# Patient Record
Sex: Female | Born: 1940 | Race: Black or African American | Hispanic: No | Marital: Single | Smoking: Current every day smoker
Health system: Southern US, Community
[De-identification: ages and names within clinical notes are randomized; demographics above are authoritative.]

## PROBLEM LIST (undated history)

## (undated) DIAGNOSIS — N189 Chronic kidney disease, unspecified: Secondary | ICD-10-CM

## (undated) DIAGNOSIS — I10 Essential (primary) hypertension: Secondary | ICD-10-CM

## (undated) DIAGNOSIS — J449 Chronic obstructive pulmonary disease, unspecified: Secondary | ICD-10-CM

## (undated) DIAGNOSIS — I509 Heart failure, unspecified: Secondary | ICD-10-CM

## (undated) DIAGNOSIS — F0390 Unspecified dementia without behavioral disturbance: Secondary | ICD-10-CM

## (undated) DIAGNOSIS — G4700 Insomnia, unspecified: Secondary | ICD-10-CM

## (undated) SURGERY — OPEN REDUCTION INTERNAL FIXATION HIP
Anesthesia: Choice | Laterality: Left

---

## 2000-01-02 ENCOUNTER — Encounter: Payer: Self-pay | Admitting: Orthopedic Surgery

## 2000-01-02 ENCOUNTER — Ambulatory Visit (HOSPITAL_COMMUNITY): Admission: RE | Admit: 2000-01-02 | Discharge: 2000-01-02 | Payer: Self-pay | Admitting: Orthopedic Surgery

## 2000-01-07 ENCOUNTER — Encounter: Payer: Self-pay | Admitting: Orthopedic Surgery

## 2000-01-07 ENCOUNTER — Ambulatory Visit (HOSPITAL_COMMUNITY): Admission: RE | Admit: 2000-01-07 | Discharge: 2000-01-07 | Payer: Self-pay | Admitting: Orthopedic Surgery

## 2000-02-04 ENCOUNTER — Ambulatory Visit (HOSPITAL_BASED_OUTPATIENT_CLINIC_OR_DEPARTMENT_OTHER): Admission: RE | Admit: 2000-02-04 | Discharge: 2000-02-04 | Payer: Self-pay | Admitting: Orthopedic Surgery

## 2000-02-25 ENCOUNTER — Emergency Department (HOSPITAL_COMMUNITY): Admission: EM | Admit: 2000-02-25 | Discharge: 2000-02-25 | Payer: Self-pay | Admitting: Emergency Medicine

## 2000-04-21 ENCOUNTER — Ambulatory Visit (HOSPITAL_BASED_OUTPATIENT_CLINIC_OR_DEPARTMENT_OTHER): Admission: RE | Admit: 2000-04-21 | Discharge: 2000-04-21 | Payer: Self-pay | Admitting: Orthopedic Surgery

## 2000-09-19 ENCOUNTER — Emergency Department (HOSPITAL_COMMUNITY): Admission: EM | Admit: 2000-09-19 | Discharge: 2000-09-19 | Payer: Self-pay | Admitting: Emergency Medicine

## 2001-06-21 ENCOUNTER — Other Ambulatory Visit: Admission: RE | Admit: 2001-06-21 | Discharge: 2001-06-21 | Payer: Self-pay | Admitting: *Deleted

## 2002-03-04 ENCOUNTER — Ambulatory Visit (HOSPITAL_COMMUNITY): Admission: RE | Admit: 2002-03-04 | Discharge: 2002-03-04 | Payer: Self-pay | Admitting: *Deleted

## 2002-03-04 ENCOUNTER — Encounter: Payer: Self-pay | Admitting: *Deleted

## 2003-10-11 ENCOUNTER — Encounter: Admission: RE | Admit: 2003-10-11 | Discharge: 2003-10-11 | Payer: Self-pay | Admitting: Family Medicine

## 2004-01-15 ENCOUNTER — Encounter: Admission: RE | Admit: 2004-01-15 | Discharge: 2004-01-15 | Payer: Self-pay | Admitting: Gastroenterology

## 2004-01-17 ENCOUNTER — Ambulatory Visit (HOSPITAL_COMMUNITY): Admission: RE | Admit: 2004-01-17 | Discharge: 2004-01-17 | Payer: Self-pay | Admitting: Gastroenterology

## 2004-12-12 ENCOUNTER — Ambulatory Visit (HOSPITAL_COMMUNITY): Admission: RE | Admit: 2004-12-12 | Discharge: 2004-12-12 | Payer: Self-pay | Admitting: Gastroenterology

## 2005-01-13 ENCOUNTER — Ambulatory Visit (HOSPITAL_COMMUNITY): Admission: RE | Admit: 2005-01-13 | Discharge: 2005-01-13 | Payer: Self-pay | Admitting: Gastroenterology

## 2005-01-13 ENCOUNTER — Encounter (INDEPENDENT_AMBULATORY_CARE_PROVIDER_SITE_OTHER): Payer: Self-pay | Admitting: *Deleted

## 2009-04-04 ENCOUNTER — Encounter: Admission: RE | Admit: 2009-04-04 | Discharge: 2009-04-04 | Payer: Self-pay | Admitting: Internal Medicine

## 2009-05-12 ENCOUNTER — Emergency Department (HOSPITAL_COMMUNITY): Admission: EM | Admit: 2009-05-12 | Discharge: 2009-05-12 | Payer: Self-pay | Admitting: Emergency Medicine

## 2009-07-08 ENCOUNTER — Emergency Department (HOSPITAL_COMMUNITY): Admission: EM | Admit: 2009-07-08 | Discharge: 2009-07-08 | Payer: Self-pay | Admitting: Emergency Medicine

## 2011-01-09 LAB — CBC
HCT: 40.1 % (ref 36.0–46.0)
Hemoglobin: 13.5 g/dL (ref 12.0–15.0)
MCHC: 33.7 g/dL (ref 30.0–36.0)
MCV: 98.6 fL (ref 78.0–100.0)
Platelets: 228 10*3/uL (ref 150–400)
RBC: 4.07 MIL/uL (ref 3.87–5.11)
RDW: 12.8 % (ref 11.5–15.5)
WBC: 5.2 10*3/uL (ref 4.0–10.5)

## 2011-01-09 LAB — DIFFERENTIAL
Basophils Absolute: 0.1 10*3/uL (ref 0.0–0.1)
Basophils Relative: 1 % (ref 0–1)
Eosinophils Absolute: 0 10*3/uL (ref 0.0–0.7)
Eosinophils Relative: 1 % (ref 0–5)
Lymphocytes Relative: 24 % (ref 12–46)
Lymphs Abs: 1.3 10*3/uL (ref 0.7–4.0)
Monocytes Absolute: 0.5 10*3/uL (ref 0.1–1.0)
Monocytes Relative: 10 % (ref 3–12)
Neutro Abs: 3.3 10*3/uL (ref 1.7–7.7)
Neutrophils Relative %: 65 % (ref 43–77)

## 2011-01-11 LAB — URINALYSIS, ROUTINE W REFLEX MICROSCOPIC
Bilirubin Urine: NEGATIVE
Glucose, UA: NEGATIVE mg/dL
Hgb urine dipstick: NEGATIVE
Ketones, ur: NEGATIVE mg/dL
Nitrite: NEGATIVE
Protein, ur: NEGATIVE mg/dL
Specific Gravity, Urine: 1.017 (ref 1.005–1.030)
Urobilinogen, UA: 0.2 mg/dL (ref 0.0–1.0)
pH: 6 (ref 5.0–8.0)

## 2011-01-11 LAB — BASIC METABOLIC PANEL
BUN: 14 mg/dL (ref 6–23)
CO2: 25 mEq/L (ref 19–32)
Calcium: 9.4 mg/dL (ref 8.4–10.5)
Chloride: 105 mEq/L (ref 96–112)
Creatinine, Ser: 0.98 mg/dL (ref 0.4–1.2)
GFR calc Af Amer: >60 mL/min (ref >60)
GFR calc non Af Amer: 56 mL/min — ABNORMAL LOW (ref >60)
Glucose, Bld: 91 mg/dL (ref 70–99)
Potassium: 4.2 mEq/L (ref 3.5–5.1)
Sodium: 138 mEq/L (ref 135–145)

## 2011-01-11 LAB — DIFFERENTIAL
Basophils Absolute: 0.1 10*3/uL (ref 0.0–0.1)
Basophils Relative: 1 % (ref 0–1)
Eosinophils Absolute: 0.1 10*3/uL (ref 0.0–0.7)
Eosinophils Relative: 1 % (ref 0–5)
Lymphocytes Relative: 22 % (ref 12–46)
Lymphs Abs: 1.4 10*3/uL (ref 0.7–4.0)
Monocytes Absolute: 0.5 10*3/uL (ref 0.1–1.0)
Monocytes Relative: 8 % (ref 3–12)
Neutro Abs: 4.4 10*3/uL (ref 1.7–7.7)
Neutrophils Relative %: 69 % (ref 43–77)

## 2011-01-11 LAB — CBC
HCT: 35.6 % — ABNORMAL LOW (ref 36.0–46.0)
Hemoglobin: 11.8 g/dL — ABNORMAL LOW (ref 12.0–15.0)
MCHC: 33.2 g/dL (ref 30.0–36.0)
MCV: 97.3 fL (ref 78.0–100.0)
Platelets: 259 10*3/uL (ref 150–400)
RBC: 3.66 MIL/uL — ABNORMAL LOW (ref 3.87–5.11)
RDW: 14.1 % (ref 11.5–15.5)
WBC: 6.4 10*3/uL (ref 4.0–10.5)

## 2011-01-11 LAB — SAMPLE TO BLOOD BANK

## 2011-01-11 LAB — HEMOCCULT GUIAC POC 1CARD (OFFICE): Fecal Occult Bld: POSITIVE

## 2011-02-21 NOTE — Op Note (Signed)
[place]  Patient:    Tammy [name], Tammy [name]                    MRN: [id number] Proc. Date: 02/04/00 Adm. Date:  [id number] Disc. Date: [id number] Attending:  Drema Pry                           Operative Report  PREOPERATIVE DIAGNOSIS:  Loss of syndesmosis reduction with mortis widening, status post tightening of mortis on January 07, 2000.  POSTOPERATIVE DIAGNOSIS: Loss of syndesmosis reduction with mortis widening, status post tightening of mortis on January 07, 2000.  OPERATION:  Replacement of cortical syndesmotic screw with reduction of mortis and placement of 4.5 cannulated cancellus screw through four cortices.  SURGEON:  Jearld Adjutant, M.D.  ASSISTANT:  Currie Paris. Thedore Mins.  ANESTHESIA:   General with LMA  CULTURES:  None.  DRAINS:  None.  ESTIMATED BLOOD LOSS:  Minimal  TOURNIQUET TIME:  35 minutes  PATHOLOGIC FINDINGS AND HISTORY:  [place] a trimalleolar ankle fracture with open reduction and internal fixation of the lateral malleolus with a transient osmotic screw done on December 19, 1999 in [place]. We see came to [place] it was apparent that the mortis was not reduced.  CT scan was obtained showing a bone fragment between the distal fibular fracture fragment and the tibia and some bone fragments between the medial talus and the medial malleolus.  We therefore took her back to the operating room here in [place] where she lives and is an old patient of mine on January 07, 2000 where we explored the distal tibia fibular region removing the bone fragment between the tibia and the fibula and also explored medially where the deltoid ligament fragments were, removed several bony fragments medially and some infolded deltoid ligament and then repaired the deltoid ligament back to the medial malleolus with a suture anchor.  This reconstituted the mortis.  In the interim, despite our strong requirement that the patient be nonweight  bearing with a cam walker, she did walk on it and the resultant problem was that the syndesmosis screw loosened and the mortis widened again. Therefore, we elected to take her back one more time to replace the tricortically cortical screw that we had not changed the last time to a 4.5 cannulated cancellus screw through the medial cortex of the tibia and to reconstitute the mortis.  This was what was done today with a 4.5 cannulated cancellus screw through the medial cortex.  When we maximally dorsiflexed the ankle, we did the reduction under C arm fluoroscopy OEC type and felt that we had closed it down.  On the pure AP view there still appeared to be some fragment medially, not apparent on the mortis view.  We had explored this recently and because of our good position on the mortis view we elected to proceed with this reduction and not to re-explore medially and accept this. We also decided to place her in a short leg fiberglass cast to further give good contact. We further admonished her to be nonweight bearing until this heals adequately.  DESCRIPTION OF PROCEDURE:  With adequate anesthesia obtained, using LMA technique, 1 gram Ancef given IV prophylaxis, the patient was placed in the supine position. The left foot was prepped from the toes to the knee in a standard fashion.  After standard prepping and draping, Esmarch exsanguination was used and the tourniquet was  let up to 350 mmHg.  Then under C arm control we made an incision right over the syndesmosis screw and dissected down to it. That screw was removed and then the guide wire was used to go across four cortices out the medial side as well as a drill. We then placed a 58 mm screw across and under C arm fluoroscopy with maximal dorsiflexion felt that we had reclosed the mortis.  We decided not to over tighten to strip it and it did not protrude medially significantly to cause any internal decubitus.  We checked it on the lateral  view to make sure it was in both bones.  When I was satisfied, this wound was irrigated, closed with 3-0 Vicryl and skin staples. Bulky sterile compressive dressing was applied with a short leg fiberglass cast in neutral position.  The patient then having tolerated the procedure well, was awakened, taken to the recovery room in satisfactory condition to be discharged per outpatient routine. Crutches, nonweight bearing on the right. Told to call the office for appointment for recheck on Monday. DD:  02/04/00 TD:  02/06/00 Job: [id number]

## 2011-02-21 NOTE — Op Note (Signed)
[place]  Patient:    Tammy [name], Tammy [name]                    MRN: [id number] Proc. Date: 01/07/00 Adm. Date:  [id number] Attending:  Drema Pry                           Operative Report  PREOPERATIVE DIAGNOSIS:  Status post open reduction internal fixation of bimalleolar component of trimalleolar fracture, right ankle, in [place] with failure of complete mortise reduction.  POSTOPERATIVE DIAGNOSIS:  Status post open reduction internal fixation of bimalleolar component of trimalleolar fracture, right ankle, in [place] with failure of complete mortise reduction.  PROCEDURE: 1. Explore and replace right lateral ankle syndesmosis screw and removal of    interpose bone fragment between tibia and fibula. 2. Explore medial deltoid ligament and removal of enfold of deltoid    ligament, removal of bone fragment, and repair of deltoid with Mitek    anchor.  SURGEON:  Jearld Adjutant, M.D.  ASSISTANT:  Currie Paris. Thedore Mins.  ANESTHESIA:  General with endotracheal.  CULTURES:  None.  DRAINS:  None.  ESTIMATED BLOOD LOSS:  Minimal.  TOURNIQUET TIME:  One hour, 30 minutes.  PATHOLOGIC FINDINGS AND HISTORY:  [name] is a 70 year old female who sustained a closed trimalleolar Pott-type fracture, right ankle, about two-and-a-half weeks ago.  This was in [place].  She was taken to the operating room where she underwent open reduction internal fixation with a front-to-back interfragmentary screw at the fracture site laterally, a seven-hole contoured semi-tubular plate laterally, and a syndesmosis screw. The medial side had some bony fragments but was not explored.  Ultimately, reduction was obtained and accepted, and it was felt that anatomic reduction had been obtained.  She was sent to me and came back home for followup, her being a long-term patient of mine.  I analyzed the ankle and was not happy with the mortise reduction of the talus  to the medial post to the medial malleolus.  There appeared to be a possible interpose bony fragment medially, and therefore, we got a CT scan.  What it revealed was a bone fragment holding out the lateral malleolus from closedown at the syndesmosis as well as probable interpose medial malleolar deltoid and bone fragment.  Therefore, she was taken back to the operating room after much thought and discussion, and we found the bony fragment between the tibia and fibula, removed that, and then squeezed down further, replacing the syndesmosis screw after removing several screws up and down just to get the plate open enough to remove the bone fragment and then replace the syndesmosis screw that we had taken out, tighten it up, and still couldnt get it over medially.  We then made an incision medially, took out end folded deltoid and the bone fragment, and then were able to squeeze down with the syndesmosis and reduced the mortise, bringing the talus to the medial malleolus with anatomic reduction obtained.  We then sutured the deltoid ligament down which was very mushy and friable with a Mitek anchor.  PROCEDURE:  With adequate anesthesia obtained using endotracheal technique, one gram Ancef was given IV prophylaxis and another one at letdown.  The patient was placed in the supine position.  The right lower extremity was prepped from the toes to the knee in a standard fashion.  After standard prepping and draping, Esmarch exsanguination was used.  The  tourniquet was blown up to 350 mmHg and later 400 mmHg.  The old lateral incision was then opened in its mid portion about 5-6 cm.  The incision was deep and sharply divided.  Hemostasis was obtained using the Bovie electrocoagulator. Dissection was carried down to the plate where the syndesmosis screw was removed, two distal screws, and one more proximal.  We then bent the plate up, removed the offending bony fragment that we found, placed the  screw back, and tightened it down, still not reducing the talus completely to the medial malleolus.  We then made a longitudinal incision medially, dissected down to bone, made a longitudinal incision into the deltoid ligament and fished out the bony fragment and some enfolded deltoid down to the medial talus.  Once we had done this, we then went laterally and tightened down the syndesmosis screw again and were able to get the mortise closed anatomically.  C-arm fluoroscopy confirmed positioning.  We did this in ankle dorsiflexion.  Then to repair the deltoid, I used a Mitek anchor and sutured the ligament down.  The lateral plate screws had been replaced by this time, before the closure of the mortise.  We then closed the wound in layers with 2-0 and 3-0 Vicryl after repairing the deltoid ligament with the Mitek anchor and #2 Ethibond.  This was a SuperAnchor Mitek with four prongs.  We then confirmed our position on C-arm with mortise and lateral x-rays and C-arm films, and then closed the wound layers with 2-0 and 3-0 Vicryl on the subcu and 4-0 nylon.  Bulky sterile compressive dressings were applied with posterior splint and new stirrup splint with a mold of the tibia lateral and the lateral border of the foot medial to further act to compress the area.  The patients ankle neutral was placed.  The patient, having tolerated the procedure well, was awakened and taken to the recovery room in satisfactory condition for routine postoperative care. DD:  01/07/00 TD:  01/07/00 Job: [id number]

## 2011-02-21 NOTE — Op Note (Signed)
NAME:  [name], Tammy T                       ACCOUNT NO.:  [id number]   MEDICAL RECORD NO.:  [id number]                   PATIENT TYPE:  AMB   LOCATION:  ENDO                                 FACILITY:  [facility]   PHYSICIAN:  Anselmo Rod, M.D.               DATE OF BIRTH:  01-14-1941   DATE OF PROCEDURE:  01/18/2004  DATE OF DISCHARGE:                                 OPERATIVE REPORT   PROCEDURE PERFORMED:  Colonoscopy.   ENDOSCOPIST:  Anselmo Rod, M.D.   INSTRUMENT USED:  Olympus video colonoscope.   INDICATION FOR PROCEDURE:  A 70 year old African-American female with a  history of rectal bleeding.  Rule out colonic polyps, masses, etc.   PREPROCEDURE PREPARATION:  Informed consent was procured from the patient.  The patient was fasted for eight hours prior to the procedure and prepped  with a bottle of magnesium citrate and a gallon of GoLYTELY the night prior  to the procedure.   PREPROCEDURE PHYSICAL:  VITAL SIGNS:  The patient had stable vital signs.  NECK:  Supple.  CHEST:  Clear to auscultation.  S1, S2 regular.  ABDOMEN:  Soft with normal bowel sounds.   DESCRIPTION OF PROCEDURE:  The patient was placed in the left lateral  decubitus position and sedated with 75 mg of Demerol and 9 mg of Versed  intravenously in slow incremental doses.  Once the patient was adequately  sedate and maintained on low-flow oxygen and continuous cardiac monitoring,  the Olympus video colonoscope was advanced from the rectum to the cecum.  The appendiceal orifice and the ileocecal valve were clearly visualized and  photographed.  The patient had prominent internal hemorrhoids.  There was a  lipoma seen in the mid-right colon.  The rest of the colonic mucosa appeared  healthy, and so did the terminal ileum.  The patient tolerated the procedure  well without immediate complications.   IMPRESSION:  Normal colonoscopy up to the terminal ileum except for a lipoma  in the mid-right colon  and prominent internal hemorrhoids.   RECOMMENDATIONS:  1. Surgical referral is planned for the patient.  2. Continue a high-fiber diet with liberal fluid intake.  3. Outpatient follow-up in the next few weeks for further recommendations.                                               Anselmo Rod, M.D.    JNM/MEDQ  D:  01/18/2004  T:  01/19/2004  Job:  [id number]   cc:   Merlene Laughter. Renae Gloss, M.D.  [address]  [address]  [place]  [place] [address]  Fax: [phone]

## 2011-02-21 NOTE — Op Note (Signed)
NAME:  Tammy [name], Tammy [name]             ACCOUNT NO.:  [id number]   MEDICAL RECORD NO.:  [id number]          PATIENT TYPE:  AMB   LOCATION:  ENDO                         FACILITY:  [facility]   PHYSICIAN:  Anselmo Rod, M.D.  DATE OF BIRTH:  [birth year]   DATE OF PROCEDURE:  01/13/2005  DATE OF DISCHARGE:                                 OPERATIVE REPORT   PROCEDURE PERFORMED:  Colonoscopy with snare polypectomy x 1 and cold  biopsies x 8.   ENDOSCOPIST:  Anselmo Rod, M.D.   INSTRUMENT USED:  Olympus video colonoscope.   INDICATIONS FOR PROCEDURE:  A 70 year old, African-American female with  history rectal bleeding.  Rule out colonic polyps, masses, etc.  Polyps were  seen on a flexible sigmoidoscopy done in the recent past (December 12, 2004).   PRE-PROCEDURE PREPARATION:  Informed consent was procured from the patient.  The patient was fasted for eight hours prior to the procedure and prepped  with a bottle of magnesium citrate and a gallon of GoLYTELY the night prior  to the procedure.  The risks and benefits of the procedure including a 10%  miss rate of cancer and polyps were discussed with her as well.   PRE-PROCEDURE PHYSICAL:  VITAL SIGNS:  The patient had stable vital signs.  NECK:  Supple.  CHEST:  Clear to auscultation.  CARDIOVASCULAR:  S1, S2 regular.  ABDOMEN:  Soft, with normal bowel sounds.   DESCRIPTION OF THE PROCEDURE:  The patient was placed in the left lateral  decubitus position, sedated with 80 mg of Demerol and 8 mg of Versed in slow  incremental doses.  Once the patient was adequately sedated and maintained  on low-flow oxygen and continuous cardiac monitoring, the Olympus video  colonoscope was advanced from the rectum to the cecum.  There was some  residual stool in the colon.  Multiple washings were done.  Prominent  internal hemorrhoids were seen on retroflexion.  A flat polyp was snared  from the rectosigmoid colon.  Five small sessile polyps were  biopsied from  the rectosigmoid colon (cold biopsies).  A small sessile polyp was biopsied  (cold biopsy) from the proximal right colon.  A submucosal yellowish lesion  consistent with a lipoma was seen in the proximal right colon as well.  This  was not biopsied or removed.  The patient tolerated the procedure well,  without immediate complications.   IMPRESSION:  1.  Prominent internal hemorrhoid.  2.  Multiple colonic polyps (see description above), one removed by snare      polypectomy, the rest were done by cold biopsies.  3.  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   RECOMMENDATIONS:  1.  Await pathology results.  2.  Avoid nonsteroidals including aspirin for the next four weeks.  3.  Outpatient followup in the next two weeks for further recommendations.      JNM/MEDQ  D:  01/13/2005  T:  01/13/2005  Job:  [id number]   cc:   Merlene Laughter. Renae Gloss, M.D.  [address]  [address]  [place]  [place] [address]  Fax: [phone]

## 2011-06-20 ENCOUNTER — Emergency Department (HOSPITAL_COMMUNITY)
Admission: EM | Admit: 2011-06-20 | Discharge: 2011-06-21 | Disposition: A | Discharge status: Home / Self Care | Payer: Medicare Other | Attending: Emergency Medicine | Admitting: Emergency Medicine

## 2011-06-20 ENCOUNTER — Emergency Department (HOSPITAL_COMMUNITY): Payer: Medicare Other

## 2011-06-20 DIAGNOSIS — R42 Dizziness and giddiness: Secondary | ICD-10-CM | POA: Insufficient documentation

## 2011-06-20 DIAGNOSIS — G4700 Insomnia, unspecified: Secondary | ICD-10-CM | POA: Insufficient documentation

## 2011-06-20 DIAGNOSIS — R279 Unspecified lack of coordination: Secondary | ICD-10-CM | POA: Insufficient documentation

## 2011-06-20 DIAGNOSIS — F411 Generalized anxiety disorder: Secondary | ICD-10-CM | POA: Insufficient documentation

## 2011-06-20 LAB — BASIC METABOLIC PANEL
BUN: 5 mg/dL — ABNORMAL LOW (ref 6–23)
CO2: 24 mEq/L (ref 19–32)
Calcium: 9.8 mg/dL (ref 8.4–10.5)
Chloride: 95 mEq/L — ABNORMAL LOW (ref 96–112)
Creatinine, Ser: 0.74 mg/dL (ref 0.50–1.10)
GFR calc Af Amer: >60 mL/min (ref >60)
GFR calc non Af Amer: >60 mL/min (ref >60)
Glucose, Bld: 127 mg/dL — ABNORMAL HIGH (ref 70–99)
Potassium: 3.6 mEq/L (ref 3.5–5.1)
Sodium: 131 mEq/L — ABNORMAL LOW (ref 135–145)

## 2011-06-20 LAB — RAPID URINE DRUG SCREEN, HOSP PERFORMED
Amphetamines: NOT DETECTED
Barbiturates: NOT DETECTED
Benzodiazepines: NOT DETECTED
Cocaine: NOT DETECTED
Opiates: NOT DETECTED
Tetrahydrocannabinol: NOT DETECTED

## 2011-06-20 LAB — CBC
HCT: 39 % (ref 36.0–46.0)
Hemoglobin: 13.8 g/dL (ref 12.0–15.0)
MCH: 30.9 pg (ref 26.0–34.0)
MCHC: 35.4 g/dL (ref 30.0–36.0)
MCV: 87.4 fL (ref 78.0–100.0)
Platelets: 286 10*3/uL (ref 150–400)
RBC: 4.46 MIL/uL (ref 3.87–5.11)
RDW: 12.1 % (ref 11.5–15.5)
WBC: 7.3 10*3/uL (ref 4.0–10.5)

## 2011-06-20 LAB — DIFFERENTIAL
Basophils Absolute: 0 10*3/uL (ref 0.0–0.1)
Basophils Relative: 0 % (ref 0–1)
Eosinophils Absolute: 0.1 10*3/uL (ref 0.0–0.7)
Eosinophils Relative: 1 % (ref 0–5)
Lymphocytes Relative: 33 % (ref 12–46)
Lymphs Abs: 2.4 10*3/uL (ref 0.7–4.0)
Monocytes Absolute: 0.8 10*3/uL (ref 0.1–1.0)
Monocytes Relative: 11 % (ref 3–12)
Neutro Abs: 4 10*3/uL (ref 1.7–7.7)
Neutrophils Relative %: 55 % (ref 43–77)

## 2011-06-20 LAB — URINALYSIS, ROUTINE W REFLEX MICROSCOPIC
Bilirubin Urine: NEGATIVE
Glucose, UA: NEGATIVE mg/dL
Hgb urine dipstick: NEGATIVE
Ketones, ur: NEGATIVE mg/dL
Leukocytes, UA: NEGATIVE
Nitrite: NEGATIVE
Protein, ur: NEGATIVE mg/dL
Specific Gravity, Urine: 1.006 (ref 1.005–1.030)
Urobilinogen, UA: 0.2 mg/dL (ref 0.0–1.0)
pH: 7 (ref 5.0–8.0)

## 2011-06-27 ENCOUNTER — Emergency Department (HOSPITAL_COMMUNITY)
Admission: EM | Admit: 2011-06-27 | Discharge: 2011-06-27 | Disposition: A | Discharge status: Home / Self Care | Payer: Medicare Other | Attending: Emergency Medicine | Admitting: Emergency Medicine

## 2011-06-27 ENCOUNTER — Emergency Department (HOSPITAL_COMMUNITY): Payer: Medicare Other

## 2011-06-27 DIAGNOSIS — G4700 Insomnia, unspecified: Secondary | ICD-10-CM | POA: Insufficient documentation

## 2011-06-27 DIAGNOSIS — F411 Generalized anxiety disorder: Secondary | ICD-10-CM | POA: Insufficient documentation

## 2011-06-27 DIAGNOSIS — R42 Dizziness and giddiness: Secondary | ICD-10-CM | POA: Insufficient documentation

## 2011-06-27 DIAGNOSIS — I10 Essential (primary) hypertension: Secondary | ICD-10-CM | POA: Insufficient documentation

## 2011-06-27 LAB — URINALYSIS, ROUTINE W REFLEX MICROSCOPIC
Bilirubin Urine: NEGATIVE
Glucose, UA: NEGATIVE mg/dL
Hgb urine dipstick: NEGATIVE
Ketones, ur: NEGATIVE mg/dL
Nitrite: NEGATIVE
Protein, ur: NEGATIVE mg/dL
Specific Gravity, Urine: 1.003 — ABNORMAL LOW (ref 1.005–1.030)
Urobilinogen, UA: 0.2 mg/dL (ref 0.0–1.0)
pH: 7 (ref 5.0–8.0)

## 2011-06-27 LAB — POCT I-STAT, CHEM 8
BUN: 9 mg/dL (ref 6–23)
Calcium, Ion: 1.21 mmol/L (ref 1.12–1.32)
Chloride: 102 mEq/L (ref 96–112)
Creatinine, Ser: 0.8 mg/dL (ref 0.50–1.10)
Glucose, Bld: 89 mg/dL (ref 70–99)
HCT: 44 % (ref 36.0–46.0)
Hemoglobin: 15 g/dL (ref 12.0–15.0)
Potassium: 4.7 mEq/L (ref 3.5–5.1)
Sodium: 135 mEq/L (ref 135–145)
TCO2: 24 mmol/L (ref 0–100)

## 2011-06-27 LAB — URINE MICROSCOPIC-ADD ON

## 2011-06-27 LAB — DIFFERENTIAL
Basophils Absolute: 0 10*3/uL (ref 0.0–0.1)
Basophils Relative: 1 % (ref 0–1)
Eosinophils Absolute: 0 10*3/uL (ref 0.0–0.7)
Eosinophils Relative: 1 % (ref 0–5)
Lymphocytes Relative: 32 % (ref 12–46)
Lymphs Abs: 1.9 10*3/uL (ref 0.7–4.0)
Monocytes Absolute: 0.5 10*3/uL (ref 0.1–1.0)
Monocytes Relative: 8 % (ref 3–12)
Neutro Abs: 3.4 10*3/uL (ref 1.7–7.7)
Neutrophils Relative %: 58 % (ref 43–77)

## 2011-06-27 LAB — CBC
HCT: 39.7 % (ref 36.0–46.0)
Hemoglobin: 13.5 g/dL (ref 12.0–15.0)
MCH: 30.3 pg (ref 26.0–34.0)
MCHC: 34 g/dL (ref 30.0–36.0)
MCV: 89 fL (ref 78.0–100.0)
Platelets: 257 10*3/uL (ref 150–400)
RBC: 4.46 MIL/uL (ref 3.87–5.11)
RDW: 12.3 % (ref 11.5–15.5)
WBC: 5.8 10*3/uL (ref 4.0–10.5)

## 2015-01-06 ENCOUNTER — Encounter (HOSPITAL_COMMUNITY): Payer: Self-pay | Admitting: *Deleted

## 2015-01-06 ENCOUNTER — Emergency Department (HOSPITAL_COMMUNITY): Payer: Medicare Other

## 2015-01-06 ENCOUNTER — Emergency Department (HOSPITAL_COMMUNITY)
Admission: EM | Admit: 2015-01-06 | Discharge: 2015-01-06 | Disposition: A | Discharge status: Home / Self Care | Payer: Medicare Other | Attending: Emergency Medicine | Admitting: Emergency Medicine

## 2015-01-06 DIAGNOSIS — R05 Cough: Secondary | ICD-10-CM | POA: Insufficient documentation

## 2015-01-06 DIAGNOSIS — Z720 Tobacco use: Secondary | ICD-10-CM | POA: Diagnosis not present

## 2015-01-06 DIAGNOSIS — Z8669 Personal history of other diseases of the nervous system and sense organs: Secondary | ICD-10-CM | POA: Diagnosis not present

## 2015-01-06 DIAGNOSIS — R410 Disorientation, unspecified: Secondary | ICD-10-CM | POA: Diagnosis not present

## 2015-01-06 DIAGNOSIS — R059 Cough, unspecified: Secondary | ICD-10-CM

## 2015-01-06 HISTORY — DX: Insomnia, unspecified: G47.00

## 2015-01-06 LAB — CBC WITH DIFFERENTIAL/PLATELET
Basophils Absolute: 0 10*3/uL (ref 0.0–0.1)
Basophils Relative: 1 % (ref 0–1)
Eosinophils Absolute: 0.1 10*3/uL (ref 0.0–0.7)
Eosinophils Relative: 2 % (ref 0–5)
HCT: 32.3 % — ABNORMAL LOW (ref 36.0–46.0)
Hemoglobin: 10.8 g/dL — ABNORMAL LOW (ref 12.0–15.0)
Lymphocytes Relative: 27 % (ref 12–46)
Lymphs Abs: 0.9 10*3/uL (ref 0.7–4.0)
MCH: 30.4 pg (ref 26.0–34.0)
MCHC: 33.4 g/dL (ref 30.0–36.0)
MCV: 91 fL (ref 78.0–100.0)
Monocytes Absolute: 0.4 10*3/uL (ref 0.1–1.0)
Monocytes Relative: 13 % — ABNORMAL HIGH (ref 3–12)
Neutro Abs: 1.9 10*3/uL (ref 1.7–7.7)
Neutrophils Relative %: 57 % (ref 43–77)
Platelets: 196 10*3/uL (ref 150–400)
RBC: 3.55 MIL/uL — ABNORMAL LOW (ref 3.87–5.11)
RDW: 12.4 % (ref 11.5–15.5)
WBC: 3.2 10*3/uL — ABNORMAL LOW (ref 4.0–10.5)

## 2015-01-06 LAB — BASIC METABOLIC PANEL
Anion gap: 8 (ref 5–15)
BUN: 11 mg/dL (ref 6–23)
CO2: 24 mmol/L (ref 19–32)
Calcium: 9.3 mg/dL (ref 8.4–10.5)
Chloride: 104 mmol/L (ref 96–112)
Creatinine, Ser: 1.01 mg/dL (ref 0.50–1.10)
GFR calc Af Amer: 62 mL/min — ABNORMAL LOW (ref >90)
GFR calc non Af Amer: 53 mL/min — ABNORMAL LOW (ref >90)
Glucose, Bld: 131 mg/dL — ABNORMAL HIGH (ref 70–99)
Potassium: 3.8 mmol/L (ref 3.5–5.1)
Sodium: 136 mmol/L (ref 135–145)

## 2015-01-06 LAB — I-STAT TROPONIN, ED: Troponin i, poc: 0 ng/mL (ref 0.00–0.08)

## 2015-01-06 NOTE — Discharge Instructions (Signed)
Work up today was normal. Someone will be contacting you with the next 24-48 hours to set up at home evaluation for home health needs. Please follow-up with your primary care physician. Return to the ED for new concerns.

## 2015-01-06 NOTE — ED Notes (Signed)
Pt states "I can't give you" a urine sample.

## 2015-01-06 NOTE — Progress Notes (Addendum)
CSW met with this 74 y/o, widowed, African-American, female that presents in hospital garb with a agitated affect.  Patient states that, "There is nothing wrong with me, I want to go home."  The Attending states that physically she is fine, however the patient's son is concerned about her.  Patient's husband passed approximately 1 year ago and had dementia and the son states she is having early dementia symptoms as well now.  Patient is reported to be missing meals, forgetful, repeating self, reduced activities.   CSW discussed with medical staff and with the son that a home health evaluation could be set up for her and the son asked if there were additional resources to hire a per diem sitter.  Patient's son gave this CSW his contact information and stated he needed to get her home and get back to work.  CSW will give the Nurse CM his contact information in case he has additional needs or concerns.  Patient's son thanked CSW for assistance.  Son: Venora Maples [place] Richardo Priest ED CSW [phone]

## 2015-01-06 NOTE — ED Notes (Signed)
Pt has no complaints. States she is only here bc her son thinks she has alzheimers. Pt reports living at home alone and denies any confusion or complaints. No acute distress noted at triage.

## 2015-01-06 NOTE — ED Provider Notes (Signed)
CSN: [id number]     Arrival date & time 01/06/15  5809 History   First MD Initiated Contact with Patient 01/06/15 0730     Chief Complaint  Patient presents with  . Altered Mental Status     (Consider location/radiation/quality/duration/timing/severity/associated sxs/prior Treatment) Patient is a 74 y.o. female presenting with altered mental status. The history is provided by the patient and medical records.  Altered Mental Status   This is a 74 year old female with past medical history significant for insomnia, presenting to the ED at the request of her son.  Patient denies any specific complaints, states "my son just thinks i'm crazy".  Per patient's son he has noticed a steady decline in her mental status and level of functioning since his father passed away. He states it is gotten worse over the past month. He states she currently lives alone and he is having to go to her home every day to do simple tasks such as turn on the TV and wash dishes. He is not sure if she is just forgetful or if she is confused.  He states she has lost a lot of weight recently and has started smoking more than normal, some days she smokes up to 5 packs a day and has a horrific cough.  Patient states "i don't cook often because it's just me at home so I dont want to make a big meal but i do eat."  No recent illness, fever, or chills.  Son states that he witnessed his father going through dementia prior to his passing and he is concerned his mother is in the early stages of dementia as well.  He states he feels that she needs some help at home during the day as he cannot continue staying out of work.  Past Medical History  Diagnosis Date  . Insomnia    History reviewed. No pertinent past surgical history. History reviewed. No pertinent family history. History  Substance Use Topics  . Smoking status: Current Every Day Smoker    Types: Cigarettes  . Smokeless tobacco: Not on file  . Alcohol Use: No   OB History     No data available     Review of Systems  Neurological:       ?dementia  All other systems reviewed and are negative.     Allergies  Review of patient's allergies indicates no known allergies.  Home Medications   Prior to Admission medications   Not on File   BP 144/69 mmHg  Pulse 95  Temp(Src) 98 F (36.7 C) (Oral)  Resp 18  SpO2 100%   Physical Exam  Constitutional: She is oriented to person, place, and time. She appears well-developed and well-nourished. No distress.  Thin, elderly  HENT:  Head: Normocephalic and atraumatic.  Mouth/Throat: Oropharynx is clear and moist.  Eyes: Conjunctivae and EOM are normal. Pupils are equal, round, and reactive to light.  Neck: Normal range of motion. Neck supple.  Cardiovascular: Normal rate, regular rhythm and normal heart sounds.   Pulmonary/Chest: Effort normal and breath sounds normal. No respiratory distress. She has no wheezes.  Abdominal: Soft. Bowel sounds are normal. There is no tenderness. There is no guarding.  Musculoskeletal: Normal range of motion.  Neurological: She is alert and oriented to person, place, and time.  AAOx3, answering questions appropriately; equal strength UE and LE bilaterally; CN grossly intact; moves all extremities appropriately without ataxia; no focal neuro deficits or facial asymmetry appreciated  Skin: Skin is warm and dry.  She is not diaphoretic.  Psychiatric: She has a normal mood and affect.  Nursing note and vitals reviewed.   ED Course  Procedures (including critical care time) Labs Review Labs Reviewed  CBC WITH DIFFERENTIAL/PLATELET - Abnormal; Notable for the following:    WBC 3.2 (*)    RBC 3.55 (*)    Hemoglobin 10.8 (*)    HCT 32.3 (*)    Monocytes Relative 13 (*)    All other components within normal limits  BASIC METABOLIC PANEL - Abnormal; Notable for the following:    Glucose, Bld 131 (*)    GFR calc non Af Amer 53 (*)    GFR calc Af Amer 62 (*)    All other  components within normal limits  URINALYSIS, ROUTINE W REFLEX MICROSCOPIC  I-STAT TROPOININ, ED    Imaging Review Dg Chest 2 View  01/06/2015   CLINICAL DATA:  Cough.  COPD.  EXAM: CHEST  2 VIEW  COMPARISON:  07/05/2009  FINDINGS: Heart size in is within normal limits. Pulmonary hyperinflation again seen, consistent with COPD. Both lungs are clear. Incidental note is made of nipple shadows overlying both lung bases on the frontal projection. No evidence of pleural effusion. No mass or lymphadenopathy identified.  IMPRESSION: COPD.  No active disease.   Electronically Signed   By: Earle Gell M.D.   On: 01/06/2015 09:13     EKG Interpretation None      MDM   Final diagnoses:  Cough  Intermittent confusion   74 year old female brought in by son with concern for early stages of dementia. Patient has no current complaints, she states "my son thinks I'm crazy". Son notes a steady decline over the past year since the passing of his father. He states he has been having to take off work to attend her during the day and feels that she may need some help.  On exam, patient does not appear altered.  She is awake, alert and completely oriented to her surroundings.  She is following commands and answering questions appropriately.  She maintains a steady gait independently.  I have performed MMSE on patient, she scored 27/30.  Her work-up here is unremarkable for acute pathology.  Social work and case management has met with patient, will scheduled face-to-face eval for home-health needs.  Son is agreeable with this and plan to discharge home for now.  FU with PCP.  Discussed plan with patient, he/she acknowledged understanding and agreed with plan of care.  Return precautions given for new or worsening symptoms.  Larene Pickett, PA-C 01/06/15 [place], MD 01/06/15 1556

## 2015-01-08 NOTE — Care Management (Addendum)
ED case manager consulted by ED CSW, [facility] for confusion and weight loss of more than 10 pounds in one month as per son. Patient lives alone in a single-family house. As per patient's son she is slightly unsteady but refuses to use assistive walking devices. Tammy [name] PCP. Discussed home health services RN and PT patient and family are agreeable. Offered choice patient and family selected [facility]. [facility] Home health services explain patient and family verbalized understanding. Also discussed [facility], thought patient may benefit from services as well [facility] Patient and family agreeable. [facility] -48 hours. Patient agreeable. Discussed with L. Rickey BarbaraSanders PA-C in the ED she is also agreeable with disposition plan. No further ED CM needs identified.

## 2015-01-08 NOTE — Progress Notes (Signed)
[facility]  Patient declined all HH services once home.   Tammy BurgerStephanie M George 01/08/2015, 12:46 PM

## 2015-02-28 ENCOUNTER — Emergency Department (HOSPITAL_COMMUNITY): Payer: Medicare Other

## 2015-02-28 ENCOUNTER — Encounter (HOSPITAL_COMMUNITY): Payer: Self-pay | Admitting: Emergency Medicine

## 2015-02-28 ENCOUNTER — Inpatient Hospital Stay (HOSPITAL_COMMUNITY): Payer: Medicare Other

## 2015-02-28 ENCOUNTER — Inpatient Hospital Stay (HOSPITAL_COMMUNITY)
Admission: EM | Admit: 2015-02-28 | Discharge: 2015-03-04 | DRG: 061 | Disposition: A | Discharge status: Skilled Nursing Facility | Payer: Medicare Other | Attending: Neurology | Admitting: Neurology

## 2015-02-28 DIAGNOSIS — I672 Cerebral atherosclerosis: Secondary | ICD-10-CM | POA: Diagnosis present

## 2015-02-28 DIAGNOSIS — R278 Other lack of coordination: Secondary | ICD-10-CM | POA: Diagnosis present

## 2015-02-28 DIAGNOSIS — R4182 Altered mental status, unspecified: Secondary | ICD-10-CM | POA: Diagnosis present

## 2015-02-28 DIAGNOSIS — F0390 Unspecified dementia without behavioral disturbance: Secondary | ICD-10-CM | POA: Diagnosis present

## 2015-02-28 DIAGNOSIS — I639 Cerebral infarction, unspecified: Secondary | ICD-10-CM

## 2015-02-28 DIAGNOSIS — Z8249 Family history of ischemic heart disease and other diseases of the circulatory system: Secondary | ICD-10-CM | POA: Diagnosis not present

## 2015-02-28 DIAGNOSIS — Z79899 Other long term (current) drug therapy: Secondary | ICD-10-CM | POA: Diagnosis not present

## 2015-02-28 DIAGNOSIS — R000 Tachycardia, unspecified: Secondary | ICD-10-CM | POA: Diagnosis present

## 2015-02-28 DIAGNOSIS — G4700 Insomnia, unspecified: Secondary | ICD-10-CM | POA: Diagnosis present

## 2015-02-28 DIAGNOSIS — E43 Unspecified severe protein-calorie malnutrition: Secondary | ICD-10-CM | POA: Diagnosis not present

## 2015-02-28 DIAGNOSIS — I420 Dilated cardiomyopathy: Secondary | ICD-10-CM | POA: Diagnosis present

## 2015-02-28 DIAGNOSIS — Z681 Body mass index (BMI) 19 or less, adult: Secondary | ICD-10-CM | POA: Diagnosis not present

## 2015-02-28 DIAGNOSIS — R4701 Aphasia: Secondary | ICD-10-CM | POA: Diagnosis present

## 2015-02-28 DIAGNOSIS — I429 Cardiomyopathy, unspecified: Secondary | ICD-10-CM | POA: Insufficient documentation

## 2015-02-28 DIAGNOSIS — I5021 Acute systolic (congestive) heart failure: Secondary | ICD-10-CM | POA: Diagnosis not present

## 2015-02-28 DIAGNOSIS — J441 Chronic obstructive pulmonary disease with (acute) exacerbation: Secondary | ICD-10-CM | POA: Diagnosis not present

## 2015-02-28 DIAGNOSIS — I63422 Cerebral infarction due to embolism of left anterior cerebral artery: Secondary | ICD-10-CM | POA: Diagnosis not present

## 2015-02-28 DIAGNOSIS — Z9119 Patient's noncompliance with other medical treatment and regimen: Secondary | ICD-10-CM | POA: Diagnosis present

## 2015-02-28 DIAGNOSIS — F17210 Nicotine dependence, cigarettes, uncomplicated: Secondary | ICD-10-CM | POA: Diagnosis present

## 2015-02-28 DIAGNOSIS — M4802 Spinal stenosis, cervical region: Secondary | ICD-10-CM | POA: Diagnosis present

## 2015-02-28 DIAGNOSIS — I340 Nonrheumatic mitral (valve) insufficiency: Secondary | ICD-10-CM | POA: Diagnosis present

## 2015-02-28 DIAGNOSIS — I10 Essential (primary) hypertension: Secondary | ICD-10-CM | POA: Diagnosis not present

## 2015-02-28 DIAGNOSIS — E041 Nontoxic single thyroid nodule: Secondary | ICD-10-CM | POA: Diagnosis present

## 2015-02-28 DIAGNOSIS — R062 Wheezing: Secondary | ICD-10-CM

## 2015-02-28 DIAGNOSIS — R0602 Shortness of breath: Secondary | ICD-10-CM

## 2015-02-28 DIAGNOSIS — G459 Transient cerebral ischemic attack, unspecified: Secondary | ICD-10-CM

## 2015-02-28 DIAGNOSIS — Z720 Tobacco use: Secondary | ICD-10-CM | POA: Diagnosis not present

## 2015-02-28 DIAGNOSIS — I5041 Acute combined systolic (congestive) and diastolic (congestive) heart failure: Secondary | ICD-10-CM | POA: Diagnosis not present

## 2015-02-28 HISTORY — DX: Cerebral infarction, unspecified: I63.9

## 2015-02-28 LAB — I-STAT CHEM 8, ED
BUN: 12 mg/dL (ref 6–20)
Calcium, Ion: 1.13 mmol/L (ref 1.13–1.30)
Chloride: 101 mmol/L (ref 101–111)
Creatinine, Ser: 0.9 mg/dL (ref 0.44–1.00)
Glucose, Bld: 134 mg/dL — ABNORMAL HIGH (ref 65–99)
HCT: 38 % (ref 36.0–46.0)
Hemoglobin: 12.9 g/dL (ref 12.0–15.0)
Potassium: 3.8 mmol/L (ref 3.5–5.1)
Sodium: 135 mmol/L (ref 135–145)
TCO2: 18 mmol/L (ref 0–100)

## 2015-02-28 LAB — ETHANOL: Alcohol, Ethyl (B): <5 mg/dL (ref <5)

## 2015-02-28 LAB — COMPREHENSIVE METABOLIC PANEL
ALT: 18 U/L (ref 14–54)
AST: 28 U/L (ref 15–41)
Albumin: 3.2 g/dL — ABNORMAL LOW (ref 3.5–5.0)
Alkaline Phosphatase: 48 U/L (ref 38–126)
Anion gap: 11 (ref 5–15)
BUN: 12 mg/dL (ref 6–20)
CO2: 21 mmol/L — ABNORMAL LOW (ref 22–32)
Calcium: 8.8 mg/dL — ABNORMAL LOW (ref 8.9–10.3)
Chloride: 102 mmol/L (ref 101–111)
Creatinine, Ser: 1.11 mg/dL — ABNORMAL HIGH (ref 0.44–1.00)
GFR calc Af Amer: 55 mL/min — ABNORMAL LOW (ref >60)
GFR calc non Af Amer: 47 mL/min — ABNORMAL LOW (ref >60)
Glucose, Bld: 130 mg/dL — ABNORMAL HIGH (ref 65–99)
Potassium: 3.9 mmol/L (ref 3.5–5.1)
Sodium: 134 mmol/L — ABNORMAL LOW (ref 135–145)
Total Bilirubin: 0.5 mg/dL (ref 0.3–1.2)
Total Protein: 6.4 g/dL — ABNORMAL LOW (ref 6.5–8.1)

## 2015-02-28 LAB — DIFFERENTIAL
Basophils Absolute: 0 10*3/uL (ref 0.0–0.1)
Basophils Relative: 0 % (ref 0–1)
Eosinophils Absolute: 0 10*3/uL (ref 0.0–0.7)
Eosinophils Relative: 0 % (ref 0–5)
Lymphocytes Relative: 18 % (ref 12–46)
Lymphs Abs: 0.9 10*3/uL (ref 0.7–4.0)
Monocytes Absolute: 0.5 10*3/uL (ref 0.1–1.0)
Monocytes Relative: 10 % (ref 3–12)
Neutro Abs: 3.4 10*3/uL (ref 1.7–7.7)
Neutrophils Relative %: 72 % (ref 43–77)

## 2015-02-28 LAB — CBC
HCT: 35.5 % — ABNORMAL LOW (ref 36.0–46.0)
Hemoglobin: 11.7 g/dL — ABNORMAL LOW (ref 12.0–15.0)
MCH: 30.2 pg (ref 26.0–34.0)
MCHC: 33 g/dL (ref 30.0–36.0)
MCV: 91.7 fL (ref 78.0–100.0)
Platelets: 175 10*3/uL (ref 150–400)
RBC: 3.87 MIL/uL (ref 3.87–5.11)
RDW: 12.3 % (ref 11.5–15.5)
WBC: 4.9 10*3/uL (ref 4.0–10.5)

## 2015-02-28 LAB — I-STAT TROPONIN, ED: Troponin i, poc: 0 ng/mL (ref 0.00–0.08)

## 2015-02-28 LAB — MRSA PCR SCREENING: MRSA by PCR: NEGATIVE

## 2015-02-28 LAB — PROTIME-INR
INR: 1.21 (ref 0.00–1.49)
Prothrombin Time: 15.5 seconds — ABNORMAL HIGH (ref 11.6–15.2)

## 2015-02-28 LAB — APTT: aPTT: 31 seconds (ref 24–37)

## 2015-02-28 MED ORDER — ALTEPLASE (STROKE) FULL DOSE INFUSION
41.0000 mg | Freq: Once | INTRAVENOUS | Status: AC
  Administered 2015-02-28: 41 mg via INTRAVENOUS
  Filled 2015-02-28: qty 41

## 2015-02-28 MED ORDER — PANTOPRAZOLE SODIUM 40 MG IV SOLR
40.0000 mg | Freq: Every day | INTRAVENOUS | Status: DC
  Administered 2015-02-28: 40 mg via INTRAVENOUS
  Filled 2015-02-28 (×2): qty 40

## 2015-02-28 MED ORDER — STROKE: EARLY STAGES OF RECOVERY BOOK
Freq: Once | Status: DC
  Filled 2015-02-28: qty 1

## 2015-02-28 MED ORDER — SODIUM CHLORIDE 0.9 % IV SOLN
INTRAVENOUS | Status: DC
  Administered 2015-02-28 (×2): via INTRAVENOUS

## 2015-02-28 MED ORDER — METHYLPREDNISOLONE SODIUM SUCC 125 MG IJ SOLR: 125.0000 mg | Freq: Once | INTRAMUSCULAR | Status: DC

## 2015-02-28 MED ORDER — ALBUTEROL SULFATE (2.5 MG/3ML) 0.083% IN NEBU
2.5000 mg | INHALATION_SOLUTION | Freq: Once | RESPIRATORY_TRACT | Status: AC
  Administered 2015-02-28: 2.5 mg via RESPIRATORY_TRACT
  Filled 2015-02-28: qty 3

## 2015-02-28 MED ORDER — ENSURE ENLIVE PO LIQD: 237.0000 mL | Freq: Two times a day (BID) | ORAL | Status: DC

## 2015-02-28 MED ORDER — SENNOSIDES-DOCUSATE SODIUM 8.6-50 MG PO TABS
1.0000 | ORAL_TABLET | Freq: Every evening | ORAL | Status: DC | PRN
Start: 2015-02-28 — End: 2015-03-04
  Filled 2015-02-28: qty 1

## 2015-02-28 MED ORDER — ACETAMINOPHEN 325 MG PO TABS: 650.0000 mg | ORAL_TABLET | ORAL | Status: DC | PRN

## 2015-02-28 MED ORDER — ACETAMINOPHEN 650 MG RE SUPP: 650.0000 mg | RECTAL | Status: DC | PRN

## 2015-02-28 MED ORDER — IOHEXOL 350 MG/ML SOLN
50.0000 mL | Freq: Once | INTRAVENOUS | Status: AC | PRN
  Administered 2015-02-28: 50 mL via INTRAVENOUS

## 2015-02-28 MED ORDER — LABETALOL HCL 5 MG/ML IV SOLN
10.0000 mg | Freq: Once | INTRAVENOUS | Status: AC
  Administered 2015-02-28: 10 mg via INTRAVENOUS

## 2015-02-28 MED ORDER — LABETALOL HCL 5 MG/ML IV SOLN
INTRAVENOUS | Status: AC
  Filled 2015-02-28: qty 4

## 2015-02-28 NOTE — Consult Note (Signed)
Stroke Consult    Chief Complaint: altered mental status, right sided weakness  HPI: Tammy [name] is an 74 y.o. female history of confusion/cognitive decline presenting with acute onset of altered mental status and right sided weakness (LE>UE). Per EMS patient also initially non-verbal and not following commands. Upon arrival has fluctuating mental status, will have fluent speech followed by periods of no verbal output. Patient had fluctuating levels of weakness on the right side, transiently returning to baseline for a brief period. CT head and CT angio imaging reviewed shows no acute process but 50% stenosis within proximal left ICA. Due to uncertain nature of presentation, the patient was taken for a stat DWI imaging. Suspected area of restricted diffusion was seen in the left frontal parietal region consistent with suspected acute infarct. After review with neuroradiology there was question whether it represented a true acute infarct or chronic ischemia. Case then discussed with stroke neurologist, Dr Roda Shutters, who felt history and MRI presentation consistent with acute infarct. Based on presentation and symptoms decision made to proceed with IV tPA. Benefits and risks discussed with patients son who expressed understanding. No clear contraindications to tPA present.   Date last known well: 02/28/2015 Time last known well: 0930 tPA Given: yes  Past Medical History  Diagnosis Date  . Insomnia     History reviewed. No pertinent past surgical history.  No family history on file. Social History:  reports that she has been smoking Cigarettes.  She does not have any smokeless tobacco history on file. She reports that she does not drink alcohol or use illicit drugs.  Allergies: No Known Allergies   (Not in a hospital admission)  ROS: Out of a complete 14 system review, the patient complains of only the following symptoms, and all other reviewed systems are negative. +weakness  Physical  Examination: Filed Vitals:   02/28/15 1052  BP: 154/85  Pulse: 131  Temp: 98 F (36.7 C)  Resp: 20   Physical Exam  Constitutional: He appears well-developed and well-nourished.  Psych: Affect appropriate to situation Eyes: No scleral injection HENT: No OP obstrucion Head: Normocephalic.  Cardiovascular: Normal rate and regular rhythm.  Respiratory: Effort normal and breath sounds normal.  GI: Soft. Bowel sounds are normal. No distension. There is no tenderness.  Skin: WDI  Neurologic Examination: Mental Status: Alert, mildly confused but oriented to name, 2016 and hospital.  Intermittently fluent speech though question if partly volitional. Intermittently follows commands.  Cranial Nerves: II: optic discs not visualized, visual fields grossly normal, pupils equal, round, reactive to light and accommodation III,IV, VI: ptosis not present, extra-ocular motions intact bilaterally V,VII: smile symmetric, facial light touch sensation normal bilaterally VIII: hearing normal bilaterally IX,X: gag reflex present XI: trapezius strength/neck flexion strength normal bilaterally XII: tongue strength normal  Motor: Unable to formally test due to mental status but appears to move left side UE and LE 5/5 strength Fluctuating weakness but currently flaccid weakness in RUE and RLE (though shortly before was 5/5 in both) Sensory: decreased withdrawal to noxious stimuli on right side Deep Tendon Reflexes: 2+ and symmetric throughout Plantars: Right: downgoing   Left: downgoing Cerebellar: Unable to test due to not following commands Gait: deferred  Laboratory Studies:   Basic Metabolic Panel:  Recent Labs Lab 02/28/15 1024  NA 135  K 3.8  CL 101  GLUCOSE 134*  BUN 12  CREATININE 0.90    Liver Function Tests: No results for input(s): AST, ALT, ALKPHOS, BILITOT, PROT, ALBUMIN in the  last 168 hours. No results for input(s): LIPASE, AMYLASE in the last 168 hours. No results  for input(s): AMMONIA in the last 168 hours.  CBC:  Recent Labs Lab 02/28/15 1017 02/28/15 1024  WBC 4.9  --   NEUTROABS 3.4  --   HGB 11.7* 12.9  HCT 35.5* 38.0  MCV 91.7  --   PLT 175  --     Cardiac Enzymes: No results for input(s): CKTOTAL, CKMB, CKMBINDEX, TROPONINI in the last 168 hours.  BNP: Invalid input(s): POCBNP  CBG: No results for input(s): GLUCAP in the last 168 hours.  Microbiology: No results found for this or any previous visit.  Coagulation Studies:  Recent Labs  02/28/15 1017  LABPROT 15.5*  INR 1.21    Urinalysis: No results for input(s): COLORURINE, LABSPEC, PHURINE, GLUCOSEU, HGBUR, BILIRUBINUR, KETONESUR, PROTEINUR, UROBILINOGEN, NITRITE, LEUKOCYTESUR in the last 168 hours.  Invalid input(s): APPERANCEUR  Lipid Panel:  No results found for: CHOL, TRIG, HDL, CHOLHDL, VLDL, LDLCALC  HgbA1C: No results found for: HGBA1C  Urine Drug Screen:     Component Value Date/Time   LABOPIA NONE DETECTED 06/20/2011 2033   COCAINSCRNUR NONE DETECTED 06/20/2011 2033   LABBENZ NONE DETECTED 06/20/2011 2033   AMPHETMU NONE DETECTED 06/20/2011 2033   THCU NONE DETECTED 06/20/2011 2033   LABBARB NONE DETECTED 06/20/2011 2033    Alcohol Level: No results for input(s): ETH in the last 168 hours.  Other results:  Imaging: Ct Head Wo Contrast  02/28/2015   CLINICAL DATA:  Altered mental status, leaning to the right.  EXAM: CT HEAD WITHOUT CONTRAST  TECHNIQUE: Contiguous axial images were obtained from the base of the skull through the vertex without intravenous contrast.  COMPARISON:  MR brain 06/28/2011 and CT head 06/20/2011.  FINDINGS: No evidence of an acute infarct, acute hemorrhage, mass lesion, mass effect or hydrocephalus. Atrophy. Periventricular low attenuation is mild. Visualized portions of the paranasal sinuses and mastoid air cells are clear.  IMPRESSION: 1. No acute intracranial abnormality. 2. Atrophy and chronic microvascular white matter  ischemic changes.   Electronically Signed   By: Leanna BattlesMelinda  Blietz M.D.   On: 02/28/2015 10:42    Assessment: 74 y.o. female hx of insomnia presenting with acute onset of altered mental status and right sided weakness. Atypical presentation based on fluctuating mental status and weakness. DWI imaging inconclusive but symptoms appear most consistent with acute ischemic infarct. After discussion of benefits/risks with son the decision was made to give IV tPA. Will admit for further stroke workup.    Plan: 1. HgbA1c, fasting lipid panel 2. MRI brain without contrast 3. PT consult, OT consult, Speech consult 4. Echocardiogram 5. Carotid dopplers 6. Prophylactic therapy-hold 24hrs post tPA 7. Risk factor modification 8. Telemetry monitoring 9. Frequent neuro checks 10. NPO until RN stroke swallow screen   Elspeth Choeter Areatha Kalata, DO [facility] [phone]  If 7pm- 7am, please page neurology on call as listed in [facility]ION. 02/28/2015, 10:54 AM

## 2015-02-28 NOTE — ED Notes (Signed)
MD at the bedside. Received patient at the bedside.

## 2015-02-28 NOTE — ED Notes (Signed)
Pt son has taken pt purse and pt car keys at this time.

## 2015-02-28 NOTE — Code Documentation (Signed)
74[facility]  [facility] [place] [facility] per her usual per staff at the bank who know her well.  EMS reports that staff noticed her to become incoherent and leaning to the right.  EMS activated a Code Stroke and reports patient to be nonverbal, not following commands and unable to move the right side.  Stroke team to the bedside.  Labs drawn and patient to CT.  CTA completed per Dr. Hosie PoissonSumner.  NIHSS 6, see documentation for details and code stroke times.  Patient with inconsistent exam with patient following commands and responding appropriately at times.  Patient unable to move right leg.  Patient to STAT MRI.  Patient monitored during MRI.  MRI DWI positive for acute infarct per MD.  Pharmacy notified for tPA and patient transported back to the ED.   Patient's O2 sats dropped once back to the ED.  Oxygen applied via [place]. ED RN reports wheezes on auscultation and EDP ordered breathing treatment and PCXR.  Patient is a heavy smoker per her son.  Patient hypertensive requiring Labetalol IVP per MD. Patient reassessed around 1145 prior to tPA administration and patient now moving right arm and leg without drift and patient able to name objects and respond appropriately.  Dr. Hosie PoissonSumner to the bedside and order to hold off on tPA d/t symptoms improving.  Patient reassessed around 1200 and patient's weakness had returned.  Dr. Hosie PoissonSumner to the bedside with order to give tPA.  4mg  tPA bolus given at 1209 followed by 37 mg/hr over one hour for a total of 41mg  per pharmacy.  Dr. Hosie PoissonSumner discussed plan of care with patient's son at the bedside.  Patient to be frequently monitored per post-tPA protocol.  Bedside handoff with ED RN Dahlia ClientHannah.

## 2015-02-28 NOTE — ED Notes (Signed)
Patient being transported upstairs.

## 2015-02-28 NOTE — ED Provider Notes (Signed)
CSN: [id number]     Arrival date & time 02/28/15  1011 History   First MD Initiated Contact with Patient 02/28/15 1021     Chief Complaint  Patient presents with  . Code Stroke     (Consider location/radiation/quality/duration/timing/severity/associated sxs/prior Treatment) HPI Comments: Patient here after having a sudden onset of right-sided weakness and trouble speaking which began just prior to arrival. No prior history of CVA. Apparently the patient slumped to the right side became aphasic. EMS was called and CBG was over 80. Patient transported here as a code stroke and no further history obtainable due to her current state.  The history is provided by the patient and the EMS personnel. The history is limited by the condition of the patient.    Past Medical History  Diagnosis Date  . Insomnia    History reviewed. No pertinent past surgical history. No family history on file. History  Substance Use Topics  . Smoking status: Current Every Day Smoker    Types: Cigarettes  . Smokeless tobacco: Not on file  . Alcohol Use: No   OB History    No data available     Review of Systems  Unable to perform ROS     Allergies  Review of patient's allergies indicates no known allergies.  Home Medications   Prior to Admission medications   Not on File   There were no vitals taken for this visit. Physical Exam  Constitutional: She is oriented to person, place, and time. She appears well-developed and well-nourished.  Non-toxic appearance. No distress.  HENT:  Head: Normocephalic and atraumatic.  Eyes: Conjunctivae, EOM and lids are normal. Pupils are equal, round, and reactive to light.  Neck: Normal range of motion. Neck supple. No tracheal deviation present. No thyroid mass present.  Cardiovascular: Normal rate, regular rhythm and normal heart sounds.  Exam reveals no gallop.   No murmur heard. Pulmonary/Chest: Effort normal and breath sounds normal. No stridor. No  respiratory distress. She has no decreased breath sounds. She has no wheezes. She has no rhonchi. She has no rales.  Abdominal: Soft. Normal appearance and bowel sounds are normal. She exhibits no distension. There is no tenderness. There is no rebound and no CVA tenderness.  Musculoskeletal: Normal range of motion. She exhibits no edema or tenderness.  Neurological: She is alert and oriented to person, place, and time. She displays no tremor. No cranial nerve deficit. Coordination abnormal. GCS eye subscore is 4. GCS verbal subscore is 5. GCS motor subscore is 6.  Left upper and left lower extremity strength 5/5. Right lower extremity strength 0/5. Upper extremity strength 3/5. Dysmetria noted on the right upper extremity. Speech is slightly slurred.  Skin: Skin is warm and dry. No abrasion and no rash noted.  Psychiatric: Her affect is blunt. Her speech is delayed. She is slowed.  Nursing note and vitals reviewed.   ED Course  Procedures (including critical care time) Labs Review Labs Reviewed  PROTIME-INR - Abnormal; Notable for the following:    Prothrombin Time 15.5 (*)    All other components within normal limits  CBC - Abnormal; Notable for the following:    Hemoglobin 11.7 (*)    HCT 35.5 (*)    All other components within normal limits  I-STAT CHEM 8, ED - Abnormal; Notable for the following:    Glucose, Bld 134 (*)    All other components within normal limits  DIFFERENTIAL  ETHANOL  APTT  COMPREHENSIVE METABOLIC PANEL  URINE  RAPID DRUG SCREEN (HOSP PERFORMED)  URINALYSIS, ROUTINE W REFLEX MICROSCOPIC  I-STAT TROPOININ, ED    Imaging Review Ct Head Wo Contrast  02/28/2015   CLINICAL DATA:  Altered mental status, leaning to the right.  EXAM: CT HEAD WITHOUT CONTRAST  TECHNIQUE: Contiguous axial images were obtained from the base of the skull through the vertex without intravenous contrast.  COMPARISON:  MR brain 06/28/2011 and CT head 06/20/2011.  FINDINGS: No evidence of  an acute infarct, acute hemorrhage, mass lesion, mass effect or hydrocephalus. Atrophy. Periventricular low attenuation is mild. Visualized portions of the paranasal sinuses and mastoid air cells are clear.  IMPRESSION: 1. No acute intracranial abnormality. 2. Atrophy and chronic microvascular white matter ischemic changes.   Electronically Signed   By: Leanna BattlesMelinda  Blietz M.D.   On: 02/28/2015 10:42     EKG Interpretation None      MDM   Final diagnoses:  Stroke    Patient called a code stroke prior to arrival. Neurology at bedside upon patient's arrival. Head CT results noted. Disposition pending per neurology    Lorre NickAnthony Yeilyn Gent, MD 03/03/15 1630

## 2015-02-28 NOTE — Evaluation (Signed)
Clinical/Bedside Swallow Evaluation Patient Details  Name: Tammy [name] Metayer MRN: [id number] Date of Birth: January 04, 1941  Today's Date: 02/28/2015 Time: SLP Start Time (ACUTE ONLY): 1500 SLP Stop Time (ACUTE ONLY): 1551 SLP Time Calculation (min) (ACUTE ONLY): 51 min  Past Medical History:  Past Medical History  Diagnosis Date  . Insomnia    Past Surgical History: History reviewed. No pertinent past surgical history. HPI:  Tammy [name] is an 74 y.o. female history of confusion/cognitive decline presenting with acute onset of altered mental status and right sided weakness (LE>UE). Per EMS patient also initially non-verbal and not following commands. Tammy [name] is an 74 y.o. female history of confusion/cognitive decline presenting with acute onset of altered mental status and right sided weakness (LE>UE). Per EMS patient also initially non-verbal and not following commands. Complete MRI pending; initial MRI with ischemic changes and Neurologist ?'s acute CVA.   Assessment / Plan / Recommendation Clinical Impression  Pt exhibits frequent coughing, with and without po's.  Pt states "I smoke and I cough."  Daughter-in-law at bedside reports pt does cough frequently at baseline, and that she has been coughing even when NPO.  Pt also had burping after sips of water, and appeared to be satiated after only a few bites and sips. Question a chronic esophageal issue that may be reason for her significant weight loss. Recommend Nurtrition consult.    Aspiration Risk  Mild    Diet Recommendation Dysphagia 3 (Mech soft);Thin   Medication Administration: Whole meds with puree Compensations: Slow rate;Small sips/bites    Other  Recommendations Recommended Consults: Consider esophageal assessment Oral Care Recommendations: Oral care BID Other Recommendations: Clarify dietary restrictions   Follow Up Recommendations       Frequency and Duration min 2x/week  2 weeks   Pertinent Vitals/Pain  n/a    SLP Swallow Goals     Swallow Study Prior Functional Status       General Other Pertinent Information: Tammy [name] is an 74 y.o. female history of confusion/cognitive decline presenting with acute onset of altered mental status and right sided weakness (LE>UE). Per EMS patient also initially non-verbal and not following commands. Tammy [name] is an 74 y.o. female history of confusion/cognitive decline presenting with acute onset of altered mental status and right sided weakness (LE>UE). Per EMS patient also initially non-verbal and not following commands. Complete MRI pending; initial MRI with ischemic changes and Neurologist ?'s acute CVA. Type of Study: Bedside swallow evaluation Previous Swallow Assessment: none found Diet Prior to this Study: NPO Temperature Spikes Noted: No Respiratory Status: Supplemental O2 delivered via (comment) (Suboptimal inspiration accounts for bibasilar atelectasis. N) History of Recent Intubation: No Behavior/Cognition: Alert;Impulsive;Distractible;Requires cueing;Fusing/Irritable Oral Cavity - Dentition:  (dentures, upper/lower) Self-Feeding Abilities: Able to feed self Patient Positioning: Upright in bed Baseline Vocal Quality: Normal Volitional Cough: Congested Volitional Swallow: Able to elicit    Oral/Motor/Sensory Function Overall Oral Motor/Sensory Function: Appears within functional limits for tasks assessed   Ice Chips Ice chips: Within functional limits Presentation: Spoon   Thin Liquid Thin Liquid: Impaired Presentation: Cup;Straw Pharyngeal  Phase Impairments: Cough - Delayed    Nectar Thick Nectar Thick Liquid: Not tested (pt refused)   Honey Thick Honey Thick Liquid: Not tested   Puree Puree: Within functional limits Presentation: Spoon   Solid   GO    Solid: Within functional limits Presentation: Self Tammy [name]       Kayti Poss [name] 02/28/2015,3:51 PM

## 2015-02-28 NOTE — ED Notes (Signed)
Neurology and MD at the bedside.

## 2015-02-28 NOTE — Procedures (Signed)
ELECTROENCEPHALOGRAM REPORT   Patient: Tammy [name]       Room #: 3M01 EEG No. ID: [id number] Age: 74 y.o.        Sex: female Referring Physician: Roda ShuttersXu Report Date:  02/28/2015        Interpreting Physician: Thana FarrEYNOLDS, Fatina Sprankle  History: Tammy [name] is an 74 y.o. female presenting with acute mental status change accompanied by right sided weakness and speech deficits.  Medications:  I have reviewed the patient's current medications. Scheduled: .  stroke: mapping our early stages of recovery book   Does not apply Once  . [START ON 03/01/2015] feeding supplement (ENSURE ENLIVE)  237 mL Oral BID BM  . labetalol      . methylPREDNISolone (SOLU-MEDROL) injection  125 mg Intravenous Once  . pantoprazole (PROTONIX) IV  40 mg Intravenous QHS    Conditions of Recording:  This is a 16 channel EEG carried out with the patient in the awake state.  Description:  Artifact is prominent during the recording and at times obscures the background rhythm completely.  When able to be visualized the waking background activity consists of a low voltage, symmetrical, fairly well organized, 10 Hz alpha activity, seen from the parieto-occipital and posterior temporal regions.  Low voltage fast activity, poorly organized, is seen anteriorly and is at times superimposed on more posterior regions.  A mixture of theta and alpha rhythms are seen from the central and temporal regions.  The patient does not drowse or sleep. Hyperventilation and intermittent photic stimulation were not performed.   IMPRESSION: Normal awake electroencephalogram. There are no focal lateralizing or epileptiform features.  Comment:  An EEG with the patient sleep deprived to elicit drowse and light sleep may be desirable to further elicit a possible seizure disorder.     Thana FarrLeslie Hendel Gatliff, MD [facility] [phone] 02/28/2015, 8:28 PM

## 2015-02-28 NOTE — Progress Notes (Signed)
  Echocardiogram 2D Echocardiogram has been performed.  Tammy [name], Tammy [name] 02/28/2015, 3:17 PM

## 2015-02-28 NOTE — ED Notes (Signed)
Pt brought to ED via EMS - [place] this [place], [place] herself there, was talking to staff when suddenly became aphasic and "slumping to the right side"; on arrival pt is moving bilateral extremities, is not speaking, and is not following commands. Airway is intact. Cleared to CT 1.

## 2015-02-28 NOTE — ED Notes (Signed)
While in CT, pt began speaking - alert to self. Speaking clearly. Not following commands however.

## 2015-02-28 NOTE — Progress Notes (Signed)
EEG Completed; Results Pending  

## 2015-03-01 ENCOUNTER — Inpatient Hospital Stay (HOSPITAL_COMMUNITY): Payer: Medicare Other

## 2015-03-01 ENCOUNTER — Encounter (HOSPITAL_COMMUNITY): Payer: Self-pay | Admitting: *Deleted

## 2015-03-01 DIAGNOSIS — I5041 Acute combined systolic (congestive) and diastolic (congestive) heart failure: Secondary | ICD-10-CM

## 2015-03-01 DIAGNOSIS — I63422 Cerebral infarction due to embolism of left anterior cerebral artery: Principal | ICD-10-CM

## 2015-03-01 DIAGNOSIS — E46 Unspecified protein-calorie malnutrition: Secondary | ICD-10-CM

## 2015-03-01 LAB — LIPID PANEL
Cholesterol: 120 mg/dL (ref 0–200)
HDL: 47 mg/dL (ref >40)
LDL Cholesterol: 61 mg/dL (ref 0–99)
Total CHOL/HDL Ratio: 2.6 RATIO
Triglycerides: 60 mg/dL (ref <150)
VLDL: 12 mg/dL (ref 0–40)

## 2015-03-01 LAB — TROPONIN I
Troponin I: 0.06 ng/mL — ABNORMAL HIGH (ref <0.031)
Troponin I: 0.06 ng/mL — ABNORMAL HIGH (ref <0.031)

## 2015-03-01 MED ORDER — BOOST / RESOURCE BREEZE PO LIQD
1.0000 | Freq: Two times a day (BID) | ORAL | Status: DC
  Administered 2015-03-02 (×2): 1 via ORAL

## 2015-03-01 MED ORDER — ALBUTEROL SULFATE (2.5 MG/3ML) 0.083% IN NEBU: 2.5000 mg | INHALATION_SOLUTION | RESPIRATORY_TRACT | Status: DC | PRN

## 2015-03-01 MED ORDER — MELATONIN 3 MG PO TABS
3.0000 mg | ORAL_TABLET | Freq: Every day | ORAL | Status: DC
  Administered 2015-03-02 – 2015-03-03 (×2): 3 mg via ORAL
  Filled 2015-03-01 (×4): qty 1

## 2015-03-01 MED ORDER — FUROSEMIDE 10 MG/ML IJ SOLN
40.0000 mg | Freq: Once | INTRAMUSCULAR | Status: AC
  Administered 2015-03-01: 40 mg via INTRAVENOUS
  Filled 2015-03-01: qty 4

## 2015-03-01 MED ORDER — NON FORMULARY: 3.0000 mg | Freq: Every day | Status: DC

## 2015-03-01 MED ORDER — PANTOPRAZOLE SODIUM 40 MG PO TBEC
40.0000 mg | DELAYED_RELEASE_TABLET | Freq: Every day | ORAL | Status: DC
  Administered 2015-03-02 – 2015-03-04 (×3): 40 mg via ORAL
  Filled 2015-03-01 (×3): qty 1

## 2015-03-01 MED ORDER — ASPIRIN 325 MG PO TABS
325.0000 mg | ORAL_TABLET | Freq: Every day | ORAL | Status: DC
  Administered 2015-03-02 – 2015-03-04 (×3): 325 mg via ORAL
  Filled 2015-03-01 (×3): qty 1

## 2015-03-01 NOTE — Evaluation (Signed)
Speech Language Pathology Evaluation Patient Details Name: Tammy [name] MRN: [id number] DOB: 08/24/1941 Today's Date: [name] Time: [id number] SLP Time Calculation (min) (ACUTE ONLY): 22 min  Problem List:  Patient Active Problem List   Diagnosis Date Noted  . CVA (cerebral infarction) 02/28/2015  . Stroke    Past Medical History:  Past Medical History  Diagnosis Date  . Insomnia    Past Surgical History: History reviewed. No pertinent past surgical history. HPI:  Tammy [name] is an 74 y.o. female history of confusion/cognitive decline presenting with acute onset of altered mental status and right sided weakness (LE>UE). Per EMS patient also initially non-verbal and not following commands. Tammy [name] is an 74 y.o. female history of confusion/cognitive decline presenting with acute onset of altered mental status and right sided weakness (LE>UE). Per EMS patient also initially non-verbal and not following commands. Complete MRI pending; initial MRI with ischemic changes and Neurologist ?'s acute CVA.   Assessment / Plan / Recommendation Clinical Impression  Pt has impaired sustained and selective attention, retrieval of new information, and mildly complex problem solving skills in addition to overall decreased awareness of deficits, making her unsafe to live independently at this time. Speech is very mildly slurred and with intermittent phonemic paraphasias at the conversational level. Recommend HH SLP f/u and 24/7 supervision upon return home. Discussed recommendations with patient.    SLP Assessment  Patient needs continued Speech Lanaguage Pathology Services    Follow Up Recommendations  Home health SLP;24 hour supervision/assistance    Frequency and Duration min 2x/week  1 week   Pertinent Vitals/Pain Pain Assessment: No/denies pain   SLP Goals  Progression toward goals: Progressing toward goals Patient/Family Stated Goal: wants to go home - son reports  concern about pt going home by herself Potential to Achieve Goals (ACUTE ONLY): Fair Potential Considerations (ACUTE ONLY): Cooperation/participation level;Other (comment) (pt wants to leave AMA)  SLP Evaluation Prior Functioning  Cognitive/Linguistic Baseline: Information not available (son reports different from baseline though)  Lives With: Alone Available Help at Discharge: Family   Cognition  Overall Cognitive Status: Impaired/Different from baseline Arousal/Alertness: Awake/alert Orientation Level: Oriented X4 Attention: Sustained;Selective Sustained Attention: Impaired Sustained Attention Impairment: Verbal basic Selective Attention: Impaired Selective Attention Impairment: Functional basic Memory: Impaired Memory Impairment: Retrieval deficit;Decreased recall of new information Awareness: Impaired Awareness Impairment: Intellectual impairment;Emergent impairment;Anticipatory impairment Problem Solving: Impaired Problem Solving Impairment: Functional basic Behaviors: Restless;Impulsive;Verbal agitation Safety/Judgment: Impaired    Comprehension  Auditory Comprehension Overall Auditory Comprehension: Impaired Commands: Impaired One Step Basic Commands: 75-100% accurate Two Step Basic Commands: 75-100% accurate Multistep Basic Commands: 50-74% accurate Conversation: Simple Interfering Components: Attention;Working [employer] fast streamermemory Reading Comprehension Reading Status: Within funtional limits    Expression Expression Primary Mode of Expression: Verbal Verbal Expression Overall Verbal Expression: Impaired Initiation: No impairment Level of Generative/Spontaneous Verbalization: Conversation Repetition: No impairment Naming: Impairment Confrontation: Within functional limits Verbal Errors: Phonemic paraphasias Non-Verbal Means of Communication: Not applicable   Oral / Motor Motor Speech Overall Motor Speech: Impaired Respiration: Within functional limits Phonation: Other  (comment) (mildly rough (heavy smoker per son)) Resonance: Within functional limits Articulation: Impaired Level of Impairment: Conversation Intelligibility: Intelligible Motor Planning: Witnin functional limits Motor Speech Errors: Not applicable    Maxcine HamLaura Paiewonsky, M.A. CCC-SLP [phone]  Maxcine Hamaiewonsky, Tammy [name], 11:36 AM

## 2015-03-01 NOTE — Progress Notes (Signed)
Speech Language Pathology Treatment: Dysphagia  Patient Details Name: Tammy [name] [place] MRN: [id number] DOB: July 28, 1941 Today's Date: 03/01/2015 Time: [id number] SLP Time Calculation (min) (ACUTE ONLY): 10 min  Assessment / Plan / Recommendation Clinical Impression  Pt seen for f/u dysphagia treatment, with skilled observation of thin liquids and pureed solids (pt declined more solid POs, stating that she was still too full from breakfast). No overt signs of aspiration observed today across challenging with liquids. Note that pt is requesting to leave today - will f/u as able to better assess tolerance of solid POs.   HPI Other Pertinent Information: [name] Seybold is an 74 y.o. female history of confusion/cognitive decline presenting with acute onset of altered mental status and right sided weakness (LE>UE). Per EMS patient also initially non-verbal and not following commands. [place] Nadyne Coomb[place] Lingafelter is an 74 y.o. female history of confusion/cognitive decline presenting with acute onset of altered mental status and right sided weakness (LE>UE). Per EMS patient also initially non-verbal and not following commands. Complete MRI pending; initial MRI with ischemic changes and Neurologist ?'s acute CVA.   Pertinent Vitals Pain Assessment: No/denies pain  SLP Plan  Continue with current plan of care    Recommendations Diet recommendations: Dysphagia 3 (mechanical soft);Thin liquid Liquids provided via: Cup;Straw Medication Administration: Whole meds with puree Supervision: Patient able to self feed;Intermittent supervision to cue for compensatory strategies Compensations: Slow rate;Small sips/bites Postural Changes and/or Swallow Maneuvers: Seated upright 90 degrees;Upright 30-60 min after meal       Oral Care Recommendations: Oral care BID Follow up Recommendations: None (for swallowing) Plan: Continue with current plan of care    Maxcine HamLaura Paiewonsky, M.A. CCC-SLP [phone]  Maxcine Hamaiewonsky,  Floy Riegler 03/01/2015, 11:25 AM

## 2015-03-01 NOTE — Progress Notes (Signed)
OT Cancellation Note  Patient Details Name: Tammy [name] MRN: [id number] DOB: [birth year]   Cancelled Treatment:    Reason Eval/Treat Not Completed: Patient not medically ready Pt with bedrest orders. Please update activity orders when appropriate to initiate therapy. Thanks [facility] Facility[facility],HILLARY  Jerry Haugen, OTR/L  [phone] 03/01/2015 03/01/2015, 8:22 AM

## 2015-03-01 NOTE — Care Management Note (Signed)
Case Management Note  Patient Details  Name: Tammy [name] MRN: [id number] Date of Birth: November 13, 1940  Subjective/Objective:     Pt admitted on 02/28/15 with CVA s/p TPA.  PTA, pt independent; has supportive family.               Action/Plan: PT/OT to see when bedrest up.  Will follow for discharge planning as pt progresses.    Expected Discharge Date:                  Expected Discharge Plan:  Home/Self Care  In-House Referral:  Clinical Social Work  Discharge planning Services  CM Consult  Post Acute Care Choice:    Choice offered to:     DME Arranged:    DME Agency:     HH Arranged:    HH Agency:     Status of Service:  In process, will continue to follow  Medicare Important Message Given:    Date Medicare IM Given:    Medicare IM give by:    Date Additional Medicare IM Given:    Additional Medicare Important Message give by:     If discussed at Long Length of Stay Meetings, dates discussed:    Additional Comments:  Quintella BatonJulie W. Rhone Ozaki, RN, BSN  Trauma/Neuro ICU Case Manager [phone]

## 2015-03-01 NOTE — Progress Notes (Signed)
PT Cancellation Note  Patient Details Name: [place] T Sones MRN: [id number] DOB: Apr 04, 1941   Cancelled Treatment:    Reason Eval/Treat Not Completed: Patient not medically ready.  Pt still on strict bedrest.  Will await activity orders. 03/01/2015   BingKen Hudson Majkowski, PT [phone]  (pager)   Johnryan Sao, Eliseo GumKenneth V 03/01/2015, 9:56 AM

## 2015-03-01 NOTE — Progress Notes (Signed)
Initial Nutrition Assessment  DOCUMENTATION CODES:  Severe malnutrition in context of chronic illness  INTERVENTION:  Boost Breeze  NUTRITION DIAGNOSIS:  Malnutrition related to chronic illness as evidenced by severe depletion of body fat, severe depletion of muscle mass.   GOAL:  Patient will meet greater than or equal to 90% of their needs   MONITOR:  PO intake, Supplement acceptance, Labs, Weight trends  REASON FOR ASSESSMENT:  Malnutrition Screening Tool    ASSESSMENT:  Pt with hx of insomnia admitted for acute infarct, s/p IV tPA.  Pt has threatened to leave AMA multiple times. Pt was not happy to answer questions but was able to gather some info from pt. Family in room but did not contribute to interview.  Pt reports losing a lot of weight after losing husband (7 years ago) and pt lost a son last year. Pt lives alone, has oatmeal for breakfast, chicken salad for lunch, dinner is a regular meal (corn, green beans, ham). She will eat the ham for the entire week. Pt only drinks water and peach soda. Pt does not drink supplements at home and does not like them. Recommended peach Breeze, son wants to offer it to his mom. Put in the order and brought them one for them to try, notified RN.  Pt meets criteria for severe chronic malnutrition, pt has been unable to increase her weight due to  Pt only consumed 25% of her meal earlier. No teeth currently present.  Nutrition-Focused physical exam completed. Findings are severe fat depletion, severe muscle depletion, and no edema.     Height:  Ht Readings from Last 1 Encounters:  02/28/15 5\' 7"  (1.702 m)    Weight:  Wt Readings from Last 1 Encounters:  02/28/15 96 lb 9 oz (43.8 kg)    Ideal Body Weight:  61.3 kg  Wt Readings from Last 10 Encounters:  02/28/15 96 lb 9 oz (43.8 kg)  01/06/15 95 lb 9.6 oz (43.364 kg)    BMI:  Body mass index is 15.12 kg/(m^2).  Estimated Nutritional Needs:  Kcal:   1300-1500  Protein:  65-75  Fluid:  > 1.5 L/day  Skin:  Reviewed, no issues  Diet Order:  DIET DYS 3 Room service appropriate?: Yes; Fluid consistency:: Thin  EDUCATION NEEDS:  No education needs identified at this time   Intake/Output Summary (Last 24 hours) at 03/01/15 1611 Last data filed at 03/01/15 1032  Gross per 24 hour  Intake   1565 ml  Output    700 ml  Net    865 ml    Last BM:  5/26  Kendell BaneHeather Anaelle Dunton RD, LDN, CNSC [phone] Pager [phone] After Hours Pager

## 2015-03-01 NOTE — Progress Notes (Addendum)
STROKE TEAM PROGRESS NOTE   SUBJECTIVE (INTERVAL HISTORY) Her son and daughter in law are at the bedside.  Overall she feels her condition is rapidly improving. She has wheezing during expiration likely due to fluid overload. Will do CXR and lasix. She refused MRI.  OBJECTIVE Temp:  [97.2 F (36.2 C)-98.5 F (36.9 C)] 98.4 F (36.9 C) (05/26 1609) Pulse Rate:  [57-117] 117 (05/26 0900) Cardiac Rhythm:  [-] Sinus tachycardia (05/26 0800) Resp:  [15-69] 32 (05/26 1000) BP: (102-187)/(69-99) 150/99 mmHg (05/26 1000) SpO2:  [91 %-100 %] 94 % (05/26 0900)  No results for input(s): GLUCAP in the last 168 hours.  Recent Labs Lab 02/28/15 1017 02/28/15 1024  NA 134* 135  K 3.9 3.8  CL 102 101  CO2 21*  --   GLUCOSE 130* 134*  BUN 12 12  CREATININE 1.11* 0.90  CALCIUM 8.8*  --     Recent Labs Lab 02/28/15 1017  AST 28  ALT 18  ALKPHOS 48  BILITOT 0.5  PROT 6.4*  ALBUMIN 3.2*    Recent Labs Lab 02/28/15 1017 02/28/15 1024  WBC 4.9  --   NEUTROABS 3.4  --   HGB 11.7* 12.9  HCT 35.5* 38.0  MCV 91.7  --   PLT 175  --     Recent Labs Lab 03/01/15 1057  TROPONINI 0.06*    Recent Labs  02/28/15 1017  LABPROT 15.5*  INR 1.21   No results for input(s): COLORURINE, LABSPEC, PHURINE, GLUCOSEU, HGBUR, BILIRUBINUR, KETONESUR, PROTEINUR, UROBILINOGEN, NITRITE, LEUKOCYTESUR in the last 72 hours.  Invalid input(s): APPERANCEUR     Component Value Date/Time   CHOL 120 03/01/2015 0218   TRIG 60 03/01/2015 0218   HDL 47 03/01/2015 0218   CHOLHDL 2.6 03/01/2015 0218   VLDL 12 03/01/2015 0218   LDLCALC 61 03/01/2015 0218   No results found for: HGBA1C    Component Value Date/Time   LABOPIA NONE DETECTED 06/20/2011 2033   COCAINSCRNUR NONE DETECTED 06/20/2011 2033   LABBENZ NONE DETECTED 06/20/2011 2033   AMPHETMU NONE DETECTED 06/20/2011 2033   THCU NONE DETECTED 06/20/2011 2033   LABBARB NONE DETECTED 06/20/2011 2033     Recent Labs Lab 02/28/15 1017   ETH <5    I have personally reviewed the radiological images below and agree with the radiology interpretations.  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 Ulcerated plaque in the left carotid bulb. This could be a source of emboli.  Both vertebral arteries are patent to the basilar without significant stenosis  No significant intracranial stenosis.  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  12 mm nodule left thyroid     Ct Head Wo Contrast  03/01/2015   IMPRESSION: Mild chronic microvascular ischemic changes are stable. No hemorrhage or acute infarct.    02/28/2015   IMPRESSION: 1. No acute intracranial abnormality. 2. Atrophy and chronic microvascular white matter ischemic changes.   Mr Brain Wo Contrast  02/28/2015  IMPRESSION: Mild hyperintense signal in the left convexity white matter, favor chronic ischemia. Complete MRI of the brain is suggested for further evaluation     Dg Chest Port 1 View  03/01/2015   IMPRESSION: Evidence of a degree of congestive heart failure superimposed on emphysematous change. No airspace consolidation.    02/28/2015    IMPRESSION: Suboptimal  inspiration accounts for bibasilar atelectasis. No acute cardiopulmonary disease otherwise.   2D Echocardiogram  - Left ventricle: The cavity size was mildly dilated. Wall thickness was increased in a pattern of moderate LVH. The estimated ejection fraction was 30%. Diffuse hypokinesis. - Mitral valve: There was moderate to severe regurgitation. - Left atrium: The atrium was moderately dilated. - Right ventricle: The cavity size was mildly dilated. Systolic function was moderately reduced. - Impressions: No cardiac source of embolism was identified, but cannot be ruled out on the basis of this  examination. Impressions: - No cardiac source of embolism was identified, but cannot be ruled out on the basis of this examination.  EEG - Normal awake electroencephalogram. There are no focal lateralizing or epileptiform features.  EKG  sinus tachycardia. For complete results please see formal report.  PHYSICAL EXAM  Temp:  [97.2 F (36.2 C)-98.5 F (36.9 C)] 98.4 F (36.9 C) (05/26 1609) Pulse Rate:  [57-117] 117 (05/26 0900) Resp:  [15-69] 32 (05/26 1000) BP: (102-187)/(69-99) 150/99 mmHg (05/26 1000) SpO2:  [91 %-100 %] 94 % (05/26 0900)  General - Well nourished, well developed, in mild respiratory distress.  Ophthalmologic - fundi not visualized due to noncooperation.  Cardiovascular - Regular rhythm, tachycardia.  Neck - supple, no carotid bruits  Mental Status -  Level of arousal and orientation to time, place, and person were intact. Language including expression, naming, repetition, comprehension was assessed and found intact. Fund of Knowledge was assessed and was intact.  Cranial Nerves II - XII - II - Visual field intact OU. III, IV, VI - Extraocular movements intact. V - Facial sensation intact bilaterally. VII - Facial movement intact bilaterally. VIII - Hearing & vestibular intact bilaterally. X - Palate elevates symmetrically. XI - Chin turning & shoulder shrug intact bilaterally. XII - Tongue protrusion intact.  Motor Strength - The patient's strength was normal in all extremities and pronator drift was absent.  Bulk was normal and fasciculations were absent.   Motor Tone - Muscle tone was assessed at the neck and appendages and was normal.  Reflexes - The patient's reflexes were symmetrical in all extremities and she had no pathological reflexes.  Sensory - Light touch, temperature/pinprick were assessed and were symmetrical.    Coordination - The patient had normal movements in the hands and feet with no ataxia or dysmetria.  Tremor was  absent.  Gait and Station - The patient's transfers, posture, gait, station, and turns were observed as normal.   ASSESSMENT/PLAN [name] is a 74 y.o. female with history of current smoker admitted for altered mental status and right leg weakness. Limited MRI suspicious for left ACA stroke. Patient received TPA. Symptoms resolved.    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 large vessel atherosclerosis   MRI  pending complete study. Limited study concerning for left ACA stroke   CTA head and neck showed left ICA 50% stenosis   2D Echo  EF 30%  EEG negative for seizure  LDL 61   HgbA1c pending  SCDs  for VTE prophylaxis  Patient not compliant with medical direction, not a candidate for anticoagulation, no further embolic workup needed  DIET DYS 3 Room service appropriate?: Yes; Fluid consistency:: Thin   no antithrombotic prior to admission, now on aspirin 325 mg orally every day  Patient counseled to be compliant with her antithrombotic medications  Ongoing aggressive stroke risk factor management  Therapy recommendations:  Pending   Disposition:  Pending  Congestive heart failure  Wheezing during round  CXR showed evidence of CHF  Lasix 40 mg  Patient improved after Lasix  Continue telemetry monitoring  Hypertension  Home meds:   None Permissive hypertension (OK if <220/120) for 24-48 hours post stroke and then gradually normalized within 5-7 days. Currently on none  Stable  Patient counseled to be compliant with her blood pressure medications  Tobacco abuse  Current smoker  Smoking cessation counseling provided  Pt is not ready to quit yet  Severe malnutrition  Due to chronic illness  Dietitian on board  Nutrition supplement  Other Stroke Risk Factors  Advanced age  Other Active Problems  Modification  Not compliant with medical direction  Other Pertinent History    Hospital day # 1  This  patient is critically ill due to left ACA stroke status post TPA and congestive heart failure at significant risk of neurological worsening, death form recurrent infarct, hemorrhagic transformation, heart failure. This patient's care requires constant monitoring of vital signs, hemodynamics, respiratory and cardiac monitoring, review of multiple databases, neurological assessment, discussion with family, other specialists and medical decision making of high complexity. I spent 45 minutes of neurocritical care time in the care of this patient.   Marvel PlanJindong Zahrah Sutherlin, MD PhD Stroke Neurology 03/01/2015 4:37 PM    To contact Stroke Continuity provider, please refer to [email or web address]. After hours, contact General Neurology

## 2015-03-02 ENCOUNTER — Inpatient Hospital Stay (HOSPITAL_COMMUNITY): Payer: Medicare Other

## 2015-03-02 ENCOUNTER — Other Ambulatory Visit: Payer: Self-pay | Admitting: Cardiology

## 2015-03-02 ENCOUNTER — Encounter (HOSPITAL_COMMUNITY): Payer: Self-pay | Admitting: Cardiology

## 2015-03-02 DIAGNOSIS — I10 Essential (primary) hypertension: Secondary | ICD-10-CM | POA: Diagnosis present

## 2015-03-02 DIAGNOSIS — J441 Chronic obstructive pulmonary disease with (acute) exacerbation: Secondary | ICD-10-CM | POA: Diagnosis present

## 2015-03-02 DIAGNOSIS — I5021 Acute systolic (congestive) heart failure: Secondary | ICD-10-CM | POA: Diagnosis present

## 2015-03-02 DIAGNOSIS — Z720 Tobacco use: Secondary | ICD-10-CM | POA: Diagnosis present

## 2015-03-02 DIAGNOSIS — I429 Cardiomyopathy, unspecified: Secondary | ICD-10-CM

## 2015-03-02 DIAGNOSIS — I420 Dilated cardiomyopathy: Secondary | ICD-10-CM | POA: Insufficient documentation

## 2015-03-02 DIAGNOSIS — E43 Unspecified severe protein-calorie malnutrition: Secondary | ICD-10-CM | POA: Diagnosis present

## 2015-03-02 LAB — URINALYSIS, ROUTINE W REFLEX MICROSCOPIC
Bilirubin Urine: NEGATIVE
Glucose, UA: NEGATIVE mg/dL
Hgb urine dipstick: NEGATIVE
Ketones, ur: NEGATIVE mg/dL
Nitrite: NEGATIVE
Protein, ur: NEGATIVE mg/dL
Specific Gravity, Urine: 1.007 (ref 1.005–1.030)
Urobilinogen, UA: 1 mg/dL (ref 0.0–1.0)
pH: 6 (ref 5.0–8.0)

## 2015-03-02 LAB — URINE MICROSCOPIC-ADD ON

## 2015-03-02 LAB — RAPID URINE DRUG SCREEN, HOSP PERFORMED
Amphetamines: NOT DETECTED
Barbiturates: NOT DETECTED
Benzodiazepines: NOT DETECTED
Cocaine: NOT DETECTED
Opiates: NOT DETECTED
Tetrahydrocannabinol: NOT DETECTED

## 2015-03-02 LAB — HEMOGLOBIN A1C
Hgb A1c MFr Bld: 5.9 % — ABNORMAL HIGH (ref 4.8–5.6)
Mean Plasma Glucose: 123 mg/dL

## 2015-03-02 MED ORDER — LISINOPRIL 2.5 MG PO TABS
2.5000 mg | ORAL_TABLET | Freq: Every day | ORAL | Status: DC
  Administered 2015-03-02: 2.5 mg via ORAL
  Filled 2015-03-02: qty 1

## 2015-03-02 MED ORDER — IPRATROPIUM-ALBUTEROL 0.5-2.5 (3) MG/3ML IN SOLN
3.0000 mL | RESPIRATORY_TRACT | Status: DC
  Administered 2015-03-02 (×2): 3 mL via RESPIRATORY_TRACT
  Filled 2015-03-02 (×2): qty 3

## 2015-03-02 MED ORDER — CARVEDILOL 3.125 MG PO TABS
3.1250 mg | ORAL_TABLET | Freq: Two times a day (BID) | ORAL | Status: DC
  Administered 2015-03-02 – 2015-03-04 (×5): 3.125 mg via ORAL
  Filled 2015-03-02 (×5): qty 1

## 2015-03-02 NOTE — Progress Notes (Signed)
STROKE TEAM PROGRESS NOTE   SUBJECTIVE (INTERVAL HISTORY) Her son and daughter in law are at the bedside.  Overall she feels her condition is resolved. She continues to refuse MRI.  OBJECTIVE Temp:  [97.7 F (36.5 C)-98.2 F (36.8 C)] 98.2 F (36.8 C) (05/27 2135) Pulse Rate:  [78-100] 78 (05/27 2135) Cardiac Rhythm:  [-] Normal sinus rhythm (05/27 0800) Resp:  [20-22] 20 (05/27 2135) BP: (101-134)/(49-76) 101/49 mmHg (05/27 2135) SpO2:  [99 %-100 %] 100 % (05/27 2135) Weight:  [99 lb 1.6 oz (44.951 kg)] 99 lb 1.6 oz (44.951 kg) (05/27 0859)  No results for input(s): GLUCAP in the last 168 hours.  Recent Labs Lab 02/28/15 1017 02/28/15 1024  NA 134* 135  K 3.9 3.8  CL 102 101  CO2 21*  --   GLUCOSE 130* 134*  BUN 12 12  CREATININE 1.11* 0.90  CALCIUM 8.8*  --     Recent Labs Lab 02/28/15 1017  AST 28  ALT 18  ALKPHOS 48  BILITOT 0.5  PROT 6.4*  ALBUMIN 3.2*    Recent Labs Lab 02/28/15 1017 02/28/15 1024  WBC 4.9  --   NEUTROABS 3.4  --   HGB 11.7* 12.9  HCT 35.5* 38.0  MCV 91.7  --   PLT 175  --     Recent Labs Lab 03/01/15 1057 03/01/15 1559  TROPONINI 0.06* 0.06*    Recent Labs  02/28/15 1017  LABPROT 15.5*  INR 1.21    Recent Labs  03/02/15 1235  COLORURINE YELLOW  LABSPEC 1.007  PHURINE 6.0  GLUCOSEU NEGATIVE  HGBUR NEGATIVE  BILIRUBINUR NEGATIVE  KETONESUR NEGATIVE  PROTEINUR NEGATIVE  UROBILINOGEN 1.0  NITRITE NEGATIVE  LEUKOCYTESUR SMALL*       Component Value Date/Time   CHOL 120 03/01/2015 0218   TRIG 60 03/01/2015 0218   HDL 47 03/01/2015 0218   CHOLHDL 2.6 03/01/2015 0218   VLDL 12 03/01/2015 0218   LDLCALC 61 03/01/2015 0218   Lab Results  Component Value Date   HGBA1C 5.9* 03/01/2015      Component Value Date/Time   LABOPIA NONE DETECTED 03/02/2015 1235   COCAINSCRNUR NONE DETECTED 03/02/2015 1235   LABBENZ NONE DETECTED 03/02/2015 1235   AMPHETMU NONE DETECTED 03/02/2015 1235   THCU NONE DETECTED  03/02/2015 1235   LABBARB NONE DETECTED 03/02/2015 1235     Recent Labs Lab 02/28/15 1017  ETH <5    I have personally reviewed the radiological images below and agree with the radiology interpretations.  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 Ulcerated plaque in the left carotid bulb. This could be a source of emboli.  Both vertebral arteries are patent to the basilar without significant stenosis  No significant intracranial stenosis.  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  12 mm nodule left thyroid     Ct Head Wo Contrast  03/01/2015   IMPRESSION: Mild chronic microvascular ischemic changes are stable. No hemorrhage or acute infarct.    02/28/2015   IMPRESSION: 1. No acute intracranial abnormality. 2. Atrophy and chronic microvascular white matter ischemic changes.   Mr Brain Wo Contrast  02/28/2015  IMPRESSION: Mild hyperintense signal in the left convexity white matter, favor chronic ischemia. Complete MRI of the brain is suggested for further evaluation     Dg Chest [facility]  1 View  03/01/2015   IMPRESSION: Evidence of a degree of congestive heart failure superimposed on emphysematous change. No airspace consolidation.    02/28/2015    IMPRESSION: Suboptimal inspiration accounts for bibasilar atelectasis. No acute cardiopulmonary disease otherwise.   2D Echocardiogram  - Left ventricle: The cavity size was mildly dilated. Wall thickness was increased in a pattern of moderate LVH. The estimated ejection fraction was 30%. Diffuse hypokinesis. - Mitral valve: There was moderate to severe regurgitation. - Left atrium: The atrium was moderately dilated. - Right ventricle: The cavity size was mildly dilated. Systolic function was moderately reduced. - Impressions: No cardiac source of  embolism was identified, but cannot be ruled out on the basis of this examination. Impressions: - No cardiac source of embolism was identified, but cannot be ruled out on the basis of this examination.  EEG - Normal awake electroencephalogram. There are no focal lateralizing or epileptiform features.  EKG  sinus tachycardia. For complete results please see formal report.  PHYSICAL EXAM  Temp:  [97.7 F (36.5 C)-98.2 F (36.8 C)] 98.2 F (36.8 C) (05/27 2135) Pulse Rate:  [78-100] 78 (05/27 2135) Resp:  [20-22] 20 (05/27 2135) BP: (101-134)/(49-76) 101/49 mmHg (05/27 2135) SpO2:  [99 %-100 %] 100 % (05/27 2135) Weight:  [99 lb 1.6 oz (44.951 kg)] 99 lb 1.6 oz (44.951 kg) (05/27 0859)  General - Well nourished, well developed, in mild respiratory distress.  Ophthalmologic - fundi not visualized due to noncooperation.  Cardiovascular - Regular rhythm, tachycardia.  Neck - supple, no carotid bruits  Mental Status -  Level of arousal and orientation to time, place, and person were intact. Language including expression, naming, repetition, comprehension was assessed and found intact. Fund of Knowledge was assessed and was intact.  Cranial Nerves II - XII - II - Visual field intact OU. III, IV, VI - Extraocular movements intact. V - Facial sensation intact bilaterally. VII - Facial movement intact bilaterally. VIII - Hearing & vestibular intact bilaterally. X - Palate elevates symmetrically. XI - Chin turning & shoulder shrug intact bilaterally. XII - Tongue protrusion intact.  Motor Strength - The patient's strength was normal in all extremities and pronator drift was absent.  Bulk was normal and fasciculations were absent.   Motor Tone - Muscle tone was assessed at the neck and appendages and was normal.  Reflexes - The patient's reflexes were symmetrical in all extremities and she had no pathological reflexes.  Sensory - Light touch, temperature/pinprick were  assessed and were symmetrical.    Coordination - The patient had normal movements in the hands and feet with no ataxia or dysmetria.  Tremor was absent.  Gait and Station - The patient's transfers, posture, gait, station, and turns were observed as normal.   ASSESSMENT/PLAN Ms. ADYLENE DLUGOSZ is a 74 y.o. female with history of current smoker admitted for altered mental status and right leg weakness. Limited MRI suspicious for left ACA stroke. Patient received TPA. Symptoms resolved.    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 large vessel atherosclerosis   MRI  pending complete study. Limited study concerning for left ACA stroke   CTA head and neck showed left ICA 50% stenosis   2D Echo  EF 30%  EEG negative for seizure  LDL 61   HgbA1c pending  SCDs  for VTE prophylaxis  Patient not compliant with medical direction, not a candidate for anticoagulation, no further embolic workup needed  DIET DYS 3 Room service  appropriate?: Yes; Fluid consistency:: Thin   no antithrombotic prior to admission, now on aspirin 325 mg orally every day  Patient counseled to be compliant with her antithrombotic medications  Ongoing aggressive stroke risk factor management  Therapy recommendations:  SNF   Disposition:  SNF  Congestive heart failure  Wheezing during round  CXR showed evidence of CHF  Lasix 40 mg once  Patient improved after Lasix  Continue telemetry monitoring  Cardiology consulted and recommend to repeat 2D echo in 4 weeks and if still low EF will consider cardiac cath. However, doubt pt will follow up.   Hypertension  Home meds:   None BP goal normotnesive Currently on coreg and lisinopril  Stable  Patient counseled to be compliant with her blood pressure medications  Tobacco abuse  Current smoker  Smoking cessation counseling provided  Pt is not ready to quit yet  Severe malnutrition  Due to chronic illness  Dietitian on  board  Nutrition supplement  Other Stroke Risk Factors  Advanced age  Other Active Problems  Modification  Not compliant with medical direction  Other Pertinent History    Hospital day # 2  PT/OT recommended SNF, however, pt would like to go home. Ann MakiSone would like pt to go to SNF. However, due to long weekend, pt may need to stay for 3 more days in order to be admitted to SNF. Pt would not stay, but son insistent SNF. After lengthy, contentious discussion, pt agree to stay and will request sitter. Ask for son to stay with pt as long as possible and will try to facilitate transfer during weekend.    Marvel PlanJindong Denisse Whitenack, MD PhD Stroke Neurology 03/02/2015 10:32 PM    To contact Stroke Continuity provider, please refer to [email or web address]. After hours, contact General Neurology

## 2015-03-02 NOTE — Evaluation (Addendum)
Physical Therapy Evaluation Patient Details Name: Tammy [name] MRN: [id number] DOB: Nov 17, 1940 Today's Date: 03/02/2015   History of Present Illness  Patient is an 74 y.o. female history of confusion/cognitive decline presenting with acute onset of altered mental status and right sided weakness (LE>UE). Per EMS patient also initially non-verbal and not following commands. Complete MRI pending; initial MRI with ischemic changes and Neurologist ?'s acute CVA  Clinical Impression  Patient demonstrates deficits in functional mobility as indicated below. Will need continued skilled PT to address deficits and maximize function. Will see as indicated and progress as tolerated.    Follow Up Recommendations SNF    Equipment Recommendations  None recommended by PT    Recommendations for Other Services       Precautions / Restrictions Precautions Precautions: Fall Restrictions Weight Bearing Restrictions: No      Mobility  Bed Mobility               General bed mobility comments: already seated EOB -- not tested  Transfers Overall transfer level: Needs assistance Equipment used: None Transfers: Sit to/from Stand Sit to Stand: Supervision            Ambulation/Gait Ambulation/Gait assistance: Supervision;Min guard Ambulation Distance (Feet): 90 Feet Assistive device: None Gait Pattern/deviations: Step-through pattern;Decreased stride length;Drifts right/left;Trunk flexed;Narrow base of support Gait velocity: decreased Gait velocity interpretation: Below normal speed for age/gender General Gait Details: instability noted, patient able to self correct, no physical assist required. Patient appears to have gait dysfunction at baseline secondary to arthritic changes, and generalized weakness and deconditioning  Stairs Stairs: Yes Stairs assistance: Modified independent (Device/Increase time) Stair Management: Two rails;Step to pattern Number of Stairs: 5 General  stair comments: No physical assist required, increased time to perform.   Wheelchair Mobility    Modified Rankin (Stroke Patients Only) Modified Rankin (Stroke Patients Only) Pre-Morbid Rankin Score: Slight disability Modified Rankin: Moderate disability     Balance Overall balance assessment: History of Falls                                           Pertinent Vitals/Pain      Home Living Family/patient expects to be discharged to:: Private residence Living Arrangements: Alone Available Help at Discharge: Family;Available PRN/intermittently (family works) Type of Home: House Home Access: Level entry     Home Layout: One level Home Equipment: None      Prior Function Level of Independence: Independent         Comments: drives, did all IADLs PTA     Hand Dominance   Dominant Hand: Right    Extremity/Trunk Assessment               Lower Extremity Assessment: Generalized weakness (+ arthritic changes)      Cervical / Trunk Assessment: Normal  Communication   Communication: HOH  Cognition Arousal/Alertness: Awake/alert Behavior During Therapy:  (angry) Overall Cognitive Status: Impaired/Different from baseline Area of Impairment: Memory;Safety/judgement;Awareness;Problem solving     Memory: Decreased short-term memory   Safety/Judgement: Decreased awareness of safety;Decreased awareness of deficits Awareness: Emergent Problem Solving: Requires verbal cues General Comments: Patient with poor carryover for memory skills, discussed plan of events for today to ensure discharge and patient continuously asking and re asking questions regarding what the plan was and if she could go home    General Comments General comments (skin integrity, edema, etc.):  pt with deconditioned apearence    Exercises        Assessment/Plan    PT Assessment Patient needs continued PT services  PT Diagnosis Difficulty walking;Abnormality of  gait;Generalized weakness   PT Problem List Decreased strength;Decreased activity tolerance;Decreased balance;Decreased mobility;Decreased coordination;Decreased cognition;Decreased safety awareness  PT Treatment Interventions DME instruction;Gait training;Functional mobility training;Therapeutic activities;Therapeutic exercise;Balance training;Patient/family education   PT Goals (Current goals can be found in the Care Plan section) Acute Rehab PT Goals Patient Stated Goal: to go home today PT Goal Formulation: With patient/family Time For Goal Achievement: 03/16/15 Potential to Achieve Goals: Good    Frequency Min 3X/week   Barriers to discharge        Co-evaluation               End of Session Equipment Utilized During Treatment: Gait belt Activity Tolerance: No increased pain Patient left:  (with transport for MRI) Nurse Communication: Mobility status         Time: 8119-1478 PT Time Calculation (min) (ACUTE ONLY): 13 min   Charges:    1 PT Visit   1 Evaluation     PT G CodesFabio Asa Mar 17, 2015, 4:30 PM Charlotte Crumb, PT DPT  [phone]   Addendum for SNF - spoke with evaluating therapist via telephone. "Family now unable to provide assist as initially indicated. Patient will need ST-SNF."  Sunday Spillers [place], [place] [phone]

## 2015-03-02 NOTE — Progress Notes (Signed)
Occupational Therapy Evaluation Patient Details Name: Tammy [name] MRN: [id number] DOB: 1940-11-15 Today's Date: 03/02/2015    History of Present Illness Patient is an 74 y.o. female history of confusion/cognitive decline presenting with acute onset of altered mental status and right sided weakness (LE>UE). Per EMS patient also initially non-verbal and not following commands. Complete MRI pending; initial MRI with ischemic changes and Neurologist ?'s acute CVA   Clinical Impression   Patient presents with more cognitive than physical deficits making her unsafe to be alone at home. Recommend SNF but it is unlikely that she will agree as she is demanding to go home throughout my evaluation. She needs 24/7 supervision but reports her family works and cannot provide this. Patient believes she can go home and that she is functioning normally. See below for deficits impacting ADL performance. OT will follow.    Follow Up Recommendations  Supervision/Assistance - 24 hour;SNF    Equipment Recommendations  Tub/shower seat    Recommendations for Other Services PT consult;Speech consult     Precautions / Restrictions Precautions Precautions: Fall Restrictions Weight Bearing Restrictions: No      Mobility Bed Mobility               General bed mobility comments: already seated EOB -- not tested  Transfers                      Balance                                            ADL Overall ADL's : Needs assistance/impaired Eating/Feeding: Independent   Grooming: Supervision/safety;Standing   Upper Body Bathing: Set up;Supervision/ safety;Sitting   Lower Body Bathing: Min guard;Sit to/from stand   Upper Body Dressing : Set up;Supervision/safety;Sitting   Lower Body Dressing: Min guard;Sit to/from stand (can don/doff socks in sitting independently)   Toilet Transfer: Min guard;Ambulation;Comfort height toilet           Functional  mobility during ADLs: Min guard General ADL Comments: Patient angry, demanding to go home. Sitter at bedside. Patient reports, "I feel like a new woman." Patient had difficulty recalling why she was in the hospital and demonstrated decreased safety, decreased awareness, decreased problem solving during evaluation. She was not agreeable to most of the evaluation. She reports she lives alone and has family that can check in intermittently but they all work. My overall impression is that she will not be able to live alone.      Vision     Perception     Praxis      Pertinent Vitals/Pain Pain Assessment: No/denies pain     Hand Dominance Right   Extremity/Trunk Assessment Upper Extremity Assessment Upper Extremity Assessment: Overall WFL for tasks assessed   Lower Extremity Assessment Lower Extremity Assessment: Defer to PT evaluation   Cervical / Trunk Assessment Cervical / Trunk Assessment: Normal   Communication     Cognition Arousal/Alertness: Awake/alert Behavior During Therapy:  (angry) Overall Cognitive Status: Impaired/Different from baseline Area of Impairment: Memory;Safety/judgement;Awareness;Problem solving     Memory: Decreased short-term memory   Safety/Judgement: Decreased awareness of safety;Decreased awareness of deficits Awareness: Emergent Problem Solving: Requires verbal cues General Comments: Patient has a sitter at the bedside   General Comments       Exercises       Shoulder Instructions  Home Living Family/patient expects to be discharged to:: Private residence Living Arrangements: Alone Available Help at Discharge: Family;Available PRN/intermittently (family works) Type of Home: House Home Access: Level entry     Home Layout: One level     Bathroom Shower/Tub: Chief Strategy OfficerTub/shower unit   Bathroom Toilet: Standard     Home Equipment: None      Lives With: Alone    Prior Functioning/Environment Level of Independence: Independent         Comments: drives, did all IADLs PTA    OT Diagnosis: Cognitive deficits;Generalized weakness;Altered mental status   OT Problem List: Impaired balance (sitting and/or standing);Decreased cognition;Decreased safety awareness   OT Treatment/Interventions: Self-care/ADL training;Therapeutic activities;Cognitive remediation/compensation;Patient/family education    OT Goals(Current goals can be found in the care plan section) Acute Rehab OT Goals Patient Stated Goal: to go home today OT Goal Formulation: With patient Time For Goal Achievement: 03/16/15 Potential to Achieve Goals: Fair  OT Frequency: Min 2X/week   Barriers to D/C: Decreased caregiver support  does not have 24/7 assist available       Co-evaluation              End of Session    Activity Tolerance: Patient tolerated treatment well;Treatment limited secondary to agitation Patient left: in bed;with call bell/phone within reach;with nursing/sitter in room   Time: 0906-0920 OT Time Calculation (min): 14 min Charges:  OT General Charges $OT Visit: 1 Procedure OT Evaluation $Initial OT Evaluation Tier I: 1 Procedure G-Codes:    Edenilson Austad A 03/02/2015, 9:38 AM

## 2015-03-02 NOTE — Care Management Note (Signed)
Case Management Note  Patient Details  Name: Tammy [name] MRN: [id number] Date of Birth: Jun 12, 1941  Subjective/Objective:                    Action/Plan: Met with patient's son Venora Maples to discuss discharge needs. Patient is currently declining the recommended SNF placement.  Family is abiding by patient's wishes and requested more information.  Patient will require 24 hr supervision for safety.  CM discussed home health services as well as private duty agency services.  Patient's son has chosen [facility] and was also provided with the private duty agency list.  CM spoke with the Stroke team PA, who is placing orders for [facility] PT/OT/CSW/HH aide.  Miranda with [facility] was notified and has accepted the referral for potential discharge home today.  PLEASE CONTACT PATIENT'S SON ANDRE FOR APPOINTMENTS.  [phone].  Patient's address is correct in the system.   Expected Discharge Date:                  Expected Discharge Plan:  [place]  In-House Referral:  Clinical Social Work  Discharge planning Services  CM Consult  Post Acute Care Choice:    Choice offered to:  Adult Children  DME Arranged:    DME Agency:     HH Arranged:  PT, OT, Nurse's Aide [place] Agency:  [facility]  Status of Service:  Completed, signed off  Medicare Important Message Given:  N/A - LOS <3 / Initial given by admissions Date Medicare IM Given:    Medicare IM give by:    Date Additional Medicare IM Given:    Additional Medicare Important Message give by:     If discussed at [place] of Stay Meetings, dates discussed:    Additional Comments:  Rolm Baptise, RN 03/02/2015, 3:33 PM

## 2015-03-02 NOTE — Consult Note (Addendum)
Patient ID: Tammy [name] MRN: [id number], DOB/AGE: 75-Jul-1942   Admit date: 02/28/2015   Primary Physician: Cala Bradford, MD Primary Cardiologist: New  Pt. Profile:  74 y/o female, with h/o dementia, heavy tobacco use and no prior cardiac history, admitted for acute CVA. Found to have severe LV dysfunction on 2D echo with EF at 30% and moderate right ventricular dysfunction.   Problem List  Past Medical History  Diagnosis Date  . Insomnia     History reviewed. No pertinent past surgical history.   Allergies  No Known Allergies  HPI  The patient is a 74 y/o AAF with a history of dementia and heavy tobacco use. No prior cardiac history. Denies alcohol abuse.  She presented to [facility] on 02/28/15 with AMS and right sided weakness. W/u revealed dominant left ACA infarct s/p TPA, embolic vs. large vessel atherosclerosis on CT (pt refused MRI). Visualization of the carotids on head/neck CT also demonstrated 50% diameter stenosis of the proximal left internal carotid artery with an ulcerated plaque in the left carotid bulb, which could be a source of emboli.  Additional w/u included a 2D echo. No cardiac source of embolism was identified on TTE however she was noted to have severe LV dysfunction with an EF of 30%, with diffuse hypokinesis and moderate to severe mitral regurgitation. The left atrium is moderately dilated. The right ventricle is mildly dilated. Right ventricular systolic function is moderately reduced. Cardiology has been consulted for recommendations regarding her newly diagnosed HF. She is well compensated w/o dyspnea and no peripheral edema. Lungs are CTAB. She denies any exertional dyspnea, no CP, syncope/ near syncope. Denies palpitations. She reports that her right sided weakness has resolved. Per notes, "Pt is noncomplianrt with therapies and medical direction and has significant issues with memory so deemed not a candidate for anticoagulation".   Note: Primary team  was anticipating discharge home today.    Home Medications  Prior to Admission medications   Medication Sig Start Date End Date Taking? Authorizing Provider  Melatonin 1 MG CAPS Take 1 capsule by mouth at bedtime.   Yes Historical Provider, MD    Family History  Family History  Problem Relation Age of Onset  . Congestive Heart Failure Son 70    Social History  History   Social History  . Marital Status: Single    Spouse Name: N/A  . Number of Children: N/A  . Years of Education: N/A   Occupational History  . Not on file.   Social History Main Topics  . Smoking status: Current Every Day Smoker    Types: Cigarettes  . Smokeless tobacco: Not on file  . Alcohol Use: No  . Drug Use: No  . Sexual Activity: Not on file   Other Topics Concern  . Not on file   Social History Narrative     Review of Systems General:  No chills, fever, night sweats or weight changes.  Cardiovascular:  No chest pain, dyspnea on exertion, edema, orthopnea, palpitations, paroxysmal nocturnal dyspnea. Dermatological: No rash, lesions/masses Respiratory: No cough, dyspnea Urologic: No hematuria, dysuria Abdominal:   No nausea, vomiting, diarrhea, bright red blood per rectum, melena, or hematemesis Neurologic:  No visual changes, wkns, changes in mental status. All other systems reviewed and are otherwise negative except as noted above.  Physical Exam  Blood pressure 132/64, pulse 97, temperature 97.7 F (36.5 C), temperature source Oral, resp. rate 22, height  (1.702 m), weight 99 lb 1.6 oz (44.951  kg), SpO2 100 %.  General: Pleasant, NAD Psych: Normal affect. Neuro: Alert and oriented X 3. Moves all extremities spontaneously. HEENT: Normal  Neck: Supple, mildly elevated JVD. Lungs:  Resp regular and unlabored, CTA. Heart: RRR no s3, s4, or murmurs. Abdomen: Soft, non-tender, non-distended, BS + x 4.  Extremities: No clubbing, cyanosis or edema. DP/PT/Radials 2+ and equal  bilaterally.  Labs  Troponin [facility] of Care Test)  Recent Labs  02/28/15 1023  TROPIPOC 0.00    Recent Labs  03/01/15 1057 03/01/15 1559  TROPONINI 0.06* 0.06*   Lab Results  Component Value Date   WBC 4.9 02/28/2015   HGB 12.9 02/28/2015   HCT 38.0 02/28/2015   MCV 91.7 02/28/2015   PLT 175 02/28/2015     Recent Labs Lab 02/28/15 1017 02/28/15 1024  NA 134* 135  K 3.9 3.8  CL 102 101  CO2 21*  --   BUN 12 12  CREATININE 1.11* 0.90  CALCIUM 8.8*  --   PROT 6.4*  --   BILITOT 0.5  --   ALKPHOS 48  --   ALT 18  --   AST 28  --   GLUCOSE 130* 134*   Lab Results  Component Value Date   CHOL 120 03/01/2015   HDL 47 03/01/2015   LDLCALC 61 03/01/2015   TRIG 60 03/01/2015   No results found for: DDIMER   Radiology/Studies  Ct Angio Head W/cm &/or Wo Cm  02/28/2015   CLINICAL DATA:  Stroke  EXAM: CT ANGIOGRAPHY HEAD AND NECK  TECHNIQUE: Multidetector CT imaging of the head and neck was performed using the standard protocol during bolus administration of intravenous contrast. Multiplanar CT image reconstructions and MIPs were obtained to evaluate the vascular anatomy. Carotid stenosis measurements (when applicable) are obtained utilizing NASCET criteria, using the distal internal carotid diameter as the denominator.  CONTRAST:  50mL OMNIPAQUE IOHEXOL 350 MG/ML SOLN  COMPARISON:  MRI 06/28/2011.  CT head from earlier today  FINDINGS: CT HEAD  Brain: Negative for acute infarct. Mild chronic microvascular ischemic change in the white matter. Mild atrophy. Negative for hemorrhage.  Calvarium and skull base: Negative  Paranasal sinuses: Negative  Orbits: Negative  CTA NECK  Aortic arch: Mild atherosclerotic disease in the aortic arch and origin of the innominate and left subclavian arteries. Left carotid origin is off the innominate artery. Subclavian arteries are widely patent.  Right carotid system: Scattered atherosclerotic calcification in the common carotid artery.  Atherosclerotic calcification in the carotid bulb without significant stenosis.  Left carotid system: Atherosclerotic calcification in the left common carotid artery narrowing the lumen by approximately 30% diameter stenosis. Atherosclerotic plaque in the carotid bulb which is noncalcified. This plaque is ulcerated. Focal stenosis at the origin of the left internal carotid narrowing the lumen by 50% diameter stenosis.  Vertebral arteries:Both vertebral arteries are patent to the basilar without significant stenosis or dissection.  Skeleton: Advanced multilevel disc degeneration and spondylosis. 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 Severe foraminal encroachment bilaterally C4-5 and C5-6. Moderate spinal stenosis and foraminal stenosis bilaterally at C6-7.  Other neck: 12 mm low-density lesion left thyroid gland. No adenopathy in the neck  CTA HEAD  Anterior circulation: Atherosclerotic calcification in the cavernous carotid bilaterally without significant stenosis. Anterior and middle cerebral arteries are patent bilaterally without stenosis or aneurysm.  Posterior circulation: Both vertebral arteries are patent to the basilar without stenosis. PICA patent bilaterally. Basilar is widely patent. Left AICA  patent. Bilateral superior cerebellar and posterior cerebral arteries widely patent. Fetal origin of the right posterior cerebral artery. No aneurysm in the posterior circulation.  Venous sinuses: Patent  Anatomic variants: As above  Delayed phase: Not performed  IMPRESSION: 50% diameter stenosis of the proximal left internal carotid artery. Ulcerated plaque in the left carotid bulb. This could be a source of emboli.  Both vertebral arteries are patent to the basilar without significant stenosis  No significant intracranial stenosis.  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  12 mm nodule left thyroid  Critical Value/emergent results were called by telephone at the time of interpretation on 02/28/2015 at 10:35 Am to Dr. Elspeth Cho , who verbally acknowledged these results.   Electronically Signed   By: Marlan Palau M.D.   On: 02/28/2015 11:31   Dg Chest 1 View  03/02/2015   CLINICAL DATA:  Followup chest x-ray for wheezing.  EXAM: CHEST  1 VIEW  COMPARISON:  03/01/2015  FINDINGS: Lungs are adequately inflated without consolidation or effusion. There is evidence of a few small sub cm bilateral nodule opacities some which may represent EKG leads. There is mild stable cardiomegaly. Calcified plaque is present over the aortic arch. There are degenerative changes of the spine.  IMPRESSION: No acute cardiopulmonary disease.  Mild stable cardiomegaly.  Few small sub cm bilateral pulmonary nodules some of which may represent external EKG leads. Recommend followup chest x-ray 4 weeks.   Electronically Signed   By: Elberta Fortis M.D.   On: 03/02/2015 11:12   Ct Head Wo Contrast  03/01/2015   CLINICAL DATA:  Stroke.  24 hour  post tPA administration  EXAM: CT HEAD WITHOUT CONTRAST  TECHNIQUE: Contiguous axial images were obtained from the base of the skull through the vertex without intravenous contrast.  COMPARISON:  Limited MRI head 02/28/2015.  CT head 02/28/2015  FINDINGS: Negative for intracranial hemorrhage.  Patchy hypodensity in the periventricular white matter bilaterally is unchanged and most consistent with mild chronic microvascular ischemia. No definite acute infarct or mass. No shift of the midline structures  Calvarium intact.  IMPRESSION: Mild chronic microvascular ischemic changes are stable. No hemorrhage or acute infarct.   Electronically Signed   By: Marlan Palau M.D.   On: 03/01/2015 11:40   Ct Head Wo Contrast  02/28/2015   CLINICAL DATA:  Altered mental status, leaning to the right.  EXAM: CT HEAD WITHOUT CONTRAST  TECHNIQUE: Contiguous axial images were  obtained from the base of the skull through the vertex without intravenous contrast.  COMPARISON:  MR brain 06/28/2011 and CT head 06/20/2011.  FINDINGS: No evidence of an acute infarct, acute hemorrhage, mass lesion, mass effect or hydrocephalus. Atrophy. Periventricular low attenuation is mild. Visualized portions of the paranasal sinuses and mastoid air cells are clear.  IMPRESSION: 1. No acute intracranial abnormality. 2. Atrophy and chronic microvascular white matter ischemic changes.   Electronically Signed   By: Leanna Battles M.D.   On: 02/28/2015 10:42   Ct Angio Neck W/cm &/or Wo/cm  02/28/2015   CLINICAL DATA:  Stroke  EXAM: CT ANGIOGRAPHY HEAD AND NECK  TECHNIQUE: Multidetector CT imaging of the head and neck was performed using the standard protocol during bolus administration of intravenous contrast. Multiplanar CT image reconstructions and MIPs were obtained to evaluate the vascular anatomy. Carotid stenosis measurements (when applicable) are obtained utilizing NASCET criteria, using the distal internal carotid  diameter as the denominator.  CONTRAST:  50mL OMNIPAQUE IOHEXOL 350 MG/ML SOLN  COMPARISON:  MRI 06/28/2011.  CT head from earlier today  FINDINGS: CT HEAD  Brain: Negative for acute infarct. Mild chronic microvascular ischemic change in the white matter. Mild atrophy. Negative for hemorrhage.  Calvarium and skull base: Negative  Paranasal sinuses: Negative  Orbits: Negative  CTA NECK  Aortic arch: Mild atherosclerotic disease in the aortic arch and origin of the innominate and left subclavian arteries. Left carotid origin is off the innominate artery. Subclavian arteries are widely patent.  Right carotid system: Scattered atherosclerotic calcification in the common carotid artery. Atherosclerotic calcification in the carotid bulb without significant stenosis.  Left carotid system: Atherosclerotic calcification in the left common carotid artery narrowing the lumen by approximately 30%  diameter stenosis. Atherosclerotic plaque in the carotid bulb which is noncalcified. This plaque is ulcerated. Focal stenosis at the origin of the left internal carotid narrowing the lumen by 50% diameter stenosis.  Vertebral arteries:Both vertebral arteries are patent to the basilar without significant stenosis or dissection.  Skeleton: Advanced multilevel disc degeneration and spondylosis. 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 Severe foraminal encroachment bilaterally C4-5 and C5-6. Moderate spinal stenosis and foraminal stenosis bilaterally at C6-7.  Other neck: 12 mm low-density lesion left thyroid gland. No adenopathy in the neck  CTA HEAD  Anterior circulation: Atherosclerotic calcification in the cavernous carotid bilaterally without significant stenosis. Anterior and middle cerebral arteries are patent bilaterally without stenosis or aneurysm.  Posterior circulation: Both vertebral arteries are patent to the basilar without stenosis. PICA patent bilaterally. Basilar is widely patent. Left AICA patent. Bilateral superior cerebellar and posterior cerebral arteries widely patent. Fetal origin of the right posterior cerebral artery. No aneurysm in the posterior circulation.  Venous sinuses: Patent  Anatomic variants: As above  Delayed phase: Not performed  IMPRESSION: 50% diameter stenosis of the proximal left internal carotid artery. Ulcerated plaque in the left carotid bulb. This could be a source of emboli.  Both vertebral arteries are patent to the basilar without significant stenosis  No significant intracranial stenosis.  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  12 mm nodule left thyroid  Critical Value/emergent results were called by telephone at the time of interpretation on 02/28/2015 at 10:35 Am to Dr. Elspeth Cho , who verbally acknowledged these  results.   Electronically Signed   By: Marlan Palau M.D.   On: 02/28/2015 11:31   Mr Brain Wo Contrast  02/28/2015   CLINICAL DATA:  Code stroke.  Altered mental status.  EXAM: MRI HEAD WITHOUT CONTRAST  TECHNIQUE: Multiplanar, multiecho pulse sequences of the brain and surrounding structures were obtained without intravenous contrast.  COMPARISON:  CT head 02/28/2015.  MRI head 06/28/2011  FINDINGS: Axial diffusion imaging only was performed. No other sequences were performed per neurology.  Mild hyperintense signal is present in the left frontal parietal white matter . Cortex does not appear to be involved. It is difficult determine if this is T2 shine through from chronic ischemia or possibly acute infarct. I would favor this is chronic. Complete MRI of the brain would be helpful for further evaluation.  IMPRESSION: Mild hyperintense signal in the left convexity white matter, favor chronic ischemia. Complete MRI of the brain is suggested for further evaluation  I reviewed the  findings with the neurology PA.   Electronically Signed  By: Marlan Palau M.D.   On: 02/28/2015 12:06   Dg Chest Port 1 View  03/01/2015   CLINICAL DATA:  Shortness of Breath  EXAM: PORTABLE CHEST - 1 VIEW  COMPARISON:  Feb 28, 2015  FINDINGS: Suspect a degree of underlying emphysematous change. There is slight edema in the bases. Lungs elsewhere clear. Heart is mildly enlarged with a mild degree of pulmonary venous hypertension. No adenopathy. There is atherosclerotic change in the aorta. There is mid thoracic dextroscoliosis.  IMPRESSION: Evidence of a degree of congestive heart failure superimposed on emphysematous change. No airspace consolidation.   Electronically Signed   By: Bretta Bang III M.D.   On: 03/01/2015 09:04   Dg Chest Portable 1 View  02/28/2015   CLINICAL DATA:  Code stroke.  Patient nonverbal.  EXAM: PORTABLE CHEST - 1 VIEW  COMPARISON:  01/06/2015, 04/04/2009.  FINDINGS: Cardiac silhouette upper  normal in size to slightly enlarged, accentuated by portable technique and the less than optimal inspiration. Suboptimal inspiration also accounts for atelectasis in the lung bases. Lungs otherwise clear. No visible pleural effusions.  IMPRESSION: Suboptimal inspiration accounts for bibasilar atelectasis. No acute cardiopulmonary disease otherwise.   Electronically Signed   By: Hulan Saas M.D.   On: 02/28/2015 12:35    ECG  Sinus tach. Telemetry shows NSR. HR 93 bpm.   Echocardiogram 02/28/15  Study Conclusions  - Left ventricle: The cavity size was mildly dilated. Wall thickness was increased in a pattern of moderate LVH. The estimated ejection fraction was 30%. Diffuse hypokinesis. - Mitral valve: There was moderate to severe regurgitation. - Left atrium: The atrium was moderately dilated. - Right ventricle: The cavity size was mildly dilated. Systolic function was moderately reduced. - Impressions: No cardiac source of embolism was identified, but cannot be ruled out on the basis of this examination.  Impressions:  - No cardiac source of embolism was identified, but cannot be ruled out on the basis of this examination.     ASSESSMENT AND PLAN  Principal Problem:   Stroke Active Problems:   CVA (cerebral infarction)   Acute systolic congestive heart failure, NYHA class 3   COPD exacerbation   Tobacco abuse   Severe protein-calorie malnutrition   HTN (hypertension)   1. Biventricular Cardiomyopathy: new diagnosis. 2D echo 5/25 showed severely reduced LV systolic function with EF of 30%, along with diffuse hypokinesis and moderate to severe mitral regurgitation. The right ventricle is mildly dilated with moderately reduced systolic function. She is well compensated without dyspnea. Euvolemic w/o edema. Looks cachectic. She denies chest pain. Patient was planned to be discharged home today by primary team. Would recommend further w/u as an OP. She will need  repeat 2D echo in 1 month. If no improvement in EF, would pursue a R/LHC. ? even a TEE to further visualize her mitral valve. Agree with continuing Coreg and Lisinopril for LV dysfunction. If discharged home today, she will need repeat BMP in 1 week, after starting lisinopril. Will need education on heart failure (low sodium diet, medication compliance and daily weights). Smoking cessation strongly advised. Add 20 mg PO Lasix,   PRN, for lower extremity edema.   2. Acute CVA: s/p TPA with resolution of symptoms. ? TEE to r/o LA thrombus and consider Loop if negative to asess for atrial fibrillation. Recommend adding a statin given her recent stroke and evidence of carotid artery disease with ulcerated plaque in the left carotid bulb. Continue ASA.    Signed, [name], BRITTAINY,  PA-C 03/02/2015, 3:16 PM   .Patient seen and examined with Tammy LisBrittainy Simmons, PA-C. We discussed all aspects of the encounter. I agree with the assessment and plan as stated above. 74 y/o smoker with mild dementia admitted with acute CVA s/p t-pa treatment. Incidental finding of biventricular cardiomyopathy with EF 30%. Denies CP or SOB. By echo seems like it may be NICM insetting of acute illness but at high risk for underlying CAD. Given lack of symptoms and recent stroke would not pursue invasive w/u at this time. Agree with ASA, b-blocker, lisinopril and statin as you have done. May also need prn lasix. She initially refused to take any medicine but then said she would consider. Would repeat echo in 4 weeks and if EF still down can reconsider cath at that time. Discussed with son and h now wants to consider SNF.   Ideally would require loop monitor for CVA but she is not candidate for anticoagulation so would not pursue.  Maison Agrusa,MD 4:14 PM

## 2015-03-02 NOTE — Consult Note (Signed)
[facility] Consultation Note                                                                                    Tammy [name], is a 74 y.o. female  MRN: [id number]   DOB - September 28, 1941  Admit Date - 02/28/2015  Outpatient Primary MD for the patient is Cala Bradford, MD  Referring MD: Marvel Plan / Neurology  Reason for consultation: Manage  medical care   With History of -  Past Medical History  Diagnosis Date  . Insomnia       History reviewed. No pertinent past surgical history.  in for   Chief Complaint  Patient presents with  . Code Stroke     HPI This is a 74 yo female admitted 5/25 with stroke sx's superimposed on known progressive congnitive decline. WU revealed Dominant left ACA infarct s/p TPA, embolic vs. large vessel atherosclerosis on CT (pt refused MRI). Pt noncomplianrt with therapies and medical direction and has significant issues with memory so deemed not candidate for anticoagulation. ECHO this admit NEW EF 30% with sx's of CHF on 5/26 that responded to 1x dose IV Lasix. We have been asked to assume medical care.  History difficult; pt mildly antagonistic stating she doesn't like "white people" and eventually stopped answering questions when asked.   Review of Systems   In addition to the HPI above,  No Fever-chills, myalgias or other constitutional symptoms No Headache, changes with Vision or hearing, new weakness, tingling, numbness in any extremity, No problems swallowing food or Liquids, indigestion/reflux No Chest pain, palpitations, orthopnea or DOE No Abdominal pain, N/V; no melena or hematochezia, no dark tarry stools, Bowel movements are regular, No dysuria, hematuria or flank pain No new skin rashes, lesions, masses or bruises, No new joints pains-aches No polyuria, polydypsia or polyphagia,  *A full 10 point Review of Systems was done, except as stated above, all other Review of Systems were negative.  Social History History    Substance Use Topics  . Smoking status: Current Every Day Smoker    Types: Cigarettes  . Smokeless tobacco: Not on file  . Alcohol Use: No    Resides at: private resdidence  Lives with: alone  Ambulatory status: unassissted   Family History Pt denies significant family history   Prior to Admission medications   Medication Sig Start Date End Date Taking? Authorizing Provider  amitriptyline (ELAVIL) 10 MG tablet Take 10 mg by mouth at bedtime.    Historical Provider, MD  BELSOMRA 15 MG TABS Take 15 mg by mouth at bedtime as needed. 12/19/14   Historical Provider, MD  ROZEREM 8 MG tablet Take 8 mg by mouth at bedtime as needed. 12/20/14   Historical Provider, MD    No Known Allergies  Physical Exam  Vitals  Blood pressure 141/76, pulse 104, temperature 99.7 F (37.6 C), temperature source Oral, resp. rate 20, height 5\' 7"  (1.702 m), weight 96 lb 9 oz (43.8 kg), SpO2 100 %.   General:  In no acute distress, appears healthy and well nourished  Psych:  Animated but easily agitated especially when asked to interact with staff or when asked  questions, has short term memory deficits and has no insight into her current medical status  Neuro:   No focal neurological deficits, CN II through XII intact, Strength 5/5 all 4 extremities, Sensation intact all 4 extremities.  ENT:  Ears and Eyes appear Normal, Conjunctivae clear, PER. Moist oral mucosa without erythema or exudates. Extremely poor dentition  Neck:  Supple, No lymphadenopathy appreciated  Respiratory:  Symmetrical chest wall movement, Good air movement bilaterally, coarse exp wheezes and some crackles. Room Air  Cardiac:  RRR, No Murmurs, no LE edema noted, no JVD, No carotid bruits, peripheral pulses palpable at 2+  Abdomen:  Positive bowel sounds, Soft, Non tender, Non distended,  No masses appreciated, no obvious hepatosplenomegaly  Skin:  No Cyanosis, Normal Skin Turgor, No Skin Rash or Bruise.  Extremities:  Symmetrical without obvious trauma or injury,  no effusions.  Data Review  CBC  Recent Labs Lab 02/28/15 1017 02/28/15 1024  WBC 4.9  --   HGB 11.7* 12.9  HCT 35.5* 38.0  PLT 175  --   MCV 91.7  --   MCH 30.2  --   MCHC 33.0  --   RDW 12.3  --   LYMPHSABS 0.9  --   MONOABS 0.5  --   EOSABS 0.0  --   BASOSABS 0.0  --     Chemistries   Recent Labs Lab 02/28/15 1017 02/28/15 1024  NA 134* 135  K 3.9 3.8  CL 102 101  CO2 21*  --   GLUCOSE 130* 134*  BUN 12 12  CREATININE 1.11* 0.90  CALCIUM 8.8*  --   AST 28  --   ALT 18  --   ALKPHOS 48  --   BILITOT 0.5  --     estimated creatinine clearance is 37.3 mL/min (by C-G formula based on Cr of 0.9).  No results for input(s): TSH, T4TOTAL, T3FREE, THYROIDAB in the last 72 hours.  Invalid input(s): FREET3  Coagulation profile  Recent Labs Lab 02/28/15 1017  INR 1.21    No results for input(s): DDIMER in the last 72 hours.  Cardiac Enzymes  Recent Labs Lab 03/01/15 1057 03/01/15 1559  TROPONINI 0.06* 0.06*    Invalid input(s): POCBNP  Urinalysis    Component Value Date/Time   COLORURINE YELLOW 06/27/2011 1544   APPEARANCEUR CLEAR 06/27/2011 1544   LABSPEC 1.003* 06/27/2011 1544   PHURINE 7.0 06/27/2011 1544   GLUCOSEU NEGATIVE 06/27/2011 1544   HGBUR NEGATIVE 06/27/2011 1544   BILIRUBINUR NEGATIVE 06/27/2011 1544   KETONESUR NEGATIVE 06/27/2011 1544   PROTEINUR NEGATIVE 06/27/2011 1544   UROBILINOGEN 0.2 06/27/2011 1544   NITRITE NEGATIVE 06/27/2011 1544   LEUKOCYTESUR TRACE* 06/27/2011 1544    Imaging results:   Ct Angio Head W/cm &/or Wo Cm  02/28/2015   CLINICAL DATA:  Stroke  EXAM: CT ANGIOGRAPHY HEAD AND NECK  TECHNIQUE: Multidetector CT imaging of the head and neck was performed using the standard protocol during bolus administration of intravenous contrast. Multiplanar CT image reconstructions and MIPs were obtained to evaluate the vascular anatomy. Carotid stenosis measurements  (when applicable) are obtained utilizing NASCET criteria, using the distal internal carotid diameter as the denominator.  CONTRAST:  50mL OMNIPAQUE IOHEXOL 350 MG/ML SOLN  COMPARISON:  MRI 06/28/2011.  CT head from earlier today  FINDINGS: CT HEAD  Brain: Negative for acute infarct. Mild chronic microvascular ischemic change in the white matter. Mild atrophy. Negative for hemorrhage.  Calvarium and skull base: Negative  Paranasal sinuses:  Negative  Orbits: Negative  CTA NECK  Aortic arch: Mild atherosclerotic disease in the aortic arch and origin of the innominate and left subclavian arteries. Left carotid origin is off the innominate artery. Subclavian arteries are widely patent.  Right carotid system: Scattered atherosclerotic calcification in the common carotid artery. Atherosclerotic calcification in the carotid bulb without significant stenosis.  Left carotid system: Atherosclerotic calcification in the left common carotid artery narrowing the lumen by approximately 30% diameter stenosis. Atherosclerotic plaque in the carotid bulb which is noncalcified. This plaque is ulcerated. Focal stenosis at the origin of the left internal carotid narrowing the lumen by 50% diameter stenosis.  Vertebral arteries:Both vertebral arteries are patent to the basilar without significant stenosis or dissection.  Skeleton: Advanced multilevel disc degeneration and spondylosis. 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 Severe foraminal encroachment bilaterally C4-5 and C5-6. Moderate spinal stenosis and foraminal stenosis bilaterally at C6-7.  Other neck: 12 mm low-density lesion left thyroid gland. No adenopathy in the neck  CTA HEAD  Anterior circulation: Atherosclerotic calcification in the cavernous carotid bilaterally without significant stenosis. Anterior and middle cerebral arteries are patent bilaterally without stenosis or aneurysm.  Posterior circulation: Both vertebral  arteries are patent to the basilar without stenosis. PICA patent bilaterally. Basilar is widely patent. Left AICA patent. Bilateral superior cerebellar and posterior cerebral arteries widely patent. Fetal origin of the right posterior cerebral artery. No aneurysm in the posterior circulation.  Venous sinuses: Patent  Anatomic variants: As above  Delayed phase: Not performed  IMPRESSION: 50% diameter stenosis of the proximal left internal carotid artery. Ulcerated plaque in the left carotid bulb. This could be a source of emboli.  Both vertebral arteries are patent to the basilar without significant stenosis  No significant intracranial stenosis.  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  12 mm nodule left thyroid  Critical Value/emergent results were called by telephone at the time of interpretation on 02/28/2015 at 10:35 Am to Dr. Elspeth ChoPETER SUMNER , who verbally acknowledged these results.   Electronically Signed   By: Marlan Palauharles  Clark M.D.   On: 02/28/2015 11:31   Ct Head Wo Contrast  03/01/2015   CLINICAL DATA:  Stroke.  24 hour  post tPA administration  EXAM: CT HEAD WITHOUT CONTRAST  TECHNIQUE: Contiguous axial images were obtained from the base of the skull through the vertex without intravenous contrast.  COMPARISON:  Limited MRI head 02/28/2015.  CT head 02/28/2015  FINDINGS: Negative for intracranial hemorrhage.  Patchy hypodensity in the periventricular white matter bilaterally is unchanged and most consistent with mild chronic microvascular ischemia. No definite acute infarct or mass. No shift of the midline structures  Calvarium intact.  IMPRESSION: Mild chronic microvascular ischemic changes are stable. No hemorrhage or acute infarct.   Electronically Signed   By: Marlan Palauharles  Clark M.D.   On: 03/01/2015 11:40   Ct Head Wo Contrast  02/28/2015   CLINICAL DATA:  Altered mental status, leaning to the right.   EXAM: CT HEAD WITHOUT CONTRAST  TECHNIQUE: Contiguous axial images were obtained from the base of the skull through the vertex without intravenous contrast.  COMPARISON:  MR brain 06/28/2011 and CT head 06/20/2011.  FINDINGS: No evidence of an acute infarct, acute hemorrhage, mass lesion, mass effect or hydrocephalus. Atrophy. Periventricular low attenuation is mild. Visualized portions of the paranasal sinuses and mastoid air cells are clear.  IMPRESSION: 1.  No acute intracranial abnormality. 2. Atrophy and chronic microvascular white matter ischemic changes.   Electronically Signed   By: Leanna Battles M.D.   On: 02/28/2015 10:42   Ct Angio Neck W/cm &/or Wo/cm  02/28/2015   CLINICAL DATA:  Stroke  EXAM: CT ANGIOGRAPHY HEAD AND NECK  TECHNIQUE: Multidetector CT imaging of the head and neck was performed using the standard protocol during bolus administration of intravenous contrast. Multiplanar CT image reconstructions and MIPs were obtained to evaluate the vascular anatomy. Carotid stenosis measurements (when applicable) are obtained utilizing NASCET criteria, using the distal internal carotid diameter as the denominator.  CONTRAST:  50mL OMNIPAQUE IOHEXOL 350 MG/ML SOLN  COMPARISON:  MRI 06/28/2011.  CT head from earlier today  FINDINGS: CT HEAD  Brain: Negative for acute infarct. Mild chronic microvascular ischemic change in the white matter. Mild atrophy. Negative for hemorrhage.  Calvarium and skull base: Negative  Paranasal sinuses: Negative  Orbits: Negative  CTA NECK  Aortic arch: Mild atherosclerotic disease in the aortic arch and origin of the innominate and left subclavian arteries. Left carotid origin is off the innominate artery. Subclavian arteries are widely patent.  Right carotid system: Scattered atherosclerotic calcification in the common carotid artery. Atherosclerotic calcification in the carotid bulb without significant stenosis.  Left carotid system: Atherosclerotic calcification in the  left common carotid artery narrowing the lumen by approximately 30% diameter stenosis. Atherosclerotic plaque in the carotid bulb which is noncalcified. This plaque is ulcerated. Focal stenosis at the origin of the left internal carotid narrowing the lumen by 50% diameter stenosis.  Vertebral arteries:Both vertebral arteries are patent to the basilar without significant stenosis or dissection.  Skeleton: Advanced multilevel disc degeneration and spondylosis. 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 Severe foraminal encroachment bilaterally C4-5 and C5-6. Moderate spinal stenosis and foraminal stenosis bilaterally at C6-7.  Other neck: 12 mm low-density lesion left thyroid gland. No adenopathy in the neck  CTA HEAD  Anterior circulation: Atherosclerotic calcification in the cavernous carotid bilaterally without significant stenosis. Anterior and middle cerebral arteries are patent bilaterally without stenosis or aneurysm.  Posterior circulation: Both vertebral arteries are patent to the basilar without stenosis. PICA patent bilaterally. Basilar is widely patent. Left AICA patent. Bilateral superior cerebellar and posterior cerebral arteries widely patent. Fetal origin of the right posterior cerebral artery. No aneurysm in the posterior circulation.  Venous sinuses: Patent  Anatomic variants: As above  Delayed phase: Not performed  IMPRESSION: 50% diameter stenosis of the proximal left internal carotid artery. Ulcerated plaque in the left carotid bulb. This could be a source of emboli.  Both vertebral arteries are patent to the basilar without significant stenosis  No significant intracranial stenosis.  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  12 mm nodule left thyroid  Critical Value/emergent results were called by telephone at the time of interpretation on 02/28/2015 at  10:35 Am to Dr. Elspeth Cho , who verbally acknowledged these results.   Electronically Signed   By: Marlan Palau M.D.   On: 02/28/2015 11:31   Mr Brain Wo Contrast  02/28/2015   CLINICAL DATA:  Code stroke.  Altered mental status.  EXAM: MRI HEAD WITHOUT CONTRAST  TECHNIQUE: Multiplanar, multiecho pulse sequences of the brain and surrounding structures were obtained without intravenous contrast.  COMPARISON:  CT head 02/28/2015.  MRI head 06/28/2011  FINDINGS: Axial diffusion imaging only was performed.  No other sequences were performed per neurology.  Mild hyperintense signal is present in the left frontal parietal white matter . Cortex does not appear to be involved. It is difficult determine if this is T2 shine through from chronic ischemia or possibly acute infarct. I would favor this is chronic. Complete MRI of the brain would be helpful for further evaluation.  IMPRESSION: Mild hyperintense signal in the left convexity white matter, favor chronic ischemia. Complete MRI of the brain is suggested for further evaluation  I reviewed the  findings with the neurology PA.   Electronically Signed   By: Marlan Palau M.D.   On: 02/28/2015 12:06   Dg Chest Port 1 View  03/01/2015   CLINICAL DATA:  Shortness of Breath  EXAM: PORTABLE CHEST - 1 VIEW  COMPARISON:  Feb 28, 2015  FINDINGS: Suspect a degree of underlying emphysematous change. There is slight edema in the bases. Lungs elsewhere clear. Heart is mildly enlarged with a mild degree of pulmonary venous hypertension. No adenopathy. There is atherosclerotic change in the aorta. There is mid thoracic dextroscoliosis.  IMPRESSION: Evidence of a degree of congestive heart failure superimposed on emphysematous change. No airspace consolidation.   Electronically Signed   By: Bretta Bang III M.D.   On: 03/01/2015 09:04   Dg Chest Portable 1 View  02/28/2015   CLINICAL DATA:  Code stroke.  Patient nonverbal.  EXAM: PORTABLE CHEST - 1 VIEW  COMPARISON:   01/06/2015, 04/04/2009.  FINDINGS: Cardiac silhouette upper normal in size to slightly enlarged, accentuated by portable technique and the less than optimal inspiration. Suboptimal inspiration also accounts for atelectasis in the lung bases. Lungs otherwise clear. No visible pleural effusions.  IMPRESSION: Suboptimal inspiration accounts for bibasilar atelectasis. No acute cardiopulmonary disease otherwise.   Electronically Signed   By: Hulan Saas M.D.   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 -per neurology team -cont PT/OT  -NEW altered mentation today associated with hypoxia; may need to repat CT head to exclude re-bleed -suspect underlying dementia PTA  Active Problems:   Acute systolic congestive heart failure, NYHA class 3 -new problem; ef 30% on ECHO this admit -not hypoxic -start coreg-consider add ACE I -consult Cards    COPD exacerbation -add Duoneb -no indication for steroids -has wet sounding AM cough     HTN  -adding Coreg -consider add ACE I    Tobacco abuse -counseled on cessation-pt told me she was "grown and could do what she wanted"    Severe protein-calorie malnutrition -weight 96 lbs BMI 15 -pt reports non-volitional loss -Nutrition following; cont protein supplements   Chronic insomnia -cont Melatonin    DVT Prophylaxis: SCDs  Family Communication:   No family at bedside  Code Status:  FULL CODE  Condition: STABLE   Discharge disposition: PER PRIMARY TEAM  Time spent in minutes : 60      Lindley Stachnik L. ANP on 03/02/2015 at 8:35 AM  Between 7am to 7pm - Pager - [phone]  After 7pm go to [email or web address] - password [email or web address]  And look for the night coverage person covering me after hours  [facility]

## 2015-03-02 NOTE — Progress Notes (Signed)
Discussed with RNCM.  Family/pt interested in SNF at d/c.  CSW will begin bed search.  Full assessment to follow.

## 2015-03-03 DIAGNOSIS — E43 Unspecified severe protein-calorie malnutrition: Secondary | ICD-10-CM

## 2015-03-03 DIAGNOSIS — Z720 Tobacco use: Secondary | ICD-10-CM

## 2015-03-03 DIAGNOSIS — I429 Cardiomyopathy, unspecified: Secondary | ICD-10-CM

## 2015-03-03 DIAGNOSIS — I420 Dilated cardiomyopathy: Secondary | ICD-10-CM

## 2015-03-03 DIAGNOSIS — J441 Chronic obstructive pulmonary disease with (acute) exacerbation: Secondary | ICD-10-CM

## 2015-03-03 NOTE — Clinical Social Work Note (Signed)
Clinical Social Work Assessment  Patient Details  Name: Tammy [name] MRN: [id number] Date of Birth: 03-Dec-1940  Date of referral:  03/03/15               Reason for consult:  Facility Placement                Permission sought to share information with:    Permission granted to share information::     Name::        Agency::     Relationship::     Contact Information:     Housing/Transportation Living arrangements for the past 2 months:  Single Family Home Source of Information:  Patient Patient Interpreter Needed:  None Criminal Activity/Legal Involvement Pertinent to Current Situation/Hospitalization:  No - Comment as needed Significant Relationships:  Adult Children Lives with:    Do you feel safe going back to the place where you live?  No Need for family participation in patient care:  No (Coment)  Care giving concerns: Pt needs ST SNF prior to d/c home.  Social Worker assessment / plan: CSW met briefly with pt to discuss d/c planning.  Pt was d/c'ed on 5/27, but it was decided at the last minute that she needed SNF.  Pt is agreeable to new d/c plan and wants to go to [facility].  CSW will continue to follow and facilitate SNF tx as bed becomes available.  Employment status:  Retired Nurse, adult PT Recommendations:  [place] / Referral to community resources:     Patient/Family's Response to care: Agreeeable to ST SNF placement at d/c. Patient/Family's Understanding of and Emotional Response to Diagnosis, Current Treatment, and Prognosis: Pt unhappy about going to SNF and wishes to return home.  Emotional support offered.  Emotional Assessment Appearance:  Appears stated age Attitude/Demeanor/Rapport:  Guarded Affect (typically observed):  Defensive Orientation:  Oriented to Self, Oriented to Place, Oriented to  Time, Oriented to Situation Alcohol / Substance use:  Not Applicable Psych involvement (Current and /or  in the community):  No (Comment)  Discharge Needs  Concerns to be addressed:    Readmission within the last 30 days:  No Current discharge risk:  None Barriers to Discharge:  No Barriers Identified   Nour Scalise, Miachel Roux, LCSW 03/03/2015, 4:19 PM

## 2015-03-03 NOTE — Clinical Social Work Placement (Signed)
   CLINICAL SOCIAL WORK PLACEMENT  NOTE  Date:  03/03/2015  Patient Details  Name: Tammy [name] MRN: [id number] Date of Birth: 1941/09/22  Clinical Social Work is seeking post-discharge placement for this patient at the Skilled  Nursing Facility level of care (*CSW will initial, date and re-position this form in  chart as items are completed):  Yes   Patient/family provided with [facility] Clinical Social Work Department's list of facilities offering this level of care within the geographic area requested by the patient (or if unable, by the patient's family).  Yes   Patient/family informed of their freedom to choose among providers that offer the needed level of care, that participate in Medicare, Medicaid or managed care program needed by the patient, have an available bed and are willing to accept the patient.  Yes   Patient/family informed of [facility] interest [facility] and [facility], as well as of the fact that they are under no obligation to receive care at these facilities.  PASRR submitted to EDS on 03/03/15     PASRR number received on 03/03/15     Existing PASRR number confirmed on       FL2 transmitted to all facilities in geographic area requested by pt/family on 03/03/15     FL2 transmitted to all facilities within larger geographic area on       Patient informed that his/her managed care company has contracts with or will negotiate with certain facilities, including the following:            Patient/family informed of bed offers received.  Patient chooses bed at       Physician recommends and patient chooses bed at      Patient to be transferred to   on  .  Patient to be transferred to facility by       Patient family notified on   of transfer.  Name of family member notified:        PHYSICIAN       Additional Comment:    _______________________________________________ Linna CapriceRAKE, Louay Myrie M, LCSW 03/03/2015, 4:19 PM

## 2015-03-03 NOTE — Progress Notes (Signed)
    Consulting cardiologist: Dr. Arvilla Meresaniel Bensimhon  Seen for followup: Cardiomyopathy  Subjective:    Resting in bed, awake and answers questions. No chest pain or dyspnea at rest.  Objective:   Temp:  [97.3 F (36.3 C)-98.6 F (37 C)] 98.3 F (36.8 C) (05/28 1016) Pulse Rate:  [78-97] 82 (05/28 1016) Resp:  [18-22] 18 (05/28 1016) BP: (101-132)/(42-76) 126/76 mmHg (05/28 1016) SpO2:  [96 %-100 %] 98 % (05/28 1016) Last BM Date: 03/02/15  Filed Weights   02/28/15 1300 03/02/15 0859  Weight: 96 lb 9 oz (43.8 kg) 99 lb 1.6 oz (44.951 kg)   No intake or output data in the 24 hours ending 03/03/15 1035  Telemetry: Sinus rhythm.  Exam:  General: Thin elderly woman in no distress.  Lungs: Clear, nonlabored.  Cardiac: Elevated JVP, RRR without gallop.  Extremities: No pitting edema.  Lab Results:  Basic Metabolic Panel:  Recent Labs Lab 02/28/15 1017 02/28/15 1024  NA 134* 135  K 3.9 3.8  CL 102 101  CO2 21*  --   GLUCOSE 130* 134*  BUN 12 12  CREATININE 1.11* 0.90  CALCIUM 8.8*  --     CBC:  Recent Labs Lab 02/28/15 1017 02/28/15 1024  WBC 4.9  --   HGB 11.7* 12.9  HCT 35.5* 38.0  MCV 91.7  --   PLT 175  --     Cardiac Enzymes:  Recent Labs Lab 03/01/15 1057 03/01/15 1559  TROPONINI 0.06* 0.06*    Coagulation:  Recent Labs Lab 02/28/15 1017  INR 1.21    Echocardiogram 02/28/2015: Study Conclusions  - Left ventricle: The cavity size was mildly dilated. Wall thickness was increased in a pattern of moderate LVH. The estimated ejection fraction was 30%. Diffuse hypokinesis. - Mitral valve: There was moderate to severe regurgitation. - Left atrium: The atrium was moderately dilated. - Right ventricle: The cavity size was mildly dilated. Systolic function was moderately reduced. - Impressions: No cardiac source of embolism was identified, but cannot be ruled out on the basis of this examination.  Impressions:  - No  cardiac source of embolism was identified, but cannot be ruled out on the basis of this examination.   Medications:   Scheduled Medications: . aspirin  325 mg Oral Daily  . carvedilol  3.125 mg Oral BID WC  . feeding supplement (RESOURCE BREEZE)  1 Container Oral BID BM  . lisinopril  2.5 mg Oral Daily  . Melatonin  3 mg Oral QHS  . pantoprazole  40 mg Oral Daily      PRN Medications:  acetaminophen **OR** [DISCONTINUED] acetaminophen, albuterol, senna-docusate   Assessment:   1. 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 Diffuse hypokinesis raises possibility of nonischemic etiology, but certainly does not exclude associated CAD. Plan is medical therapy for now.  2. Moderate to severe mitral regurgitation.  3. Left ACA infarct s/p TPA, embolic vs. large vessel atherosclerosis on CT.  4. Mild dementia.  5. COPD with tobacco abuse.  6. Hypertension, blood pressure controlled.  7. Noncompliance.   Plan/Discussion:    Discussed with patient - plan to continue medical therapy at this point with eventual reassessment of LVEF by echocardiogram in one month. Not pursuing TEE or loop recorder since has been deemed poor candidate for anticoagulation. Continue ASA, Coreg, and Lisinopril. Suggest add statin.   Jonelle SidleSamuel G. McDowell, M.D., F.A.C.C.

## 2015-03-03 NOTE — Consult Note (Signed)
[facility] PROGRESS NOTE  [name] ZOX:[id number] DOB: 01-01-1941 DOA: 02/28/2015 PCP: Cala Bradford, MD  Assessment/Plan: 74 y/o female admitted 5/25 with stroke sx's superimposed on known progressive congnitive decline. Patient is found to have Dominant left ACA infarct s/p TPA, embolic vs. large vessel atherosclerosis on CT (pt refused MRI).  -Pt noncomplianrt with therapies and medical direction and has significant issues with memory so deemed not candidate for anticoagulation. ECHO this admit NEW EF 30% with sx's of CHF on 5/26 that responded to 1x dose IV Lasix. TRH have been asked to assume medical care.  1. Acute CVA. Dominant left ACA infarct s/p TPA, embolic vs. large vessel atherosclerosis. Patient refused MRI -cont management per neurology team. EEG: neg.  Echo: LVEF 30%. LDL-61. ha1c-5.9. Patient not compliant with medical direction, not a candidate for anticoagulation, no further embolic workup needed per neurology  -symptoms ->resolved. PT recommended SNF-pend  2. Acute systolic congestive heart failure, NYHA class 3. LVEF 30% on ECHO this admit -clinically euvolemic. 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 Recommended to cont coreg, ACE, ASA, statin. And repeat echo in 4 weeks 3. COPD. Chronic tobacco use. No wheezing on exam. add Duoneb. no indication for steroids. counseled dto stop smoking  4. HTN . adding Coreg, ACE 5. Tobacco abuse -counseled on cessation-pt told me she was "grown and could do what she wanted" 6. Severe protein-calorie malnutrition. weight 96 lbs BMI 15. Nutrition following; cont protein supplements   Code Status: full Family Communication: d/w patient  (indicate person spoken with, relationship, and if by phone, the number) Disposition Plan: SNF-pend    Consultants:  Neurology   Procedures:  echoStudy Conclusions  - Left ventricle: The cavity size was mildly dilated. Wall thickness was increased in a pattern of moderate LVH.  The estimated ejection fraction was 30%. Diffuse hypokinesis. - Mitral valve: There was moderate to severe regurgitation. - Left atrium: The atrium was moderately dilated. - Right ventricle: The cavity size was mildly dilated. Systolic function was moderately reduced. - Impressions: No cardiac source of embolism was identified, but cannot be ruled out on the basis of this examination.  Impressions:  - No cardiac source of embolism was identified, but cannot be ruled out on the basis of this examination.   Antibiotics:  none (indicate start date, and stop date if known)  HPI/Subjective: alert  Objective: Filed Vitals:   03/03/15 0515  BP: 112/42  Pulse: 78  Temp: 97.3 F (36.3 C)  Resp: 18   No intake or output data in the 24 hours ending 03/03/15 0912 Filed Weights   02/28/15 1300 03/02/15 0859  Weight: 43.8 kg (96 lb 9 oz) 44.951 kg (99 lb 1.6 oz)    Exam:   General:  Alert, oriented   Cardiovascular: s1,s2 rrr  Respiratory: CTA BL   Abdomen: soft, nt,nd   Musculoskeletal: no leg edema   Data Reviewed: Basic Metabolic Panel:  Recent Labs Lab 02/28/15 1017 02/28/15 1024  NA 134* 135  K 3.9 3.8  CL 102 101  CO2 21*  --   GLUCOSE 130* 134*  BUN 12 12  CREATININE 1.11* 0.90  CALCIUM 8.8*  --    Liver Function Tests:  Recent Labs Lab 02/28/15 1017  AST 28  ALT 18  ALKPHOS 48  BILITOT 0.5  PROT 6.4*  ALBUMIN 3.2*   No results for input(s): LIPASE, AMYLASE in the last 168 hours. No results for input(s): AMMONIA in the last 168 hours. CBC:  Recent Labs Lab 02/28/15  1017 02/28/15 1024  WBC 4.9  --   NEUTROABS 3.4  --   HGB 11.7* 12.9  HCT 35.5* 38.0  MCV 91.7  --   PLT 175  --    Cardiac Enzymes:  Recent Labs Lab 03/01/15 1057 03/01/15 1559  TROPONINI 0.06* 0.06*   BNP (last 3 results) No results for input(s): BNP in the last 8760 hours.  ProBNP (last 3 results) No results for input(s): PROBNP in the last 8760  hours.  CBG: No results for input(s): GLUCAP in the last 168 hours.  Recent Results (from the past 240 hour(s))  MRSA PCR Screening     Status: None   Collection Time: 02/28/15  6:49 PM  Result Value Ref Range Status   MRSA by PCR NEGATIVE NEGATIVE Final    Comment:        The GeneXpert MRSA Assay (FDA approved for NASAL specimens only), is one component of a comprehensive MRSA colonization surveillance program. It is not intended to diagnose MRSA infection nor to guide or monitor treatment for MRSA infections.      Studies: Dg Chest 1 View  03/02/2015   CLINICAL DATA:  Followup chest x-ray for wheezing.  EXAM: CHEST  1 VIEW  COMPARISON:  03/01/2015  FINDINGS: Lungs are adequately inflated without consolidation or effusion. There is evidence of a few small sub cm bilateral nodule opacities some which may represent EKG leads. There is mild stable cardiomegaly. Calcified plaque is present over the aortic arch. There are degenerative changes of the spine.  IMPRESSION: No acute cardiopulmonary disease.  Mild stable cardiomegaly.  Few small sub cm bilateral pulmonary nodules some of which may represent external EKG leads. Recommend followup chest x-ray 4 weeks.   Electronically Signed   By: Elberta Fortisaniel  Boyle M.D.   On: 03/02/2015 11:12   Ct Head Wo Contrast  03/01/2015   CLINICAL DATA:  Stroke.  24 hour  post tPA administration  EXAM: CT HEAD WITHOUT CONTRAST  TECHNIQUE: Contiguous axial images were obtained from the base of the skull through the vertex without intravenous contrast.  COMPARISON:  Limited MRI head 02/28/2015.  CT head 02/28/2015  FINDINGS: Negative for intracranial hemorrhage.  Patchy hypodensity in the periventricular white matter bilaterally is unchanged and most consistent with mild chronic microvascular ischemia. No definite acute infarct or mass. No shift of the midline structures  Calvarium intact.  IMPRESSION: Mild chronic microvascular ischemic changes are stable. No  hemorrhage or acute infarct.   Electronically Signed   By: Marlan Palauharles  Clark M.D.   On: 03/01/2015 11:40    Scheduled Meds: . aspirin  325 mg Oral Daily  . carvedilol  3.125 mg Oral BID WC  . feeding supplement (RESOURCE BREEZE)  1 Container Oral BID BM  . lisinopril  2.5 mg Oral Daily  . Melatonin  3 mg Oral QHS  . pantoprazole  40 mg Oral Daily   Continuous Infusions:   Principal Problem:   Stroke Active Problems:   CVA (cerebral infarction)   Acute systolic congestive heart failure, NYHA class 3   COPD exacerbation   Tobacco abuse   Severe protein-calorie malnutrition   HTN (hypertension)   Congestive dilated cardiomyopathy    Time spent: >35 minutes     Esperanza SheetsBURIEV, Nthony Lefferts N  [facility] Pager [phone]. If 7PM-7AM, please contact night-coverage at [email or web address], password [facility] 03/03/2015, 9:12 AM  LOS: 3 days

## 2015-03-03 NOTE — Progress Notes (Signed)
STROKE TEAM PROGRESS NOTE   SUBJECTIVE (INTERVAL HISTORY) Her son and daughter in law are at the bedside.  Overall she feels her condition is resolved. She continues to refuse MRI.  Pt able to ambulate without assistance.    OBJECTIVE Temp:  [97.3 F (36.3 C)-98.6 F (37 C)] 98.3 F (36.8 C) (05/28 1016) Pulse Rate:  [78-97] 82 (05/28 1016) Cardiac Rhythm:  [-] Normal sinus rhythm (05/27 2000) Resp:  [18-22] 18 (05/28 1016) BP: (101-132)/(42-76) 126/76 mmHg (05/28 1016) SpO2:  [96 %-100 %] 98 % (05/28 1016)  No results for input(s): GLUCAP in the last 168 hours.  Recent Labs Lab 02/28/15 1017 02/28/15 1024  NA 134* 135  K 3.9 3.8  CL 102 101  CO2 21*  --   GLUCOSE 130* 134*  BUN 12 12  CREATININE 1.11* 0.90  CALCIUM 8.8*  --     Recent Labs Lab 02/28/15 1017  AST 28  ALT 18  ALKPHOS 48  BILITOT 0.5  PROT 6.4*  ALBUMIN 3.2*    Recent Labs Lab 02/28/15 1017 02/28/15 1024  WBC 4.9  --   NEUTROABS 3.4  --   HGB 11.7* 12.9  HCT 35.5* 38.0  MCV 91.7  --   PLT 175  --     Recent Labs Lab 03/01/15 1057 03/01/15 1559  TROPONINI 0.06* 0.06*   No results for input(s): LABPROT, INR in the last 72 hours.  Recent Labs  03/02/15 1235  COLORURINE YELLOW  LABSPEC 1.007  PHURINE 6.0  GLUCOSEU NEGATIVE  HGBUR NEGATIVE  BILIRUBINUR NEGATIVE  KETONESUR NEGATIVE  PROTEINUR NEGATIVE  UROBILINOGEN 1.0  NITRITE NEGATIVE  LEUKOCYTESUR SMALL*       Component Value Date/Time   CHOL 120 03/01/2015 0218   TRIG 60 03/01/2015 0218   HDL 47 03/01/2015 0218   CHOLHDL 2.6 03/01/2015 0218   VLDL 12 03/01/2015 0218   LDLCALC 61 03/01/2015 0218   Lab Results  Component Value Date   HGBA1C 5.9* 03/01/2015      Component Value Date/Time   LABOPIA NONE DETECTED 03/02/2015 1235   COCAINSCRNUR NONE DETECTED 03/02/2015 1235   LABBENZ NONE DETECTED 03/02/2015 1235   AMPHETMU NONE DETECTED 03/02/2015 1235   THCU NONE DETECTED 03/02/2015 1235   LABBARB NONE  DETECTED 03/02/2015 1235     Recent Labs Lab 02/28/15 1017  ETH <5    I have personally reviewed the radiological images below and agree with the radiology interpretations.  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 Ulcerated plaque in the left carotid bulb. This could be a source of emboli.  Both vertebral arteries are patent to the basilar without significant stenosis  No significant intracranial stenosis.  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  12 mm nodule left thyroid     Ct Head Wo Contrast  03/01/2015   IMPRESSION: Mild chronic microvascular ischemic changes are stable. No hemorrhage or acute infarct.    02/28/2015   IMPRESSION: 1. No acute intracranial abnormality. 2. Atrophy and chronic microvascular white matter ischemic changes.   Mr Brain Wo Contrast  02/28/2015  IMPRESSION: Mild hyperintense signal in the left convexity white matter, favor chronic ischemia. Complete MRI of the brain is suggested for further evaluation     [facility]  03/01/2015   IMPRESSION: Evidence of a  degree of congestive heart failure superimposed on emphysematous change. No airspace consolidation.    02/28/2015    IMPRESSION: Suboptimal inspiration accounts for bibasilar atelectasis. No acute cardiopulmonary disease otherwise.   2D Echocardiogram  - Left ventricle: The cavity size was mildly dilated. Wall thickness was increased in a pattern of moderate LVH. The estimated ejection fraction was 30%. Diffuse hypokinesis. - Mitral valve: There was moderate to severe regurgitation. - Left atrium: The atrium was moderately dilated. - Right ventricle: The cavity size was mildly dilated. Systolic function was moderately reduced. - Impressions: No cardiac source of embolism was identified,  but cannot be ruled out on the basis of this examination. Impressions: - No cardiac source of embolism was identified, but cannot be ruled out on the basis of this examination.  EEG - Normal awake electroencephalogram. There are no focal lateralizing or epileptiform features.  EKG  sinus tachycardia. For complete results please see formal report.  PHYSICAL EXAM  Temp:  [97.3 F (36.3 C)-98.6 F (37 C)] 98.3 F (36.8 C) (05/28 1016) Pulse Rate:  [78-97] 82 (05/28 1016) Resp:  [18-22] 18 (05/28 1016) BP: (101-132)/(42-76) 126/76 mmHg (05/28 1016) SpO2:  [96 %-100 %] 98 % (05/28 1016)  General - Well nourished, well developed, in mild respiratory distress.  Ophthalmologic - fundi not visualized due to noncooperation.  Cardiovascular - Regular rhythm, tachycardia.  Neck - supple, no carotid bruits  Mental Status -  Level of arousal and orientation to time, place, and person were intact. Language including expression, naming, repetition, comprehension was assessed and found intact. Fund of Knowledge was assessed and was intact.  Cranial Nerves II - XII - II - Visual field intact OU. III, IV, VI - Extraocular movements intact. V - Facial sensation intact bilaterally. VII - Facial movement intact bilaterally. VIII - Hearing & vestibular intact bilaterally. X - Palate elevates symmetrically. XI - Chin turning & shoulder shrug intact bilaterally. XII - Tongue protrusion intact.  Motor Strength - The patient's strength was normal in all extremities and pronator drift was absent.  Bulk was normal and fasciculations were absent.   Motor Tone - Muscle tone was assessed at the neck and appendages and was normal.  Reflexes - The patient's reflexes were symmetrical in all extremities and she had no pathological reflexes.  Sensory - Light touch, temperature/pinprick were assessed and were symmetrical.    Coordination - The patient had normal movements in the hands and feet with  no ataxia or dysmetria.  Tremor was absent.  Gait and Station - The patient's transfers, posture, gait, station, and turns were observed as normal.   ASSESSMENT/PLAN [name] is a 74 y.o. female with history of current smoker admitted for altered mental status and right leg weakness. Limited MRI suspicious for left ACA stroke. Patient received TPA. Symptoms resolved.    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 large vessel atherosclerosis   MRI  pending complete study. Limited study concerning for left ACA stroke   CTA head and neck showed left ICA 50% stenosis   2D Echo  EF 30%  EEG negative for seizure  LDL 61   HgbA1c pending  SCDs  for VTE prophylaxis  Patient not compliant with medical direction, not a candidate for anticoagulation, no further embolic workup needed  Pt is eating diet that is brought in by her son.    no antithrombotic prior to admission, now on aspirin 325 mg orally every day  Patient counseled to  be compliant with her antithrombotic medications  Ongoing aggressive stroke risk factor management  Therapy recommendations:  SNF   Disposition:  SNF  Congestive heart failure  Wheezing during round  CXR showed evidence of CHF  Lasix 40 mg once  Patient improved after Lasix  Continue telemetry monitoring  Cardiology consulted and recommend to repeat 2D echo in 4 weeks and if still low EF will consider cardiac cath. However, doubt pt will follow up.   Hypertension  Home meds:   None BP goal normotnesive Currently on coreg and lisinopril  Stable  Patient counseled to be compliant with her blood pressure medications  Tobacco abuse  Current smoker  Smoking cessation counseling provided  Pt is not ready to quit yet  Severe malnutrition  Due to chronic illness  Dietitian on board  Nutrition supplement  Other Stroke Risk Factors  Advanced age  Other Active Problems  Modification  Not compliant  with medical direction  Other Pertinent History    Hospital day # 3  S/p discussion with son today. He would want the pt to be transferred to [facility].  Social work is seeing the pt.  Pt does appear to be impulsive and agitated.      Pauletta Browns  Stroke Neurology 03/03/2015 12:02 PM    To contact Stroke Continuity provider, please refer to [email or web address]. After hours, contact General Neurology

## 2015-03-03 NOTE — Progress Notes (Signed)
Pt refusing assessments and bed alarm. Pt refuses to call staff when ambulating and remains uncooperative with staff. Staff advised pt of fall risk. Will continue to monitor. Gara KronerHayles, Jonavon Trieu M, RN

## 2015-03-03 NOTE — Clinical Social Work Placement (Signed)
   CLINICAL SOCIAL WORK PLACEMENT  NOTE  Date:  03/03/2015  Patient Details  Name: Tammy [name] MRN: [id number] Date of Birth: Jul 23, 1941  Clinical Social Work is seeking post-discharge placement for this patient at the Skilled  Nursing Facility level of care (*CSW will initial, date and re-position this form in  chart as items are completed):  Yes   Patient/family provided with [place] Clinical Social Work Department's list of facilities offering this level of care within the geographic area requested by the patient (or if unable, by the patient's family).  Yes   Patient/family informed of their freedom to choose among providers that offer the needed level of care, that participate in Medicare, Medicaid or managed care program needed by the patient, have an available bed and are willing to accept the patient.  Yes   Patient/family informed of '[facility] interest [facility] and [facility], as well as of the fact that they are under no obligation to receive care at these facilities.  PASRR submitted to EDS on 03/03/15     PASRR number received on 03/03/15     Existing PASRR number confirmed on       FL2 transmitted to all facilities in geographic area requested by pt/family on 03/03/15     FL2 transmitted to all facilities within larger geographic area on       Patient informed that his/her managed care company has contracts with or will negotiate with certain facilities, including the following:            Patient/family informed of bed offers received.  Patient chooses bed at       Physician recommends and patient chooses bed at      Patient to be transferred to   on  .  Patient to be transferred to facility by       Patient family notified on   of transfer.  Name of family member notified:        PHYSICIAN       Additional Comment:    _______________________________________________ Linna CapriceRAKE, Courteney Alderete M, LCSW 03/03/2015, 4:15 PM

## 2015-03-04 DIAGNOSIS — I429 Cardiomyopathy, unspecified: Secondary | ICD-10-CM | POA: Insufficient documentation

## 2015-03-04 DIAGNOSIS — I639 Cerebral infarction, unspecified: Secondary | ICD-10-CM

## 2015-03-04 DIAGNOSIS — I10 Essential (primary) hypertension: Secondary | ICD-10-CM

## 2015-03-04 MED ORDER — PANTOPRAZOLE SODIUM 40 MG PO TBEC: 40.0000 mg | DELAYED_RELEASE_TABLET | Freq: Every day | ORAL | Status: DC

## 2015-03-04 MED ORDER — CARVEDILOL 3.125 MG PO TABS: 3.1250 mg | ORAL_TABLET | Freq: Two times a day (BID) | ORAL | Status: AC

## 2015-03-04 MED ORDER — SENNOSIDES-DOCUSATE SODIUM 8.6-50 MG PO TABS: 1.0000 | ORAL_TABLET | Freq: Every evening | ORAL | Status: DC | PRN

## 2015-03-04 MED ORDER — BOOST / RESOURCE BREEZE PO LIQD: 1.0000 | Freq: Two times a day (BID) | ORAL | Status: DC

## 2015-03-04 MED ORDER — ALBUTEROL SULFATE (2.5 MG/3ML) 0.083% IN NEBU: 2.5000 mg | INHALATION_SOLUTION | RESPIRATORY_TRACT | Status: DC | PRN

## 2015-03-04 MED ORDER — LISINOPRIL 2.5 MG PO TABS: 2.5000 mg | ORAL_TABLET | Freq: Every day | ORAL | Status: DC

## 2015-03-04 MED ORDER — ASPIRIN 325 MG PO TABS
325.0000 mg | ORAL_TABLET | Freq: Every day | ORAL | Status: DC
Start: 2015-03-04 — End: 2015-08-19

## 2015-03-04 MED ORDER — ACETAMINOPHEN 325 MG PO TABS: 650.0000 mg | ORAL_TABLET | Freq: Four times a day (QID) | ORAL | Status: DC | PRN

## 2015-03-04 NOTE — Progress Notes (Signed)
    Consulting cardiologist: Dr. Arvilla Meresaniel Bensimhon  Seen for followup: Cardiomyopathy  Subjective:    No chest pain or dyspnea at rest.  Objective:   Temp:  [98.2 F (36.8 C)-99.1 F (37.3 C)] 98.2 F (36.8 C) (05/28 2248) Pulse Rate:  [77-82] 78 (05/29 1023) Resp:  [18-20] 18 (05/29 1023) BP: (101-126)/(41-68) 126/59 mmHg (05/29 1023) SpO2:  [99 %-100 %] 99 % (05/29 1023) Last BM Date: 03/03/15  Filed Weights   02/28/15 1300 03/02/15 0859  Weight: 96 lb 9 oz (43.8 kg) 99 lb 1.6 oz (44.951 kg)   No intake or output data in the 24 hours ending 03/04/15 1058  Telemetry: Sinus rhythm.  Exam:  General: Thin elderly woman in no distress.  Lungs: Clear, nonlabored.  Cardiac: Elevated JVP, RRR without gallop.  Extremities: No pitting edema.  Lab Results:  Basic Metabolic Panel:  Recent Labs Lab 02/28/15 1017 02/28/15 1024  NA 134* 135  K 3.9 3.8  CL 102 101  CO2 21*  --   GLUCOSE 130* 134*  BUN 12 12  CREATININE 1.11* 0.90  CALCIUM 8.8*  --     CBC:  Recent Labs Lab 02/28/15 1017 02/28/15 1024  WBC 4.9  --   HGB 11.7* 12.9  HCT 35.5* 38.0  MCV 91.7  --   PLT 175  --     Cardiac Enzymes:  Recent Labs Lab 03/01/15 1057 03/01/15 1559  TROPONINI 0.06* 0.06*    Coagulation:  Recent Labs Lab 02/28/15 1017  INR 1.21    Echocardiogram 02/28/2015: Study Conclusions  - Left ventricle: The cavity size was mildly dilated. Wall thickness was increased in a pattern of moderate LVH. The estimated ejection fraction was 30%. Diffuse hypokinesis. - Mitral valve: There was moderate to severe regurgitation. - Left atrium: The atrium was moderately dilated. - Right ventricle: The cavity size was mildly dilated. Systolic function was moderately reduced. - Impressions: No cardiac source of embolism was identified, but cannot be ruled out on the basis of this examination.  Impressions:  - No cardiac source of embolism was identified, but  cannot be ruled out on the basis of this examination.   Medications:   Scheduled Medications: . aspirin  325 mg Oral Daily  . carvedilol  3.125 mg Oral BID WC  . feeding supplement (RESOURCE BREEZE)  1 Container Oral BID BM  . lisinopril  2.5 mg Oral Daily  . Melatonin  3 mg Oral QHS  . pantoprazole  40 mg Oral Daily     PRN Medications: acetaminophen **OR** [DISCONTINUED] acetaminophen, albuterol, senna-docusate   Assessment:   1. 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 Diffuse hypokinesis raises possibility of nonischemic etiology, but certainly does not exclude associated CAD. Plan is medical therapy for now.  2. Moderate to severe mitral regurgitation.  3. Left ACA infarct s/p TPA, embolic vs. large vessel atherosclerosis on CT.  4. Mild dementia.  5. COPD with tobacco abuse.  6. Hypertension, blood pressure controlled.  7. Noncompliance.   Plan/Discussion:    Continue medical therapy at this point with eventual reassessment of LVEF by echocardiogram in one month. Not pursuing TEE or loop recorder since has been deemed poor candidate for anticoagulation. Continue ASA, Coreg, and Lisinopril. Suggest add statin as well. Pending discharge to SNF. She will need to follow-up with the heart failure clinic for echocardiogram in approximately one month.   Jonelle SidleSamuel G. Elaisha Zahniser, M.D., F.A.C.C.

## 2015-03-04 NOTE — Consult Note (Signed)
[facility] PROGRESS NOTE  [name] ZOX:[id number] DOB: 05-12-1941 DOA: 02/28/2015 PCP: Cala Bradford, MD  Assessment/Plan: 74 y/o female admitted 5/25 with stroke sx's superimposed on known progressive congnitive decline. Patient is found to have Dominant left ACA infarct s/p TPA, embolic vs. large vessel atherosclerosis on CT (pt refused MRI).  -Pt noncomplianrt with therapies and medical direction and has significant issues with memory so deemed not candidate for anticoagulation. ECHO this admit NEW EF 30% with sx's of CHF on 5/26 that responded to 1x dose IV Lasix. TRH have been asked to assume medical care.  1. Acute CVA. Dominant left ACA infarct s/p TPA, embolic vs. large vessel atherosclerosis. Patient refused MRI -EEG: neg.  Echo: LVEF 30%. LDL-61. ha1c-5.9. Patient not compliant with medical direction, not a candidate for anticoagulation, no further embolic workup needed per neurology  -symptoms ->resolved. PT recommended SNF-pend  2. Acute systolic congestive heart failure, NYHA class 3. LVEF 30% on ECHO this admit -clinically euvolemic. 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 Recommended to cont coreg, ACE, ASA, statin. And repeat echo in 4 weeks 3. COPD. Chronic tobacco use. No wheezing on exam. add Duoneb. no indication for steroids. counseled dto stop smoking  4. HTN . adding Coreg, ACE 5. Tobacco abuse -counseled on cessation-pt told me she was "grown and could do what she wanted" 6. Severe protein-calorie malnutrition. weight 96 lbs BMI 15. Nutrition following; cont protein supplements  D/w patient, her son at the bedside. All questions answered.  -d/c plans: awaiting SNF   Code Status: full Family Communication: d/w patient  (indicate person spoken with, relationship, and if by phone, the number) Disposition Plan: SNF-pend    Consultants:  Neurology   Procedures:  echoStudy Conclusions  - Left ventricle: The cavity size was mildly dilated.  Wall thickness was increased in a pattern of moderate LVH. The estimated ejection fraction was 30%. Diffuse hypokinesis. - Mitral valve: There was moderate to severe regurgitation. - Left atrium: The atrium was moderately dilated. - Right ventricle: The cavity size was mildly dilated. Systolic function was moderately reduced. - Impressions: No cardiac source of embolism was identified, but cannot be ruled out on the basis of this examination.  Impressions:  - No cardiac source of embolism was identified, but cannot be ruled out on the basis of this examination.   Antibiotics:  none (indicate start date, and stop date if known)  HPI/Subjective: alert  Objective: Filed Vitals:   03/03/15 2248  BP: 107/41  Pulse: 77  Temp: 98.2 F (36.8 C)  Resp: 18   No intake or output data in the 24 hours ending 03/04/15 0811 Filed Weights   02/28/15 1300 03/02/15 0859  Weight: 43.8 kg (96 lb 9 oz) 44.951 kg (99 lb 1.6 oz)    Exam:   General:  Alert, oriented   Cardiovascular: s1,s2 rrr  Respiratory: CTA BL   Abdomen: soft, nt,nd   Musculoskeletal: no leg edema   Data Reviewed: Basic Metabolic Panel:  Recent Labs Lab 02/28/15 1017 02/28/15 1024  NA 134* 135  K 3.9 3.8  CL 102 101  CO2 21*  --   GLUCOSE 130* 134*  BUN 12 12  CREATININE 1.11* 0.90  CALCIUM 8.8*  --    Liver Function Tests:  Recent Labs Lab 02/28/15 1017  AST 28  ALT 18  ALKPHOS 48  BILITOT 0.5  PROT 6.4*  ALBUMIN 3.2*   No results for input(s): LIPASE, AMYLASE in the last 168 hours. No results for input(s): AMMONIA  in the last 168 hours. CBC:  Recent Labs Lab 02/28/15 1017 02/28/15 1024  WBC 4.9  --   NEUTROABS 3.4  --   HGB 11.7* 12.9  HCT 35.5* 38.0  MCV 91.7  --   PLT 175  --    Cardiac Enzymes:  Recent Labs Lab 03/01/15 1057 03/01/15 1559  TROPONINI 0.06* 0.06*   BNP (last 3 results) No results for input(s): BNP in the last 8760 hours.  ProBNP (last 3  results) No results for input(s): PROBNP in the last 8760 hours.  CBG: No results for input(s): GLUCAP in the last 168 hours.  Recent Results (from the past 240 hour(s))  MRSA PCR Screening     Status: None   Collection Time: 02/28/15  6:49 PM  Result Value Ref Range Status   MRSA by PCR NEGATIVE NEGATIVE Final    Comment:        The GeneXpert MRSA Assay (FDA approved for NASAL specimens only), is one component of a comprehensive MRSA colonization surveillance program. It is not intended to diagnose MRSA infection nor to guide or monitor treatment for MRSA infections.      Studies: Dg Chest 1 View  03/02/2015   CLINICAL DATA:  Followup chest x-ray for wheezing.  EXAM: CHEST  1 VIEW  COMPARISON:  03/01/2015  FINDINGS: Lungs are adequately inflated without consolidation or effusion. There is evidence of a few small sub cm bilateral nodule opacities some which may represent EKG leads. There is mild stable cardiomegaly. Calcified plaque is present over the aortic arch. There are degenerative changes of the spine.  IMPRESSION: No acute cardiopulmonary disease.  Mild stable cardiomegaly.  Few small sub cm bilateral pulmonary nodules some of which may represent external EKG leads. Recommend followup chest x-ray 4 weeks.   Electronically Signed   By: Elberta Fortisaniel  Boyle M.D.   On: 03/02/2015 11:12    Scheduled Meds: . aspirin  325 mg Oral Daily  . carvedilol  3.125 mg Oral BID WC  . feeding supplement (RESOURCE BREEZE)  1 Container Oral BID BM  . lisinopril  2.5 mg Oral Daily  . Melatonin  3 mg Oral QHS  . pantoprazole  40 mg Oral Daily   Continuous Infusions:   Principal Problem:   Stroke Active Problems:   CVA (cerebral infarction)   Acute systolic congestive heart failure, NYHA class 3   COPD exacerbation   Tobacco abuse   Severe protein-calorie malnutrition   HTN (hypertension)   Congestive dilated cardiomyopathy    Time spent: >35 minutes     Esperanza SheetsBURIEV, Kanani Mowbray N  [facility] Pager [phone]. If 7PM-7AM, please contact night-coverage at [email or web address], password [email or web address] 03/04/2015, 8:11 AM  LOS: 4 days

## 2015-03-04 NOTE — Progress Notes (Signed)
[facility]  Report called and given to RN.  DC'd instructions  and prescriptions given to patient.  Vital signs and assessments were stable.

## 2015-03-04 NOTE — Discharge Instructions (Signed)
1) Follow-up with Dr. Roda ShuttersXu at the stroke clinic in 2 months. 2) Follow-up with cardiology for a repeat echocardiogram in 4 weeks. 3) Follow-up in the congestive heart failure clinic with cardiology in 4 weeks 4) Follow-up with primary care physician Dr. Laurann Montanaynthia White in 2 weeks.  5) Dr. Cliffton AstersWhite to arrange for follow-up of thyroid nodule. 6) Repeat chest x-ray in 4 weeks to follow-up pulmonary nodules. Dr Cliffton AstersWhite can arrange. 7) Quit smoking. 8) Low salt diet recommended.

## 2015-03-04 NOTE — Discharge Summary (Signed)
Stroke Discharge Summary  Patient ID: Tammy [name]   MRN: [id number]      DOB: [birth year]  Date of Admission: 02/28/2015 Date of Discharge: 03/04/2015  Attending Physician:  Marvel Plan, MD, Stroke MD  Consulting Physician(s):     cardiology, neurology and Internal medicine Dr. Gala Romney, Dr Hosie Poisson, and Dr Norris Cross Patient's PCP:  Cala Bradford, MD  DISCHARGE DIAGNOSIS: Left ACA infarct treated with TPA Principal Problem:   Stroke Active Problems:   CVA (cerebral infarction)   Acute systolic congestive heart failure, NYHA class 3   COPD exacerbation   Tobacco abuse   Severe protein-calorie malnutrition   HTN (hypertension)   Congestive dilated cardiomyopathy   Secondary cardiomyopathy   Thyroid nodule noted on CTA of head and neck   Severe spinal stenosis noted on CTA of head and neck   Pulmonary nodules noted on chest x-ray with follow-up chest x-ray recommended in 4 weeks for further evaluation.  BMI: Body mass index is 15.52 kg/(m^2).    Past Medical History  Diagnosis Date  . Insomnia    History reviewed. No pertinent past surgical history.    Medication List    TAKE these medications        acetaminophen 325 MG tablet  Commonly known as:  TYLENOL  Take 2 tablets (650 mg total) by mouth every 6 (six) hours as needed for mild pain (temp >/= 99.5).     carvedilol 3.125 MG tablet  Commonly known as:  COREG  Take 1 tablet (3.125 mg total) by mouth 2 (two) times daily with a meal.     feeding supplement (RESOURCE BREEZE) Liqd  Take 1 Container by mouth 2 (two) times daily between meals.     lisinopril 2.5 MG tablet  Commonly known as:  PRINIVIL,ZESTRIL  Take 1 tablet (2.5 mg total) by mouth daily.     Melatonin 1 MG Caps  Take 1 capsule by mouth at bedtime.     pantoprazole 40 MG tablet  Commonly known as:  PROTONIX  Take 1 tablet (40 mg total) by mouth daily.     senna-docusate 8.6-50 MG per tablet  Commonly known as:  Senokot-S  Take 1 tablet by  mouth at bedtime as needed for mild constipation.        LABORATORY STUDIES CBC    Component Value Date/Time   WBC 4.9 02/28/2015 1017   RBC 3.87 02/28/2015 1017   HGB 12.9 02/28/2015 1024   HCT 38.0 02/28/2015 1024   PLT 175 02/28/2015 1017   MCV 91.7 02/28/2015 1017   MCH 30.2 02/28/2015 1017   MCHC 33.0 02/28/2015 1017   RDW 12.3 02/28/2015 1017   LYMPHSABS 0.9 02/28/2015 1017   MONOABS 0.5 02/28/2015 1017   EOSABS 0.0 02/28/2015 1017   BASOSABS 0.0 02/28/2015 1017   CMP    Component Value Date/Time   NA 135 02/28/2015 1024   K 3.8 02/28/2015 1024   CL 101 02/28/2015 1024   CO2 21* 02/28/2015 1017   GLUCOSE 134* 02/28/2015 1024   BUN 12 02/28/2015 1024   CREATININE 0.90 02/28/2015 1024   CALCIUM 8.8* 02/28/2015 1017   PROT 6.4* 02/28/2015 1017   ALBUMIN 3.2* 02/28/2015 1017   AST 28 02/28/2015 1017   ALT 18 02/28/2015 1017   ALKPHOS 48 02/28/2015 1017   BILITOT 0.5 02/28/2015 1017   GFRNONAA 47* 02/28/2015 1017   GFRAA 55* 02/28/2015 1017   COAGS Lab Results  Component Value Date   INR  1.21 02/28/2015   Lipid Panel    Component Value Date/Time   CHOL 120 03/01/2015 0218   TRIG 60 03/01/2015 0218   HDL 47 03/01/2015 0218   CHOLHDL 2.6 03/01/2015 0218   VLDL 12 03/01/2015 0218   LDLCALC 61 03/01/2015 0218   HgbA1C  Lab Results  Component Value Date   HGBA1C 5.9* 03/01/2015   Cardiac Panel (last 3 results)  Recent Labs  03/01/15 1559  TROPONINI 0.06*   Urinalysis    Component Value Date/Time   COLORURINE YELLOW 03/02/2015 1235   APPEARANCEUR HAZY* 03/02/2015 1235   LABSPEC 1.007 03/02/2015 1235   PHURINE 6.0 03/02/2015 1235   GLUCOSEU NEGATIVE 03/02/2015 1235   HGBUR NEGATIVE 03/02/2015 1235   BILIRUBINUR NEGATIVE 03/02/2015 1235   KETONESUR NEGATIVE 03/02/2015 1235   PROTEINUR NEGATIVE 03/02/2015 1235   UROBILINOGEN 1.0 03/02/2015 1235   NITRITE NEGATIVE 03/02/2015 1235   LEUKOCYTESUR SMALL* 03/02/2015 1235   Urine Drug Screen     Component Value Date/Time   LABOPIA NONE DETECTED 03/02/2015 1235   COCAINSCRNUR NONE DETECTED 03/02/2015 1235   LABBENZ NONE DETECTED 03/02/2015 1235   AMPHETMU NONE DETECTED 03/02/2015 1235   THCU NONE DETECTED 03/02/2015 1235   LABBARB NONE DETECTED 03/02/2015 1235    Alcohol Level    Component Value Date/Time   ETH <5 02/28/2015 1017     SIGNIFICANT DIAGNOSTIC STUDIES   Ct Angio Head and neck W/cm &/or Wo Cm 02/28/2015  IMPRESSION: 50% diameter stenosis of the proximal left internal carotid artery. Ulcerated plaque in the left carotid bulb. This could be a source of emboli. Both vertebral arteries are patent to the basilar without significant stenosis No significant intracranial stenosis. 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 12 mm nodule left thyroid   Ct Head Wo Contrast  03/01/2015 IMPRESSION: Mild chronic microvascular ischemic changes are stable. No hemorrhage or acute infarct.   02/28/2015 IMPRESSION: 1. No acute intracranial abnormality. 2. Atrophy and chronic microvascular white matter ischemic changes.   Mr Brain Wo Contrast  02/28/2015 IMPRESSION: Mild hyperintense signal in the left convexity white matter, favor chronic ischemia. Complete MRI of the brain is suggested for further evaluation. The patient refused to have further MRI imaging.  Dg Chest Port 1 View 03/01/2015 IMPRESSION: Evidence of a degree of congestive heart failure superimposed on emphysematous change. No airspace consolidation.    Dg Chest Port 1 View 02/28/2015 IMPRESSION: Suboptimal inspiration accounts for bibasilar atelectasis. No acute cardiopulmonary disease otherwise.    Dg Chest Port 1 View 03/02/2015  IMPRESSION: No acute cardiopulmonary disease.    Mild stable cardiomegaly. Few small sub cm bilateral pulmonary nodules some of which may represent external EKG leads.  Recommend followup chest x-ray 4 weeks.   2D Echocardiogram - Left ventricle: The cavity size was mildly dilated. Wall thickness was increased in a pattern of moderate LVH. The estimated ejection fraction was 30%. Diffuse hypokinesis. - Mitral valve: There was moderate to severe regurgitation. - Left atrium: The atrium was moderately dilated. - Right ventricle: The cavity size was mildly dilated. Systolic function was moderately reduced. - Impressions: No cardiac source of embolism was identified, but cannot be ruled out on the basis of this examination. Impressions: - No cardiac source of embolism was identified, but cannot be ruled out on the basis of this examination.  EEG - Normal awake electroencephalogram. There are no focal lateralizing or epileptiform features.  EKG  sinus tachycardia. For complete results please see formal report.      HISTORY OF PRESENT ILLNESS  Tammy [name] is a 74 y.o. female history of confusion/cognitive decline presenting with acute onset of altered mental status and right sided weakness (LE>UE). Per EMS patient also initially non-verbal and not following commands. Upon arrival has fluctuating mental status, will have fluent speech followed by periods of no verbal output. Patient had fluctuating levels of weakness on the right side, transiently returning to baseline for a brief period. CT head and CT angio imaging reviewed shows no acute process but 50% stenosis within proximal left ICA. Due to uncertain nature of presentation, the patient was taken for a stat DWI imaging. Suspected area of restricted diffusion was seen in the left frontal parietal region consistent with suspected acute infarct. After review with neuroradiology there was question whether it represented a true acute infarct or chronic ischemia. Case then discussed with stroke neurologist, Dr Roda ShuttersXu, who felt history and MRI presentation consistent with acute infarct. Based on  presentation and symptoms decision made to proceed with IV tPA. Benefits and risks discussed with patients son who expressed understanding. No clear contraindications to tPA present.   Date last known well: 02/28/2015 Time last known well: 0930 tPA Given: yes  HOSPITAL COURSE Tammy [name] is a 74 y.o. female with history of current smoker admitted for altered mental status and right leg weakness. Limited MRI suspicious for left ACA stroke. Patient received TPA. Symptoms resolved.   Stroke: Dominant left ACA infarct s/p TPA, embolic vs. large vessel atherosclerosis   MRI limited study as noted above. Full study recommended however the patient refused.  CTA head and neck showed left ICA 50% stenosis  2D Echo EF 30%  EEG negative for seizure  LDL 61   HgbA1c 5.9  SCDs for VTE prophylaxis  Patient not compliant with medical direction, not a candidate for anticoagulation, no further embolic workup needed  DIET DYS 3 Room service appropriate?: Yes; Fluid consistency:: Thin   no antithrombotic prior to admission, now on aspirin 325 mg orally every day  Patient counseled to be compliant with her antithrombotic medications  Ongoing aggressive stroke risk factor management  Therapy recommendations: SNF   Disposition: SNF  Congestive heart failure  Wheezing during round  CXR showed evidence of CHF  Lasix 40 mg once  Patient improved after Lasix  Continue telemetry monitoring  Cardiology consulted and recommend to repeat 2D echo in 4 weeks and if still low EF will consider cardiac cath. However, doubt pt will follow up. Further follow-up in the cardiology congestive heart failure clinic was also recommended in 4 weeks.  Hypertension  Home meds: None  BP goal normotnesive  Currently on coreg and lisinopril  Stable  Patient counseled to be compliant with her blood pressure medications  Tobacco abuse  Current smoker  Smoking cessation  counseling provided  Pt is not ready to quit yet  Severe malnutrition  Due to chronic illness  Dietitian on board  Nutrition supplement  Other Stroke Risk Factors  Advanced age  Other Active Problems  Modification  Not compliant with medical direction  Severe spinal stenosis  Thyroid nodule noted on CT angiogram - further follow-up recommended  Pulmonary nodules noted on chest x-ray. Follow-up chest x-ray recommended in 4 weeks.  DISCHARGE EXAM Blood pressure 126/59, pulse 78, temperature 98.2 F (36.8 C), temperature source Oral, resp. rate 18, height 5\' 7"  (1.702 m), weight 44.951 kg (99 lb 1.6 oz),  SpO2 99 %.  General - Under nourished, 74 year old female, in mild respiratory distress.  Ophthalmologic - fundi not visualized due to noncooperation.  Cardiovascular - Regular rhythm, tachycardia.  Neck - supple, no carotid bruits  Mental Status -  Level of arousal and orientation to time, place, and person were intact. Language including expression, naming, repetition, comprehension was assessed and found intact. Fund of Knowledge was assessed and was intact.  Cranial Nerves II - XII - II - Visual field intact OU. III, IV, VI - Extraocular movements intact. V - Facial sensation intact bilaterally. VII - Facial movement intact bilaterally. VIII - Hearing & vestibular intact bilaterally. X - Palate elevates symmetrically. XI - Chin turning & shoulder shrug intact bilaterally. XII - Tongue protrusion intact.  Motor Strength - The patient's strength was normal in all extremities and pronator drift was absent. Bulk was normal and fasciculations were absent.  Motor Tone - Muscle tone was assessed at the neck and appendages and was normal.  Reflexes - The patient's reflexes were symmetrical in all extremities and she had no pathological reflexes.  Sensory - Light touch, temperature/pinprick were assessed and were symmetrical.   Coordination - The patient had  normal movements in the hands and feet with no ataxia or dysmetria. Tremor was absent.  Gait and Station - The patient's transfers, posture, gait, station, and turns were observed as normal.   Discharge Diet - Dysphagia 3 low sodium cardiac Fluid consistency: Thin liquids    DISCHARGE PLAN  Disposition:  Discharged to a skilled nursing facility  aspirin 325 mg orally every day for secondary stroke prevention.  Follow-up WHITE,CYNTHIA S, MD in 2 weeks.  Follow-up with Dr. Marvel Plan [facility] in 2 months.  Follow-up with cardiology for congestive heart failure clinic and repeat 2-D echocardiogram in 4 weeks.  Follow-up chest x-ray in 4 weeks for pulmonary nodules.  Follow-up thyroid nodule with Dr. Cliffton Asters.  30 minutes were spent preparing discharge.  Delton See PA-C [facility] Pager [phone] 03/04/2015, 2:52 PM I have personally examined this patient, reviewed notes, independently viewed imaging studies, participated in medical decision making and plan of care. I have made any additions or clarifications directly to the above note. Agree with note above.    Delia Heady, MD Medical Director [facility] Pager: [phone] 03/04/2015 5:51 PM

## 2015-03-04 NOTE — Progress Notes (Signed)
STROKE TEAM PROGRESS NOTE   SUBJECTIVE (INTERVAL HISTORY) Her family is not at the bedside.  Overall she feels her condition is resolved. She is agreeable to go to SNF today if insurance approves and is medically stable for this. .     OBJECTIVE Temp:  [98.2 F (36.8 C)-99.1 F (37.3 C)] 98.2 F (36.8 C) (05/28 2248) Pulse Rate:  [77-80] 78 (05/29 1023) Cardiac Rhythm:  [-] Normal sinus rhythm (05/28 2300) Resp:  [18-20] 18 (05/29 1023) BP: (107-126)/(41-68) 126/59 mmHg (05/29 1023) SpO2:  [99 %-100 %] 99 % (05/29 1023)  No results for input(s): GLUCAP in the last 168 hours.  Recent Labs Lab 02/28/15 1017 02/28/15 1024  NA 134* 135  K 3.9 3.8  CL 102 101  CO2 21*  --   GLUCOSE 130* 134*  BUN 12 12  CREATININE 1.11* 0.90  CALCIUM 8.8*  --     Recent Labs Lab 02/28/15 1017  AST 28  ALT 18  ALKPHOS 48  BILITOT 0.5  PROT 6.4*  ALBUMIN 3.2*    Recent Labs Lab 02/28/15 1017 02/28/15 1024  WBC 4.9  --   NEUTROABS 3.4  --   HGB 11.7* 12.9  HCT 35.5* 38.0  MCV 91.7  --   PLT 175  --     Recent Labs Lab 03/01/15 1057 03/01/15 1559  TROPONINI 0.06* 0.06*   No results for input(s): LABPROT, INR in the last 72 hours.  Recent Labs  03/02/15 1235  COLORURINE YELLOW  LABSPEC 1.007  PHURINE 6.0  GLUCOSEU NEGATIVE  HGBUR NEGATIVE  BILIRUBINUR NEGATIVE  KETONESUR NEGATIVE  PROTEINUR NEGATIVE  UROBILINOGEN 1.0  NITRITE NEGATIVE  LEUKOCYTESUR SMALL*       Component Value Date/Time   CHOL 120 03/01/2015 0218   TRIG 60 03/01/2015 0218   HDL 47 03/01/2015 0218   CHOLHDL 2.6 03/01/2015 0218   VLDL 12 03/01/2015 0218   LDLCALC 61 03/01/2015 0218   Lab Results  Component Value Date   HGBA1C 5.9* 03/01/2015      Component Value Date/Time   LABOPIA NONE DETECTED 03/02/2015 1235   COCAINSCRNUR NONE DETECTED 03/02/2015 1235   LABBENZ NONE DETECTED 03/02/2015 1235   AMPHETMU NONE DETECTED 03/02/2015 1235   THCU NONE DETECTED 03/02/2015 1235    LABBARB NONE DETECTED 03/02/2015 1235     Recent Labs Lab 02/28/15 1017  ETH <5    I have personally reviewed the radiological images below and agree with the radiology interpretations.  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 Ulcerated plaque in the left carotid bulb. This could be a source of emboli.  Both vertebral arteries are patent to the basilar without significant stenosis  No significant intracranial stenosis.  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  12 mm nodule left thyroid     Ct Head Wo Contrast  03/01/2015   IMPRESSION: Mild chronic microvascular ischemic changes are stable. No hemorrhage or acute infarct.    02/28/2015   IMPRESSION: 1. No acute intracranial abnormality. 2. Atrophy and chronic microvascular white matter ischemic changes.   Mr Brain Wo Contrast  02/28/2015  IMPRESSION: Mild hyperintense signal in the left convexity white matter, favor chronic ischemia. Complete MRI of the brain is suggested for further evaluation     Dg Chest [facility]  03/01/2015  IMPRESSION: Evidence of a degree of congestive heart failure superimposed on emphysematous change. No airspace consolidation.    02/28/2015    IMPRESSION: Suboptimal inspiration accounts for bibasilar atelectasis. No acute cardiopulmonary disease otherwise.   2D Echocardiogram  - Left ventricle: The cavity size was mildly dilated. Wall thickness was increased in a pattern of moderate LVH. The estimated ejection fraction was 30%. Diffuse hypokinesis. - Mitral valve: There was moderate to severe regurgitation. - Left atrium: The atrium was moderately dilated. - Right ventricle: The cavity size was mildly dilated. Systolic function was moderately reduced. - Impressions: No cardiac source of embolism was  identified, but cannot be ruled out on the basis of this examination. Impressions: - No cardiac source of embolism was identified, but cannot be ruled out on the basis of this examination.  EEG - Normal awake electroencephalogram. There are no focal lateralizing or epileptiform features.  EKG  sinus tachycardia. For complete results please see formal report.  PHYSICAL EXAM  Temp:  [98.2 F (36.8 C)-99.1 F (37.3 C)] 98.2 F (36.8 C) (05/28 2248) Pulse Rate:  [77-80] 78 (05/29 1023) Resp:  [18-20] 18 (05/29 1023) BP: (107-126)/(41-68) 126/59 mmHg (05/29 1023) SpO2:  [99 %-100 %] 99 % (05/29 1023)  General - frail elderly african american lady not in distress., in mild respiratory distress.  Ophthalmologic - fundi not visualized due to noncooperation.  Cardiovascular - Regular rhythm, tachycardia.  Neck - supple, no carotid bruits  Mental Status -  Level of arousal and orientation to time, place, and person were intact. Language including expression, naming, repetition, comprehension was assessed and found intact. Fund of Knowledge was assessed and was intact.  Cranial Nerves II - XII - II - Visual field intact OU. III, IV, VI - Extraocular movements intact. V - Facial sensation intact bilaterally. VII - Facial movement intact bilaterally. VIII - Hearing & vestibular intact bilaterally. X - Palate elevates symmetrically. XI - Chin turning & shoulder shrug intact bilaterally. XII - Tongue protrusion intact.  Motor Strength - The patient's strength was normal in all extremities and pronator drift was absent.  Bulk was normal and fasciculations were absent.   Motor Tone - Muscle tone was assessed at the neck and appendages and was normal.  Reflexes - The patient's reflexes were symmetrical in all extremities and she had no pathological reflexes.  Sensory - Light touch, temperature/pinprick were assessed and were symmetrical.    Coordination - The patient had normal  movements in the hands and feet with no ataxia or dysmetria.  Tremor was absent.  Gait and Station - The patient's transfers, posture, gait, station, and turns were observed as normal.   ASSESSMENT/PLAN [name] is a 74 y.o. female with history of current smoker admitted for altered mental status and right leg weakness. Limited MRI suspicious for left ACA stroke. Patient received TPA. Symptoms resolved.    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    no antithrombotic prior to admission, now on aspirin 325 mg  orally every day  Patient counseled to be compliant with her antithrombotic medications  Ongoing aggressive stroke risk factor management  Therapy recommendations:  SNF   Disposition:  SNF  Congestive heart failure  Wheezing during round  CXR showed evidence of CHF  Lasix 40 mg once  Patient improved after Lasix  Continue telemetry monitoring  Cardiology consulted and recommend to repeat 2D echo in 4 weeks and if still low EF will consider cardiac cath. However, doubt pt will follow up.   Hypertension  Home meds:   None BP goal normotnesive Currently on coreg and lisinopril  Stable  Patient counseled to be compliant with her blood pressure medications  Tobacco abuse  Current smoker  Smoking cessation counseling provided  Pt is not ready to quit yet  Severe malnutrition  Due to chronic illness  Dietitian on board  Nutrition supplement  Other Stroke Risk Factors  Advanced age  Other Active  Problems  Modification  Not compliant with medical direction  Other Pertinent History    Hospital day # 4  S/p discussion with patient today. She is agreeable to be transferred to  SNF.  Social work is seeing the pt.  okay for transfer to SNF when bed available and after insurance approval   Mihaela Fajardo  Stroke Neurology 03/04/2015 2:23 PM    To contact Stroke Continuity provider, please refer to [email or web address]. After hours, contact General Neurology

## 2015-03-04 NOTE — Progress Notes (Addendum)
Addendum: Patient is set to DC to SNF today, if medically cleared by MD. Patient can transfer to [facility] and RN is calling MD to verify plan. [facility]. LCSW will contact family and complete DC packet.  Unclear of transport but will discuss with family. Patient to be discharged today! Son will be providing transport for patient. LCSW cleared it with MD and DC summary completed. Packet sent with patient. DC to [facility].     RN called to notify LCSW that patient is ready for DC. Called [facility] to confirm patient being accepted and can take today. [facility] and will follow up.  If insurance is agreeable patient can transport to SNF today.  RN made aware.  Awaiting call back with insurance authorization. Will update family once more is known.  Tammy EmoryHannah Callahan Wild LCSW, MSW Clinical Social Work: Emergency Room [phone]

## 2015-03-30 ENCOUNTER — Other Ambulatory Visit (HOSPITAL_COMMUNITY): Payer: Medicare Other

## 2015-04-02 ENCOUNTER — Ambulatory Visit (HOSPITAL_COMMUNITY): Payer: Medicare Other | Attending: Cardiology

## 2015-04-02 ENCOUNTER — Other Ambulatory Visit: Payer: Self-pay | Admitting: Family Medicine

## 2015-04-02 ENCOUNTER — Other Ambulatory Visit: Payer: Self-pay

## 2015-04-02 DIAGNOSIS — I429 Cardiomyopathy, unspecified: Secondary | ICD-10-CM | POA: Diagnosis present

## 2015-04-02 DIAGNOSIS — E041 Nontoxic single thyroid nodule: Secondary | ICD-10-CM

## 2015-04-02 DIAGNOSIS — I340 Nonrheumatic mitral (valve) insufficiency: Secondary | ICD-10-CM | POA: Insufficient documentation

## 2015-04-02 DIAGNOSIS — I517 Cardiomegaly: Secondary | ICD-10-CM | POA: Diagnosis not present

## 2015-04-02 DIAGNOSIS — I10 Essential (primary) hypertension: Secondary | ICD-10-CM

## 2015-04-02 NOTE — Progress Notes (Unsigned)
Patient very uncooperative during test.  Complaining and pushing sonographer's hands away.  Test was performed to best of my ability.

## 2015-04-04 ENCOUNTER — Other Ambulatory Visit: Payer: Medicare Other

## 2015-04-05 ENCOUNTER — Ambulatory Visit: Payer: Medicare Other | Admitting: Neurology

## 2015-04-13 ENCOUNTER — Ambulatory Visit
Admission: RE | Admit: 2015-04-13 | Discharge: 2015-04-13 | Disposition: A | Discharge status: Home / Self Care | Payer: Medicare Other | Source: Ambulatory Visit | Attending: Family Medicine | Admitting: Family Medicine

## 2015-04-13 DIAGNOSIS — E041 Nontoxic single thyroid nodule: Secondary | ICD-10-CM

## 2015-04-25 ENCOUNTER — Other Ambulatory Visit: Payer: Self-pay | Admitting: Family Medicine

## 2015-04-25 DIAGNOSIS — E041 Nontoxic single thyroid nodule: Secondary | ICD-10-CM

## 2015-05-01 ENCOUNTER — Ambulatory Visit: Payer: Medicare Other | Admitting: Internal Medicine

## 2015-05-02 ENCOUNTER — Encounter (HOSPITAL_COMMUNITY): Payer: Self-pay | Admitting: Family Medicine

## 2015-05-02 ENCOUNTER — Ambulatory Visit (HOSPITAL_COMMUNITY): Admission: RE | Admit: 2015-05-02 | Payer: Medicare Other | Source: Ambulatory Visit

## 2015-05-02 ENCOUNTER — Emergency Department (HOSPITAL_COMMUNITY)
Admission: EM | Admit: 2015-05-02 | Discharge: 2015-05-02 | Discharge status: Against Medical Advice | Payer: Medicare Other | Attending: Emergency Medicine | Admitting: Emergency Medicine

## 2015-05-02 ENCOUNTER — Emergency Department (HOSPITAL_COMMUNITY): Payer: Medicare Other

## 2015-05-02 DIAGNOSIS — Y9289 Other specified places as the place of occurrence of the external cause: Secondary | ICD-10-CM | POA: Diagnosis not present

## 2015-05-02 DIAGNOSIS — R531 Weakness: Secondary | ICD-10-CM | POA: Diagnosis present

## 2015-05-02 DIAGNOSIS — S99911A Unspecified injury of right ankle, initial encounter: Secondary | ICD-10-CM | POA: Diagnosis not present

## 2015-05-02 DIAGNOSIS — Z720 Tobacco use: Secondary | ICD-10-CM | POA: Diagnosis not present

## 2015-05-02 DIAGNOSIS — R296 Repeated falls: Secondary | ICD-10-CM

## 2015-05-02 DIAGNOSIS — Y998 Other external cause status: Secondary | ICD-10-CM | POA: Insufficient documentation

## 2015-05-02 DIAGNOSIS — N179 Acute kidney failure, unspecified: Secondary | ICD-10-CM

## 2015-05-02 DIAGNOSIS — I10 Essential (primary) hypertension: Secondary | ICD-10-CM | POA: Insufficient documentation

## 2015-05-02 DIAGNOSIS — W1839XA Other fall on same level, initial encounter: Secondary | ICD-10-CM | POA: Diagnosis not present

## 2015-05-02 DIAGNOSIS — G4700 Insomnia, unspecified: Secondary | ICD-10-CM | POA: Diagnosis not present

## 2015-05-02 DIAGNOSIS — Y9389 Activity, other specified: Secondary | ICD-10-CM | POA: Insufficient documentation

## 2015-05-02 HISTORY — DX: Essential (primary) hypertension: I10

## 2015-05-02 LAB — URINE MICROSCOPIC-ADD ON

## 2015-05-02 LAB — CBC
HCT: 38.4 % (ref 36.0–46.0)
Hemoglobin: 13.1 g/dL (ref 12.0–15.0)
MCH: 30.6 pg (ref 26.0–34.0)
MCHC: 34.1 g/dL (ref 30.0–36.0)
MCV: 89.7 fL (ref 78.0–100.0)
Platelets: 232 10*3/uL (ref 150–400)
RBC: 4.28 MIL/uL (ref 3.87–5.11)
RDW: 12.7 % (ref 11.5–15.5)
WBC: 3.7 10*3/uL — ABNORMAL LOW (ref 4.0–10.5)

## 2015-05-02 LAB — URINALYSIS, ROUTINE W REFLEX MICROSCOPIC
Glucose, UA: NEGATIVE mg/dL
Hgb urine dipstick: NEGATIVE
Ketones, ur: NEGATIVE mg/dL
Nitrite: NEGATIVE
Protein, ur: NEGATIVE mg/dL
Specific Gravity, Urine: 1.022 (ref 1.005–1.030)
Urobilinogen, UA: 1 mg/dL (ref 0.0–1.0)
pH: 6 (ref 5.0–8.0)

## 2015-05-02 LAB — BASIC METABOLIC PANEL
Anion gap: 12 (ref 5–15)
BUN: 33 mg/dL — ABNORMAL HIGH (ref 6–20)
CO2: 21 mmol/L — ABNORMAL LOW (ref 22–32)
Calcium: 9.5 mg/dL (ref 8.9–10.3)
Chloride: 102 mmol/L (ref 101–111)
Creatinine, Ser: 1.51 mg/dL — ABNORMAL HIGH (ref 0.44–1.00)
GFR calc Af Amer: 38 mL/min — ABNORMAL LOW (ref >60)
GFR calc non Af Amer: 33 mL/min — ABNORMAL LOW (ref >60)
Glucose, Bld: 143 mg/dL — ABNORMAL HIGH (ref 65–99)
Potassium: 4.8 mmol/L (ref 3.5–5.1)
Sodium: 135 mmol/L (ref 135–145)

## 2015-05-02 LAB — I-STAT CG4 LACTIC ACID, ED: Lactic Acid, Venous: 1.39 mmol/L (ref 0.5–2.0)

## 2015-05-02 MED ORDER — ACETAMINOPHEN 160 MG/5ML PO SOLN
ORAL | Status: AC
  Administered 2015-05-02: 650 mg via ORAL
  Filled 2015-05-02: qty 20.3

## 2015-05-02 MED ORDER — ACETAMINOPHEN 325 MG PO TABS: 650.0000 mg | ORAL_TABLET | Freq: Once | ORAL | Status: DC | PRN

## 2015-05-02 MED ORDER — ACETAMINOPHEN 160 MG/5ML PO SOLN: 15.0000 mg/kg | Freq: Once | ORAL | Status: DC

## 2015-05-02 MED ORDER — ACETAMINOPHEN 160 MG/5ML PO SOLN
650.0000 mg | Freq: Once | ORAL | Status: AC
  Administered 2015-05-02: 650 mg via ORAL
  Filled 2015-05-02: qty 20.3

## 2015-05-02 NOTE — Discharge Instructions (Signed)
Acute Kidney Injury Acute kidney injury is a disease in which there is sudden (acute) damage to the kidneys. The kidneys are 2 organs that lie on either side of the spine between the middle of the back and the front of the abdomen. The kidneys:  Remove wastes and extra water from the blood.   Produce important hormones. These help keep bones strong, regulate blood pressure, and help create red blood cells.   Balance the fluids and chemicals in the blood and tissues. A small amount of kidney damage may not cause problems, but a large amount of damage may make it difficult or impossible for the kidneys to work the way they should. Acute kidney injury may develop into long-lasting (chronic) kidney disease. It may also develop into a life-threatening disease called end-stage kidney disease. Acute kidney injury can get worse very quickly, so it should be treated right away. Early treatment may prevent other kidney diseases from developing.  CAUSES   A problem with blood flow to the kidneys. This may be caused by:   Blood loss.   Heart disease.   Severe burns.   Liver disease.  Direct damage to the kidneys. This may be caused by:  Some medicines.   A kidney infection.   Poisoning or consuming toxic substances.   A surgical wound.   A blow to the kidney area.   A problem with urine flow. This may be caused by:   Cancer.   Kidney stones.   An enlarged prostate. SYMPTOMS   Swelling (edema) of the legs, ankles, or feet.   Tiredness (lethargy).   Nausea or vomiting.   Confusion.   Problems with urination, such as:   Painful or burning feeling during urination.   Decreased urine production.   Frequent accidents in children who are potty trained.   Bloody urine.   Muscle twitches and cramps.   Shortness of breath.   Seizures.   Chest pain or pressure. Sometimes, no symptoms are present. DIAGNOSIS Acute kidney injury may be detected  and diagnosed by tests, including blood, urine, imaging, or kidney biopsy tests.  TREATMENT Treatment of acute kidney injury varies depending on the cause and severity of the kidney damage. In mild cases, no treatment may be needed. The kidneys may heal on their own. If acute kidney injury is more severe, your caregiver will treat the cause of the kidney damage, help the kidneys heal, and prevent complications from occurring. Severe cases may require a procedure to remove toxic wastes from the body (dialysis) or surgery to repair kidney damage. Surgery may involve:   Repair of a torn kidney.   Removal of an obstruction. Most of the time, you will need to stay overnight at the hospital.  HOME CARE INSTRUCTIONS:  Follow your prescribed diet.  Only take over-the-counter or prescription medicines as directed by your caregiver.  Do not take any new medicines (prescription, over-the-counter, or nutritional supplements) unless approved by your caregiver. Many medicines can worsen your kidney damage or need to have the dose adjusted.   Keep all follow-up appointments as directed by your caregiver.  Observe your condition to make sure you are healing as expected. SEEK IMMEDIATE MEDICAL CARE IF:  You are feeling ill or have severe pain in the back or side.   Your symptoms return or you have new symptoms.  You have any symptoms of end-stage kidney disease. These include:   Persistent itchiness.   Loss of appetite.   Headaches.   Abnormally dark   or light skin.  Numbness in the hands or feet.   Easy bruising.   Frequent hiccups.   Menstruation stops.   You have a fever.  You have increased urine production.  You have pain or bleeding when urinating. MAKE SURE YOU:   Understand these instructions.  Will watch your condition.  Will get help right away if you are not doing well or get worse Document Released: 04/07/2011 Document Revised: 01/17/2013 Document  Reviewed: 05/21/2012 ExitCare Patient Information 2015 [employer]. This information is not intended to replace advice given to you by your health care provider. Make sure you discuss any questions you have with your health care provider.  

## 2015-05-02 NOTE — ED Notes (Signed)
Pt placed into gown and on monitor upon arrival to room. Pt monitored by blood pressure and pulse ox. pts family remains at bedside.  

## 2015-05-02 NOTE — ED Notes (Signed)
Pt states that she has fallen twice in the last 2 days. Pt states that she just falls. She denies feeling dizziness or lightheaded before falling, and denies tripping over things.

## 2015-05-02 NOTE — ED Notes (Signed)
Dr.Knapp at bedside  

## 2015-05-02 NOTE — ED Notes (Signed)
This RN went into pt's room to get her EKG, and she found the pt getting dressed. Pt states "I'm not staying in no hospital". Roselyn Bering, MD notified and is at bedside, speaking to the pt's son.

## 2015-05-02 NOTE — ED Notes (Signed)
Per family, Pt here with weakness, loss of appetite and has been falling the last 2 days.

## 2015-05-02 NOTE — ED Provider Notes (Signed)
CSN: [id number]     Arrival date & time 05/02/15  1620 History   First MD Initiated Contact with Patient 05/02/15 1951     Chief Complaint  Patient presents with  . Fall  . Weakness   PMHx, ROS, and PE limited due to patient compliance.   (Consider location/radiation/quality/duration/timing/severity/associated sxs/prior Treatment) HPI Patient is a 74 year old female with hypertension presented today after 2 days of increasing weakness and falls. She denies any frank chest pain, shortness of breath, nausea, vomiting. She reports she has had multiple mechanical falls in the past. Per the son she has also had decreased by mouth intake over the last several days. He reports she may have the beginnings of dementia but no frank diagnosis at this time. Patient reports having ankle surgery multiple years ago with intermittent tenderness in her right ankle. States it has increased and is having difficulty walking on it at this time.  Pt uncooperative with hx, ROS, and exam and would not rate or characterize right ankle pain.   Past Medical History  Diagnosis Date  . Insomnia   . Hypertension    History reviewed. No pertinent past surgical history. Family History  Problem Relation Age of Onset  . Congestive Heart Failure Son 55   History  Substance Use Topics  . Smoking status: Current Every Day Smoker    Types: Cigarettes  . Smokeless tobacco: Not on file  . Alcohol Use: No   OB History    No data available     Review of Systems  Unable to perform ROS: Other  Pt refuses to answer questions.   Allergies  Review of patient's allergies indicates no known allergies.  Home Medications   Prior to Admission medications   Medication Sig Start Date End Date Taking? Authorizing Provider  acetaminophen (TYLENOL) 325 MG tablet Take 2 tablets (650 mg total) by mouth every 6 (six) hours as needed for mild pain (temp >/= 99.5). 03/04/15   David L Rinehuls, PA-C  albuterol (PROVENTIL) (2.5  MG/3ML) 0.083% nebulizer solution Take 3 mLs (2.5 mg total) by nebulization every 4 (four) hours as needed for wheezing or shortness of breath. 03/04/15   Kinnie Scales Rinehuls, PA-C  aspirin 325 MG tablet Take 1 tablet (325 mg total) by mouth daily. 03/04/15   David L Rinehuls, PA-C  carvedilol (COREG) 3.125 MG tablet Take 1 tablet (3.125 mg total) by mouth 2 (two) times daily with a meal. 03/04/15   David L Rinehuls, PA-C  feeding supplement, RESOURCE BREEZE, (RESOURCE BREEZE) LIQD Take 1 Container by mouth 2 (two) times daily between meals. 03/04/15   David L Rinehuls, PA-C  lisinopril (PRINIVIL,ZESTRIL) 2.5 MG tablet Take 1 tablet (2.5 mg total) by mouth daily. 03/04/15   David L Rinehuls, PA-C  Melatonin 1 MG CAPS Take 1 capsule by mouth at bedtime.    Historical Provider, MD  pantoprazole (PROTONIX) 40 MG tablet Take 1 tablet (40 mg total) by mouth daily. 03/04/15   David L Rinehuls, PA-C  senna-docusate (SENOKOT-S) 8.6-50 MG per tablet Take 1 tablet by mouth at bedtime as needed for mild constipation. 03/04/15   David L Rinehuls, PA-C   BP 97/49 mmHg  Pulse 64  Temp(Src) 98.2 F (36.8 C) (Oral)  Resp 20  SpO2 98% Physical Exam  Constitutional: She is oriented to person, place, and time. Vital signs are normal. She appears cachectic.  HENT:  Head: Normocephalic and atraumatic.  Eyes: Conjunctivae and EOM are normal. Pupils are equal, round, and reactive  to light.  Neck: Normal range of motion. Neck supple.  Cardiovascular: Normal rate, regular rhythm and normal heart sounds.   Pulmonary/Chest: Effort normal and breath sounds normal. No respiratory distress.  Abdominal: Soft. Bowel sounds are normal. There is no tenderness.  Musculoskeletal: Normal range of motion.       Right ankle: She exhibits swelling. Tenderness.  Neurological: She is alert and oriented to person, place, and time. She has normal reflexes. No cranial nerve deficit.  Skin: Skin is warm and dry.  Psychiatric: She has a normal  mood and affect.    ED Course  Procedures (including critical care time) Labs Review Labs Reviewed  BASIC METABOLIC PANEL - Abnormal; Notable for the following:    CO2 21 (*)    Glucose, Bld 143 (*)    BUN 33 (*)    Creatinine, Ser 1.51 (*)    GFR calc non Af Amer 33 (*)    GFR calc Af Amer 38 (*)    All other components within normal limits  CBC - Abnormal; Notable for the following:    WBC 3.7 (*)    All other components within normal limits  URINALYSIS, ROUTINE W REFLEX MICROSCOPIC (NOT AT [facility]) - Abnormal; Notable for the following:    APPearance CLOUDY (*)    Bilirubin Urine SMALL (*)    Leukocytes, UA LARGE (*)    All other components within normal limits  URINE MICROSCOPIC-ADD ON - Abnormal; Notable for the following:    Squamous Epithelial / LPF MANY (*)    Bacteria, UA FEW (*)    All other components within normal limits  URINE CULTURE  I-STAT CG4 LACTIC ACID, ED    Imaging Review Dg Chest 2 View  05/02/2015   CLINICAL DATA:  Fever with cough and congestion  EXAM: CHEST  2 VIEW  COMPARISON:  Mar 02, 2015  FINDINGS: There is no edema or consolidation. Heart is upper normal in size with pulmonary vascularity within normal limits. No adenopathy. There is atherosclerotic change in the aortic arch region. There is degenerative change in the thoracic spine.  IMPRESSION: No edema or consolidation.   Electronically Signed   By: Bretta Bang III M.D.   On: 05/02/2015 17:32     EKG Interpretation None      MDM   Final diagnoses:  AKI (acute kidney injury)  Falls frequently    On evaluation pt was HDS in NAD.  A&O times 3.  Pt took off EKG leads prior to EKG being performed and would not allow to perform full exam.  Brief neuro exam showing no acute abnormalities.  Pt with full mental capacity on my exam.  Tenderness with walking on right ankle and unable to walk on own well.  I ordered x-ray of the ankle but not performed. UA showing trace leuks and culture sent.  No dysuria and will not treat at this time. CBC with no leukocytosis or anemia. BMP showing a creatinine of 1.5 and BUN of 33. Baseline creatinine of 1. Advised patient I recommended she come into the hospital for fluid therapy and nutritional consultation as well as evaluation of her right ankle. Patient ripped off her EKG leads and begin attempted to walk out of the room. Son reported he was able to help his mother at home. Patient subsequently left AMA with full understanding she could go into renal failure and die.  She stated "I'm a grown women. I'm 74 years old! If I die, then I die!"  Advised to f/u with PCP or return to ED as soon as possible if she changes her mind.   If performed, labs, EKGs, and imaging were reviewed/interpreted by myself and my attending and incorporated into medical decision making.  Pt care supervised by my attending Dr. Lynelle Doctor.   Tery Sanfilippo, MD PGY-2  Emergency Medicine      Tery Sanfilippo, MD 05/03/15 1316  Linwood Dibbles, MD 05/03/15 [phone]

## 2015-05-04 LAB — URINE CULTURE

## 2015-05-28 ENCOUNTER — Telehealth: Payer: Self-pay | Admitting: *Deleted

## 2015-05-28 NOTE — Telephone Encounter (Signed)
Left message for son, Debby Bud to call back about appt with Dr. Lucia Gaskins tomorrow. Please see if they can reschedule with Dr. Pearlean Brownie on 8/29 for 30 minute appt if he calls back. Thank you! Let them know he is the stroke specialist here in the office.

## 2015-05-29 ENCOUNTER — Ambulatory Visit: Payer: Medicare Other | Admitting: Neurology

## 2015-08-18 ENCOUNTER — Inpatient Hospital Stay (HOSPITAL_COMMUNITY)
Admission: EM | Admit: 2015-08-18 | Discharge: 2015-08-22 | DRG: 480 | Disposition: A | Discharge status: Skilled Nursing Facility | Payer: Medicare Other | Attending: Internal Medicine | Admitting: Internal Medicine

## 2015-08-18 ENCOUNTER — Emergency Department (HOSPITAL_COMMUNITY): Payer: Medicare Other

## 2015-08-18 ENCOUNTER — Encounter (HOSPITAL_COMMUNITY): Payer: Self-pay | Admitting: Emergency Medicine

## 2015-08-18 DIAGNOSIS — S72142A Displaced intertrochanteric fracture of left femur, initial encounter for closed fracture: Secondary | ICD-10-CM | POA: Diagnosis present

## 2015-08-18 DIAGNOSIS — K219 Gastro-esophageal reflux disease without esophagitis: Secondary | ICD-10-CM | POA: Diagnosis present

## 2015-08-18 DIAGNOSIS — I420 Dilated cardiomyopathy: Secondary | ICD-10-CM | POA: Diagnosis present

## 2015-08-18 DIAGNOSIS — W010XXA Fall on same level from slipping, tripping and stumbling without subsequent striking against object, initial encounter: Secondary | ICD-10-CM | POA: Diagnosis present

## 2015-08-18 DIAGNOSIS — Y9301 Activity, walking, marching and hiking: Secondary | ICD-10-CM | POA: Diagnosis present

## 2015-08-18 DIAGNOSIS — D62 Acute posthemorrhagic anemia: Secondary | ICD-10-CM | POA: Diagnosis not present

## 2015-08-18 DIAGNOSIS — E43 Unspecified severe protein-calorie malnutrition: Secondary | ICD-10-CM | POA: Diagnosis present

## 2015-08-18 DIAGNOSIS — Z681 Body mass index (BMI) 19 or less, adult: Secondary | ICD-10-CM | POA: Diagnosis not present

## 2015-08-18 DIAGNOSIS — I10 Essential (primary) hypertension: Secondary | ICD-10-CM | POA: Diagnosis present

## 2015-08-18 DIAGNOSIS — I5042 Chronic combined systolic (congestive) and diastolic (congestive) heart failure: Secondary | ICD-10-CM | POA: Diagnosis present

## 2015-08-18 DIAGNOSIS — R296 Repeated falls: Secondary | ICD-10-CM | POA: Diagnosis present

## 2015-08-18 DIAGNOSIS — Z419 Encounter for procedure for purposes other than remedying health state, unspecified: Secondary | ICD-10-CM

## 2015-08-18 DIAGNOSIS — R52 Pain, unspecified: Secondary | ICD-10-CM

## 2015-08-18 DIAGNOSIS — F17210 Nicotine dependence, cigarettes, uncomplicated: Secondary | ICD-10-CM | POA: Diagnosis present

## 2015-08-18 DIAGNOSIS — Z8249 Family history of ischemic heart disease and other diseases of the circulatory system: Secondary | ICD-10-CM | POA: Diagnosis not present

## 2015-08-18 DIAGNOSIS — Z79899 Other long term (current) drug therapy: Secondary | ICD-10-CM | POA: Diagnosis not present

## 2015-08-18 DIAGNOSIS — J449 Chronic obstructive pulmonary disease, unspecified: Secondary | ICD-10-CM | POA: Diagnosis present

## 2015-08-18 DIAGNOSIS — G4700 Insomnia, unspecified: Secondary | ICD-10-CM | POA: Diagnosis present

## 2015-08-18 DIAGNOSIS — S72002A Fracture of unspecified part of neck of left femur, initial encounter for closed fracture: Secondary | ICD-10-CM | POA: Diagnosis present

## 2015-08-18 DIAGNOSIS — Y92002 Bathroom of unspecified non-institutional (private) residence single-family (private) house as the place of occurrence of the external cause: Secondary | ICD-10-CM | POA: Diagnosis not present

## 2015-08-18 DIAGNOSIS — M25552 Pain in left hip: Secondary | ICD-10-CM | POA: Diagnosis present

## 2015-08-18 DIAGNOSIS — Z825 Family history of asthma and other chronic lower respiratory diseases: Secondary | ICD-10-CM | POA: Diagnosis not present

## 2015-08-18 DIAGNOSIS — I110 Hypertensive heart disease with heart failure: Secondary | ICD-10-CM | POA: Diagnosis present

## 2015-08-18 DIAGNOSIS — Z7982 Long term (current) use of aspirin: Secondary | ICD-10-CM | POA: Diagnosis not present

## 2015-08-18 DIAGNOSIS — Z8673 Personal history of transient ischemic attack (TIA), and cerebral infarction without residual deficits: Secondary | ICD-10-CM | POA: Diagnosis not present

## 2015-08-18 DIAGNOSIS — R64 Cachexia: Secondary | ICD-10-CM | POA: Diagnosis present

## 2015-08-18 DIAGNOSIS — S72009A Fracture of unspecified part of neck of unspecified femur, initial encounter for closed fracture: Secondary | ICD-10-CM

## 2015-08-18 LAB — CBC WITH DIFFERENTIAL/PLATELET
Basophils Absolute: 0 10*3/uL (ref 0.0–0.1)
Basophils Relative: 0 %
Eosinophils Absolute: 0.2 10*3/uL (ref 0.0–0.7)
Eosinophils Relative: 2 %
HCT: 32.9 % — ABNORMAL LOW (ref 36.0–46.0)
Hemoglobin: 10.9 g/dL — ABNORMAL LOW (ref 12.0–15.0)
Lymphocytes Relative: 15 %
Lymphs Abs: 1.3 10*3/uL (ref 0.7–4.0)
MCH: 31.4 pg (ref 26.0–34.0)
MCHC: 33.1 g/dL (ref 30.0–36.0)
MCV: 94.8 fL (ref 78.0–100.0)
Monocytes Absolute: 0.3 10*3/uL (ref 0.1–1.0)
Monocytes Relative: 4 %
Neutro Abs: 6.8 10*3/uL (ref 1.7–7.7)
Neutrophils Relative %: 79 %
Platelets: 265 10*3/uL (ref 150–400)
RBC: 3.47 MIL/uL — ABNORMAL LOW (ref 3.87–5.11)
RDW: 13.1 % (ref 11.5–15.5)
WBC: 8.6 10*3/uL (ref 4.0–10.5)

## 2015-08-18 LAB — COMPREHENSIVE METABOLIC PANEL
ALT: 20 U/L (ref 14–54)
AST: 29 U/L (ref 15–41)
Albumin: 3.9 g/dL (ref 3.5–5.0)
Alkaline Phosphatase: 58 U/L (ref 38–126)
Anion gap: 8 (ref 5–15)
BUN: 27 mg/dL — ABNORMAL HIGH (ref 6–20)
CO2: 25 mmol/L (ref 22–32)
Calcium: 9.3 mg/dL (ref 8.9–10.3)
Chloride: 99 mmol/L — ABNORMAL LOW (ref 101–111)
Creatinine, Ser: 1.02 mg/dL — ABNORMAL HIGH (ref 0.44–1.00)
GFR calc Af Amer: >60 mL/min (ref >60)
GFR calc non Af Amer: 52 mL/min — ABNORMAL LOW (ref >60)
Glucose, Bld: 135 mg/dL — ABNORMAL HIGH (ref 65–99)
Potassium: 4.9 mmol/L (ref 3.5–5.1)
Sodium: 132 mmol/L — ABNORMAL LOW (ref 135–145)
Total Bilirubin: 0.3 mg/dL (ref 0.3–1.2)
Total Protein: 7 g/dL (ref 6.5–8.1)

## 2015-08-18 LAB — APTT: aPTT: 30 seconds (ref 24–37)

## 2015-08-18 LAB — PROTIME-INR
INR: 1.08 (ref 0.00–1.49)
Prothrombin Time: 14.2 seconds (ref 11.6–15.2)

## 2015-08-18 MED ORDER — OXYCODONE-ACETAMINOPHEN 5-325 MG PO TABS
1.0000 | ORAL_TABLET | ORAL | Status: DC | PRN
  Administered 2015-08-19 – 2015-08-22 (×7): 2 via ORAL
  Filled 2015-08-18 (×7): qty 2

## 2015-08-18 MED ORDER — CHLORHEXIDINE GLUCONATE 4 % EX LIQD
60.0000 mL | Freq: Once | CUTANEOUS | Status: AC
  Administered 2015-08-19: 4 via TOPICAL
  Filled 2015-08-18: qty 60

## 2015-08-18 MED ORDER — ALUM & MAG HYDROXIDE-SIMETH 200-200-20 MG/5ML PO SUSP: 30.0000 mL | Freq: Four times a day (QID) | ORAL | Status: DC | PRN

## 2015-08-18 MED ORDER — ONDANSETRON HCL 4 MG PO TABS: 4.0000 mg | ORAL_TABLET | Freq: Four times a day (QID) | ORAL | Status: DC | PRN

## 2015-08-18 MED ORDER — CEFAZOLIN SODIUM-DEXTROSE 2-3 GM-% IV SOLR
2.0000 g | INTRAVENOUS | Status: AC
Start: 2015-08-19 — End: 2015-08-19
  Administered 2015-08-19: 2 g via INTRAVENOUS

## 2015-08-18 MED ORDER — ACETAMINOPHEN 650 MG RE SUPP: 650.0000 mg | Freq: Four times a day (QID) | RECTAL | Status: DC | PRN

## 2015-08-18 MED ORDER — OXYCODONE HCL 5 MG PO TABS: 5.0000 mg | ORAL_TABLET | ORAL | Status: DC | PRN

## 2015-08-18 MED ORDER — ACETAMINOPHEN 325 MG PO TABS
650.0000 mg | ORAL_TABLET | Freq: Four times a day (QID) | ORAL | Status: DC | PRN
  Administered 2015-08-19 – 2015-08-21 (×3): 650 mg via ORAL
  Filled 2015-08-18 (×3): qty 2

## 2015-08-18 MED ORDER — HYDROMORPHONE HCL 1 MG/ML IJ SOLN
0.5000 mg | INTRAMUSCULAR | Status: DC | PRN
  Administered 2015-08-18 – 2015-08-19 (×6): 1 mg via INTRAVENOUS
  Filled 2015-08-18 (×6): qty 1

## 2015-08-18 MED ORDER — ONDANSETRON HCL 4 MG/2ML IJ SOLN: 4.0000 mg | Freq: Four times a day (QID) | INTRAMUSCULAR | Status: DC | PRN

## 2015-08-18 MED ORDER — SODIUM CHLORIDE 0.9 % IV SOLN
INTRAVENOUS | Status: DC
  Administered 2015-08-18 – 2015-08-19 (×3): via INTRAVENOUS

## 2015-08-18 NOTE — Consult Note (Signed)
Reason for Consult:74 year old female who fell and suffered a left intertrochanteric hip fracture Referring Physician: hospitalists  [name] is an 74 y.o. female.  HPI: the patient is a 74 year old female who fell earlier today.  She suffered a left intertrochanteric hip fracture.  She will be admitted by the internal medicine service and cleared for surgery.  She has not complained of any previous hip pain.  She is a household ambulator currently.  Past Medical History  Diagnosis Date  . Insomnia   . Hypertension     History reviewed. No pertinent past surgical history.  Family History  Problem Relation Age of Onset  . Congestive Heart Failure Son 85    Social History:  reports that she has been smoking Cigarettes.  She does not have any smokeless tobacco history on file. She reports that she does not drink alcohol or use illicit drugs.  Allergies: No Known Allergies  Medications: I have reviewed the patient's current medications.  Results for orders placed or performed during the hospital encounter of 08/18/15 (from the past 48 hour(s))  Type and screen [facility]     Status: None (Preliminary result)   Collection Time: 08/18/15  9:51 PM  Result Value Ref Range   ABO/RH(D) O POS    Antibody Screen PENDING    Sample Expiration 08/21/2015   CBC WITH DIFFERENTIAL     Status: Abnormal   Collection Time: 08/18/15 10:09 PM  Result Value Ref Range   WBC 8.6 4.0 - 10.5 K/uL   RBC 3.47 (L) 3.87 - 5.11 MIL/uL   Hemoglobin 10.9 (L) 12.0 - 15.0 g/dL   HCT 16.1 (L) 09.6 - 04.5 %   MCV 94.8 78.0 - 100.0 fL   MCH 31.4 26.0 - 34.0 pg   MCHC 33.1 30.0 - 36.0 g/dL   RDW 40.9 81.1 - 91.4 %   Platelets 265 150 - 400 K/uL   Neutrophils Relative % 79 %   Neutro Abs 6.8 1.7 - 7.7 K/uL   Lymphocytes Relative 15 %   Lymphs Abs 1.3 0.7 - 4.0 K/uL   Monocytes Relative 4 %   Monocytes Absolute 0.3 0.1 - 1.0 K/uL   Eosinophils Relative 2 %   Eosinophils Absolute  0.2 0.0 - 0.7 K/uL   Basophils Relative 0 %   Basophils Absolute 0.0 0.0 - 0.1 K/uL    Dg Chest 1 View  08/18/2015  CLINICAL DATA:  Status post fall, with concern for chest injury. Initial encounter. EXAM: CHEST 1 VIEW COMPARISON:  Chest radiograph performed 05/02/2015 FINDINGS: The lungs are well-aerated and clear. There is no evidence of focal opacification, pleural effusion or pneumothorax. The cardiomediastinal silhouette is within normal limits. No acute osseous abnormalities are seen. Lateral osteophytes are noted along the thoracic spine. IMPRESSION: No acute cardiopulmonary process seen. No displaced rib fractures identified. Electronically Signed   By: Roanna Raider M.D.   On: 08/18/2015 21:40   Dg Hip Unilat With Pelvis 2-3 Views Left  08/18/2015  CLINICAL DATA:  Fall with left hip fracture. EXAM: DG HIP (WITH OR WITHOUT PELVIS) 2-3V LEFT COMPARISON:  None. FINDINGS: Exam demonstrates moderate degenerative changes of the hips. There is mild diffuse decreased bone mineralization. There is a moderately displaced trochanteric fracture of the left femoral neck. Displaced lesser trochanter. Posterior angulation of the distal fragment with mild impaction. Possible nondisplaced fracture of the left inferior pubic ramus. There are degenerative changes of the spine. IMPRESSION: Moderately displaced intertrochanteric fracture left hip. Possible  fracture of the left inferior pubic ramus. Electronically Signed   By: Elberta Fortisaniel  Boyle M.D.   On: 08/18/2015 21:41    ROS  ROS: I have reviewed the patient's review of systems thoroughly and there are no positive responses as relates to the HPI. EXAM:  Blood pressure 101/68, pulse 100, temperature 98.7 F (37.1 C), temperature source Oral, resp. rate 23, height 5' 5.5" (1.664 m), weight 45.36 kg (100 lb), SpO2 95 %. Physical Exam Well-developed well-nourished patient in no acute distress. Alert and oriented x3 HEENT:within normal limits Cardiac: Regular  rate and rhythm Pulmonary: Lungs clear to auscultation Abdomen: Soft and nontender.  Normal active bowel sounds  Musculoskeletal: (left hip: Excellent rotated and shortened.  Pain with all range of motion.  Neurovascularly intact distally. Assessment/Plan: 74 year old female community ambulator who fell earlier today.  She is complaining of left hip pain and has a left intertrochanteric hip fracture.  She will need clearance via the internal medicine service and once cleared will be taken to the operating room for open reduction internal fixation of left hip fracture.  I had a long discussion today with her son describing the procedure in detail and he is aware of the risks and benefits and wishes to proceed.I have spoken with the operating room and they're available to do her surgery at 9:30 tomorrow morning if she is cleared via the internal medicine service which we anticipate. I have had a prolonged discussion with the patient regarding the risk and benefits of the surgical procedure.  The patient understands the risks include but are not limited to bleeding, infection and failure of the surgery to cure the problem and need for further surgery.  The patient understands there is a slight risk of death at the time of surgery.  The patient understands these risks along with the potential benefits and wishes to proceed with surgical intervention.  The patient will be followed in the office in the postoperative period.  Philana Younis L 08/18/2015, 10:33 PM

## 2015-08-18 NOTE — ED Provider Notes (Signed)
CSN: [id number]     Arrival date & time 08/18/15  2048 History   First MD Initiated Contact with Patient 08/18/15 2054     No chief complaint on file.    (Consider location/radiation/quality/duration/timing/severity/associated sxs/prior Treatment) HPI   74 year old female who presents today after fall with left hip pain. She presents via EMS from home. They state that she has severe tenderness of the left hip. She received fentanyl 150 echograms prior to arrival. She states that she slipped and fell and denies loss of consciousness. She states she has had several falls recently. She had a stroke last summer. She denies lateralized deficits from this. She denies being on anticoagulants. She denies chest pain, head injury, neck pain, or other injury besides the hip pain.  Past Medical History  Diagnosis Date  . Insomnia   . Hypertension   No past surgical history on file. Family History  Problem Relation Age of Onset  . Congestive Heart Failure Son 55  Principal Problem:   Stroke Active Problems:   CVA (cerebral infarction)   Acute systolic congestive heart failure, NYHA class 3   COPD exacerbation   Tobacco abuse   Severe protein-calorie malnutrition   HTN (hypertension)   Congestive dilated cardiomyopathy   Secondary cardiomyopathy   Thyroid nodule noted on CTA of head and neck   Severe spinal stenosis noted on CTA of head and neck   Pulmonary nodules noted on chest x-[facility] V[facility]man with follow-up chest x-Steve Youngberg recommended in 4 weeks for further evaluation.  BMI: Body mass index is 15.52 kg/(m^2).   Social History  Substance Use Topics  . Smoking status: Current Every Day Smoker    Types: Cigarettes  . Smokeless tobacco: Not on file  . Alcohol Use: No   OB History    No data available     Review of Systems  All other systems reviewed and are negative.     Allergies  Review of patient's allergies indicates no known allergies.  Home Medications   Prior to Admission  medications   Medication Sig Start Date End Date Taking? Authorizing Provider  acetaminophen (TYLENOL) 325 MG tablet Take 2 tablets (650 mg total) by mouth every 6 (six) hours as needed for mild pain (temp >/= 99.5). 03/04/15   David L Rinehuls, PA-C  albuterol (PROVENTIL) (2.5 MG/3ML) 0.083% nebulizer solution Take 3 mLs (2.5 mg total) by nebulization every 4 (four) hours as needed for wheezing or shortness of breath. 03/04/15   Kinnie Scales Rinehuls, PA-C  aspirin 325 MG tablet Take 1 tablet (325 mg total) by mouth daily. 03/04/15   David L Rinehuls, PA-C  carvedilol (COREG) 3.125 MG tablet Take 1 tablet (3.125 mg total) by mouth 2 (two) times daily with a meal. 03/04/15   David L Rinehuls, PA-C  feeding supplement, RESOURCE BREEZE, (RESOURCE BREEZE) LIQD Take 1 Container by mouth 2 (two) times daily between meals. 03/04/15   David L Rinehuls, PA-C  lisinopril (PRINIVIL,ZESTRIL) 2.5 MG tablet Take 1 tablet (2.5 mg total) by mouth daily. 03/04/15   David L Rinehuls, PA-C  Melatonin 1 MG CAPS Take 1 capsule by mouth at bedtime.    Historical Provider, MD  pantoprazole (PROTONIX) 40 MG tablet Take 1 tablet (40 mg total) by mouth daily. 03/04/15   David L Rinehuls, PA-C  senna-docusate (SENOKOT-S) 8.6-50 MG per tablet Take 1 tablet by mouth at bedtime as needed for mild constipation. 03/04/15   David L Rinehuls, PA-C   There were no vitals taken for this  visit. Physical Exam  Constitutional: She appears well-developed and well-nourished.  HENT:  Head: Normocephalic and atraumatic.  Mouth/Throat: Oropharynx is clear and moist.  adentelous  Eyes: Conjunctivae are normal. Pupils are equal, round, and reactive to light.  Neck: Normal range of motion. Neck supple.  Cardiovascular: Normal rate and regular rhythm.   Pulmonary/Chest: Effort normal and breath sounds normal.  Abdominal: Soft. Bowel sounds are normal.  Musculoskeletal:       Legs: Patient is laying on right side with left hip partially flexed. She  is tender to palpation over lateral aspect of left hip. No obvious shortening is noted. No external signs of trauma are noted. Skin is closed. Dorsal pedalis pulse is intact.  Nursing note and vitals reviewed.   ED Course  Procedures (including critical care time) Labs Review Labs Reviewed  URINE CULTURE  APTT  COMPREHENSIVE METABOLIC PANEL  CBC WITH DIFFERENTIAL/PLATELET  PROTIME-INR  URINALYSIS, ROUTINE W REFLEX MICROSCOPIC (NOT AT [facility])  TYPE AND SCREEN    Imaging Review Dg Chest 1 View  08/18/2015  CLINICAL DATA:  Status post fall, with concern for chest injury. Initial encounter. EXAM: CHEST 1 VIEW COMPARISON:  Chest radiograph performed 05/02/2015 FINDINGS: The lungs are well-aerated and clear. There is no evidence of focal opacification, pleural effusion or pneumothorax. The cardiomediastinal silhouette is within normal limits. No acute osseous abnormalities are seen. Lateral osteophytes are noted along the thoracic spine. IMPRESSION: No acute cardiopulmonary process seen. No displaced rib fractures identified. Electronically Signed   By: Roanna Raider M.D.   On: 08/18/2015 21:40   Dg Hip Unilat With Pelvis 2-3 Views Left  08/18/2015  CLINICAL DATA:  Fall with left hip fracture. EXAM: DG HIP (WITH OR WITHOUT PELVIS) 2-3V LEFT COMPARISON:  None. FINDINGS: Exam demonstrates moderate degenerative changes of the hips. There is mild diffuse decreased bone mineralization. There is a moderately displaced trochanteric fracture of the left femoral neck. Displaced lesser trochanter. Posterior angulation of the distal fragment with mild impaction. Possible nondisplaced fracture of the left inferior pubic ramus. There are degenerative changes of the spine. IMPRESSION: Moderately displaced intertrochanteric fracture left hip. Possible fracture of the left inferior pubic ramus. Electronically Signed   By: Elberta Fortis M.D.   On: 08/18/2015 21:41   I have personally reviewed and evaluated these  images and lab results as part of my medical decision-making.   EKG Interpretation   Date/Time:  Saturday August 18 2015 21:04:56 EST Ventricular Rate:  97 PR Interval:  152 QRS Duration: 69 QT Interval:  324 QTC Calculation: 411 R Axis:   68 Text Interpretation:  Sinus rhythm Consider left ventricular hypertrophy  Non-specific ST-t changes Confirmed by Rey Fors MD, Duwayne Heck ([id number]) on  08/18/2015 9:50:28 PM      MDM   Final diagnoses:  Closed left hip fracture, initial encounter [facility])   74 year old female with left intertrochanteric fracture sustained after fall today. By patient family history fall was mechanical and there was no loss of consciousness.  9:59 PM Discussed results with patient and her son  10:38 PM Discussed care with Dr. Jairo Ben he plans oh are in the a.m. She'll be made nothing by mouth after midnight. Patient care discussed with Dr. Della Goo and she will admit to MedSurg. Electrolytes are pending and Dr. Lovell Sheehan is aware.  1- left hip intertroch fx 2- mechanical fall 3- anemia- mild, patient hemodynamically stable 4- ekg changes- nsst, no pain,cw lvh 5- h.o. Thyroid and lung nodules- son states she refused biopsy  last summer but he wants readressed with patient.   Margarita Grizzleanielle Takira Sherrin, MD 08/19/15 [phone]

## 2015-08-18 NOTE — ED Notes (Signed)
Pt brought to ED by [facility] after falling, pt live by her self when to the bathroom and fell, pt denies LOC, pt having pain on her left hip. Unable to assess at this point pt states is to painful to touch or to move.

## 2015-08-18 NOTE — H&P (Signed)
[facility] Admission History and Physical       Tammy [name] ZOX:[id number] DOB: 05/13/1941 DOA: 08/18/2015  Referring physician: EDP PCP: Cala Bradford, MD  Specialists:   Chief Complaint:  Left Hip Pain  HPI: Tammy [name] is a 74 y.o. female with a history of HTN who fell onto her Left side while walking to the bathroom.   She had increased pain in her left hip and was unable to bear weight on her left leg.    In the ED and X-ray revealed a Left Hip Fracture and Orthopedics Dr Luiz Blare was consulted.   She was referred for admission with plans for surgery in AM.       Review of Systems: Constitutional: No Weight Loss, No Weight Gain, Night Sweats, Fevers, Chills, Dizziness, Light Headedness, Fatigue, or Generalized Weakness HEENT: No Headaches, Difficulty Swallowing,Tooth/Dental Problems,Sore Throat,  No Sneezing, Rhinitis, Ear Ache, Nasal Congestion, or Post Nasal Drip,  Cardio-vascular:  No Chest pain, Orthopnea, PND, Edema in Lower Extremities, Anasarca, Dizziness, Palpitations  Resp: No Dyspnea, No DOE, No Productive Cough, No Non-Productive Cough, No Hemoptysis, No Wheezing.    GI: No Heartburn, Indigestion, Abdominal Pain, Nausea, Vomiting, Diarrhea, Constipation, Hematemesis, Hematochezia, Melena, Change in Bowel Habits,  Loss of Appetite  GU: No Dysuria, No Change in Color of Urine, No Urgency or Urinary Frequency, No Flank pain.  Musculoskeletal:  +Left Hip Pain, No Joint Pain or Swelling, No Decreased Range of Motion, No Back Pain.  Neurologic: No Syncope, No Seizures, Muscle Weakness, Paresthesia, Vision Disturbance or Loss, No Diplopia, No Vertigo, No Difficulty Walking,  Skin: No Rash or Lesions. Psych: No Change in Mood or Affect, No Depression or Anxiety, No Memory loss, No Confusion, or Hallucinations   Past Medical History  Diagnosis Date  . Insomnia   . Hypertension      History reviewed. No pertinent past surgical history.    Prior to  Admission medications   Medication Sig Start Date End Date Taking? Authorizing Provider  carvedilol (COREG) 3.125 MG tablet Take 1 tablet (3.125 mg total) by mouth 2 (two) times daily with a meal. 03/04/15  Yes David L Rinehuls, PA-C  diphenhydramine-acetaminophen (TYLENOL PM) 25-500 MG TABS tablet Take 1-2 tablets by mouth at bedtime.   Yes Historical Provider, MD  lisinopril (PRINIVIL,ZESTRIL) 2.5 MG tablet Take 1 tablet (2.5 mg total) by mouth daily. 03/04/15  Yes David L Rinehuls, PA-C  pantoprazole (PROTONIX) 40 MG tablet Take 1 tablet (40 mg total) by mouth daily. 03/04/15  Yes David L Rinehuls, PA-C  senna-docusate (SENOKOT-S) 8.6-50 MG per tablet Take 1 tablet by mouth at bedtime as needed for mild constipation. Patient taking differently: Take 1 tablet by mouth every morning.  03/04/15  Yes David L Rinehuls, PA-C  sertraline (ZOLOFT) 50 MG tablet Take 50 mg by mouth daily. 07/28/15  Yes Historical Provider, MD  acetaminophen (TYLENOL) 325 MG tablet Take 2 tablets (650 mg total) by mouth every 6 (six) hours as needed for mild pain (temp >/= 99.5). 03/04/15   David L Rinehuls, PA-C  albuterol (PROVENTIL) (2.5 MG/3ML) 0.083% nebulizer solution Take 3 mLs (2.5 mg total) by nebulization every 4 (four) hours as needed for wheezing or shortness of breath. 03/04/15   Kinnie Scales Rinehuls, PA-C  aspirin 325 MG tablet Take 1 tablet (325 mg total) by mouth daily. 03/04/15   David L Rinehuls, PA-C  feeding supplement, RESOURCE BREEZE, (RESOURCE BREEZE) LIQD Take 1 Container by mouth 2 (two) times daily between meals.  03/04/15   David L Rinehuls, PA-C     No Known Allergies    Social History:  reports that she has been smoking Cigarettes.  She does not have any smokeless tobacco history on file. She reports that she does not drink alcohol or use illicit drugs.     Family History  Problem Relation Age of Onset  . Congestive Heart Failure Son 55       Physical Exam:  GEN:  Cachectic Elderly  74 y.o.  African American female examined and in no acute distress; cooperative with exam Filed Vitals:   08/18/15 2111 08/18/15 2112 08/18/15 2200  BP: 126/63  101/68  Pulse: 97  100  Temp: 98.7 F (37.1 C)    TempSrc: Oral    Resp: 18  23  Height:  5' 5.5" (1.664 m)   Weight:  45.36 kg (100 lb)   SpO2: 100%  95%   Blood pressure 101/68, pulse 100, temperature 98.7 F (37.1 C), temperature source Oral, resp. rate 23, height 5' 5.5" (1.664 m), weight 45.36 kg (100 lb), SpO2 95 %. PSYCH: She is alert and oriented x4; does not appear anxious does not appear depressed; affect is normal HEENT: Normocephalic and Atraumatic, Mucous membranes pink; PERRLA; EOM intact; Fundi:  Benign;  No scleral icterus, Nares: Patent, Oropharynx: Clear, Edentulous,    Neck:  FROM, No Cervical Lymphadenopathy nor Thyromegaly or Carotid Bruit; No JVD; Breasts:: Not examined CHEST WALL: No tenderness CHEST: Normal respiration, clear to auscultation bilaterally HEART: Regular rate and rhythm; no murmurs rubs or gallops BACK: No kyphosis or scoliosis; No CVA tenderness ABDOMEN: Positive Bowel Sounds, Scaphoid, Soft Non-Tender, No Rebound or Guarding; No Masses, No Organomegaly Rectal Exam: Not done EXTREMITIES: No Cyanosis, Clubbing, or Edema; No Ulcerations. Genitalia: not examined PULSES: 2+ and symmetric SKIN: Normal hydration no rash or ulceration CNS:  Alert and Oriented x 4, No Focal Deficits Vascular: pulses palpable throughout    Labs on Admission:  Basic Metabolic Panel:  Recent Labs Lab 08/18/15 2209  NA 132*  K 4.9  CL 99*  CO2 25  GLUCOSE 135*  BUN 27*  CREATININE 1.02*  CALCIUM 9.3   Liver Function Tests:  Recent Labs Lab 08/18/15 2209  AST 29  ALT 20  ALKPHOS 58  BILITOT 0.3  PROT 7.0  ALBUMIN 3.9   No results for input(s): LIPASE, AMYLASE in the last 168 hours. No results for input(s): AMMONIA in the last 168 hours. CBC:  Recent Labs Lab 08/18/15 2209  WBC 8.6  NEUTROABS  6.8  HGB 10.9*  HCT 32.9*  MCV 94.8  PLT 265   Cardiac Enzymes: No results for input(s): CKTOTAL, CKMB, CKMBINDEX, TROPONINI in the last 168 hours.  BNP (last 3 results) No results for input(s): BNP in the last 8760 hours.  ProBNP (last 3 results) No results for input(s): PROBNP in the last 8760 hours.  CBG: No results for input(s): GLUCAP in the last 168 hours.  Radiological Exams on Admission: Dg Chest 1 View  08/18/2015  CLINICAL DATA:  Status post fall, with concern for chest injury. Initial encounter. EXAM: CHEST 1 VIEW COMPARISON:  Chest radiograph performed 05/02/2015 FINDINGS: The lungs are well-aerated and clear. There is no evidence of focal opacification, pleural effusion or pneumothorax. The cardiomediastinal silhouette is within normal limits. No acute osseous abnormalities are seen. Lateral osteophytes are noted along the thoracic spine. IMPRESSION: No acute cardiopulmonary process seen. No displaced rib fractures identified. Electronically Signed   By: Leotis ShamesJeffery  Chang M.D.   On: 08/18/2015 21:40   Dg Hip Unilat With Pelvis 2-3 Views Left  08/18/2015  CLINICAL DATA:  Fall with left hip fracture. EXAM: DG HIP (WITH OR WITHOUT PELVIS) 2-3V LEFT COMPARISON:  None. FINDINGS: Exam demonstrates moderate degenerative changes of the hips. There is mild diffuse decreased bone mineralization. There is a moderately displaced trochanteric fracture of the left femoral neck. Displaced lesser trochanter. Posterior angulation of the distal fragment with mild impaction. Possible nondisplaced fracture of the left inferior pubic ramus. There are degenerative changes of the spine. IMPRESSION: Moderately displaced intertrochanteric fracture left hip. Possible fracture of the left inferior pubic ramus. Electronically Signed   By: Elberta Fortis M.D.   On: 08/18/2015 21:41     EKG: Independently reviewed. Normal Sinus Rhythm 97 LVH changes   Assessment/Plan:      74 y.o. female with  Active  Problems:   1.     Closed left hip fracture (HCC)   Ortho to see  Dr. Luiz Blare, Plan for Surgery in AM   NPO after Midnight tonight   Pain control PRN with IV Dilaudid     2.     Essential HTN   PRN IV Hydralazine      3.     DVT Prophylaxis   SCDs   Code Status:     FULL CODE        Family Communication:   No Family Present    Disposition Plan:    Inpatient Status      Time spent:  23 Minutes      Ron Parker [facility] Pager [phone]   If 7AM -7PM Please Contact the Day Rounding Team MD for [facility]  If 7PM-7AM, Please Contact Night-Floor Coverage  [email or web address] Password [email or web address] 08/18/2015, 10:50 PM     ADDENDUM:   Patient was seen and examined on 08/18/2015

## 2015-08-18 NOTE — ED Notes (Signed)
Patient transported to X-ray 

## 2015-08-18 NOTE — ED Notes (Signed)
Pt refusing to be touch or move, unable to place urine catheter and get urine at this time. MD notified.

## 2015-08-19 ENCOUNTER — Inpatient Hospital Stay (HOSPITAL_COMMUNITY): Payer: Medicare Other

## 2015-08-19 ENCOUNTER — Encounter (HOSPITAL_COMMUNITY)
Admission: EM | Disposition: A | Discharge status: Skilled Nursing Facility | Payer: Self-pay | Source: Home / Self Care | Attending: Internal Medicine

## 2015-08-19 ENCOUNTER — Inpatient Hospital Stay (HOSPITAL_COMMUNITY): Payer: Medicare Other | Admitting: Anesthesiology

## 2015-08-19 DIAGNOSIS — S72002A Fracture of unspecified part of neck of left femur, initial encounter for closed fracture: Secondary | ICD-10-CM

## 2015-08-19 DIAGNOSIS — S72142A Displaced intertrochanteric fracture of left femur, initial encounter for closed fracture: Secondary | ICD-10-CM | POA: Diagnosis present

## 2015-08-19 HISTORY — PX: INTRAMEDULLARY (IM) NAIL INTERTROCHANTERIC: SHX5875

## 2015-08-19 LAB — BASIC METABOLIC PANEL
Anion gap: 9 (ref 5–15)
BUN: 32 mg/dL — ABNORMAL HIGH (ref 6–20)
CO2: 23 mmol/L (ref 22–32)
Calcium: 9.5 mg/dL (ref 8.9–10.3)
Chloride: 101 mmol/L (ref 101–111)
Creatinine, Ser: 1.07 mg/dL — ABNORMAL HIGH (ref 0.44–1.00)
GFR calc Af Amer: 57 mL/min — ABNORMAL LOW (ref >60)
GFR calc non Af Amer: 49 mL/min — ABNORMAL LOW (ref >60)
Glucose, Bld: 169 mg/dL — ABNORMAL HIGH (ref 65–99)
Potassium: 5.3 mmol/L — ABNORMAL HIGH (ref 3.5–5.1)
Sodium: 133 mmol/L — ABNORMAL LOW (ref 135–145)

## 2015-08-19 LAB — URINALYSIS, ROUTINE W REFLEX MICROSCOPIC
Bilirubin Urine: NEGATIVE
Glucose, UA: NEGATIVE mg/dL
Hgb urine dipstick: NEGATIVE
Ketones, ur: NEGATIVE mg/dL
Leukocytes, UA: NEGATIVE
Nitrite: NEGATIVE
Protein, ur: NEGATIVE mg/dL
Specific Gravity, Urine: 1.022 (ref 1.005–1.030)
Urobilinogen, UA: 0.2 mg/dL (ref 0.0–1.0)
pH: 7.5 (ref 5.0–8.0)

## 2015-08-19 LAB — CBC
HCT: 32.6 % — ABNORMAL LOW (ref 36.0–46.0)
Hemoglobin: 10.7 g/dL — ABNORMAL LOW (ref 12.0–15.0)
MCH: 31.2 pg (ref 26.0–34.0)
MCHC: 32.8 g/dL (ref 30.0–36.0)
MCV: 95 fL (ref 78.0–100.0)
Platelets: 275 10*3/uL (ref 150–400)
RBC: 3.43 MIL/uL — ABNORMAL LOW (ref 3.87–5.11)
RDW: 13.1 % (ref 11.5–15.5)
WBC: 7.8 10*3/uL (ref 4.0–10.5)

## 2015-08-19 LAB — MRSA PCR SCREENING: MRSA by PCR: NEGATIVE

## 2015-08-19 LAB — ABO/RH: ABO/RH(D): O POS

## 2015-08-19 SURGERY — FIXATION, FRACTURE, INTERTROCHANTERIC, WITH INTRAMEDULLARY ROD
Anesthesia: General | Site: Hip | Laterality: Left

## 2015-08-19 MED ORDER — MEPERIDINE HCL 25 MG/ML IJ SOLN: 6.2500 mg | INTRAMUSCULAR | Status: DC | PRN

## 2015-08-19 MED ORDER — ONDANSETRON HCL 4 MG/2ML IJ SOLN: 4.0000 mg | Freq: Once | INTRAMUSCULAR | Status: DC | PRN

## 2015-08-19 MED ORDER — FENTANYL CITRATE (PF) 250 MCG/5ML IJ SOLN
INTRAMUSCULAR | Status: DC | PRN
  Administered 2015-08-19 (×3): 50 ug via INTRAVENOUS
  Administered 2015-08-19: 100 ug via INTRAVENOUS

## 2015-08-19 MED ORDER — CARVEDILOL 6.25 MG PO TABS
6.2500 mg | ORAL_TABLET | Freq: Two times a day (BID) | ORAL | Status: DC
  Administered 2015-08-19 – 2015-08-22 (×6): 6.25 mg via ORAL
  Filled 2015-08-19 (×6): qty 1

## 2015-08-19 MED ORDER — CEFAZOLIN SODIUM-DEXTROSE 2-3 GM-% IV SOLR
INTRAVENOUS | Status: AC
  Filled 2015-08-19: qty 50

## 2015-08-19 MED ORDER — METHOCARBAMOL 500 MG PO TABS
500.0000 mg | ORAL_TABLET | Freq: Four times a day (QID) | ORAL | Status: DC | PRN
  Administered 2015-08-19: 500 mg via ORAL
  Filled 2015-08-19: qty 1

## 2015-08-19 MED ORDER — SERTRALINE HCL 50 MG PO TABS
50.0000 mg | ORAL_TABLET | Freq: Every day | ORAL | Status: DC
  Administered 2015-08-19 – 2015-08-21 (×3): 50 mg via ORAL
  Filled 2015-08-19 (×3): qty 1

## 2015-08-19 MED ORDER — 0.9 % SODIUM CHLORIDE (POUR BTL) OPTIME
TOPICAL | Status: DC | PRN
  Administered 2015-08-19: 1000 mL

## 2015-08-19 MED ORDER — DOCUSATE SODIUM 100 MG PO CAPS
100.0000 mg | ORAL_CAPSULE | Freq: Two times a day (BID) | ORAL | Status: DC
Start: 2015-08-19 — End: 2015-08-22
  Administered 2015-08-19 – 2015-08-21 (×5): 100 mg via ORAL
  Filled 2015-08-19 (×5): qty 1

## 2015-08-19 MED ORDER — ASPIRIN EC 325 MG PO TBEC: 325.0000 mg | DELAYED_RELEASE_TABLET | Freq: Two times a day (BID) | ORAL | Status: DC

## 2015-08-19 MED ORDER — CEFAZOLIN SODIUM-DEXTROSE 2-3 GM-% IV SOLR
2.0000 g | Freq: Four times a day (QID) | INTRAVENOUS | Status: AC
  Administered 2015-08-19 – 2015-08-20 (×2): 2 g via INTRAVENOUS
  Filled 2015-08-19 (×2): qty 50

## 2015-08-19 MED ORDER — SUCCINYLCHOLINE CHLORIDE 20 MG/ML IJ SOLN
INTRAMUSCULAR | Status: DC | PRN
  Administered 2015-08-19: 140 mg via INTRAVENOUS

## 2015-08-19 MED ORDER — HYDROMORPHONE HCL 1 MG/ML IJ SOLN: 0.2500 mg | INTRAMUSCULAR | Status: DC | PRN

## 2015-08-19 MED ORDER — ONDANSETRON HCL 4 MG/2ML IJ SOLN
INTRAMUSCULAR | Status: AC
  Filled 2015-08-19: qty 2

## 2015-08-19 MED ORDER — PHENYLEPHRINE HCL 10 MG/ML IJ SOLN
10.0000 mg | INTRAVENOUS | Status: DC | PRN
  Administered 2015-08-19: 60 ug/min via INTRAVENOUS

## 2015-08-19 MED ORDER — ASPIRIN EC 325 MG PO TBEC
325.0000 mg | DELAYED_RELEASE_TABLET | Freq: Two times a day (BID) | ORAL | Status: DC
Start: 2015-08-19 — End: 2015-08-22
  Administered 2015-08-19 – 2015-08-22 (×6): 325 mg via ORAL
  Filled 2015-08-19 (×6): qty 1

## 2015-08-19 MED ORDER — PROPOFOL 10 MG/ML IV BOLUS
INTRAVENOUS | Status: DC | PRN
Start: 2015-08-19 — End: 2015-08-20
  Administered 2015-08-19: 20 mg via INTRAVENOUS
  Administered 2015-08-19: 150 mg via INTRAVENOUS

## 2015-08-19 MED ORDER — POLYETHYLENE GLYCOL 3350 17 G PO PACK: 17.0000 g | PACK | Freq: Every day | ORAL | Status: DC | PRN

## 2015-08-19 MED ORDER — FERROUS SULFATE 325 (65 FE) MG PO TABS
325.0000 mg | ORAL_TABLET | Freq: Two times a day (BID) | ORAL | Status: DC
  Administered 2015-08-19 – 2015-08-22 (×6): 325 mg via ORAL
  Filled 2015-08-19 (×6): qty 1

## 2015-08-19 MED ORDER — ONDANSETRON HCL 4 MG/2ML IJ SOLN
INTRAMUSCULAR | Status: DC | PRN
  Administered 2015-08-19: 4 mg via INTRAVENOUS

## 2015-08-19 MED ORDER — CARVEDILOL 3.125 MG PO TABS: 3.1250 mg | ORAL_TABLET | Freq: Two times a day (BID) | ORAL | Status: DC

## 2015-08-19 MED ORDER — LIDOCAINE HCL (CARDIAC) 20 MG/ML IV SOLN
INTRAVENOUS | Status: DC | PRN
  Administered 2015-08-19: 100 mg via INTRAVENOUS

## 2015-08-19 MED ORDER — LISINOPRIL 2.5 MG PO TABS
2.5000 mg | ORAL_TABLET | Freq: Every day | ORAL | Status: DC
  Administered 2015-08-19 – 2015-08-21 (×3): 2.5 mg via ORAL
  Filled 2015-08-19 (×3): qty 1

## 2015-08-19 MED ORDER — BISACODYL 5 MG PO TBEC: 5.0000 mg | DELAYED_RELEASE_TABLET | Freq: Every day | ORAL | Status: DC | PRN

## 2015-08-19 MED ORDER — HYDROCODONE-ACETAMINOPHEN 5-325 MG PO TABS: 1.0000 | ORAL_TABLET | Freq: Four times a day (QID) | ORAL | Status: DC | PRN

## 2015-08-19 MED ORDER — METHOCARBAMOL 1000 MG/10ML IJ SOLN
500.0000 mg | Freq: Four times a day (QID) | INTRAVENOUS | Status: DC | PRN
  Filled 2015-08-19: qty 5

## 2015-08-19 MED ORDER — PHENYLEPHRINE 40 MCG/ML (10ML) SYRINGE FOR IV PUSH (FOR BLOOD PRESSURE SUPPORT)
PREFILLED_SYRINGE | INTRAVENOUS | Status: AC
  Filled 2015-08-19: qty 10

## 2015-08-19 MED ORDER — EPHEDRINE SULFATE 50 MG/ML IJ SOLN
INTRAMUSCULAR | Status: DC | PRN
  Administered 2015-08-19: 5 mg via INTRAVENOUS
  Administered 2015-08-19: 10 mg via INTRAVENOUS

## 2015-08-19 MED ORDER — PHENYLEPHRINE HCL 10 MG/ML IJ SOLN
INTRAMUSCULAR | Status: DC | PRN
  Administered 2015-08-19 (×2): 120 ug via INTRAVENOUS
  Administered 2015-08-19: 160 ug via INTRAVENOUS
  Administered 2015-08-19: 40 ug via INTRAVENOUS
  Administered 2015-08-19 (×2): 120 ug via INTRAVENOUS

## 2015-08-19 MED ORDER — NICOTINE 21 MG/24HR TD PT24
21.0000 mg | MEDICATED_PATCH | Freq: Every day | TRANSDERMAL | Status: DC
  Administered 2015-08-20: 21 mg via TRANSDERMAL
  Filled 2015-08-19 (×3): qty 1

## 2015-08-19 MED ORDER — BOOST / RESOURCE BREEZE PO LIQD: 1.0000 | Freq: Two times a day (BID) | ORAL | Status: DC

## 2015-08-19 MED ORDER — MAGNESIUM CITRATE PO SOLN: 1.0000 | Freq: Once | ORAL | Status: DC | PRN

## 2015-08-19 MED ORDER — ALBUTEROL SULFATE (2.5 MG/3ML) 0.083% IN NEBU: 2.5000 mg | INHALATION_SOLUTION | RESPIRATORY_TRACT | Status: DC | PRN

## 2015-08-19 MED ORDER — PANTOPRAZOLE SODIUM 40 MG PO TBEC
40.0000 mg | DELAYED_RELEASE_TABLET | Freq: Every day | ORAL | Status: DC
  Administered 2015-08-19 – 2015-08-21 (×3): 40 mg via ORAL
  Filled 2015-08-19 (×3): qty 1

## 2015-08-19 MED ORDER — FENTANYL CITRATE (PF) 250 MCG/5ML IJ SOLN
INTRAMUSCULAR | Status: AC
  Filled 2015-08-19: qty 5

## 2015-08-19 SURGICAL SUPPLY — 48 items
BIT DRILL 4.3MMS DISTAL GRDTED (BIT) ×1 IMPLANT
BLADE SURG ROTATE 9660 (MISCELLANEOUS) IMPLANT
COVER MAYO STAND STRL (DRAPES) ×2 IMPLANT
COVER PERINEAL POST (MISCELLANEOUS) ×2 IMPLANT
COVER SURGICAL LIGHT HANDLE (MISCELLANEOUS) ×6 IMPLANT
DECANTER SPIKE VIAL GLASS SM (MISCELLANEOUS) ×1 IMPLANT
DRAPE STERI IOBAN 125X83 (DRAPES) IMPLANT
DRILL 4.3MMS DISTAL GRADUATED (BIT) ×2
DRSG MEPILEX BORDER 4X4 (GAUZE/BANDAGES/DRESSINGS) ×2 IMPLANT
DRSG MEPILEX BORDER 4X8 (GAUZE/BANDAGES/DRESSINGS) ×1 IMPLANT
DURAPREP 26ML APPLICATOR (WOUND CARE) ×2 IMPLANT
ELECT CAUTERY BLADE 6.4 (BLADE) ×1 IMPLANT
ELECT REM PT RETURN 9FT ADLT (ELECTROSURGICAL) ×2
ELECTRODE REM PT RTRN 9FT ADLT (ELECTROSURGICAL) ×1 IMPLANT
EVACUATOR 1/8 PVC DRAIN (DRAIN) IMPLANT
GAUZE XEROFORM 5X9 LF (GAUZE/BANDAGES/DRESSINGS) ×1 IMPLANT
GLOVE BIO SURGEON STRL SZ7 (GLOVE) ×2 IMPLANT
GLOVE BIOGEL M STER SZ 6 (GLOVE) ×1 IMPLANT
GLOVE BIOGEL PI IND STRL 6.5 (GLOVE) IMPLANT
GLOVE BIOGEL PI IND STRL 8 (GLOVE) ×2 IMPLANT
GLOVE BIOGEL PI INDICATOR 6.5 (GLOVE) ×1
GLOVE BIOGEL PI INDICATOR 8 (GLOVE) ×2
GLOVE ECLIPSE 7.5 STRL STRAW (GLOVE) ×5 IMPLANT
GOWN STRL REUS W/ TWL LRG LVL3 (GOWN DISPOSABLE) ×1 IMPLANT
GOWN STRL REUS W/ TWL XL LVL3 (GOWN DISPOSABLE) ×2 IMPLANT
GOWN STRL REUS W/TWL LRG LVL3 (GOWN DISPOSABLE) ×2
GOWN STRL REUS W/TWL XL LVL3 (GOWN DISPOSABLE) ×4
GUIDEWIRE BALL NOSE 100CM (WIRE) ×2 IMPLANT
HFN LAG SCREW 10.5MM X 85MM (Screw) ×1 IMPLANT
HIP FRAC NAIL LEFT 11X360MM (Orthopedic Implant) ×2 IMPLANT
KIT BASIN OR (CUSTOM PROCEDURE TRAY) ×2 IMPLANT
KIT ROOM TURNOVER OR (KITS) ×2 IMPLANT
LINER BOOT UNIVERSAL DISP (MISCELLANEOUS) ×1 IMPLANT
MANIFOLD NEPTUNE II (INSTRUMENTS) ×1 IMPLANT
NAIL HIP FRAC LEFT 11X360MM (Orthopedic Implant) IMPLANT
NS IRRIG 1000ML POUR BTL (IV SOLUTION) ×2 IMPLANT
PACK GENERAL/GYN (CUSTOM PROCEDURE TRAY) ×2 IMPLANT
PAD ARMBOARD 7.5X6 YLW CONV (MISCELLANEOUS) ×4 IMPLANT
PIN GUIDE 3.2 903003004 (MISCELLANEOUS) ×2 IMPLANT
SCREW BONE CORTICAL 5.0X40 (Screw) ×1 IMPLANT
SCREWDRIVER HEX TIP 3.5MM (MISCELLANEOUS) ×1 IMPLANT
STAPLER VISISTAT 35W (STAPLE) ×2 IMPLANT
SUT VIC AB 0 CTB1 27 (SUTURE) IMPLANT
SUT VIC AB 1 CTB1 27 (SUTURE) ×2 IMPLANT
SUT VIC AB 2-0 CTB1 (SUTURE) IMPLANT
TOWEL OR 17X24 6PK STRL BLUE (TOWEL DISPOSABLE) ×2 IMPLANT
TOWEL OR 17X26 10 PK STRL BLUE (TOWEL DISPOSABLE) ×2 IMPLANT
WATER STERILE IRR 1000ML POUR (IV SOLUTION) IMPLANT

## 2015-08-19 NOTE — Anesthesia Procedure Notes (Signed)
Procedure Name: Intubation Date/Time: 08/19/2015 12:12 PM Performed by: Alanda AmassFRIEDMAN, Maecyn Panning A Pre-anesthesia Checklist: Patient identified, Timeout performed, Emergency Drugs available, Suction available and Patient being monitored Patient Re-evaluated:Patient Re-evaluated prior to inductionOxygen Delivery Method: Circle system utilized Preoxygenation: Pre-oxygenation with 100% oxygen Intubation Type: IV induction Ventilation: Mask ventilation without difficulty Laryngoscope Size: Mac and 3 Grade View: Grade I Tube type: Oral Tube size: 7.5 mm Number of attempts: 1 Airway Equipment and Method: Stylet Placement Confirmation: ETT inserted through vocal cords under direct vision,  breath sounds checked- equal and bilateral and positive ETCO2 Secured at: 22 cm Tube secured with: Tape Dental Injury: Teeth and Oropharynx as per pre-operative assessment

## 2015-08-19 NOTE — Progress Notes (Signed)
Pt refuses to allow staff to place a foley catheter. I will ask the OR nurses to place one when she is sedated for surgery. She is refusing to use a bedpan at this time, stating she does not have to void.

## 2015-08-19 NOTE — Progress Notes (Signed)
[facility]                                                                              Patient Demographics  Tammy [name], is a 74 y.o. female, DOB - 02-Nov-1940, ZOX:[id number]  Admit date - 08/18/2015   Admitting Physician Ron Parker, MD  Outpatient Primary MD for the patient is Cala Bradford, MD  LOS - 1   Chief Complaint  Patient presents with  . Fall       Brief HPI    Tammy T Wakeman is a 74 y.o. female with a history of HTN who fell onto her Left side while walking to the bathroom. She had increased pain in her left hip and was unable to bear weight on her left leg. In the ED and X-ray revealed a Left Hip Fracture and Orthopedics Dr Luiz Blare was consulted. She was referred for admission with plans for surgery.   Assessment & Plan    Principal Problem:   Displaced intertrochanteric fracture of left femur (HCC) - Orthopedics consulted, planned for surgery today - Continue pain control, DVT prophylaxis per orthopedics  Active Problems:   Severe protein-calorie malnutrition (HCC) - Nutrition consult once patient on diet    HTN (hypertension) - Currently stable, continue lisinopril, Coreg  GERD - Continue PPI    Congestive dilated cardiomyopathy (HCC) -Currently compensated, 2-D echo in 6/16 had shown EF of 40-45% with grade 2 diastolic dysfunction - Continue Coreg, lisinopril  - Keep euvolemic state, monitor I's and O's   Code Status: Full CODE STATUS  Family Communication: Discussed in detail with the patient, all imaging results, lab results explained to the patient   Disposition Plan:   Time Spent in minutes   25 minutes  Procedures  None   Consults   Ortho  DVT Prophylaxis   SCD's  Medications  Scheduled Meds: .  ceFAZolin (ANCEF) IV  2 g Intravenous To SS-Surg   Continuous Infusions: . sodium chloride 50 mL/hr at 08/19/15 0545   PRN Meds:.acetaminophen **OR** acetaminophen, alum & mag  hydroxide-simeth, HYDROmorphone (DILAUDID) injection, ondansetron **OR** ondansetron (ZOFRAN) IV, oxyCODONE, oxyCODONE-acetaminophen   Antibiotics   Anti-infectives    Start     Dose/Rate Route Frequency Ordered Stop   08/19/15 0800  ceFAZolin (ANCEF) IVPB 2 g/50 mL premix     2 g 100 mL/hr over 30 Minutes Intravenous To [facility] 08/18/15 2333 08/20/15 0800        Subjective:   Rwanda [name] was seen and examined today.  Pain controlled, no fevers or chills. Patient denies dizziness, chest pain, shortness of breath, abdominal pain, N/V/D/C. No acute events overnight.    Objective:   Blood pressure 126/56, pulse 109, temperature 98.1 F (36.7 C), temperature source Oral, resp. rate 18, height 5' 5.5" (1.664 m), weight 45.36 kg (100 lb), SpO2 97 %.  Wt Readings from Last 3 Encounters:  08/18/15 45.36 kg (100 lb)  03/02/15 44.951 kg (99 lb 1.6 oz)  01/06/15 43.364 kg (95 lb 9.6 oz)     Intake/Output Summary (Last 24 hours) at 08/19/15 1121 Last data filed at 08/19/15 1046  Gross per 24  hour  Intake    590 ml  Output      0 ml  Net    590 ml    Exam  General: Alert and oriented x 3, NAD  HEENT:  PERRLA, EOMI, Anicteric Sclera, mucous membranes moist.   Neck: Supple, no JVD, no masses  CVS: S1 S2 auscultated, no rubs, murmurs or gallops. Regular rate and rhythm.  Respiratory: Clear to auscultation bilaterally, no wheezing, rales or rhonchi  Abdomen: Soft, nontender, nondistended, + bowel sounds  Ext: no cyanosis clubbing or edema  Neuro: able to wiggle her toes, no new deficits   Skin: No rashes  Psych: Normal affect and demeanor, alert and oriented x3    Data Review   Micro Results Recent Results (from the past 240 hour(s))  MRSA PCR Screening     Status: None   Collection Time: 08/19/15  2:47 AM  Result Value Ref Range Status   MRSA by PCR NEGATIVE NEGATIVE Final    Comment:        The GeneXpert MRSA Assay (FDA approved for NASAL  specimens only), is one component of a comprehensive MRSA colonization surveillance program. It is not intended to diagnose MRSA infection nor to guide or monitor treatment for MRSA infections.     Radiology Reports Dg Chest 1 View  08/18/2015  CLINICAL DATA:  Status post fall, with concern for chest injury. Initial encounter. EXAM: CHEST 1 VIEW COMPARISON:  Chest radiograph performed 05/02/2015 FINDINGS: The lungs are well-aerated and clear. There is no evidence of focal opacification, pleural effusion or pneumothorax. The cardiomediastinal silhouette is within normal limits. No acute osseous abnormalities are seen. Lateral osteophytes are noted along the thoracic spine. IMPRESSION: No acute cardiopulmonary process seen. No displaced rib fractures identified. Electronically Signed   By: Roanna Raider M.D.   On: 08/18/2015 21:40   Dg Hip Unilat With Pelvis 2-3 Views Left  08/18/2015  CLINICAL DATA:  Fall with left hip fracture. EXAM: DG HIP (WITH OR WITHOUT PELVIS) 2-3V LEFT COMPARISON:  None. FINDINGS: Exam demonstrates moderate degenerative changes of the hips. There is mild diffuse decreased bone mineralization. There is a moderately displaced trochanteric fracture of the left femoral neck. Displaced lesser trochanter. Posterior angulation of the distal fragment with mild impaction. Possible nondisplaced fracture of the left inferior pubic ramus. There are degenerative changes of the spine. IMPRESSION: Moderately displaced intertrochanteric fracture left hip. Possible fracture of the left inferior pubic ramus. Electronically Signed   By: Elberta Fortis M.D.   On: 08/18/2015 21:41    CBC  Recent Labs Lab 08/18/15 2209 08/19/15 0352  WBC 8.6 7.8  HGB 10.9* 10.7*  HCT 32.9* 32.6*  PLT 265 275  MCV 94.8 95.0  MCH 31.4 31.2  MCHC 33.1 32.8  RDW 13.1 13.1  LYMPHSABS 1.3  --   MONOABS 0.3  --   EOSABS 0.2  --   BASOSABS 0.0  --     Chemistries   Recent Labs Lab 08/18/15 2209  08/19/15 0352  NA 132* 133*  K 4.9 5.3*  CL 99* 101  CO2 25 23  GLUCOSE 135* 169*  BUN 27* 32*  CREATININE 1.02* 1.07*  CALCIUM 9.3 9.5  AST 29  --   ALT 20  --   ALKPHOS 58  --   BILITOT 0.3  --    ------------------------------------------------------------------------------------------------------------------ estimated creatinine clearance is 32.6 mL/min (by C-G formula based on Cr of 1.07). ------------------------------------------------------------------------------------------------------------------ No results for input(s): HGBA1C in the last 72 hours. ------------------------------------------------------------------------------------------------------------------  No results for input(s): CHOL, HDL, LDLCALC, TRIG, CHOLHDL, LDLDIRECT in the last 72 hours. ------------------------------------------------------------------------------------------------------------------ No results for input(s): TSH, T4TOTAL, T3FREE, THYROIDAB in the last 72 hours.  Invalid input(s): FREET3 ------------------------------------------------------------------------------------------------------------------ No results for input(s): VITAMINB12, FOLATE, FERRITIN, TIBC, IRON, RETICCTPCT in the last 72 hours.  Coagulation profile  Recent Labs Lab 08/18/15 2209  INR 1.08    No results for input(s): DDIMER in the last 72 hours.  Cardiac Enzymes No results for input(s): CKMB, TROPONINI, MYOGLOBIN in the last 168 hours.  Invalid input(s): CK ------------------------------------------------------------------------------------------------------------------ Invalid input(s): POCBNP  No results for input(s): GLUCAP in the last 72 hours.   Jabori Henegar M.D. [facility] 08/19/2015, 11:21 AM  Pager: [phone] Between 7am to 7pm - call Pager - [phone]  After 7pm go to [email or web address] - password [email or web address]  Call night coverage person covering after 7pm

## 2015-08-19 NOTE — Progress Notes (Signed)
Orthopedic Tech Progress Note Patient Details:  Tammy FallingVirginia T [name] 1941-02-04 [id number]  Ortho Devices Ortho Device/Splint Location: applied ohf to bed Ortho Device/Splint Interventions: Ordered, Application   Jennye MoccasinHughes, Ligia Duguay Craig 08/19/2015, 8:21 PM

## 2015-08-19 NOTE — Anesthesia Preprocedure Evaluation (Addendum)
Anesthesia Evaluation  Patient identified by MRN, date of birth, ID band Patient awake    Reviewed: Allergy & Precautions, NPO status , Patient's Chart, lab work & pertinent test results  Airway Mallampati: I  TM Distance: >3 FB Neck ROM: Full    Dental  (+) Edentulous Upper, Edentulous Lower, Dental Advisory Given   Pulmonary COPD, Current Smoker,    Pulmonary exam normal        Cardiovascular hypertension, Pt. on medications Normal cardiovascular exam     Neuro/Psych CVA, No Residual Symptoms    GI/Hepatic   Endo/Other    Renal/GU      Musculoskeletal   Abdominal   Peds  Hematology   Anesthesia Other Findings   Reproductive/Obstetrics                           Anesthesia Physical Anesthesia Plan  ASA: III and emergent  Anesthesia Plan: General   Post-op Pain Management:    Induction: Intravenous  Airway Management Planned: Oral ETT  Additional Equipment:   Intra-op Plan:   Post-operative Plan: Extubation in OR  Informed Consent: I have reviewed the patients History and Physical, chart, labs and discussed the procedure including the risks, benefits and alternatives for the proposed anesthesia with the patient or authorized representative who has indicated his/her understanding and acceptance.     Plan Discussed with: CRNA and Surgeon  Anesthesia Plan Comments:         Anesthesia Quick Evaluation

## 2015-08-19 NOTE — Op Note (Signed)
NAME:  Bonczek, Kye             ACCOUNT NO.:  [id number]  MEDICAL RECORD NO.:  [id number]  LOCATION:  [id number]                       FACILITY:  [facility]  PHYSICIAN:  Harvie JuniorJohn L. Meygan Kyser, M.D.   DATE OF BIRTH:  [birth year]  DATE OF PROCEDURE:  08/19/2015 DATE OF DISCHARGE:                              OPERATIVE REPORT   PREOPERATIVE DIAGNOSIS:  Intertrochanteric hip fracture, left.  POSTOPERATIVE DIAGNOSIS:  Intertrochanteric hip fracture, left.  PROCEDURES: 1. Open reduction and internal fixation of left intertrochanteric hip     fracture with intramedullary nail and a size 11 x 360 mm and an 85     mm compression hip screw with a distal interlock by freehand     technique. 2. Interpretation of multiple intraoperative fluoroscopic images.  SURGEON:  Harvie JuniorJohn L. Ayo Smoak, M.D.  ASSISTANT:  Marshia LyJames Bethune, P.A.  ANESTHESIA:  General.  BRIEF HISTORY:  Mrs. Michael BostonVample is a 74 year old female with history of having fallen on her left hip, she suffered an intertrochanteric hip fracture.  X-ray showed that she had a widely displaced fracture intertrochanteric and we brought to the operating room for open reduction and internal fixation after treatment.  Options were discussed.  I discussed with her son at length and she is a household ambulator with no complaints prior of any hip pain.  She was admitted and cleared by the Internal Medicine Service and brought to the operating room for this procedure.  DESCRIPTION OF PROCEDURE:  The patient was brought to the operating room.  After adequate anesthesia was obtained with general anesthetic, the patient was placed on to the flat [place]Jackson bed with the Hana Outrigger foot extension.  Manipulative closed reduction was undertaken and the patient placed into the stirrup and held in a slightly internally rotated position.  Once this was done, the left hip was prepped and draped in usual sterile fashion with significant traction in place.  Following this,  fluoro was used to outline the area of the hip and the incision was made in the subcutaneous tissue down the level of the hip and once this was done, the guide was placed just over the tip of the trochanter and a guidewire was placed down the center of the femur.  This was over reamed once we get fluoro images, showing intramedullary dead center, intramedullary space of the femur.  We then over-reamed this and then reamed distally to 13 mm, took an 11 mm rod x 360 which was measured as the appropriate length and this was advanced down the distal femur.  Once this was done, the fluoro was used and we had a little bit of a difficult time getting the reduction off the neck and it had just something to do with the angle and a mild displacement she had.  Once we were able to get dead center on AP and lateral fluoro, we put another pin just to hold that position and then we advanced the screw 85 mm down into great subchondral bone.  We, at this point, compressed the fracture and then put it in locking and backed it off a quarter turn, so that it could compress.  We then went distally.  We put a single freehand  locking screw in the slotted hole.  At this point, the wounds were irrigated, suctioned dry, closed in layers.  Sterile compressive dressing was applied and the patient was taken to recovery room, she was noted to be in satisfactory condition.  Estimated blood loss for the procedure was certainly less than 300 mL.     Harvie Junior, M.D.     Ranae Plumber  D:  08/19/2015  T:  08/19/2015  Job:  [id number]  cc:   Harvie Junior, M.D.

## 2015-08-19 NOTE — Anesthesia Postprocedure Evaluation (Signed)
Anesthesia Post Note  Patient: Tammy [name]  Procedure(s) Performed: Procedure(s) (LRB): INTRAMEDULLARY (IM) NAIL INTERTROCHANTRIC (Left)  Anesthesia type: general  Patient location: PACU  Post pain: Pain level controlled  Post assessment: Patient's Cardiovascular Status Stable  Last Vitals:  Filed Vitals:   08/19/15 1041  BP: 126/56  Pulse: 109  Temp: 36.7 C  Resp: 18    Post vital signs: Reviewed and stable  Level of consciousness: sedated  Complications: No apparent anesthesia complications

## 2015-08-19 NOTE — Progress Notes (Signed)
Orthopedic Tech Progress Note Patient Details:  Tammy FallingVirginia T Rolison March 20, 1941 [id number] Deleted double charge for ortho visit. Patient ID: Tammy [name], female   DOB: March 20, 1941, 74 y.o.   MRN: [id number]   Jennye MoccasinHughes, Oluwaseun Cremer Craig 08/19/2015, 8:22 PM

## 2015-08-19 NOTE — Brief Op Note (Signed)
08/18/2015 - 08/19/2015  6:18 PM  PATIENT:  Tammy [name]  74 y.o. female  PRE-OPERATIVE DIAGNOSIS:  hip fracture  POST-OPERATIVE DIAGNOSIS:  hip fracture  PROCEDURE:  Procedure(s): INTRAMEDULLARY (IM) NAIL INTERTROCHANTRIC (Left)  SURGEON:  Surgeon(s) and Role:    * Jodi GeraldsJohn Jalik Gellatly, MD - Primary  PHYSICIAN ASSISTANT:   ASSISTANTS: bethune   ANESTHESIA:   general  EBL:  Total I/O In: 2590 [P.O.:240; I.V.:2000; Other:350] Out: 175 [Urine:100; Blood:75]  BLOOD ADMINISTERED:none  DRAINS: none   LOCAL MEDICATIONS USED:  NONE  SPECIMEN:  No Specimen  DISPOSITION OF SPECIMEN:  N/A  COUNTS:  YES  TOURNIQUET:  * No tourniquets in log *  DICTATION: .Other Dictation: Dictation Number [id number]  PLAN OF CARE: Admit to inpatient   PATIENT DISPOSITION:  PACU - hemodynamically stable.   Delay start of Pharmacological VTE agent (>24hrs) due to surgical blood loss or risk of bleeding: no

## 2015-08-20 ENCOUNTER — Encounter (HOSPITAL_COMMUNITY): Payer: Self-pay | Admitting: Orthopedic Surgery

## 2015-08-20 DIAGNOSIS — D62 Acute posthemorrhagic anemia: Secondary | ICD-10-CM

## 2015-08-20 LAB — URINE CULTURE: Culture: NO GROWTH

## 2015-08-20 LAB — CBC
HCT: 24.7 % — ABNORMAL LOW (ref 36.0–46.0)
Hemoglobin: 8.3 g/dL — ABNORMAL LOW (ref 12.0–15.0)
MCH: 32 pg (ref 26.0–34.0)
MCHC: 33.6 g/dL (ref 30.0–36.0)
MCV: 95.4 fL (ref 78.0–100.0)
Platelets: 216 10*3/uL (ref 150–400)
RBC: 2.59 MIL/uL — ABNORMAL LOW (ref 3.87–5.11)
RDW: 13.4 % (ref 11.5–15.5)
WBC: 11 10*3/uL — ABNORMAL HIGH (ref 4.0–10.5)

## 2015-08-20 LAB — BASIC METABOLIC PANEL
Anion gap: 7 (ref 5–15)
BUN: 40 mg/dL — ABNORMAL HIGH (ref 6–20)
CO2: 24 mmol/L (ref 22–32)
Calcium: 8.8 mg/dL — ABNORMAL LOW (ref 8.9–10.3)
Chloride: 105 mmol/L (ref 101–111)
Creatinine, Ser: 1.25 mg/dL — ABNORMAL HIGH (ref 0.44–1.00)
GFR calc Af Amer: 48 mL/min — ABNORMAL LOW (ref >60)
GFR calc non Af Amer: 41 mL/min — ABNORMAL LOW (ref >60)
Glucose, Bld: 141 mg/dL — ABNORMAL HIGH (ref 65–99)
Potassium: 5 mmol/L (ref 3.5–5.1)
Sodium: 136 mmol/L (ref 135–145)

## 2015-08-20 MED ORDER — DIPHENHYDRAMINE HCL 25 MG PO CAPS
50.0000 mg | ORAL_CAPSULE | Freq: Once | ORAL | Status: AC
  Administered 2015-08-20: 50 mg via ORAL
  Filled 2015-08-20: qty 2

## 2015-08-20 NOTE — Progress Notes (Signed)
Utilization review completed.  

## 2015-08-20 NOTE — Evaluation (Signed)
Physical Therapy Evaluation Patient Details Name: Tammy FallingVirginia T [name] MRN: [id number] DOB: 1941-05-25 Today's Date: 08/20/2015   History of Present Illness  74 y.o. [facility] s/p fall with left hip fx and possible pelvic fracture.  Pt s/p IM nail and is WBAT post-op left leg.  Pt with significant PMHx of insomnia, HTN, and per pt report (not in chart) stroke.  Clinical Impression  Pt was able to get up OOB to chair with two person assist today.  We initiated her L LE exercise program.  She is a very high fall risk and lives alone. She is most appropriate for SNF level rehab at discharge.   PT to follow acutely for deficits listed below.       Follow Up Recommendations SNF    Equipment Recommendations  None recommended by PT    Recommendations for Other Services   NA    Precautions / Restrictions Precautions Precautions: Fall Precaution Comments: per pt report she has fallen several times in the past month Restrictions LLE Weight Bearing: Weight bearing as tolerated      Mobility  Bed Mobility Overal bed mobility: +2 for physical assistance;Needs Assistance Bed Mobility: Supine to Sit     Supine to sit: +2 for physical assistance;Mod assist     General bed mobility comments: Two person mod assist to get to sitting EOB.  Assist at trunk to prevent posterior LOB at times, HHA, at times railing assist, second person assisting at left leg to help progress it to EOB.   Transfers Overall transfer level: Needs assistance Equipment used: Rolling walker (2 wheeled) Transfers: Sit to/from [employer] CorporationStand;Stand Pivot Transfers Sit to Stand: +2 physical assistance;Mod assist Stand pivot transfers: +2 physical assistance;Mod assist       General transfer comment: Two person mod assist to support pt's trunk in standing and to help maneuver RW.  Pt needs verbal cues for safe hand placement and to weight shift off of her painful leg.   Ambulation/Gait              General Gait Details: unable to walk at this time.          Balance Overall balance assessment: Needs assistance Sitting-balance support: Feet supported;No upper extremity supported Sitting balance-Leahy Scale: Poor Sitting balance - Comments: min assist EOB due to posterior/right lateral lean Postural control: Right lateral lean;Posterior lean Standing balance support: Bilateral upper extremity supported Standing balance-Leahy Scale: Poor Standing balance comment: needs RW for support and external assist from therapist.                              Pertinent Vitals/Pain Pain Assessment: 0-10 Pain Score: 8  Pain Location: left leg/hip Pain Descriptors / Indicators: Aching;Burning;Grimacing;Guarding Pain Intervention(s): Limited activity within patient's tolerance;Monitored during session;Repositioned    Home Living Family/patient expects to be discharged to:: Private residence Living Arrangements: Alone Available Help at Discharge: Family;Available PRN/intermittently Type of Home: House Home Access: Level entry     Home Layout: One level Home Equipment: Walker - 2 wheels      Prior Function Level of Independence: Independent         Comments: per pt report she doesn't use an assistive device      Hand Dominance   Dominant Hand: Right    Extremity/Trunk Assessment   Upper Extremity Assessment: Defer to OT evaluation           Lower Extremity Assessment:  LLE deficits/detail   LLE Deficits / Details: left leg with normal post op pain and weakness.  Pt with at least 3/5 ankle DF, 2+/5 knee, 2/5 hip   Cervical / Trunk Assessment: Other exceptions  Communication   Communication: No difficulties  Cognition Arousal/Alertness: Awake/alert Behavior During Therapy: WFL for tasks assessed/performed Overall Cognitive Status: No family/caregiver present to determine baseline cognitive functioning                         Exercises Total  Joint Exercises Ankle Circles/Pumps: AAROM;Both;10 reps;Supine Heel Slides: AAROM;Left;5 reps;Supine Hip ABduction/ADduction: AAROM;Left;5 reps;Supine      Assessment/Plan    PT Assessment Patient needs continued PT services  PT Diagnosis Difficulty walking;Abnormality of gait;Acute pain;Generalized weakness   PT Problem List Decreased strength;Decreased range of motion;Decreased activity tolerance;Decreased balance;Decreased mobility;Decreased knowledge of use of DME;Pain  PT Treatment Interventions DME instruction;Gait training;Functional mobility training;Therapeutic activities;Therapeutic exercise;Balance training;Neuromuscular re-education;Patient/family education;Stair training;Manual techniques;Modalities   PT Goals (Current goals can be found in the Care Plan section) Acute Rehab PT Goals Patient Stated Goal: to decrease pain PT Goal Formulation: With patient Time For Goal Achievement: 08/27/15 Potential to Achieve Goals: Good    Frequency Min 3X/week   Barriers to discharge Decreased caregiver support         End of Session Equipment Utilized During Treatment: Gait belt Activity Tolerance: Patient limited by pain Patient left: in chair;with call bell/phone within reach;with chair alarm set Nurse Communication: Mobility status         Time: 1610-9604 PT Time Calculation (min) (ACUTE ONLY): 27 min   Charges:   PT Evaluation $Initial PT Evaluation Tier I: 1 Procedure PT Treatments $Therapeutic Activity: 8-22 mins        Averil Digman B. Iolani Twilley, PT, DPT [phone]   08/20/2015, 6:09 PM

## 2015-08-20 NOTE — Transfer of Care (Signed)
Immediate Anesthesia Transfer of Care Note  Patient: Tammy [name]  Procedure(s) Performed: Procedure(s): INTRAMEDULLARY (IM) NAIL INTERTROCHANTRIC (Left)  Patient Location: PACU  Anesthesia Type:General  Level of Consciousness: awake  Airway & Oxygen Therapy: Patient Spontanous Breathing and Patient connected to nasal cannula oxygen  Post-op Assessment: Report given to RN and Post -op Vital signs reviewed and stable  Post vital signs: Reviewed and stable  Last Vitals:  Filed Vitals:   08/20/15 0455  BP: 112/43  Pulse: 119  Temp: 37.1 C  Resp: 20    Complications: No apparent anesthesia complications

## 2015-08-20 NOTE — Progress Notes (Signed)
OT Cancellation Note  Patient Details Name: Tammy [name] MRN: [id number] DOB: 1941-03-22   Cancelled Treatment:    Reason Eval/Treat Not Completed: Patient declined, no reason specified. Pt planning on d/c to a SNF for further rehab. Pt declined OOB activities at this time despite max verbal encouragement. Pt stated "I am in too much pain and just got back to bed". Will check back for OT eval as time allows and pt is appropriate.   Gaye AlkenBailey A Keyon Winnick M.S., OTR/L Pager: [phone]  08/20/2015, 12:56 PM

## 2015-08-20 NOTE — Progress Notes (Signed)
[facility]                                                                              Patient Demographics  Tammy [name], is a 74 y.o. female, DOB - 03-Jul-1941, ZOX:[id number]  Admit date - 08/18/2015   Admitting Physician Ron Parker, MD  Outpatient Primary MD for the patient is Cala Bradford, MD  LOS - 2   Chief Complaint  Patient presents with  . Fall       Brief HPI    Tammy [name] is a 74 y.o. female with a history of HTN who fell onto her Left side while walking to the bathroom. She had increased pain in her left hip and was unable to bear weight on her left leg. In the ED and X-ray revealed a Left Hip Fracture and Orthopedics Dr Luiz Blare was consulted. She was referred for admission with plans for surgery.   Assessment & Plan    Principal Problem:   Displaced intertrochanteric fracture of left femur (HCC) - Orthopedics consulted, status post intramedullary nail surgery, postop day #1 - Continue pain control, DVT prophylaxis per orthopedics - Start physical therapy today, although patient seems reluctant to do PT today  Active Problems:   Severe protein-calorie malnutrition (HCC) - Nutrition consult    HTN (hypertension) - Currently stable, continue lisinopril, Coreg  GERD - Continue PPI    Congestive dilated cardiomyopathy (HCC) -Currently compensated, 2-D echo in 6/16 had shown EF of 40-45% with grade 2 diastolic dysfunction - Continue Coreg, lisinopril  - DC IV fluids  Anemia: Postoperative - Monitor H&H closely, currently hemoglobin stable, transfuse if less than 8   Code Status: Full CODE STATUS   Family Communication: Discussed in detail with the patient, all imaging results, lab results explained to the patient   Disposition Plan: Will likely need skilled nursing facility  Time Spent in minutes   25 minutes  Procedures  None   Consults   Ortho  DVT Prophylaxis   SCD's  Medications  Scheduled  Meds: . aspirin EC  325 mg Oral BID PC  . carvedilol  6.25 mg Oral BID WC  . docusate sodium  100 mg Oral BID  . feeding supplement  1 Container Oral BID BM  . ferrous sulfate  325 mg Oral BID WC  . lisinopril  2.5 mg Oral Daily  . nicotine  21 mg Transdermal Daily  . pantoprazole  40 mg Oral Daily  . sertraline  50 mg Oral Daily   Continuous Infusions: . sodium chloride 50 mL/hr at 08/20/15 0300   PRN Meds:.acetaminophen **OR** acetaminophen, albuterol, alum & mag hydroxide-simeth, bisacodyl, HYDROmorphone (DILAUDID) injection, HYDROmorphone (DILAUDID) injection, magnesium citrate, meperidine (DEMEROL) injection, methocarbamol **OR** methocarbamol (ROBAXIN)  IV, ondansetron **OR** ondansetron (ZOFRAN) IV, ondansetron (ZOFRAN) IV, oxyCODONE-acetaminophen, polyethylene glycol   Antibiotics   Anti-infectives    Start     Dose/Rate Route Frequency Ordered Stop   08/19/15 1800  ceFAZolin (ANCEF) IVPB 2 g/50 mL premix     2 g 100 mL/hr over 30 Minutes Intravenous Every 6 hours 08/19/15 1454 08/20/15 0030   08/19/15 0800  ceFAZolin (ANCEF)  IVPB 2 g/50 mL premix     2 g 100 mL/hr over 30 Minutes Intravenous To [facility] 08/18/15 2333 08/19/15 1224        Subjective:   Tammy [name] was seen and examined today.  Pain controlled, no fevers or chills. Patient denies dizziness, chest pain, shortness of breath, abdominal pain, N/V/D/C. No acute events overnight.  Doing well although reluctant to do physical therapy today  Objective:   Blood pressure 106/45, pulse 111, temperature 98.8 F (37.1 C), temperature source Oral, resp. rate 20, height 5' 5.5" (1.664 m), weight 45.36 kg (100 lb), SpO2 95 %.  Wt Readings from Last 3 Encounters:  08/18/15 45.36 kg (100 lb)  03/02/15 44.951 kg (99 lb 1.6 oz)  01/06/15 43.364 kg (95 lb 9.6 oz)     Intake/Output Summary (Last 24 hours) at 08/20/15 1123 Last data filed at 08/20/15 0900  Gross per 24 hour  Intake   3580 ml  Output     575 ml  Net   3005 ml    Exam  General: Alert and oriented x 3, NAD  HEENT:  PERRLA, EOMI, Anicteric Sclera, mucous membranes moist.   Neck: Supple, no JVD, no masses  CVS: S1 S2 clear, RRR  Respiratory: CTAB  Abdomen: Soft, NT, ND  Ext: no cyanosis clubbing or edema  Neuro: no new deficit  Skin: No rashes  Psych: Normal affect and demeanor, alert and oriented x3    Data Review   Micro Results Recent Results (from the past 240 hour(s))  MRSA PCR Screening     Status: None   Collection Time: 08/19/15  2:47 AM  Result Value Ref Range Status   MRSA by PCR NEGATIVE NEGATIVE Final    Comment:        The GeneXpert MRSA Assay (FDA approved for NASAL specimens only), is one component of a comprehensive MRSA colonization surveillance program. It is not intended to diagnose MRSA infection nor to guide or monitor treatment for MRSA infections.     Radiology Reports Dg Chest 1 View  08/18/2015  CLINICAL DATA:  Status post fall, with concern for chest injury. Initial encounter. EXAM: CHEST 1 VIEW COMPARISON:  Chest radiograph performed 05/02/2015 FINDINGS: The lungs are well-aerated and clear. There is no evidence of focal opacification, pleural effusion or pneumothorax. The cardiomediastinal silhouette is within normal limits. No acute osseous abnormalities are seen. Lateral osteophytes are noted along the thoracic spine. IMPRESSION: No acute cardiopulmonary process seen. No displaced rib fractures identified. Electronically Signed   By: Roanna Raider M.D.   On: 08/18/2015 21:40   Dg C-arm 1-60 Min  08/19/2015  CLINICAL DATA:  Operative imaging for ORIF of a left proximal femur fracture. EXAM: DG C-ARM 61-120 MIN; LEFT FEMUR 2 VIEWS COMPARISON:  08/18/2015 FINDINGS: Intra medullary rod supports a compression screw which have reduced the primary fracture components of the intertrochanteric left proximal femur fracture into near anatomic alignment. The orthopedic hardware  is well-seated and aligned. There is no evidence of an operative complication. IMPRESSION: Well-aligned fracture fragments following ORIF of the left intertrochanteric proximal femur fracture. Electronically Signed   By: Amie Portland M.D.   On: 08/19/2015 13:56   Dg Hip Unilat With Pelvis 2-3 Views Left  08/18/2015  CLINICAL DATA:  Fall with left hip fracture. EXAM: DG HIP (WITH OR WITHOUT PELVIS) 2-3V LEFT COMPARISON:  None. FINDINGS: Exam demonstrates moderate degenerative changes of the hips. There is mild diffuse decreased bone mineralization. There is a moderately  displaced trochanteric fracture of the left femoral neck. Displaced lesser trochanter. Posterior angulation of the distal fragment with mild impaction. Possible nondisplaced fracture of the left inferior pubic ramus. There are degenerative changes of the spine. IMPRESSION: Moderately displaced intertrochanteric fracture left hip. Possible fracture of the left inferior pubic ramus. Electronically Signed   By: Elberta Fortisaniel  Boyle M.D.   On: 08/18/2015 21:41   Dg Femur Min 2 Views Left  08/19/2015  CLINICAL DATA:  Operative imaging for ORIF of a left proximal femur fracture. EXAM: DG C-ARM 61-120 MIN; LEFT FEMUR 2 VIEWS COMPARISON:  08/18/2015 FINDINGS: Intra medullary rod supports a compression screw which have reduced the primary fracture components of the intertrochanteric left proximal femur fracture into near anatomic alignment. The orthopedic hardware is well-seated and aligned. There is no evidence of an operative complication. IMPRESSION: Well-aligned fracture fragments following ORIF of the left intertrochanteric proximal femur fracture. Electronically Signed   By: Amie Portlandavid  Ormond M.D.   On: 08/19/2015 13:56    CBC  Recent Labs Lab 08/18/15 2209 08/19/15 0352 08/20/15 0345  WBC 8.6 7.8 11.0*  HGB 10.9* 10.7* 8.3*  HCT 32.9* 32.6* 24.7*  PLT 265 275 216  MCV 94.8 95.0 95.4  MCH 31.4 31.2 32.0  MCHC 33.1 32.8 33.6  RDW 13.1 13.1  13.4  LYMPHSABS 1.3  --   --   MONOABS 0.3  --   --   EOSABS 0.2  --   --   BASOSABS 0.0  --   --     Chemistries   Recent Labs Lab 08/18/15 2209 08/19/15 0352 08/20/15 0345  NA 132* 133* 136  K 4.9 5.3* 5.0  CL 99* 101 105  CO2 25 23 24   GLUCOSE 135* 169* 141*  BUN 27* 32* 40*  CREATININE 1.02* 1.07* 1.25*  CALCIUM 9.3 9.5 8.8*  AST 29  --   --   ALT 20  --   --   ALKPHOS 58  --   --   BILITOT 0.3  --   --    ------------------------------------------------------------------------------------------------------------------ estimated creatinine clearance is 27.9 mL/min (by C-G formula based on Cr of 1.25). ------------------------------------------------------------------------------------------------------------------ No results for input(s): HGBA1C in the last 72 hours. ------------------------------------------------------------------------------------------------------------------ No results for input(s): CHOL, HDL, LDLCALC, TRIG, CHOLHDL, LDLDIRECT in the last 72 hours. ------------------------------------------------------------------------------------------------------------------ No results for input(s): TSH, T4TOTAL, T3FREE, THYROIDAB in the last 72 hours.  Invalid input(s): FREET3 ------------------------------------------------------------------------------------------------------------------ No results for input(s): VITAMINB12, FOLATE, FERRITIN, TIBC, IRON, RETICCTPCT in the last 72 hours.  Coagulation profile  Recent Labs Lab 08/18/15 2209  INR 1.08    No results for input(s): DDIMER in the last 72 hours.  Cardiac Enzymes No results for input(s): CKMB, TROPONINI, MYOGLOBIN in the last 168 hours.  Invalid input(s): CK ------------------------------------------------------------------------------------------------------------------ Invalid input(s): POCBNP  No results for input(s): GLUCAP in the last 72 hours.   Tammy [name] M.D. [facility] 08/20/2015, 11:23 AM  Pager: [phone] Between 7am to 7pm - call Pager - [phone]  After 7pm go to [email or web address] - password [email or web address]  Call night coverage person covering after 7pm

## 2015-08-20 NOTE — Progress Notes (Signed)
Pt removed IV, blood bank bracelet, pulse ox finger probe. Foley catheter removed because pt was straining against it and was voiding around it. She remains alert and oriented. She has not slept well. States that she takes "pm"medicines to help her sleep at home. She has talked most of the night.

## 2015-08-20 NOTE — Progress Notes (Signed)
Subjective: 1 Day Post-Op Procedure(s) (LRB): INTRAMEDULLARY (IM) NAIL INTERTROCHANTRIC (Left) Patient reports pain as moderate. "My hip only hurts when I move it. I do not want to get out of bed today " Taking fluids by mouth okay. Foley catheter removed early this a.m. No reports of dizziness.   Objective: Vital signs in last 24 hours: Temp:  [98 F (36.7 C)-100.7 F (38.2 C)] 98.8 F (37.1 C) (11/14 0455) Pulse Rate:  [109-133] 119 (11/14 0455) Resp:  [11-28] 20 (11/14 0455) BP: (112-158)/(43-61) 112/43 mmHg (11/14 0455) SpO2:  [97 %-100 %] 100 % (11/14 0455)  Intake/Output from previous day: 11/13 0701 - 11/14 0700 In: 3690 [P.O.:740; I.V.:2600] Out: 575 [Urine:500; Blood:75] Intake/Output this shift:     Recent Labs  08/18/15 2209 08/19/15 0352 08/20/15 0345  HGB 10.9* 10.7* 8.3*    Recent Labs  08/19/15 0352 08/20/15 0345  WBC 7.8 11.0*  RBC 3.43* 2.59*  HCT 32.6* 24.7*  PLT 275 216    Recent Labs  08/19/15 0352 08/20/15 0345  NA 133* 136  K 5.3* 5.0  CL 101 105  CO2 23 24  BUN 32* 40*  CREATININE 1.07* 1.25*  GLUCOSE 169* 141*  CALCIUM 9.5 8.8*    Recent Labs  08/18/15 2209  INR 1.08   left hip exam:  Neurovascular intact Sensation intact distally Intact pulses distally Dorsiflexion/Plantar flexion intact Incision: dressing C/D/I Compartment soft  Assessment/Plan: 1 Day Post-Op Procedure(s) (LRB): INTRAMEDULLARY (IM) NAIL INTERTROCHANTRIC (Left) Acute blood loss anemia, asymptomatic, stable. Expected. Plan: Weight-bear as tolerated on left. Aspirin 325 mg enteric-coated twice daily along with SCDs for DVT prophylaxis. Continue oral iron. Check CBC in a.m. Up with therapy Discharge to SNF in a couple of days. I encouraged her to get out of bed today with therapy, but she seemed resistant to this idea.  Tammy [name] G 08/20/2015, 8:22 AM

## 2015-08-21 DIAGNOSIS — D62 Acute posthemorrhagic anemia: Secondary | ICD-10-CM

## 2015-08-21 LAB — CBC
HCT: 21.9 % — ABNORMAL LOW (ref 36.0–46.0)
Hemoglobin: 7.2 g/dL — ABNORMAL LOW (ref 12.0–15.0)
MCH: 31.9 pg (ref 26.0–34.0)
MCHC: 32.9 g/dL (ref 30.0–36.0)
MCV: 96.9 fL (ref 78.0–100.0)
Platelets: 217 10*3/uL (ref 150–400)
RBC: 2.26 MIL/uL — ABNORMAL LOW (ref 3.87–5.11)
RDW: 13.5 % (ref 11.5–15.5)
WBC: 13.7 10*3/uL — ABNORMAL HIGH (ref 4.0–10.5)

## 2015-08-21 LAB — BASIC METABOLIC PANEL
Anion gap: 10 (ref 5–15)
BUN: 49 mg/dL — ABNORMAL HIGH (ref 6–20)
CO2: 25 mmol/L (ref 22–32)
Calcium: 9 mg/dL (ref 8.9–10.3)
Chloride: 102 mmol/L (ref 101–111)
Creatinine, Ser: 1.16 mg/dL — ABNORMAL HIGH (ref 0.44–1.00)
GFR calc Af Amer: 52 mL/min — ABNORMAL LOW (ref >60)
GFR calc non Af Amer: 45 mL/min — ABNORMAL LOW (ref >60)
Glucose, Bld: 167 mg/dL — ABNORMAL HIGH (ref 65–99)
Potassium: 4.4 mmol/L (ref 3.5–5.1)
Sodium: 137 mmol/L (ref 135–145)

## 2015-08-21 LAB — HEMOGLOBIN AND HEMATOCRIT, BLOOD
HCT: 29.6 % — ABNORMAL LOW (ref 36.0–46.0)
Hemoglobin: 9.8 g/dL — ABNORMAL LOW (ref 12.0–15.0)

## 2015-08-21 LAB — PREPARE RBC (CROSSMATCH)

## 2015-08-21 MED ORDER — SODIUM CHLORIDE 0.9 % IV SOLN
Freq: Once | INTRAVENOUS | Status: AC
  Administered 2015-08-21: 11:00:00 via INTRAVENOUS

## 2015-08-21 MED ORDER — DIPHENHYDRAMINE HCL 25 MG PO CAPS
50.0000 mg | ORAL_CAPSULE | Freq: Every evening | ORAL | Status: DC | PRN
  Administered 2015-08-21: 50 mg via ORAL
  Filled 2015-08-21: qty 2

## 2015-08-21 MED ORDER — SODIUM CHLORIDE 0.9 % IV SOLN: Freq: Once | INTRAVENOUS | Status: AC

## 2015-08-21 MED ORDER — FUROSEMIDE 10 MG/ML IJ SOLN
10.0000 mg | Freq: Once | INTRAMUSCULAR | Status: AC
  Administered 2015-08-21: 10 mg via INTRAVENOUS
  Filled 2015-08-21: qty 2

## 2015-08-21 MED ORDER — DIPHENHYDRAMINE HCL 25 MG PO CAPS: 50.0000 mg | ORAL_CAPSULE | Freq: Every evening | ORAL | Status: DC | PRN

## 2015-08-21 MED ORDER — ENSURE ENLIVE PO LIQD
237.0000 mL | Freq: Two times a day (BID) | ORAL | Status: DC
  Administered 2015-08-21: 237 mL via ORAL

## 2015-08-21 MED ORDER — ZOLPIDEM TARTRATE 5 MG PO TABS: 5.0000 mg | ORAL_TABLET | Freq: Every evening | ORAL | Status: DC | PRN

## 2015-08-21 NOTE — Progress Notes (Signed)
Initial Nutrition Assessment  DOCUMENTATION CODES:   Severe malnutrition in context of chronic illness, Underweight  INTERVENTION:  Discontinue Boost Breeze.  Provide Ensure Enlive po BID, each supplement provides 350 kcal and 20 grams of protein.  Encourage adequate PO intake.  NUTRITION DIAGNOSIS:   Malnutrition related to chronic illness as evidenced by severe depletion of body fat, severe depletion of muscle mass.  GOAL:   Patient will meet greater than or equal to 90% of their needs  MONITOR:   PO intake, Supplement acceptance, Weight trends, Labs, I & O's  REASON FOR ASSESSMENT:   Consult Poor PO  ASSESSMENT:   74 y.o. female with a history of HTN who fell onto her Left side while walking to the bathroom. She had increased pain in her left hip and was unable to bear weight on her left leg. In the ED and X-ray revealed a Left Hip Fracture  PROCEDURE(11/13): INTRAMEDULLARY (IM) NAIL INTERTROCHANTRIC (Left)  Pt refused to talk to RD during time of visit. Pt reported she would like the conversation to be "done". No family at bedside. RD unable to obtain most recent nutrition history. Per Epic weight records, weight has been stable. Meal completion has been 10-50%. Pt currently has Boost Breeze ordered however has been refusing them as she would rather have Ensure. RD to modify orders. Limited nutrition-Focused physical exam completed. Findings are severe fat depletion, severe muscle depletion, and mild edema.   Labs and medications reviewed.   Diet Order:  Diet regular Room service appropriate?: Yes; Fluid consistency:: Thin  Skin:   (Incision on L thigh)  Last BM:  11/12  Height:   Ht Readings from Last 1 Encounters:  08/18/15 5' 5.5" (1.664 m)    Weight:   Wt Readings from Last 1 Encounters:  08/18/15 100 lb (45.36 kg)    Ideal Body Weight:  57.9 kg  BMI:  Body mass index is 16.38 kg/(m^2).  Estimated Nutritional Needs:   Kcal:   1450-1650  Protein:  70-80 grams  Fluid:  >/=1.5 L/day  EDUCATION NEEDS:   No education needs identified at this time  Roslyn SmilingStephanie Emalia Witkop, MS, RD, LDN Pager # [phone] After hours/ weekend pager # [phone]

## 2015-08-21 NOTE — Progress Notes (Addendum)
Subjective: 2 Days Post-Op Procedure(s) (LRB): INTRAMEDULLARY (IM) NAIL INTERTROCHANTRIC (Left) Patient reports pain as mild.  Patient was out of bed to chair yesterday. She is very conversive this morning.she reports "I feel tired"  Objective: Vital signs in last 24 hours: Temp:  [98.3 F (36.8 C)-99.5 F (37.5 C)] 98.3 F (36.8 C) (11/15 0923) Pulse Rate:  [111-121] 121 (11/15 0923) Resp:  [16-18] 16 (11/15 0923) BP: (106-152)/(42-46) 133/46 mmHg (11/15 0923) SpO2:  [95 %-98 %] 98 % (11/15 0923)  Intake/Output from previous day: 11/14 0701 - 11/15 0700 In: 840 [P.O.:840] Out: -  Intake/Output this shift: Total I/O In: 240 [P.O.:240] Out: -    Recent Labs  08/18/15 2209 08/19/15 0352 08/20/15 0345 08/21/15 0535  HGB 10.9* 10.7* 8.3* 7.2*    Recent Labs  08/20/15 0345 08/21/15 0535  WBC 11.0* 13.7*  RBC 2.59* 2.26*  HCT 24.7* 21.9*  PLT 216 217    Recent Labs  08/20/15 0345 08/21/15 0535  NA 136 137  K 5.0 4.4  CL 105 102  CO2 24 25  BUN 40* 49*  CREATININE 1.25* 1.16*  GLUCOSE 141* 167*  CALCIUM 8.8* 9.0    Recent Labs  08/18/15 2209  INR 1.08   left hip exam: Neurovascular intact Sensation intact distally Intact pulses distally Dorsiflexion/Plantar flexion intact Incision: dressing C/D/I Compartment soft Moderate left thigh swelling. Asssessment/Plan: 2 Days Post-Op Procedure(s) (LRB): INTRAMEDULLARY (IM) NAIL INTERTROCHANTRIC (Left) Acute blood loss anemia.symptomatic Plan: At her age, size, and history of medical comorbidities would transfuse 2 units of packed RBCs today. Up with physical therapy. Weight-bear as tolerated on left. Continue ASA for DVT prophylaxis along with SCDs. Check CBC in a.m. Hopefully will be ready for discharge to skilled nursing facility tomorrow.    Tammy [name] G 08/21/2015, 9:47 AM

## 2015-08-21 NOTE — Care Management Important Message (Signed)
Important Message  Patient Details  Name: Tammy [name] MRN: [id number] Date of Birth: [birth year]   Medicare Important Message Given:  Yes    Oralia RudMegan P Eusevio Schriver 08/21/2015, 12:05 PM

## 2015-08-21 NOTE — Clinical Social Work Placement (Signed)
   CLINICAL SOCIAL WORK PLACEMENT  NOTE  Date:  08/21/2015  Patient Details  Name: Tammy [name] MRN: [id number] Date of Birth: 1941/07/06  Clinical Social Work is seeking post-discharge placement for this patient at the Skilled  Nursing Facility level of care (*CSW will initial, date and re-position this form in  chart as items are completed):  Yes   Patient/family provided with [facility] Clinical Social Work Department's list of facilities offering this level of care within the geographic area requested by the patient (or if unable, by the patient's family).  Yes   Patient/family informed of their freedom to choose among providers that offer the needed level of care, that participate in Medicare, Medicaid or managed care program needed by the patient, have an available bed and are willing to accept the patient.  Yes   Patient/family informed of [facility]'s ownership interest [facility] and [facility], as well as of the fact that they are under no obligation to receive care at these facilities.  PASRR submitted to EDS on       PASRR number received on       Existing PASRR number confirmed on 08/21/15     FL2 transmitted to all facilities in geographic area requested by pt/family on 08/21/15     FL2 transmitted to all facilities within larger geographic area on       Patient informed that his/her managed care company has contracts with or will negotiate with certain facilities, including the following:            Patient/family informed of bed offers received.  Patient chooses bed at       Physician recommends and patient chooses bed at      Patient to be transferred to   on  .  Patient to be transferred to facility by       Patient family notified on   of transfer.  Name of family member notified:        PHYSICIAN Please sign FL2     Additional Comment:    _______________________________________________ Rod MaeVaughn, [name] S, LCSW 08/21/2015, 3:17  PM

## 2015-08-21 NOTE — Clinical Social Work Note (Signed)
Clinical Social Work Assessment  Patient Details  Name: Tammy [name] MRN: [id number] Date of Birth: 07/08/1941  Date of referral:  08/21/15               Reason for consult:  Facility Placement, Discharge Planning                Permission sought to share information with:  Chartered certified accountant granted to share information::     Name::        Agency::  [facility] (preference: [facility])  Relationship::     Contact Information:     Housing/Transportation Living arrangements for the past 2 months:  [place] of Information:  Patient Patient Interpreter Needed:  None Criminal Activity/Legal Involvement Pertinent to Current Situation/Hospitalization:  No - Comment as needed Significant Relationships:  [employer] Lives with:  Self Do you feel safe going back to the place where you live?  No (High fall risk.) Need for family participation in patient care:  No (Coment) (Patient able to make own decisions.)  Care giving concerns:  Patient expressed no concerns at this time.   Social Worker assessment / plan:  CSW received referral for possible SNF placement at time of discharge. CSW met with patient at bedside to discuss discharge disposition planning. Per patient, patient agreeable to PT recommendation for SNF placement and would prefer to discharge to [facility] once medically stable. Patient informed CSW patient is from home alone, but has a friend (whom patient refers to as her daughter) that "checks in" on patient daily. CSW to continue to follow and assist with discharge planning needs.   Employment status:  Retired Science writer) PT Recommendations:  [place] / Referral to community resources:  [place]  Patient/Family's Response to care:  Patient understanding and agreeable to CSW plan of care.  Patient/Family's  Understanding of and Emotional Response to Diagnosis, Current Treatment, and Prognosis:  Patient understanding and agreeable to CSW plan of care.  Emotional Assessment Appearance:  Appears stated age Attitude/Demeanor/Rapport:  Other (Pleasant.) Affect (typically observed):  Accepting, Restless, Pleasant Orientation:  Oriented to Self, Oriented to Place, Oriented to  Time, Oriented to Situation Alcohol / Substance use:  Not Applicable Psych involvement (Current and /or in the community):  No (Comment) (Not appropriate on this admission.)  Discharge Needs  Concerns to be addressed:  No discharge needs identified Readmission within the last 30 days:  No Current discharge risk:  None Barriers to Discharge:  No Barriers Identified   Caroline Sauger, LCSW 08/21/2015, 3:13 PM [phone]

## 2015-08-21 NOTE — [place] FL2 (Signed)
[place] MEDICAID FL2 LEVEL OF CARE SCREENING TOOL     IDENTIFICATION  Patient Name: Tammy [name] Birthdate: May 07, 1941 Sex: female Admission Date (Current Location): 08/18/2015  [facility] [place]:     Facility and Address:  [facility] [place], [address], [place], [place] [address]      Provider Number: [id number]  Attending Physician Name and Address:  Cathren Harshipudeep K Rai, MD  Relative Name and Phone Number:       Current Level of Care: Hospital Recommended Level of Care: Skilled Nursing Facility Prior Approval Number:    Date Approved/Denied:   PASRR Number: [id number]  Discharge Plan: SNF    Current Diagnoses: Patient Active Problem List   Diagnosis Date Noted  . Acute blood loss anemia 08/21/2015  . Postoperative anemia due to acute blood loss 08/20/2015  . Displaced intertrochanteric fracture of left femur (HCC) 08/19/2015  . Closed left hip fracture (HCC) 08/18/2015  . Secondary cardiomyopathy (HCC)   . Acute systolic congestive heart failure, NYHA class 3 (HCC) 03/02/2015  . COPD exacerbation (HCC) 03/02/2015  . Tobacco abuse 03/02/2015  . Severe protein-calorie malnutrition (HCC) 03/02/2015  . HTN (hypertension) 03/02/2015  . Congestive dilated cardiomyopathy (HCC)   . CVA (cerebral infarction) 02/28/2015  . Stroke [facility](HCC)     Orientation ACTIVITIES/SOCIAL BLADDER RESPIRATION    Self, Time, Situation, Place   (n/a) Incontinent Normal  BEHAVIORAL SYMPTOMS/MOOD NEUROLOGICAL BOWEL NUTRITION STATUS  Other (Comment) (n/a)  (n/a) Continent  (Please see discharge summary for any changes.)  PHYSICIAN VISITS COMMUNICATION OF NEEDS Height & Weight Skin    Verbally 5' 5.5" (166.4 cm) 100 lbs. Surgical wounds          AMBULATORY STATUS RESPIRATION    Assist extensive Normal      Personal Care Assistance Level of Assistance  Bathing, Feeding, Dressing Bathing Assistance: Maximum assistance Feeding assistance:  Independent Dressing Assistance: Maximum assistance      Functional Limitations Info   (n/a)             SPECIAL CARE FACTORS FREQUENCY  PT (By licensed PT), OT (By licensed OT)     PT Frequency: 5 OT Frequency: 5           Additional Factors Info  Code Status, Allergies, Psychotropic Code Status Info: FULL Allergies Info: No known allergies. Psychotropic Info: sertraline (ZOLOFT)         Current Medications (08/21/2015): Current Facility-Administered Medications  Medication Dose Route Frequency Provider Last Rate Last Dose  . acetaminophen (TYLENOL) tablet 650 mg  650 mg Oral Q6H PRN Ron ParkerHarvette C Jenkins, MD   650 mg at 08/20/15 2159   Or  . acetaminophen (TYLENOL) suppository 650 mg  650 mg Rectal Q6H PRN Ron ParkerHarvette C Jenkins, MD      . albuterol (PROVENTIL) (2.5 MG/3ML) 0.083% nebulizer solution 2.5 mg  2.5 mg Nebulization Q4H PRN Marshia LyJames Bethune, PA-C      . alum & mag hydroxide-simeth (MAALOX/MYLANTA) 200-200-20 MG/5ML suspension 30 mL  30 mL Oral Q6H PRN Ron ParkerHarvette C Jenkins, MD      . aspirin EC tablet 325 mg  325 mg Oral BID PC Marshia LyJames Bethune, PA-C   325 mg at 08/21/15 1050  . bisacodyl (DULCOLAX) EC tablet 5 mg  5 mg Oral Daily PRN Marshia LyJames Bethune, PA-C      . carvedilol (COREG) tablet 6.25 mg  6.25 mg Oral BID WC Ripudeep Jenna LuoK Rai, MD   6.25 mg at 08/21/15 0849  .  docusate sodium (COLACE) capsule 100 mg  100 mg Oral BID Marshia Ly, PA-C   100 mg at 08/21/15 1050  . feeding supplement (BOOST / RESOURCE BREEZE) liquid 1 Container  1 Container Oral BID BM Marshia Ly, PA-C   1 Container at 08/19/15 1500  . ferrous sulfate tablet 325 mg  325 mg Oral BID WC Marshia Ly, PA-C   325 mg at 08/21/15 0849  . HYDROmorphone (DILAUDID) injection 0.25-0.5 mg  0.25-0.5 mg Intravenous Q5 min PRN Arta Bruce, MD      . HYDROmorphone (DILAUDID) injection 0.5-1 mg  0.5-1 mg Intravenous Q3H PRN Ron Parker, MD   1 mg at 08/19/15 2152  . lisinopril (PRINIVIL,ZESTRIL) tablet 2.5 mg   2.5 mg Oral Daily Marshia Ly, PA-C   2.5 mg at 08/21/15 1050  . magnesium citrate solution 1 Bottle  1 Bottle Oral Once PRN Marshia Ly, PA-C      . meperidine (DEMEROL) injection 6.25-12.5 mg  6.25-12.5 mg Intravenous Q5 min PRN Arta Bruce, MD      . methocarbamol (ROBAXIN) tablet 500 mg  500 mg Oral Q6H PRN Marshia Ly, PA-C   500 mg at 08/19/15 2003   Or  . methocarbamol (ROBAXIN) 500 mg in dextrose 5 % 50 mL IVPB  500 mg Intravenous Q6H PRN Marshia Ly, PA-C      . nicotine (NICODERM CQ - dosed in mg/24 hours) patch 21 mg  21 mg Transdermal Daily Ripudeep Jenna Luo, MD   21 mg at 08/20/15 1610  . ondansetron (ZOFRAN) tablet 4 mg  4 mg Oral Q6H PRN Ron Parker, MD       Or  . ondansetron (ZOFRAN) injection 4 mg  4 mg Intravenous Q6H PRN Ron Parker, MD      . ondansetron [facility]) injection 4 mg  4 mg Intravenous Once PRN Arta Bruce, MD      . oxyCODONE-acetaminophen (PERCOCET/ROXICET) 5-325 MG per tablet 1-2 tablet  1-2 tablet Oral Q4H PRN Margarita Grizzle, MD   2 tablet at 08/21/15 1416  . pantoprazole (PROTONIX) EC tablet 40 mg  40 mg Oral Daily Marshia Ly, PA-C   40 mg at 08/21/15 1050  . polyethylene glycol (MIRALAX / GLYCOLAX) packet 17 g  17 g Oral Daily PRN Marshia Ly, PA-C      . sertraline (ZOLOFT) tablet 50 mg  50 mg Oral Daily Marshia Ly, PA-C   50 mg at 08/21/15 1050   Do not use this list as official medication orders. Please verify with discharge summary.  Discharge Medications:   Medication List    TAKE these medications        aspirin EC 325 MG tablet  Take 1 tablet (325 mg total) by mouth 2 (two) times daily after a meal. Take x 1 month post op to decrease risk of blood clots.     HYDROcodone-acetaminophen 5-325 MG tablet  Commonly known as:  NORCO  Take 1-2 tablets by mouth every 6 (six) hours as needed for severe pain.      ASK your doctor about these medications        acetaminophen 325 MG tablet  Commonly known as:  TYLENOL  Take  2 tablets (650 mg total) by mouth every 6 (six) hours as needed for mild pain (temp >/= 99.5).     albuterol (2.5 MG/3ML) 0.083% nebulizer solution  Commonly known as:  PROVENTIL  Take 3 mLs (2.5 mg total) by nebulization every 4 (four) hours as needed for  wheezing or shortness of breath.     carvedilol 3.125 MG tablet  Commonly known as:  COREG  Take 1 tablet (3.125 mg total) by mouth 2 (two) times daily with a meal.     diphenhydramine-acetaminophen 25-500 MG Tabs tablet  Commonly known as:  TYLENOL PM  Take 1-2 tablets by mouth at bedtime.     feeding supplement Liqd  Take 1 Container by mouth 2 (two) times daily between meals.     lisinopril 2.5 MG tablet  Commonly known as:  PRINIVIL,ZESTRIL  Take 1 tablet (2.5 mg total) by mouth daily.     pantoprazole 40 MG tablet  Commonly known as:  PROTONIX  Take 1 tablet (40 mg total) by mouth daily.     senna-docusate 8.6-50 MG tablet  Commonly known as:  Senokot-S  Take 1 tablet by mouth at bedtime as needed for mild constipation.     sertraline 50 MG tablet  Commonly known as:  ZOLOFT  Take 50 mg by mouth daily.        Relevant Imaging Results:  Relevant Lab Results:  Recent Labs    Additional Information Social Security #: [id number]  Rojelio Brenner [phone]

## 2015-08-21 NOTE — Progress Notes (Signed)
[facility]                                                                              Patient Demographics  Buffi Ewton, is a 74 y.o. female, DOB - Aug 13, 1941, ZOX:[id number]  Admit date - 08/18/2015   Admitting Physician Ron Parker, MD  Outpatient Primary MD for the patient is Cala Bradford, MD  LOS - 3   Chief Complaint  Patient presents with  . Fall       Brief HPI    [place] Kronick is a 75 y.o. female with a history of HTN who fell onto her Left side while walking to the bathroom. She had increased pain in her left hip and was unable to bear weight on her left leg. In the ED and X-ray revealed a Left Hip Fracture and Orthopedics Dr Luiz Blare was consulted. She was referred for admission with plans for surgery.   Assessment & Plan    Principal Problem:   Displaced intertrochanteric fracture of left femur (HCC) - Orthopedics consulted, status post intramedullary nail surgery, postop day #2 - Continue pain control, DVT prophylaxis per orthopedics - Continue physical therapy  Active Problems:   Severe protein-calorie malnutrition (HCC) - Nutrition consult    HTN (hypertension) - Currently stable, continue lisinopril, Coreg  GERD - Continue PPI    Chronic combined systolic and diastolic CHF, Congestive dilated cardiomyopathy (HCC) -Currently compensated, 2-D echo in 6/16 had shown EF of 40-45% with grade 2 diastolic dysfunction - Continue Coreg, lisinopril  - DC IV fluids  Anemia: Acute blood loss anemia/Postoperative -Hemoglobin 7.2, Transfuse 2 units packed RBCs  Leukocytosis Unclear etiology,? Stressed demargination, no fevers or chills, UA negative for UTI, chest x-ray negative for pneumonia   Code Status: Full CODE STATUS   Family Communication: Discussed in detail with the patient, all imaging results, lab results explained to the patient   Disposition Plan: SNF likely in a.m.  Time Spent in minutes   25  minutes  Procedures  None   Consults   Ortho  DVT Prophylaxis   SCD's  Medications  Scheduled Meds: . aspirin EC  325 mg Oral BID PC  . carvedilol  6.25 mg Oral BID WC  . docusate sodium  100 mg Oral BID  . feeding supplement  1 Container Oral BID BM  . ferrous sulfate  325 mg Oral BID WC  . furosemide  10 mg Intravenous Once  . lisinopril  2.5 mg Oral Daily  . nicotine  21 mg Transdermal Daily  . pantoprazole  40 mg Oral Daily  . sertraline  50 mg Oral Daily   Continuous Infusions:   PRN Meds:.acetaminophen **OR** acetaminophen, albuterol, alum & mag hydroxide-simeth, bisacodyl, HYDROmorphone (DILAUDID) injection, HYDROmorphone (DILAUDID) injection, magnesium citrate, meperidine (DEMEROL) injection, methocarbamol **OR** methocarbamol (ROBAXIN)  IV, ondansetron **OR** ondansetron (ZOFRAN) IV, ondansetron (ZOFRAN) IV, oxyCODONE-acetaminophen, polyethylene glycol   Antibiotics   Anti-infectives    Start     Dose/Rate Route Frequency Ordered Stop   08/19/15 1800  ceFAZolin (ANCEF) IVPB 2 g/50 mL premix     2 g 100 mL/hr over 30 Minutes Intravenous Every 6  hours 08/19/15 1454 08/20/15 0030   08/19/15 0800  ceFAZolin (ANCEF) IVPB 2 g/50 mL premix     2 g 100 mL/hr over 30 Minutes Intravenous To [facility] 08/18/15 2333 08/19/15 1224        Subjective:   Rwanda Sparr was seen and examined today.  Denies any specific complaints, pain controlled. No acute issues. Patient denies dizziness, chest pain, shortness of breath, abdominal pain, N/V/D/C. No acute events overnight.    Objective:   Blood pressure 125/47, pulse 110, temperature 98.6 F (37 C), temperature source Oral, resp. rate 16, height 5' 5.5" (1.664 m), weight 45.36 kg (100 lb), SpO2 97 %.  Wt Readings from Last 3 Encounters:  08/18/15 45.36 kg (100 lb)  03/02/15 44.951 kg (99 lb 1.6 oz)  01/06/15 43.364 kg (95 lb 9.6 oz)     Intake/Output Summary (Last 24 hours) at 08/21/15 1148 Last data  filed at 08/21/15 1133  Gross per 24 hour  Intake    870 ml  Output      0 ml  Net    870 ml    Exam  General: Alert and oriented x 3, NAD  HEENT:  PERRLA, EOMI  Neck: Supple, no JVD, no masses  CVS: S1 S2 clear, RRR  Respiratory: CTAB  Abdomen: Soft, NT, ND  Ext: no cyanosis clubbing or edema  Neuro: no new deficit  Skin: No rashes  Psych: Normal affect and demeanor, alert and oriented x3    Data Review   Micro Results Recent Results (from the past 240 hour(s))  MRSA PCR Screening     Status: None   Collection Time: 08/19/15  2:47 AM  Result Value Ref Range Status   MRSA by PCR NEGATIVE NEGATIVE Final    Comment:        The GeneXpert MRSA Assay (FDA approved for NASAL specimens only), is one component of a comprehensive MRSA colonization surveillance program. It is not intended to diagnose MRSA infection nor to guide or monitor treatment for MRSA infections.   Urine culture     Status: None   Collection Time: 08/19/15  7:56 AM  Result Value Ref Range Status   Specimen Description URINE, CATHETERIZED  Final   Special Requests NONE  Final   Culture NO GROWTH 1 DAY  Final   Report Status 08/20/2015 FINAL  Final    Radiology Reports Dg Chest 1 View  08/18/2015  CLINICAL DATA:  Status post fall, with concern for chest injury. Initial encounter. EXAM: CHEST 1 VIEW COMPARISON:  Chest radiograph performed 05/02/2015 FINDINGS: The lungs are well-aerated and clear. There is no evidence of focal opacification, pleural effusion or pneumothorax. The cardiomediastinal silhouette is within normal limits. No acute osseous abnormalities are seen. Lateral osteophytes are noted along the thoracic spine. IMPRESSION: No acute cardiopulmonary process seen. No displaced rib fractures identified. Electronically Signed   By: Roanna Raider M.D.   On: 08/18/2015 21:40   Dg C-arm 1-60 Min  08/19/2015  CLINICAL DATA:  Operative imaging for ORIF of a left proximal femur fracture.  EXAM: DG C-ARM 61-120 MIN; LEFT FEMUR 2 VIEWS COMPARISON:  08/18/2015 FINDINGS: Intra medullary rod supports a compression screw which have reduced the primary fracture components of the intertrochanteric left proximal femur fracture into near anatomic alignment. The orthopedic hardware is well-seated and aligned. There is no evidence of an operative complication. IMPRESSION: Well-aligned fracture fragments following ORIF of the left intertrochanteric proximal femur fracture. Electronically Signed   By: Onalee Hua  Ormond M.D.   On: 08/19/2015 13:56   Dg Hip Unilat With Pelvis 2-3 Views Left  08/18/2015  CLINICAL DATA:  Fall with left hip fracture. EXAM: DG HIP (WITH OR WITHOUT PELVIS) 2-3V LEFT COMPARISON:  None. FINDINGS: Exam demonstrates moderate degenerative changes of the hips. There is mild diffuse decreased bone mineralization. There is a moderately displaced trochanteric fracture of the left femoral neck. Displaced lesser trochanter. Posterior angulation of the distal fragment with mild impaction. Possible nondisplaced fracture of the left inferior pubic ramus. There are degenerative changes of the spine. IMPRESSION: Moderately displaced intertrochanteric fracture left hip. Possible fracture of the left inferior pubic ramus. Electronically Signed   By: Elberta Fortisaniel  Boyle M.D.   On: 08/18/2015 21:41   Dg Femur Min 2 Views Left  08/19/2015  CLINICAL DATA:  Operative imaging for ORIF of a left proximal femur fracture. EXAM: DG C-ARM 61-120 MIN; LEFT FEMUR 2 VIEWS COMPARISON:  08/18/2015 FINDINGS: Intra medullary rod supports a compression screw which have reduced the primary fracture components of the intertrochanteric left proximal femur fracture into near anatomic alignment. The orthopedic hardware is well-seated and aligned. There is no evidence of an operative complication. IMPRESSION: Well-aligned fracture fragments following ORIF of the left intertrochanteric proximal femur fracture. Electronically Signed    By: Amie Portlandavid  Ormond M.D.   On: 08/19/2015 13:56    CBC  Recent Labs Lab 08/18/15 2209 08/19/15 0352 08/20/15 0345 08/21/15 0535  WBC 8.6 7.8 11.0* 13.7*  HGB 10.9* 10.7* 8.3* 7.2*  HCT 32.9* 32.6* 24.7* 21.9*  PLT 265 275 216 217  MCV 94.8 95.0 95.4 96.9  MCH 31.4 31.2 32.0 31.9  MCHC 33.1 32.8 33.6 32.9  RDW 13.1 13.1 13.4 13.5  LYMPHSABS 1.3  --   --   --   MONOABS 0.3  --   --   --   EOSABS 0.2  --   --   --   BASOSABS 0.0  --   --   --     Chemistries   Recent Labs Lab 08/18/15 2209 08/19/15 0352 08/20/15 0345 08/21/15 0535  NA 132* 133* 136 137  K 4.9 5.3* 5.0 4.4  CL 99* 101 105 102  CO2 25 23 24 25   GLUCOSE 135* 169* 141* 167*  BUN 27* 32* 40* 49*  CREATININE 1.02* 1.07* 1.25* 1.16*  CALCIUM 9.3 9.5 8.8* 9.0  AST 29  --   --   --   ALT 20  --   --   --   ALKPHOS 58  --   --   --   BILITOT 0.3  --   --   --    ------------------------------------------------------------------------------------------------------------------ estimated creatinine clearance is 30 mL/min (by C-G formula based on Cr of 1.16). ------------------------------------------------------------------------------------------------------------------ No results for input(s): HGBA1C in the last 72 hours. ------------------------------------------------------------------------------------------------------------------ No results for input(s): CHOL, HDL, LDLCALC, TRIG, CHOLHDL, LDLDIRECT in the last 72 hours. ------------------------------------------------------------------------------------------------------------------ No results for input(s): TSH, T4TOTAL, T3FREE, THYROIDAB in the last 72 hours.  Invalid input(s): FREET3 ------------------------------------------------------------------------------------------------------------------ No results for input(s): VITAMINB12, FOLATE, FERRITIN, TIBC, IRON, RETICCTPCT in the last 72 hours.  Coagulation profile  Recent Labs Lab 08/18/15 2209   INR 1.08    No results for input(s): DDIMER in the last 72 hours.  Cardiac Enzymes No results for input(s): CKMB, TROPONINI, MYOGLOBIN in the last 168 hours.  Invalid input(s): CK ------------------------------------------------------------------------------------------------------------------ Invalid input(s): POCBNP  No results for input(s): GLUCAP in the last 72 hours.   Burleigh Brockmann M.D. [facility] 08/21/2015,  11:48 AM  Pager: [phone] Between 7am to 7pm - call Pager - [phone]  After 7pm go to [email or web address] - password [email or web address]  Call night coverage person covering after 7pm

## 2015-08-21 NOTE — Evaluation (Signed)
Occupational Therapy Evaluation Patient Details Name: Tammy FallingVirginia T [name] MRN: [id number] DOB: 07-26-1941 Today's Date: 08/21/2015    History of Present Illness 74 y.o. [facility] s/p fall with left hip fx and possible pelvic fracture.  Pt s/p IM nail and is WBAT post-op left leg.  Pt with significant PMHx of insomnia, HTN, and per pt report (not in chart) stroke.   Clinical Impression   Pt reports she was independent with ADLs PTA. Currently pt is set up for ADLs in sitting, max assist for LB ADLs, and max assist for sit to stand and stand pivot transfers. Pt refused use of RW today, max hand held assist given for stand pivot from EOB to chair. Educated pt on importance of being OOB and sitting up in chair; pt verbalized understanding but continued to resist sitting up. Recommending SNF prior to return home in order to maximize independence and safety with ADLs and mobility. Pt would benefit from continued skilled OT to increase independence and safety with LB ADLs and functional transfers.     Follow Up Recommendations  SNF;Supervision/Assistance - 24 hour    Equipment Recommendations  Other (comment) (TBD)    Recommendations for Other Services       Precautions / Restrictions Precautions Precautions: Fall Precaution Comments: per pt report she has fallen several times in the past month Restrictions Weight Bearing Restrictions: Yes LLE Weight Bearing: Weight bearing as tolerated      Mobility Bed Mobility Overal bed mobility: Needs Assistance Bed Mobility: Supine to Sit     Supine to sit: Max assist;HOB elevated     General bed mobility comments: Assist with progressing B LEs to EOB. Assist at hips with use of bed pad. Use of rail and HOB elevated.  Transfers Overall transfer level: Needs assistance Equipment used:  (pt refused to use RW) Transfers: Sit to/from UGI CorporationStand;Stand Pivot Transfers Sit to Stand: Max assist Stand pivot transfers: Max  assist       General transfer comment: Pt refused to use RW for support. Hand held assist given. Max assist for sit to stand and stand pivot transfer from EOB to chair.     Balance Overall balance assessment: Needs assistance Sitting-balance support: Bilateral upper extremity supported Sitting balance-Leahy Scale: Poor     Standing balance support: Bilateral upper extremity supported Standing balance-Leahy Scale: Zero                              ADL Overall ADL's : Needs assistance/impaired Eating/Feeding: Set up;Sitting   Grooming: Set up;Sitting       Lower Body Bathing: Maximal assistance;Sit to/from stand       Lower Body Dressing: Maximal assistance;Sit to/from stand   Toilet Transfer: Maximal assistance;Stand-pivot;BSC Toilet Transfer Details (indicate cue type and reason): Simulated toilet transfer by stand pivot from EOB to chair. Pt refused to use RW for support, hand held support given Toileting- ArchitectClothing Manipulation and Hygiene: Maximal assistance;Sit to/from stand         General ADL Comments: No family present for OT eval. Pt moving slowing this afternoon, stating she is in a lot of pain. Educated pt on benefits of getting OOB and sitting up in chair; pt verbalized understanding but continued to resist getting OOB and sitting in chair.     Vision     Perception     Praxis      Pertinent Vitals/Pain Pain Assessment: 0-10 Pain Score:  10-Worst pain ever Pain Location: L hip Pain Descriptors / Indicators: Grimacing;Guarding;Aching Pain Intervention(s): Limited activity within patient's tolerance;Monitored during session;Repositioned     Hand Dominance Right   Extremity/Trunk Assessment Upper Extremity Assessment Upper Extremity Assessment: Generalized weakness   Lower Extremity Assessment Lower Extremity Assessment: Defer to PT evaluation   Cervical / Trunk Assessment Cervical / Trunk Assessment: Other exceptions Cervical /  Trunk Exceptions: pt reports chornic back pain and surgery.     Communication Communication Communication: No difficulties   Cognition Arousal/Alertness: Awake/alert Behavior During Therapy: WFL for tasks assessed/performed Overall Cognitive Status: No family/caregiver present to determine baseline cognitive functioning                     General Comments       Exercises       Shoulder Instructions      Home Living Family/patient expects to be discharged to:: Unsure Living Arrangements: Alone Available Help at Discharge: Family;Available PRN/intermittently                                    Prior Functioning/Environment Level of Independence: Independent        Comments: per pt report she doesn't use an assistive device     OT Diagnosis: Generalized weakness;Acute pain   OT Problem List: Decreased strength;Decreased activity tolerance;Impaired balance (sitting and/or standing);Decreased safety awareness;Decreased knowledge of use of DME or AE;Decreased knowledge of precautions;Pain   OT Treatment/Interventions: Self-care/ADL training;Therapeutic exercise;Patient/family education    OT Goals(Current goals can be found in the care plan section) Acute Rehab OT Goals Patient Stated Goal: to decrease pain OT Goal Formulation: With patient Time For Goal Achievement: 09/04/15 Potential to Achieve Goals: Good ADL Goals Pt Will Perform Lower Body Bathing: with min assist;sit to/from stand (with or without AE) Pt Will Perform Lower Body Dressing: with min assist;sit to/from stand (with or without AE) Pt Will Transfer to Toilet: with min assist;bedside commode;stand pivot transfer Pt Will Perform Toileting - Clothing Manipulation and hygiene: with min assist;sit to/from stand Pt/caregiver will Perform Home Exercise Program: Increased strength;Both right and left upper extremity;With theraband;With Supervision  OT Frequency: Min 2X/week   Barriers to  D/C:            Co-evaluation              End of Session Equipment Utilized During Treatment: Gait belt Nurse Communication: Mobility status  Activity Tolerance: Patient limited by pain Patient left: in chair;with call bell/phone within reach;with chair alarm set   Time: 1330-1350 OT Time Calculation (min): 20 min Charges:  OT General Charges $OT Visit: 1 Procedure OT Evaluation $Initial OT Evaluation Tier I: 1 Procedure G-Codes:     Gaye Alken M.S., OTR/L Pager: [phone]  08/21/2015, 2:05 PM

## 2015-08-22 DIAGNOSIS — I10 Essential (primary) hypertension: Secondary | ICD-10-CM

## 2015-08-22 DIAGNOSIS — I420 Dilated cardiomyopathy: Secondary | ICD-10-CM

## 2015-08-22 DIAGNOSIS — E43 Unspecified severe protein-calorie malnutrition: Secondary | ICD-10-CM

## 2015-08-22 DIAGNOSIS — S72142A Displaced intertrochanteric fracture of left femur, initial encounter for closed fracture: Principal | ICD-10-CM

## 2015-08-22 DIAGNOSIS — D62 Acute posthemorrhagic anemia: Secondary | ICD-10-CM

## 2015-08-22 LAB — BASIC METABOLIC PANEL
Anion gap: 8 (ref 5–15)
BUN: 44 mg/dL — ABNORMAL HIGH (ref 6–20)
CO2: 29 mmol/L (ref 22–32)
Calcium: 9.3 mg/dL (ref 8.9–10.3)
Chloride: 103 mmol/L (ref 101–111)
Creatinine, Ser: 0.86 mg/dL (ref 0.44–1.00)
GFR calc Af Amer: >60 mL/min (ref >60)
GFR calc non Af Amer: >60 mL/min (ref >60)
Glucose, Bld: 117 mg/dL — ABNORMAL HIGH (ref 65–99)
Potassium: 4.2 mmol/L (ref 3.5–5.1)
Sodium: 140 mmol/L (ref 135–145)

## 2015-08-22 LAB — TYPE AND SCREEN
ABO/RH(D): O POS
Antibody Screen: NEGATIVE
Unit division: 0
Unit division: 0

## 2015-08-22 LAB — CBC
HCT: 32 % — ABNORMAL LOW (ref 36.0–46.0)
Hemoglobin: 10.5 g/dL — ABNORMAL LOW (ref 12.0–15.0)
MCH: 30.5 pg (ref 26.0–34.0)
MCHC: 32.8 g/dL (ref 30.0–36.0)
MCV: 93 fL (ref 78.0–100.0)
Platelets: 253 10*3/uL (ref 150–400)
RBC: 3.44 MIL/uL — ABNORMAL LOW (ref 3.87–5.11)
RDW: 15.3 % (ref 11.5–15.5)
WBC: 14.3 10*3/uL — ABNORMAL HIGH (ref 4.0–10.5)

## 2015-08-22 MED ORDER — ONDANSETRON HCL 4 MG PO TABS: 4.0000 mg | ORAL_TABLET | Freq: Four times a day (QID) | ORAL | Status: DC | PRN

## 2015-08-22 MED ORDER — ENSURE ENLIVE PO LIQD: 237.0000 mL | Freq: Two times a day (BID) | ORAL | Status: DC

## 2015-08-22 MED ORDER — ASPIRIN EC 325 MG PO TBEC: 325.0000 mg | DELAYED_RELEASE_TABLET | Freq: Two times a day (BID) | ORAL | Status: DC

## 2015-08-22 MED ORDER — FERROUS SULFATE 325 (65 FE) MG PO TABS
325.0000 mg | ORAL_TABLET | Freq: Two times a day (BID) | ORAL | Status: DC
Start: 2015-08-22 — End: 2016-07-02

## 2015-08-22 MED ORDER — POLYETHYLENE GLYCOL 3350 17 G PO PACK: 17.0000 g | PACK | Freq: Every day | ORAL | Status: DC | PRN

## 2015-08-22 MED ORDER — SENNOSIDES-DOCUSATE SODIUM 8.6-50 MG PO TABS: 2.0000 | ORAL_TABLET | Freq: Every day | ORAL | Status: DC

## 2015-08-22 MED ORDER — OXYCODONE HCL 5 MG PO TABS: 5.0000 mg | ORAL_TABLET | Freq: Four times a day (QID) | ORAL | Status: DC | PRN

## 2015-08-22 MED ORDER — ACETAMINOPHEN 500 MG PO TABS: 1000.0000 mg | ORAL_TABLET | Freq: Three times a day (TID) | ORAL | Status: DC

## 2015-08-22 NOTE — Progress Notes (Signed)
Subjective: 3 Days Post-Op Procedure(s) (LRB): INTRAMEDULLARY (IM) NAIL INTERTROCHANTRIC (Left) Patient reports pain as mild. Taking by mouth and voiding okay. Slow progress with physical therapy. Patient reports she is ready to go to rehabilitation center. Feels better overall after transfusion.  Objective: Vital signs in last 24 hours: Temp:  [98.3 F (36.8 C)-99.4 F (37.4 C)] 98.6 F (37 C) (11/16 0534) Pulse Rate:  [94-118] 95 (11/16 0534) Resp:  [14-18] 18 (11/16 0534) BP: (110-134)/(39-60) 134/54 mmHg (11/16 0534) SpO2:  [95 %-99 %] 98 % (11/16 0534)  Intake/Output from previous day: 11/15 0701 - 11/16 0700 In: 1270 [P.O.:600; Blood:670] Out: -  Intake/Output this shift: Total I/O In: 360 [P.O.:360] Out: -    Recent Labs  08/20/15 0345 08/21/15 0535 08/21/15 2149 08/22/15 0936  HGB 8.3* 7.2* 9.8* 10.5*    Recent Labs  08/21/15 0535 08/21/15 2149 08/22/15 0936  WBC 13.7*  --  14.3*  RBC 2.26*  --  3.44*  HCT 21.9* 29.6* 32.0*  PLT 217  --  253    Recent Labs  08/21/15 0535 08/22/15 0936  NA 137 140  K 4.4 4.2  CL 102 103  CO2 25 29  BUN 49* 44*  CREATININE 1.16* 0.86  GLUCOSE 167* 117*  CALCIUM 9.0 9.3   No results for input(s): LABPT, INR in the last 72 hours. Left hip exam: Neurovascular intact Sensation intact distally Intact pulses distally Dorsiflexion/Plantar flexion intact Incision: dressing C/D/I Compartment soft  Assessment/Plan: 3 Days Post-Op Procedure(s) (LRB): INTRAMEDULLARY (IM) NAIL INTERTROCHANTRIC (Left)  Acute blood loss anemia, resolved. Plan: Weight-bear as tolerated on left. Norco 5 mg as needed for pain. Aspirin enteric-coated 325 mg twice daily for DVT prophylaxis. Use 1 month postop. Follow-up with Dr. Luiz BlareGraves in the office in 2 weeks. Discharge to SNF  Eaden Hettinger G 08/22/2015, 11:08 AM

## 2015-08-22 NOTE — Discharge Summary (Signed)
PATIENT DETAILS Name: Tammy [name] Age: 74 y.o. Sex: female Date of Birth: May 17, 1941 MRN: [id number]. Admitting Physician: Ron Parker, MD WGN:[id number],[id number] S, MD  Admit Date: 08/18/2015 Discharge date: 08/22/2015  Recommendations for Outpatient Follow-up:  1. Please repeat CBC and chemistries in 1 week.  2. Ensure follow-up with orthopedics  3. Defer osteoporosis screening/treatment to primary M.D. in the outpatient setting.  4. ASA 325 mg twice a day 1 month for DVT prophylaxis   PRIMARY DISCHARGE DIAGNOSIS:  Principal Problem:   Displaced intertrochanteric fracture of left femur (HCC) Active Problems:   Severe protein-calorie malnutrition (HCC)   HTN (hypertension)   Congestive dilated cardiomyopathy (HCC)   Closed left hip fracture (HCC)   Postoperative anemia due to acute blood loss   Acute blood loss anemia      PAST MEDICAL HISTORY: Past Medical History  Diagnosis Date  . Insomnia   . Hypertension     DISCHARGE MEDICATIONS: Current Discharge Medication List    START taking these medications   Details  aspirin EC 325 MG tablet Take 1 tablet (325 mg total) by mouth 2 (two) times daily after a meal. Take x 1 month post op to decrease risk of blood clots.    ferrous sulfate 325 (65 FE) MG tablet Take 1 tablet (325 mg total) by mouth 2 (two) times daily with a meal.    ondansetron (ZOFRAN) 4 MG tablet Take 1 tablet (4 mg total) by mouth every 6 (six) hours as needed for nausea.    oxyCODONE (ROXICODONE) 5 MG immediate release tablet Take 1 tablet (5 mg total) by mouth every 6 (six) hours as needed for severe pain. Qty: 30 tablet, Refills: 0    polyethylene glycol (MIRALAX / GLYCOLAX) packet Take 17 g by mouth daily as needed for mild constipation.      CONTINUE these medications which have CHANGED   Details  acetaminophen (TYLENOL) 500 MG tablet Take 2 tablets (1,000 mg total) by mouth every 8 (eight) hours. For 5 more days from 08/22/15 and  then change to prn    feeding supplement, ENSURE ENLIVE, (ENSURE ENLIVE) LIQD Take 237 mLs by mouth 2 (two) times daily between meals.    senna-docusate (SENOKOT-S) 8.6-50 MG tablet Take 2 tablets by mouth at bedtime.      CONTINUE these medications which have NOT CHANGED   Details  carvedilol (COREG) 3.125 MG tablet Take 1 tablet (3.125 mg total) by mouth 2 (two) times daily with a meal. Qty: 60 tablet, Refills: 2    lisinopril (PRINIVIL,ZESTRIL) 2.5 MG tablet Take 1 tablet (2.5 mg total) by mouth daily. Qty: 30 tablet, Refills: 2    pantoprazole (PROTONIX) 40 MG tablet Take 1 tablet (40 mg total) by mouth daily. Qty: 30 tablet, Refills: 1    sertraline (ZOLOFT) 50 MG tablet Take 50 mg by mouth daily. Refills: 5    albuterol (PROVENTIL) (2.5 MG/3ML) 0.083% nebulizer solution Take 3 mLs (2.5 mg total) by nebulization every 4 (four) hours as needed for wheezing or shortness of breath. Qty: 75 mL, Refills: 12      STOP taking these medications     diphenhydramine-acetaminophen (TYLENOL PM) 25-500 MG TABS tablet         ALLERGIES:  No Known Allergies  BRIEF HPI:  See H&P, Labs, Consult and Test reports for all details in brief, patient is a 74 year old female with history of hypertension who sustained a mechanical fall and started complaining of left hip pain, x-ray revealed  a left hip fracture.  CONSULTATIONS:   orthopedic surgery  PERTINENT RADIOLOGIC STUDIES: Dg Chest 1 View  08/18/2015  CLINICAL DATA:  Status post fall, with concern for chest injury. Initial encounter. EXAM: CHEST 1 VIEW COMPARISON:  Chest radiograph performed 05/02/2015 FINDINGS: The lungs are well-aerated and clear. There is no evidence of focal opacification, pleural effusion or pneumothorax. The cardiomediastinal silhouette is within normal limits. No acute osseous abnormalities are seen. Lateral osteophytes are noted along the thoracic spine. IMPRESSION: No acute cardiopulmonary process seen. No  displaced rib fractures identified. Electronically Signed   By: Roanna Raider M.D.   On: 08/18/2015 21:40   Dg C-arm 1-60 Min  08/19/2015  CLINICAL DATA:  Operative imaging for ORIF of a left proximal femur fracture. EXAM: DG C-ARM 61-120 MIN; LEFT FEMUR 2 VIEWS COMPARISON:  08/18/2015 FINDINGS: Intra medullary rod supports a compression screw which have reduced the primary fracture components of the intertrochanteric left proximal femur fracture into near anatomic alignment. The orthopedic hardware is well-seated and aligned. There is no evidence of an operative complication. IMPRESSION: Well-aligned fracture fragments following ORIF of the left intertrochanteric proximal femur fracture. Electronically Signed   By: Amie Portland M.D.   On: 08/19/2015 13:56   Dg Hip Unilat With Pelvis 2-3 Views Left  08/18/2015  CLINICAL DATA:  Fall with left hip fracture. EXAM: DG HIP (WITH OR WITHOUT PELVIS) 2-3V LEFT COMPARISON:  None. FINDINGS: Exam demonstrates moderate degenerative changes of the hips. There is mild diffuse decreased bone mineralization. There is a moderately displaced trochanteric fracture of the left femoral neck. Displaced lesser trochanter. Posterior angulation of the distal fragment with mild impaction. Possible nondisplaced fracture of the left inferior pubic ramus. There are degenerative changes of the spine. IMPRESSION: Moderately displaced intertrochanteric fracture left hip. Possible fracture of the left inferior pubic ramus. Electronically Signed   By: Elberta Fortis M.D.   On: 08/18/2015 21:41   Dg Femur Min 2 Views Left  08/19/2015  CLINICAL DATA:  Operative imaging for ORIF of a left proximal femur fracture. EXAM: DG C-ARM 61-120 MIN; LEFT FEMUR 2 VIEWS COMPARISON:  08/18/2015 FINDINGS: Intra medullary rod supports a compression screw which have reduced the primary fracture components of the intertrochanteric left proximal femur fracture into near anatomic alignment. The orthopedic  hardware is well-seated and aligned. There is no evidence of an operative complication. IMPRESSION: Well-aligned fracture fragments following ORIF of the left intertrochanteric proximal femur fracture. Electronically Signed   By: Amie Portland M.D.   On: 08/19/2015 13:56     PERTINENT LAB RESULTS: CBC:  Recent Labs  08/20/15 0345 08/21/15 0535 08/21/15 2149  WBC 11.0* 13.7*  --   HGB 8.3* 7.2* 9.8*  HCT 24.7* 21.9* 29.6*  PLT 216 217  --    CMET CMP     Component Value Date/Time   NA 137 08/21/2015 0535   K 4.4 08/21/2015 0535   CL 102 08/21/2015 0535   CO2 25 08/21/2015 0535   GLUCOSE 167* 08/21/2015 0535   BUN 49* 08/21/2015 0535   CREATININE 1.16* 08/21/2015 0535   CALCIUM 9.0 08/21/2015 0535   PROT 7.0 08/18/2015 2209   ALBUMIN 3.9 08/18/2015 2209   AST 29 08/18/2015 2209   ALT 20 08/18/2015 2209   ALKPHOS 58 08/18/2015 2209   BILITOT 0.3 08/18/2015 2209   GFRNONAA 45* 08/21/2015 0535   GFRAA 52* 08/21/2015 0535    GFR Estimated Creatinine Clearance: 30 mL/min (by C-G formula based on Cr of 1.16).  No results for input(s): LIPASE, AMYLASE in the last 72 hours. No results for input(s): CKTOTAL, CKMB, CKMBINDEX, TROPONINI in the last 72 hours. Invalid input(s): POCBNP No results for input(s): DDIMER in the last 72 hours. No results for input(s): HGBA1C in the last 72 hours. No results for input(s): CHOL, HDL, LDLCALC, TRIG, CHOLHDL, LDLDIRECT in the last 72 hours. No results for input(s): TSH, T4TOTAL, T3FREE, THYROIDAB in the last 72 hours.  Invalid input(s): FREET3 No results for input(s): VITAMINB12, FOLATE, FERRITIN, TIBC, IRON, RETICCTPCT in the last 72 hours. Coags: No results for input(s): INR in the last 72 hours.  Invalid input(s): PT Microbiology: Recent Results (from the past 240 hour(s))  MRSA PCR Screening     Status: None   Collection Time: 08/19/15  2:47 AM  Result Value Ref Range Status   MRSA by PCR NEGATIVE NEGATIVE Final    Comment:         The GeneXpert MRSA Assay (FDA approved for NASAL specimens only), is one component of a comprehensive MRSA colonization surveillance program. It is not intended to diagnose MRSA infection nor to guide or monitor treatment for MRSA infections.   Urine culture     Status: None   Collection Time: 08/19/15  7:56 AM  Result Value Ref Range Status   Specimen Description URINE, CATHETERIZED  Final   Special Requests NONE  Final   Culture NO GROWTH 1 DAY  Final   Report Status 08/20/2015 FINAL  Final     BRIEF HOSPITAL COURSE:   Principal Problem:  Displaced intertrochanteric fracture of left femur: Following a mechanical fall. Underwent intramedullary nail surgery, hospital course complicated by perioperative blood loss anemia, requiring 2 units of PRBC on 11/15. Hemoglobin stable at 9.8 on day of discharge. Orthopedics recommending aspirin 325 mg twice a day 1 month, and partial weightbearing  to left lower extremity. Please ensure follow-up with orthopedics in around 2 weeks time. Defer osteoporosis workup/screening/treatment to primary M.D. in the outpatient setting. Seen by physical therapy, recommendations are for SNF on discharge.  Active Problems: Postoperative anemia due to acute blood loss: As above. Please repeat CBC in 1 week.  Chronic combined systolic and diastolic CHF, Congestive dilated cardiomyopathy: Clinically compensated, 2-D echo in 6/16 had shown EF of 40-45% with grade 2 diastolic dysfunction. Continue Coreg, lisinopril. No volume overload-hence not on any diuretics at present-please reevaluate in the future.  Severe protein-calorie malnutrition: Continue supplements.  HTN (hypertension): Controlled with Coreg and lisinopril.  GERD: Continue PPI.  TODAY-DAY OF DISCHARGE:  Subjective:   Tammy [name] today has no headache,no chest abdominal pain,no new weakness tingling or numbness, feels much better wants to go home today.   Objective:   Blood pressure  134/54, pulse 95, temperature 98.6 F (37 C), temperature source Oral, resp. rate 18, height 5' 5.5" (1.664 m), weight 45.36 kg (100 lb), SpO2 98 %.  Intake/Output Summary (Last 24 hours) at 08/22/15 0922 Last data filed at 08/22/15 [id number]  Gross per 24 hour  Intake   1030 ml  Output      0 ml  Net   1030 ml   Filed Weights   08/18/15 2112  Weight: 45.36 kg (100 lb)    Exam Awake Alert, Oriented *3, No new F.N deficits, Normal affect [place].AT,PERRAL Supple Neck,No JVD, No cervical lymphadenopathy appriciated.  Symmetrical Chest wall movement, Good air movement bilaterally, CTAB RRR,No Gallops,Rubs or new Murmurs, No Parasternal Heave +ve B.Sounds, Abd Soft, Non tender, No organomegaly appriciated, No rebound -  guarding or rigidity. No Cyanosis, Clubbing or edema, No new Rash or bruise  DISCHARGE CONDITION: Stable  DISPOSITION: SNF  DISCHARGE INSTRUCTIONS:    Activity:  See below  Get Medicines reviewed and adjusted: Please take all your medications with you for your next visit with your Primary MD  Please request your Primary MD to go over all hospital tests and procedure/radiological results at the follow up, please ask your Primary MD to get all Hospital records sent to his/her office.  If you experience worsening of your admission symptoms, develop shortness of breath, life threatening emergency, suicidal or homicidal thoughts you must seek medical attention immediately by calling 911 or calling your MD immediately  if symptoms less severe.  You must read complete instructions/literature along with all the possible adverse reactions/side effects for all the Medicines you take and that have been prescribed to you. Take any new Medicines after you have completely understood and accpet all the possible adverse reactions/side effects.   Do not drive when taking Pain medications.   Do not take more than prescribed Pain, Sleep and Anxiety Medications  Special Instructions: If  you have smoked or chewed Tobacco  in the last 2 yrs please stop smoking, stop any regular Alcohol  and or any Recreational drug use.  Wear Seat belts while driving.  Please note  You were cared for by a hospitalist during your hospital stay. Once you are discharged, your primary care physician will handle any further medical issues. Please note that NO REFILLS for any discharge medications will be authorized once you are discharged, as it is imperative that you return to your primary care physician (or establish a relationship with a primary care physician if you do not have one) for your aftercare needs so that they can reassess your need for medications and monitor your lab values.   Diet recommendation: Heart Healthy diet  Discharge Instructions    Call MD for:  redness, tenderness, or signs of infection (pain, swelling, redness, odor or green/yellow discharge around incision site)    Complete by:  As directed      Diet - low sodium heart healthy    Complete by:  As directed      Increase activity slowly    Complete by:  As directed      Partial weight bearing    Complete by:  As directed   % Body Weight:  50%  Laterality:  left  Extremity:  Lower           Follow-up Information    Follow up with GRAVES,JOHN L, MD. Schedule an appointment as soon as possible for a visit in 2 weeks.   Specialty:  Orthopedic Surgery   Contact information:   [address] [place] [address] [phone]       Follow up with Cala Bradford, MD. Schedule an appointment as soon as possible for a visit in 2 weeks.   Specialty:  Family Medicine   Contact information:   [phone] [address] [place] [address] [phone]      Total Time spent on discharge equals 45 minutes  Signed: Jeoffrey Massed 08/22/2015 9:22 AM

## 2015-08-22 NOTE — Discharge Planning (Signed)
[facility]. Patient updated at bedside.  [facility] RN report number: [phone] Transportation: EMS  Marcelline Deistmily Bentleigh [place], [place] [phone] Orthopedics: [phone] Surgical: [phone]

## 2015-08-22 NOTE — Clinical Social Work Placement (Signed)
   CLINICAL SOCIAL WORK PLACEMENT  NOTE  Date:  08/22/2015  Patient Details  Name: Tammy [name] MRN: [id number] Date of Birth: 12-05-1940  Clinical Social Work is seeking post-discharge placement for this patient at the Skilled  Nursing Facility level of care (*CSW will initial, date and re-position this form in  chart as items are completed):  Yes   Patient/family provided with [facility] Clinical Social Work Department's list of facilities offering this level of care within the geographic area requested by the patient (or if unable, by the patient's family).  Yes   Patient/family informed of their freedom to choose among providers that offer the needed level of care, that participate in Medicare, Medicaid or managed care program needed by the patient, have an available bed and are willing to accept the patient.  Yes   Patient/family informed of [facility]'s ownership interest in [facility] and [facility], as well as of the fact that they are under no obligation to receive care at these facilities.  PASRR submitted to EDS on       PASRR number received on       Existing PASRR number confirmed on 08/21/15     FL2 transmitted to all facilities in geographic area requested by pt/family on 08/21/15     FL2 transmitted to all facilities within larger geographic area on       Patient informed that his/her managed care company has contracts with or will negotiate with certain facilities, including the following:        Yes   Patient/family informed of bed offers received.  [facility]     Physician recommends and patient chooses bed at      [facility]  Patient to be transferred to facility by [name]     Patient family notified on 08/22/15 of transfer.  Name of family member notified:  Patient updated at bedside.     PHYSICIAN       Additional Comment:     _______________________________________________ Rod MaeVaughn, Aiysha Jillson S, LCSW 08/22/2015, 10:44 AM

## 2015-11-29 ENCOUNTER — Observation Stay (HOSPITAL_COMMUNITY)
Admission: EM | Admit: 2015-11-29 | Discharge: 2015-12-01 | Disposition: A | Discharge status: Home / Self Care | Payer: Medicare Other | Attending: Internal Medicine | Admitting: Internal Medicine

## 2015-11-29 ENCOUNTER — Encounter (HOSPITAL_COMMUNITY): Payer: Self-pay | Admitting: Family Medicine

## 2015-11-29 ENCOUNTER — Emergency Department (HOSPITAL_COMMUNITY): Payer: Medicare Other

## 2015-11-29 DIAGNOSIS — Z8673 Personal history of transient ischemic attack (TIA), and cerebral infarction without residual deficits: Secondary | ICD-10-CM | POA: Insufficient documentation

## 2015-11-29 DIAGNOSIS — I420 Dilated cardiomyopathy: Secondary | ICD-10-CM | POA: Diagnosis not present

## 2015-11-29 DIAGNOSIS — E860 Dehydration: Secondary | ICD-10-CM

## 2015-11-29 DIAGNOSIS — Y9389 Activity, other specified: Secondary | ICD-10-CM | POA: Diagnosis not present

## 2015-11-29 DIAGNOSIS — M25552 Pain in left hip: Secondary | ICD-10-CM | POA: Diagnosis not present

## 2015-11-29 DIAGNOSIS — W19XXXA Unspecified fall, initial encounter: Secondary | ICD-10-CM | POA: Insufficient documentation

## 2015-11-29 DIAGNOSIS — I110 Hypertensive heart disease with heart failure: Secondary | ICD-10-CM | POA: Diagnosis not present

## 2015-11-29 DIAGNOSIS — F0390 Unspecified dementia without behavioral disturbance: Secondary | ICD-10-CM | POA: Insufficient documentation

## 2015-11-29 DIAGNOSIS — N179 Acute kidney failure, unspecified: Principal | ICD-10-CM | POA: Diagnosis present

## 2015-11-29 DIAGNOSIS — E875 Hyperkalemia: Secondary | ICD-10-CM | POA: Insufficient documentation

## 2015-11-29 DIAGNOSIS — R680 Hypothermia, not associated with low environmental temperature: Secondary | ICD-10-CM | POA: Insufficient documentation

## 2015-11-29 DIAGNOSIS — I5042 Chronic combined systolic (congestive) and diastolic (congestive) heart failure: Secondary | ICD-10-CM | POA: Insufficient documentation

## 2015-11-29 DIAGNOSIS — Z7982 Long term (current) use of aspirin: Secondary | ICD-10-CM | POA: Diagnosis not present

## 2015-11-29 DIAGNOSIS — E872 Acidosis, unspecified: Secondary | ICD-10-CM | POA: Diagnosis present

## 2015-11-29 DIAGNOSIS — E871 Hypo-osmolality and hyponatremia: Secondary | ICD-10-CM | POA: Insufficient documentation

## 2015-11-29 DIAGNOSIS — Y9289 Other specified places as the place of occurrence of the external cause: Secondary | ICD-10-CM | POA: Insufficient documentation

## 2015-11-29 DIAGNOSIS — Z681 Body mass index (BMI) 19 or less, adult: Secondary | ICD-10-CM | POA: Diagnosis not present

## 2015-11-29 DIAGNOSIS — R740 Nonspecific elevation of levels of transaminase and lactic acid dehydrogenase [LDH]: Secondary | ICD-10-CM | POA: Diagnosis not present

## 2015-11-29 DIAGNOSIS — J449 Chronic obstructive pulmonary disease, unspecified: Secondary | ICD-10-CM | POA: Diagnosis not present

## 2015-11-29 DIAGNOSIS — I340 Nonrheumatic mitral (valve) insufficiency: Secondary | ICD-10-CM | POA: Diagnosis not present

## 2015-11-29 DIAGNOSIS — R531 Weakness: Secondary | ICD-10-CM | POA: Insufficient documentation

## 2015-11-29 DIAGNOSIS — R410 Disorientation, unspecified: Secondary | ICD-10-CM | POA: Diagnosis not present

## 2015-11-29 DIAGNOSIS — R451 Restlessness and agitation: Secondary | ICD-10-CM | POA: Diagnosis not present

## 2015-11-29 DIAGNOSIS — R000 Tachycardia, unspecified: Secondary | ICD-10-CM | POA: Diagnosis not present

## 2015-11-29 DIAGNOSIS — F17210 Nicotine dependence, cigarettes, uncomplicated: Secondary | ICD-10-CM | POA: Insufficient documentation

## 2015-11-29 DIAGNOSIS — Z720 Tobacco use: Secondary | ICD-10-CM | POA: Diagnosis present

## 2015-11-29 DIAGNOSIS — Y998 Other external cause status: Secondary | ICD-10-CM | POA: Insufficient documentation

## 2015-11-29 DIAGNOSIS — R7989 Other specified abnormal findings of blood chemistry: Secondary | ICD-10-CM | POA: Diagnosis present

## 2015-11-29 DIAGNOSIS — T68XXXA Hypothermia, initial encounter: Secondary | ICD-10-CM | POA: Insufficient documentation

## 2015-11-29 DIAGNOSIS — I10 Essential (primary) hypertension: Secondary | ICD-10-CM | POA: Diagnosis present

## 2015-11-29 DIAGNOSIS — E43 Unspecified severe protein-calorie malnutrition: Secondary | ICD-10-CM | POA: Insufficient documentation

## 2015-11-29 LAB — CBC
HCT: 38.8 % (ref 36.0–46.0)
Hemoglobin: 13.1 g/dL (ref 12.0–15.0)
MCH: 32.3 pg (ref 26.0–34.0)
MCHC: 33.8 g/dL (ref 30.0–36.0)
MCV: 95.8 fL (ref 78.0–100.0)
Platelets: 228 10*3/uL (ref 150–400)
RBC: 4.05 MIL/uL (ref 3.87–5.11)
RDW: 14.3 % (ref 11.5–15.5)
WBC: 4.6 10*3/uL (ref 4.0–10.5)

## 2015-11-29 LAB — DIFFERENTIAL
Basophils Absolute: 0 10*3/uL (ref 0.0–0.1)
Basophils Relative: 0 %
Eosinophils Absolute: 0 10*3/uL (ref 0.0–0.7)
Eosinophils Relative: 0 %
Lymphocytes Relative: 22 %
Lymphs Abs: 1 10*3/uL (ref 0.7–4.0)
Monocytes Absolute: 0.2 10*3/uL (ref 0.1–1.0)
Monocytes Relative: 4 %
Neutro Abs: 3.5 10*3/uL (ref 1.7–7.7)
Neutrophils Relative %: 74 %

## 2015-11-29 LAB — I-STAT CG4 LACTIC ACID, ED
Lactic Acid, Venous: 3.91 mmol/L (ref 0.5–2.0)
Lactic Acid, Venous: 2.76 mmol/L (ref 0.5–2.0)

## 2015-11-29 LAB — COMPREHENSIVE METABOLIC PANEL
ALT: 15 U/L (ref 14–54)
AST: 38 U/L (ref 15–41)
Albumin: 4.5 g/dL (ref 3.5–5.0)
Alkaline Phosphatase: 92 U/L (ref 38–126)
Anion gap: 17 — ABNORMAL HIGH (ref 5–15)
BUN: 69 mg/dL — ABNORMAL HIGH (ref 6–20)
CO2: 11 mmol/L — ABNORMAL LOW (ref 22–32)
Calcium: 10.1 mg/dL (ref 8.9–10.3)
Chloride: 105 mmol/L (ref 101–111)
Creatinine, Ser: 2.51 mg/dL — ABNORMAL HIGH (ref 0.44–1.00)
GFR calc Af Amer: 20 mL/min — ABNORMAL LOW (ref >60)
GFR calc non Af Amer: 18 mL/min — ABNORMAL LOW (ref >60)
Glucose, Bld: 119 mg/dL — ABNORMAL HIGH (ref 65–99)
Potassium: 6.3 mmol/L (ref 3.5–5.1)
Sodium: 133 mmol/L — ABNORMAL LOW (ref 135–145)
Total Bilirubin: 0.4 mg/dL (ref 0.3–1.2)
Total Protein: 8.3 g/dL — ABNORMAL HIGH (ref 6.5–8.1)

## 2015-11-29 LAB — URINALYSIS, ROUTINE W REFLEX MICROSCOPIC
Glucose, UA: NEGATIVE mg/dL
Hgb urine dipstick: NEGATIVE
Ketones, ur: NEGATIVE mg/dL
Leukocytes, UA: NEGATIVE
Nitrite: NEGATIVE
Protein, ur: NEGATIVE mg/dL
Specific Gravity, Urine: 1.028 (ref 1.005–1.030)
pH: 5 (ref 5.0–8.0)

## 2015-11-29 LAB — BASIC METABOLIC PANEL
Anion gap: 17 — ABNORMAL HIGH (ref 5–15)
BUN: 65 mg/dL — ABNORMAL HIGH (ref 6–20)
CO2: 9 mmol/L — ABNORMAL LOW (ref 22–32)
Calcium: 9.4 mg/dL (ref 8.9–10.3)
Chloride: 108 mmol/L (ref 101–111)
Creatinine, Ser: 1.96 mg/dL — ABNORMAL HIGH (ref 0.44–1.00)
GFR calc Af Amer: 27 mL/min — ABNORMAL LOW (ref >60)
GFR calc non Af Amer: 24 mL/min — ABNORMAL LOW (ref >60)
Glucose, Bld: 59 mg/dL — ABNORMAL LOW (ref 65–99)
Potassium: 4.5 mmol/L (ref 3.5–5.1)
Sodium: 134 mmol/L — ABNORMAL LOW (ref 135–145)

## 2015-11-29 LAB — TSH: TSH: 0.443 u[IU]/mL (ref 0.350–4.500)

## 2015-11-29 LAB — PHOSPHORUS: Phosphorus: 4.8 mg/dL — ABNORMAL HIGH (ref 2.5–4.6)

## 2015-11-29 LAB — MAGNESIUM: Magnesium: 2.2 mg/dL (ref 1.7–2.4)

## 2015-11-29 LAB — I-STAT TROPONIN, ED: Troponin i, poc: 0.02 ng/mL (ref 0.00–0.08)

## 2015-11-29 LAB — CBG MONITORING, ED: Glucose-Capillary: 102 mg/dL — ABNORMAL HIGH (ref 65–99)

## 2015-11-29 MED ORDER — ACETAMINOPHEN 650 MG RE SUPP: 650.0000 mg | Freq: Four times a day (QID) | RECTAL | Status: DC | PRN

## 2015-11-29 MED ORDER — SODIUM CHLORIDE 0.9 % IV SOLN
1.0000 g | Freq: Once | INTRAVENOUS | Status: AC
  Administered 2015-11-29: 1 g via INTRAVENOUS
  Filled 2015-11-29 (×2): qty 10

## 2015-11-29 MED ORDER — PANTOPRAZOLE SODIUM 40 MG PO TBEC
40.0000 mg | DELAYED_RELEASE_TABLET | Freq: Every day | ORAL | Status: DC
  Administered 2015-11-30 – 2015-12-01 (×2): 40 mg via ORAL
  Filled 2015-11-29 (×3): qty 1

## 2015-11-29 MED ORDER — LORAZEPAM 2 MG/ML IJ SOLN
1.0000 mg | Freq: Once | INTRAMUSCULAR | Status: AC
  Administered 2015-11-29: 1 mg via INTRAVENOUS
  Filled 2015-11-29: qty 1

## 2015-11-29 MED ORDER — ALUM & MAG HYDROXIDE-SIMETH 200-200-20 MG/5ML PO SUSP: 30.0000 mL | Freq: Four times a day (QID) | ORAL | Status: DC | PRN

## 2015-11-29 MED ORDER — POLYETHYLENE GLYCOL 3350 17 G PO PACK
17.0000 g | PACK | Freq: Every day | ORAL | Status: DC | PRN
  Filled 2015-11-29: qty 1

## 2015-11-29 MED ORDER — ALBUTEROL SULFATE (2.5 MG/3ML) 0.083% IN NEBU: 2.5000 mg | INHALATION_SOLUTION | RESPIRATORY_TRACT | Status: DC | PRN

## 2015-11-29 MED ORDER — FERROUS SULFATE 325 (65 FE) MG PO TABS
325.0000 mg | ORAL_TABLET | Freq: Two times a day (BID) | ORAL | Status: DC
Start: 2015-11-29 — End: 2015-12-01
  Administered 2015-11-30 – 2015-12-01 (×3): 325 mg via ORAL
  Filled 2015-11-29 (×4): qty 1

## 2015-11-29 MED ORDER — PIPERACILLIN-TAZOBACTAM 3.375 G IVPB 30 MIN
3.3750 g | Freq: Once | INTRAVENOUS | Status: AC
  Administered 2015-11-29: 3.375 g via INTRAVENOUS
  Filled 2015-11-29: qty 50

## 2015-11-29 MED ORDER — ONDANSETRON HCL 4 MG PO TABS: 4.0000 mg | ORAL_TABLET | Freq: Four times a day (QID) | ORAL | Status: DC | PRN

## 2015-11-29 MED ORDER — VANCOMYCIN HCL IN DEXTROSE 1-5 GM/200ML-% IV SOLN
1000.0000 mg | Freq: Once | INTRAVENOUS | Status: AC
  Administered 2015-11-29: 1000 mg via INTRAVENOUS
  Filled 2015-11-29: qty 200

## 2015-11-29 MED ORDER — ENSURE ENLIVE PO LIQD
237.0000 mL | Freq: Three times a day (TID) | ORAL | Status: DC
  Administered 2015-11-30 – 2015-12-01 (×3): 237 mL via ORAL
  Filled 2015-11-29: qty 237

## 2015-11-29 MED ORDER — HALOPERIDOL LACTATE 5 MG/ML IJ SOLN: 1.0000 mg | Freq: Four times a day (QID) | INTRAMUSCULAR | Status: DC | PRN

## 2015-11-29 MED ORDER — ONDANSETRON HCL 4 MG/2ML IJ SOLN: 4.0000 mg | Freq: Four times a day (QID) | INTRAMUSCULAR | Status: DC | PRN

## 2015-11-29 MED ORDER — SODIUM CHLORIDE 0.9 % IV SOLN
INTRAVENOUS | Status: DC
  Administered 2015-11-29 – 2015-11-30 (×2): via INTRAVENOUS

## 2015-11-29 MED ORDER — SODIUM CHLORIDE 0.9% FLUSH
3.0000 mL | Freq: Two times a day (BID) | INTRAVENOUS | Status: DC
  Administered 2015-11-30 – 2015-12-01 (×3): 3 mL via INTRAVENOUS

## 2015-11-29 MED ORDER — ASPIRIN EC 325 MG PO TBEC
325.0000 mg | DELAYED_RELEASE_TABLET | Freq: Two times a day (BID) | ORAL | Status: DC
Start: 2015-11-29 — End: 2015-12-01
  Administered 2015-11-30 (×2): 325 mg via ORAL
  Filled 2015-11-29 (×2): qty 1

## 2015-11-29 MED ORDER — DEXTROSE 50 % IV SOLN
25.0000 mL | Freq: Once | INTRAVENOUS | Status: AC
  Administered 2015-11-29: 25 mL via INTRAVENOUS
  Filled 2015-11-29: qty 50

## 2015-11-29 MED ORDER — ACETAMINOPHEN 325 MG PO TABS
650.0000 mg | ORAL_TABLET | Freq: Four times a day (QID) | ORAL | Status: DC | PRN
  Administered 2015-11-30 (×2): 650 mg via ORAL
  Filled 2015-11-29 (×2): qty 2

## 2015-11-29 MED ORDER — SENNOSIDES-DOCUSATE SODIUM 8.6-50 MG PO TABS
2.0000 | ORAL_TABLET | Freq: Every day | ORAL | Status: DC
  Administered 2015-11-30: 2 via ORAL
  Filled 2015-11-29 (×2): qty 2

## 2015-11-29 MED ORDER — SODIUM CHLORIDE 0.9 % IV BOLUS (SEPSIS)
1000.0000 mL | Freq: Once | INTRAVENOUS | Status: AC
  Administered 2015-11-29: 1000 mL via INTRAVENOUS

## 2015-11-29 MED ORDER — CARVEDILOL 3.125 MG PO TABS: 3.1250 mg | ORAL_TABLET | Freq: Two times a day (BID) | ORAL | Status: DC

## 2015-11-29 MED ORDER — PIPERACILLIN-TAZOBACTAM IN DEX 2-0.25 GM/50ML IV SOLN
2.2500 g | Freq: Three times a day (TID) | INTRAVENOUS | Status: DC
  Filled 2015-11-29 (×2): qty 50

## 2015-11-29 MED ORDER — INSULIN ASPART 100 UNIT/ML IV SOLN
10.0000 [IU] | Freq: Once | INTRAVENOUS | Status: AC
  Administered 2015-11-29: 10 [IU] via INTRAVENOUS
  Filled 2015-11-29: qty 1

## 2015-11-29 MED ORDER — SERTRALINE HCL 50 MG PO TABS
50.0000 mg | ORAL_TABLET | Freq: Every day | ORAL | Status: DC
  Administered 2015-11-30 – 2015-12-01 (×2): 50 mg via ORAL
  Filled 2015-11-29 (×3): qty 1

## 2015-11-29 MED ORDER — HEPARIN SODIUM (PORCINE) 5000 UNIT/ML IJ SOLN
5000.0000 [IU] | Freq: Three times a day (TID) | INTRAMUSCULAR | Status: DC
  Administered 2015-11-29 – 2015-12-01 (×5): 5000 [IU] via SUBCUTANEOUS
  Filled 2015-11-29 (×5): qty 1

## 2015-11-29 NOTE — ED Notes (Signed)
Attempted report x1. 

## 2015-11-29 NOTE — H&P (Signed)
[facility] and Physical                                                                                    Tammy [name], is a 75 y.o. female  MRN: [id number]   DOB - 1940/11/01  Admit Date - 11/29/2015  Outpatient Primary MD for the patient is Cala Bradford, MD  Referring MD: Ammie Dalton  PMH: Past Medical History  Diagnosis Date  . Insomnia   . Hypertension       PSH: Past Surgical History  Procedure Laterality Date  . Intramedullary (im) nail intertrochanteric Left 08/19/2015    Procedure: INTRAMEDULLARY (IM) NAIL INTERTROCHANTRIC;  Surgeon: Jodi Geralds, MD;  Location: [facility] OR;  Service: Orthopedics;  Laterality: Left;     CC:  Chief Complaint  Patient presents with  . Weakness  . 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 She also has history of dementia although not formally listed in her problem list. She is primarily cared for by her son who does not live with the patient that visits with her daily and keeps her medication box filled and make sure she takes her medicines every day. Apparently yesterday patient had episodic emesis but was unable to clarify many times she vomited. She denied diarrhea or abdominal pain. She has apparently been having weakness for several days. No apparent recent falls. Son thought she was more confused today and was not drinking her Ensure as she does at baseline although she apparently had been drinking water regularly. Because of the symptoms he brought her to the ER. Son notes that while the patient was in the triage area after transitioning from a seated to standing position she appeared to have some lightheadedness but no apparent syncope. She has a chronic cough from being a smoker which has not changed. She  apparently has had poor/low weight for quite some time. During my interview and examine the patient she became very agitated stating she was going home. Son attempted to calm the patient and tell her he felt she was unsafe to go home and he would not take her home. When I reemphasized to the patient that she was not safe to go home and at least to give [place] 24 hours to try to get her better she replied to me "I'm a grown woman and he's a grown man and you can't tell either one of [place] what to do". Because of this physical exam was somewhat limited because of patient's agitation. She also had complained of hip pain to her son but after arrival denied this.  ER Evaluation and treatment: Initial rectal temperature was 96.8 and after use of warming blanket was up to 97, initial BP 90/59 with pulse 112 and respirations 18, room air saturations 100% EKG: Sinus tachycardia with ventricular rate 110 bpm, QTC 402 ms, peaked T waves as well as peaked P waves, voltage criteria for LVH, no ischemic changes. PCXR: No active disease XR left hip:  Post ORIF left intertrochanteric femur fracture with no acute findings CT head without contrast: No acute findings. Stable periventricular white matter disease Laboratory data: Na 133, K 6.3, BUN 69, Cr 2.51, anion gap 17, troponin 0.02, lactic acid 3.91, WBCs 4600 with neutrophils 74% and normal absolute neutrophils, hemoglobin 13.1, platelets 228,000, urinalysis small bilirubin otherwise normal blood cultures and urine cultures have been obtained Zosyn 3.37 gm IV 1 Vancomycin 1000 mg IV 1   Review of Systems   In addition to the HPI above,  No Fever-chills, myalgias or other constitutional symptoms reported No Headache, changes with Vision or hearing, new weakness, tingling, numbness in any extremity, No problems swallowing food or Liquids, indigestion/reflux No Chest pain, Cough or Shortness of Breath, palpitations, orthopnea or DOE No Abdominal pain, melena or  hematochezia, no dark tarry stools, denies diarrhea No dysuria, hematuria or flank pain No new skin rashes, lesions, masses or bruises, No new joints pains-aches No polyuria, polydypsia or polyphagia,  *A full 10 point Review of Systems was done, except as stated above, all other Review of Systems were negative.  Social History Social History  Substance Use Topics  . Smoking status: Current Every Day Smoker    Types: Cigarettes  . Smokeless tobacco: Not on file  . Alcohol Use: No    Resides at: Private residence  Lives with: Alone but was son checking on her frequently and assisting with medications and monitoring diet and intake  Ambulatory status: Without assistive devices   Family History Family History  Problem Relation Age of Onset  . Congestive Heart Failure Son 55     Prior to Admission medications   Medication Sig Start Date End Date Taking? Authorizing Provider  acetaminophen (TYLENOL) 500 MG tablet Take 2 tablets (1,000 mg total) by mouth every 8 (eight) hours. For 5 more days from 08/22/15 and then change to prn 08/22/15   Maretta Bees, MD  albuterol (PROVENTIL) (2.5 MG/3ML) 0.083% nebulizer solution Take 3 mLs (2.5 mg total) by nebulization every 4 (four) hours as needed for wheezing or shortness of breath. 03/04/15   Kinnie Scales Rinehuls, PA-C  aspirin EC 325 MG tablet Take 1 tablet (325 mg total) by mouth 2 (two) times daily after a meal. Take x 1 month post op to decrease risk of blood clots. 08/22/15   Shanker Levora Dredge, MD  carvedilol (COREG) 3.125 MG tablet Take 1 tablet (3.125 mg total) by mouth 2 (two) times daily with a meal. 03/04/15   David L Rinehuls, PA-C  feeding supplement, ENSURE ENLIVE, (ENSURE ENLIVE) LIQD Take 237 mLs by mouth 2 (two) times daily between meals. 08/22/15   Shanker Levora Dredge, MD  ferrous sulfate 325 (65 FE) MG tablet Take 1 tablet (325 mg total) by mouth 2 (two) times daily with a meal. 08/22/15   Shanker Levora Dredge, MD  lisinopril  (PRINIVIL,ZESTRIL) 2.5 MG tablet Take 1 tablet (2.5 mg total) by mouth daily. 03/04/15   David L Rinehuls, PA-C  ondansetron (ZOFRAN) 4 MG tablet Take 1 tablet (4 mg total) by mouth every 6 (six) hours as needed for nausea. 08/22/15   Shanker Levora Dredge, MD  oxyCODONE (ROXICODONE) 5 MG immediate release tablet Take 1 tablet (5 mg total) by mouth every 6 (six) hours as needed for severe pain. 08/22/15   Shanker Levora Dredge, MD  pantoprazole (PROTONIX) 40 MG tablet Take 1 tablet (40 mg total) by mouth daily. 03/04/15   David L Rinehuls, PA-C  polyethylene glycol (MIRALAX / GLYCOLAX)  packet Take 17 g by mouth daily as needed for mild constipation. 08/22/15   Shanker Levora Dredge, MD  senna-docusate (SENOKOT-S) 8.6-50 MG tablet Take 2 tablets by mouth at bedtime. 08/22/15   Shanker Levora Dredge, MD  sertraline (ZOLOFT) 50 MG tablet Take 50 mg by mouth daily. 07/28/15   Historical Provider, MD    No Known Allergies  Physical Exam  Vitals  Blood pressure 108/63, pulse 112, temperature 97 F (36.1 C), temperature source Core (Comment), resp. rate 20, height 5' 5.5" (1.664 m), weight 100 lb 1.4 oz (45.4 kg), SpO2 100 %.   General:  In no acute physical distress, appears quite malnourished and underweight and older than stated age  Psych: Agitated affect, oriented to name only as well as to place, appears to have no insight into severity of her illness and keeps demanding to be discharged to home environment and stating "I will not stay here tonight"  Neuro:   No focal neurological deficits, CN II through XII intact, Strength 5/5 all 4 extremities, Sensation intact all 4 extremities.  ENT:  Ears and Eyes appear Normal, Conjunctivae clear, PER. Dry oral mucosa without erythema or exudates.  Neck:  Supple, No lymphadenopathy appreciated  Respiratory:  Symmetrical chest wall movement, diminished air movement bilaterally, but CTA without wheezing. Room Air  Cardiac:  RRR, No Murmurs, no LE edema noted, no  JVD, No carotid bruits, peripheral pulses palpable at 2+  Abdomen:  Positive bowel sounds, Soft, Non tender, Non distended,  No masses appreciated, no obvious hepatosplenomegaly  Skin:  No Cyanosis, poor Skin Turgor, No Skin Rash or Bruise.  Extremities: Symmetrical without obvious trauma or injury,  no effusions.  Data Review  CBC  Recent Labs Lab 11/29/15 1418  WBC 4.6  HGB 13.1  HCT 38.8  PLT 228  MCV 95.8  MCH 32.3  MCHC 33.8  RDW 14.3  LYMPHSABS 1.0  MONOABS 0.2  EOSABS 0.0  BASOSABS 0.0    Chemistries   Recent Labs Lab 11/29/15 1418  NA 133*  K 6.3*  CL 105  CO2 11*  GLUCOSE 119*  BUN 69*  CREATININE 2.51*  CALCIUM 10.1  AST 38  ALT 15  ALKPHOS 92  BILITOT 0.4    estimated creatinine clearance is 13.7 mL/min (by C-G formula based on Cr of 2.51).  No results for input(s): TSH, T4TOTAL, T3FREE, THYROIDAB in the last 72 hours.  Invalid input(s): FREET3  Coagulation profile No results for input(s): INR, PROTIME in the last 168 hours.  No results for input(s): DDIMER in the last 72 hours.  Cardiac Enzymes No results for input(s): CKMB, TROPONINI, MYOGLOBIN in the last 168 hours.  Invalid input(s): CK  Invalid input(s): POCBNP  Urinalysis    Component Value Date/Time   COLORURINE YELLOW 11/29/2015 1440   APPEARANCEUR CLEAR 11/29/2015 1440   LABSPEC 1.028 11/29/2015 1440   PHURINE 5.0 11/29/2015 1440   GLUCOSEU NEGATIVE 11/29/2015 1440   HGBUR NEGATIVE 11/29/2015 1440   BILIRUBINUR SMALL* 11/29/2015 1440   KETONESUR NEGATIVE 11/29/2015 1440   PROTEINUR NEGATIVE 11/29/2015 1440   UROBILINOGEN 0.2 08/19/2015 0756   NITRITE NEGATIVE 11/29/2015 1440   LEUKOCYTESUR NEGATIVE 11/29/2015 1440    Imaging results:   Dg Chest 1 View  11/29/2015  CLINICAL DATA:  Fall. EXAM: CHEST 1 VIEW COMPARISON:  08/28/2015 FINDINGS: Lungs are clear. Heart and mediastinum are within normal limits. Negative for a pneumothorax. Lucency underneath the left  hemidiaphragm is most compatible with gaseous distension of the stomach.  Atherosclerotic calcifications at the aortic arch. IMPRESSION: No active disease. Electronically Signed   By: Richarda Overlie M.D.   On: 11/29/2015 15:00   Ct Head Wo Contrast  11/29/2015  CLINICAL DATA:  Altered mental status. Weakness with dehydration and hip pain for 2 days. EXAM: CT HEAD WITHOUT CONTRAST TECHNIQUE: Contiguous axial images were obtained from the base of the skull through the vertex without intravenous contrast. COMPARISON:  PET-CT 03/01/2015. FINDINGS: There is some artifact related to the patient's earrings and necklace which she would not allow to be removed for the examination. Brain: There is no evidence of acute intracranial hemorrhage, mass lesion, brain edema or extra-axial fluid collection. The ventricles and subarachnoid spaces are appropriately sized for age. There is no CT evidence of acute cortical infarction. Stable mild periventricular white matter disease, likely due to chronic small vessel ischemic changes. Intracranial vascular calcifications are noted. Bones/sinuses/visualized face: There is minimal fluffy opacity in the left division of the sphenoid sinus. The visualized paranasal sinuses, mastoid air cells and middle ears are otherwise clear. The calvarium is intact. IMPRESSION: No acute findings. Stable periventricular white matter disease, likely due to chronic small vessel ischemic changes. Electronically Signed   By: Carey Bullocks M.D.   On: 11/29/2015 15:13   Dg Hip Unilat With Pelvis 2-3 Views Left  11/29/2015  CLINICAL DATA:  Left hip pain after falling.  Confusion. EXAM: DG HIP (WITH OR WITHOUT PELVIS) 2-3V LEFT COMPARISON:  Radiographs 08/18/2015. FINDINGS: Status post dynamic screw and intramedullary nail fixation of the previously demonstrated comminuted left intertrochanteric femur fracture. The hardware is intact without displacement or loosening. The intertrochanteric fracture  demonstrates interval partial healing. No new fracture or dislocation identified. The bones are demineralized. Degenerative changes are present at both hips and in the lower lumbar spine. IMPRESSION: Status post or ORIF of left intertrochanteric femur fracture. No acute osseous findings identified. Electronically Signed   By: Carey Bullocks M.D.   On: 11/29/2015 15:04     EKG: (Independently reviewed)  Sinus tachycardia with ventricular rate 110 bpm, QTC 402 ms, peaked T waves as well as peaked P waves, voltage criteria for LVH, no ischemic changes.   Assessment & Plan  Principal Problem:   Acute renal failure / Hyperkalemia -Suspect secondary to volume depletion likely from recent acute exacerbation of therapy of symptoms and nausea vomiting; made worse by concomitant use of ACE inhibitor -Admit to stepdown/Obs -Give 1 and D50, 1 amp calcium chloride and 10 units IV insulin -Given 1 L of fluids in the ER-we'll give an additional liter and then continue IV fluid resuscitation keeping in mind patient does have a history of dilated cardiomyopathy -Repeat electrolytes at 8 PM tonight and again in a.m. -Hold ACE inhibitor  Active Problems:   Dehydration with hyponatremia -As above -Maintenance fluid at 100 mL per hour but monitor for volume overload and known history of heart failure    Elevated lactic acid level -Secondary to poor perfusion in setting of dehydration, hypotension, renal failure and hypothermia -Suspect concomitant use of antihypertensive medicine (ACE inhibitor) while volume depleted and likely with undetected hypotension contributed to ATN pattern -No obvious signs of infection and at this juncture do not suspect sepsis etiology therefore we'll discontinue antibiotics -Cycle lactic acid until has trended downward/normalized    Congestive dilated cardiomyopathy/Chronic combined systolic and diastolic CHF, NYHA class 2/Mitral regurgitation -Currently compensated secondary to  volume depletion -Follow closely -ACE inhibitor on hold -Because blood pressure remains suboptimal we'll hold carvedilol for  now -Was not on diuretic prior to admission    Agitation/Dementia -Patient became quite agitated at the prospect of being admitted to the hospital and cleared she would not stay -Based on my interaction with the patient and an apparent underlying diagnosis of dementia she does not have the capacity or insight to make this determination at this juncture and son agrees -Have given a one-time dose of Ativan and have provided prn Haldol (QTc 402 ms) -Uncertain at this juncture if patient is safe to return to home -When discussing CODE STATUS with son he did directly address resuscitation with his mother and she clearly stated that she would wish to be resuscitated but again unsure if she has appropriate insight into just what that means -Case management consulted to assist with discharge disposition appropriateness -PT/OT evaluation -During prior hospitalization patient has required transient skilled nursing facility stay for rehabilitation    Tobacco abuse/Severe protein-calorie malnutrition /COPD -According to son patient continues to smoke heavily -Chest x-ray without evidence of mass-consider repeat chest x-ray after hydration especially if developed hypoxemia -Currently no evidence of exacerbation of COPD -Nutrition consultation -Continue ensure    HTN  -Current blood pressures are suboptimal and in the hypertensive range for this patient -Preadmission medications on hold as above     DVT Prophylaxis: Subcutaneous heparin  Family Communication:   Spoke at length with son at bedside  Code Status:  Full code for now-discussed at length with son who has deferred this decision to his mother  Condition:  Guarded  Discharge disposition: Pending PT/OT evaluation patient could discharge to skilled nursing facility-given her underlying dementia and lack of insight  and apparent lack of capacity patient may need formal psychiatric evaluation before discharge to determine actual capacity  Time spent in minutes : 60      Magdaleno Lortie L. ANP on 11/29/2015 at 5:24 PM  You may contact me by going to [email or web address] - password [email or web address]  I am available from 7a-7p but please confirm I am on the schedule by going to Amion as above.   After 7p please contact night coverage person covering me after hours  [facility]

## 2015-11-29 NOTE — ED Provider Notes (Signed)
CSN: [id number]     Arrival date & time 11/29/15  1212 History   First MD Initiated Contact with Patient 11/29/15 1408     Chief Complaint  Patient presents with  . Weakness  . Hip Pain     (Consider location/radiation/quality/duration/timing/severity/associated sxs/prior Treatment) HPI Comments: Tammy [name] is a 75 y.o. female with a PMHx of insomnia, HTN, anemia, and dementia, with a PSHx of L IM nail placed 08/19/15, who presents to the ED coming from home (lives alone) accompanied by her son (who visits patient regularly), with complaints of generalized weakness 2 days. Level V caveat due to dementia. Her son gives most of the history. Son reports that over the last 1-2 days patient has been complaining of nausea, generalized weakness, and left hip pain (although pt denies nausea and hip pain to me). She has not had any recent falls, but her son states that she seems more confused today than she normally is, and that she has not been drinking her Ensures like she usually does although states that she has been drinking water regularly. He states that upon arrival when they were in triage she had a ? Presyncopal episode when they sat her up, unsure if she lost consciousness. Patient denies feeling lightheaded when she stood up, but this is with the son is describing. Triage nurse also reporting that it seemed that she had a presyncopal episode. Overall her son feels that she is dehydrated. He also states she has a chronic cough from being a smoker. She states she feels fatigued and tired.  She denies any fevers, chills, chest pain, shortness breath, ongoing nausea, vomiting, diarrhea, constipation, melena, hematochezia, dysuria, hematuria, numbness, tingling, or hip pain. No rashes.   Patient is a 75 y.o. female presenting with weakness and hip pain. The history is provided by the patient, medical records and a relative. The history is limited by the condition of the patient. No language  interpreter was used.  Weakness This is a recurrent problem. The current episode started yesterday. The problem occurs constantly. The problem has been unchanged. Associated symptoms include arthralgias (L hip, according to her son), coughing (chronic, unchanged), fatigue, nausea (according to son) and weakness (generalized). Pertinent negatives include no abdominal pain, chest pain, chills, fever, rash, urinary symptoms or vomiting. The symptoms are aggravated by standing. She has tried nothing for the symptoms. The treatment provided no relief.  Hip Pain Associated symptoms include arthralgias (L hip, according to her son), coughing (chronic, unchanged), fatigue, nausea (according to son) and weakness (generalized). Pertinent negatives include no abdominal pain, chest pain, chills, fever, rash, urinary symptoms or vomiting.    Past Medical History  Diagnosis Date  . Insomnia   . Hypertension    Past Surgical History  Procedure Laterality Date  . Intramedullary (im) nail intertrochanteric Left 08/19/2015    Procedure: INTRAMEDULLARY (IM) NAIL INTERTROCHANTRIC;  Surgeon: Jodi Geralds, MD;  Location: [facility] OR;  Service: Orthopedics;  Laterality: Left;   Family History  Problem Relation Age of Onset  . Congestive Heart Failure Son 39   Social History  Substance Use Topics  . Smoking status: Current Every Day Smoker    Types: Cigarettes  . Smokeless tobacco: None  . Alcohol Use: No   OB History    No data available      LEVEL 5 CAVEAT: due to dementia Review of Systems  Unable to perform ROS: Dementia  Constitutional: Positive for appetite change (decreased PO intake) and fatigue. Negative for fever  and chills.  Respiratory: Positive for cough (chronic, unchanged). Negative for shortness of breath.   Cardiovascular: Negative for chest pain.  Gastrointestinal: Positive for nausea (according to son). Negative for vomiting, abdominal pain, diarrhea, constipation and blood in stool.    Genitourinary: Negative for dysuria and hematuria.  Musculoskeletal: Positive for arthralgias (L hip, according to her son).  Skin: Negative for rash.  Neurological: Positive for syncope (unsure, possible, pt denies and son unsure, nursing reporting possible LOC when pt sat up in triage), weakness (generalized) and light-headedness (?lightheadedness with position change, ?presyncope in triage).  Hematological: Does not bruise/bleed easily.  Psychiatric/Behavioral: Positive for confusion (per son, more confused than normal today).      Allergies  Review of patient's allergies indicates no known allergies.  Home Medications   Prior to Admission medications   Medication Sig Start Date End Date Taking? Authorizing Provider  acetaminophen (TYLENOL) 500 MG tablet Take 2 tablets (1,000 mg total) by mouth every 8 (eight) hours. For 5 more days from 08/22/15 and then change to prn 08/22/15   Maretta Bees, MD  albuterol (PROVENTIL) (2.5 MG/3ML) 0.083% nebulizer solution Take 3 mLs (2.5 mg total) by nebulization every 4 (four) hours as needed for wheezing or shortness of breath. 03/04/15   Kinnie Scales Rinehuls, PA-C  aspirin EC 325 MG tablet Take 1 tablet (325 mg total) by mouth 2 (two) times daily after a meal. Take x 1 month post op to decrease risk of blood clots. 08/22/15   Shanker Levora Dredge, MD  carvedilol (COREG) 3.125 MG tablet Take 1 tablet (3.125 mg total) by mouth 2 (two) times daily with a meal. 03/04/15   David L Rinehuls, PA-C  feeding supplement, ENSURE ENLIVE, (ENSURE ENLIVE) LIQD Take 237 mLs by mouth 2 (two) times daily between meals. 08/22/15   Shanker Levora Dredge, MD  ferrous sulfate 325 (65 FE) MG tablet Take 1 tablet (325 mg total) by mouth 2 (two) times daily with a meal. 08/22/15   Shanker Levora Dredge, MD  lisinopril (PRINIVIL,ZESTRIL) 2.5 MG tablet Take 1 tablet (2.5 mg total) by mouth daily. 03/04/15   David L Rinehuls, PA-C  ondansetron (ZOFRAN) 4 MG tablet Take 1 tablet (4 mg  total) by mouth every 6 (six) hours as needed for nausea. 08/22/15   Shanker Levora Dredge, MD  oxyCODONE (ROXICODONE) 5 MG immediate release tablet Take 1 tablet (5 mg total) by mouth every 6 (six) hours as needed for severe pain. 08/22/15   Shanker Levora Dredge, MD  pantoprazole (PROTONIX) 40 MG tablet Take 1 tablet (40 mg total) by mouth daily. 03/04/15   David L Rinehuls, PA-C  polyethylene glycol (MIRALAX / GLYCOLAX) packet Take 17 g by mouth daily as needed for mild constipation. 08/22/15   Shanker Levora Dredge, MD  senna-docusate (SENOKOT-S) 8.6-50 MG tablet Take 2 tablets by mouth at bedtime. 08/22/15   Shanker Levora Dredge, MD  sertraline (ZOLOFT) 50 MG tablet Take 50 mg by mouth daily. 07/28/15   Historical Provider, MD   BP 98/59 mmHg  Pulse 112  Temp(Src) 96.8 F (36 C) (Core (Comment))  Resp 19  SpO2 100% Physical Exam  Constitutional: She is oriented to person, place, and time. She appears cachectic. She is easily aroused.  Non-toxic appearance. She has a sickly appearance. No distress.  Afebrile, nontoxic, NAD but overall sickly and frail appearing, very cachectic, appears much older than stated age. Sleeping during exam, easily arousable but falls back to sleep intermittently throughout exam.   HENT:  Head: Normocephalic and atraumatic.  Mouth/Throat: Oropharynx is clear and moist. Mucous membranes are dry.  Very dry mucous membranes  Eyes: Conjunctivae and EOM are normal. Pupils are equal, round, and reactive to light. Right eye exhibits no discharge. Left eye exhibits no discharge.  Neck: Normal range of motion. Neck supple.  Cardiovascular: Regular rhythm, normal heart sounds and intact distal pulses.  Tachycardia present.  Exam reveals no gallop and no friction rub.   No murmur heard. Pulses:      Dorsalis pedis pulses are 1+ on the right side, and 1+ on the left side.  Mildly tachycardic in the low 100s during exam, reg rhythm, nl s1/s2, no m/r/g, distal pulses intact although faint  in the DP pulses bilaterally, no pedal edema  Pulmonary/Chest: Effort normal and breath sounds normal. No respiratory distress. She has no decreased breath sounds. She has no wheezes. She has no rhonchi. She has no rales.  Poor inspiratory effort but overall CTAB in all lung fields, no w/r/r, no hypoxia or increased WOB, SpO2 100% on RA   Abdominal: Soft. Normal appearance and bowel sounds are normal. She exhibits no distension. There is no tenderness. There is no rigidity, no rebound, no guarding, no CVA tenderness, no tenderness at McBurney's point and negative Murphy's sign.  Soft, sunken abdomen, NTND, +BS throughout, no r/g/r, neg murphy's, neg mcburney's, no CVA TTP   Musculoskeletal: Normal range of motion.       Left hip: She exhibits tenderness. She exhibits normal range of motion, no swelling, no crepitus and no deformity.       Legs: MAE x4 Strength and sensation grossly intact and at baseline Distal pulses intact No pedal edema L hip with very mild TTP along lateral aspect of hip/proximal thigh, with FROM intact, no limb length discrepancy, no abnormal rotation, no crepitus or deformity, no swelling or bruising.  Neurological: She is oriented to person, place, and time and easily aroused. She has normal strength. No sensory deficit.  No obvious focal neuro deficits although pt not very cooperative with exam, oriented x3, falling asleep during exam but easily arousable.   Skin: Skin is warm, dry and intact. No rash noted.  Psychiatric: She has a normal mood and affect.  Nursing note and vitals reviewed.   ED Course  Procedures (including critical care time)  CRITICAL CARE- sepsis, hypothermic, hyperkalemia Performed by: Ramond Marrow   Total critical care time: 60 minutes  Critical care time was exclusive of separately billable procedures and treating other patients.  Critical care was necessary to treat or prevent imminent or life-threatening  deterioration.  Critical care was time spent personally by me on the following activities: development of treatment plan with patient and/or surrogate as well as nursing, discussions with consultants, evaluation of patient's response to treatment, examination of patient, obtaining history from patient or surrogate, ordering and performing treatments and interventions, ordering and review of laboratory studies, ordering and review of radiographic studies, pulse oximetry and re-evaluation of patient's condition.  Labs Review Labs Reviewed  COMPREHENSIVE METABOLIC PANEL - Abnormal; Notable for the following:    Sodium 133 (*)    Potassium 6.3 (*)    CO2 11 (*)    Glucose, Bld 119 (*)    BUN 69 (*)    Creatinine, Ser 2.51 (*)    Total Protein 8.3 (*)    GFR calc non Af Amer 18 (*)    GFR calc Af Amer 20 (*)    Anion gap 17 (*)  All other components within normal limits  URINALYSIS, ROUTINE W REFLEX MICROSCOPIC (NOT AT [facility]) - Abnormal; Notable for the following:    Bilirubin Urine SMALL (*)    All other components within normal limits  CBG MONITORING, ED - Abnormal; Notable for the following:    Glucose-Capillary 102 (*)    All other components within normal limits  I-STAT CG4 LACTIC ACID, ED - Abnormal; Notable for the following:    Lactic Acid, Venous 3.91 (*)    All other components within normal limits  URINE CULTURE  CULTURE, BLOOD (ROUTINE X 2)  CULTURE, BLOOD (ROUTINE X 2)  CBC  DIFFERENTIAL  I-STAT TROPOININ, ED  I-STAT CG4 LACTIC ACID, ED    Imaging Review Dg Chest 1 View  11/29/2015  CLINICAL DATA:  Fall. EXAM: CHEST 1 VIEW COMPARISON:  08/28/2015 FINDINGS: Lungs are clear. Heart and mediastinum are within normal limits. Negative for a pneumothorax. Lucency underneath the left hemidiaphragm is most compatible with gaseous distension of the stomach. Atherosclerotic calcifications at the aortic arch. IMPRESSION: No active disease. Electronically Signed   By: Richarda Overlie  M.D.   On: 11/29/2015 15:00   Ct Head Wo Contrast  11/29/2015  CLINICAL DATA:  Altered mental status. Weakness with dehydration and hip pain for 2 days. EXAM: CT HEAD WITHOUT CONTRAST TECHNIQUE: Contiguous axial images were obtained from the base of the skull through the vertex without intravenous contrast. COMPARISON:  PET-CT 03/01/2015. FINDINGS: There is some artifact related to the patient's earrings and necklace which she would not allow to be removed for the examination. Brain: There is no evidence of acute intracranial hemorrhage, mass lesion, brain edema or extra-axial fluid collection. The ventricles and subarachnoid spaces are appropriately sized for age. There is no CT evidence of acute cortical infarction. Stable mild periventricular white matter disease, likely due to chronic small vessel ischemic changes. Intracranial vascular calcifications are noted. Bones/sinuses/visualized face: There is minimal fluffy opacity in the left division of the sphenoid sinus. The visualized paranasal sinuses, mastoid air cells and middle ears are otherwise clear. The calvarium is intact. IMPRESSION: No acute findings. Stable periventricular white matter disease, likely due to chronic small vessel ischemic changes. Electronically Signed   By: Carey Bullocks M.D.   On: 11/29/2015 15:13   Dg Hip Unilat With Pelvis 2-3 Views Left  11/29/2015  CLINICAL DATA:  Left hip pain after falling.  Confusion. EXAM: DG HIP (WITH OR WITHOUT PELVIS) 2-3V LEFT COMPARISON:  Radiographs 08/18/2015. FINDINGS: Status post dynamic screw and intramedullary nail fixation of the previously demonstrated comminuted left intertrochanteric femur fracture. The hardware is intact without displacement or loosening. The intertrochanteric fracture demonstrates interval partial healing. No new fracture or dislocation identified. The bones are demineralized. Degenerative changes are present at both hips and in the lower lumbar spine. IMPRESSION:  Status post or ORIF of left intertrochanteric femur fracture. No acute osseous findings identified. Electronically Signed   By: Carey Bullocks M.D.   On: 11/29/2015 15:04   I have personally reviewed and evaluated these images and lab results as part of my medical decision-making.   EKG Interpretation   Date/Time:  Thursday November 29 2015 13:38:37 EST Ventricular Rate:  110 PR Interval:  162 QRS Duration: 80 QT Interval:  297 QTC Calculation: 402 R Axis:   83 Text Interpretation:  Sinus tachycardia RAE, consider biatrial enlargement  Anterior infarct, old Confirmed by COOK  MD, BRIAN ([id number]) on 11/29/2015  2:26:33 PM      MDM  Final diagnoses:  Confusion  Hypothermia, initial encounter  Dehydration  Tachycardia  Elevated lactic acid level  Hyperkalemia  AKI (acute kidney injury) (HCC)  Hyponatremia    75 y.o. female here with generalized weakness, dehydration, presyncopal episode in triage when she went from sitting to standing. Son reports she's also been complaining of L hip pain (although she denies this to me). Poor PO intake recently. Some nausea reported through her son, but she does not report this to me. Son states she's more confused/sleepy than normal, but pt is oriented x3. Falls asleep during exam. Very dry mucous membranes and skin, mildly tachycardic in the low 100s, BP 98/59 which has been seen in prior ER visits but does seem like it could be from dehydration. All extremities NVI with soft compartments, pt very cachectic and frail, with mild tenderness to L lateral hip. Will obtain xray of hip and CXR to eval since she also has a chronic cough. Will also get CT head to ensure no stroke (pt outside of TPA window since symptoms have been ongoing x1 day). CBG 102 on arrival. EKG with sinus tachy and RAE, similar to prior but with more peaked T waves. Will get labs, lactic, trop, and U/A with cultures. Will give fluids and then reassess. Will get temp as well, since  this is missing from her vitals. She likely needs admission for dehydration. My attending Dr. Adriana Simas saw pt and agrees with plan thus far. Will monitor closely and reassess shortly  2:42 PM Core temp 96.8, will activate code sepsis and place bair hugger on pt.   2:50 PM Lactic 3.91, awaiting remaining work up. Code sepsis called. Will start empiric abx. Trop neg. U/A pending, remaining work up pending.  3:17 PM CBC w/diff unremarkable. CT head neg. Xray L hip neg for acute findings. CXR neg. Awaiting U/A and CMP. Of note, son is not at bedside now, and pt is repeatedly stating she doesn't want to stay and be admitted, but Dr. Adriana Simas and I both feel that she can't adequately make that decision given her dementia. Will see if son returns. Will continue to monitor.   3:37 PM U/A without evidence of infection. CMP still pending, but will proceed with admission. Her son has still not yet returned, but it was discussed with him before he left that the possibility of admission was there. Given that I don't feel she has adequate capacity to make a decision about leaving AMA, will proceed with admission.  3:57 PM CMP resulting, showing Na 133, K 6.3, bicarb 11, anion gap 17, Cr 2.51 (up from baseline of 0.8-1). Tammy Silk NP for triad returning page, will admit to step down. Please see her notes for further documentation of care.  BP 98/59 mmHg  Pulse 112  Temp(Src) 96.8 F (36 C) (Core (Comment))  Resp 19  Ht 5' 5.5" (1.664 m)  Wt 45.4 kg  BMI 16.40 kg/m2  SpO2 100%  Meds ordered this encounter  Medications  . sodium chloride 0.9 % bolus 1,000 mL    Sig:     Order Specific Question:  Enter Patient Weight in Kilograms    Answer:  45  . piperacillin-tazobactam (ZOSYN) IVPB 3.375 g    Sig:     Order Specific Question:  Antibiotic Indication:    Answer:  Sepsis  . vancomycin (VANCOCIN) IVPB 1000 mg/200 mL premix    Sig:     Order Specific Question:  Indication:    Answer:  Sepsis  .  dextrose 50 % solution 25 mL    Sig:   . insulin aspart (novoLOG) injection 10 Units    Sig:   . calcium gluconate 1 g in sodium chloride 0.9 % 100 mL IVPB    Sig:   . sodium chloride 0.9 % bolus 1,000 mL    Sig:   . 0.9 %  sodium chloride infusion    Sig:      Tammy Hevener Camprubi-Soms, PA-C 11/29/15 1558  Donnetta Hutching, MD 12/01/15 [phone]

## 2015-11-29 NOTE — ED Notes (Signed)
Pt here for weakness, dehydration, hip pain. sts x 2 days.

## 2015-11-29 NOTE — ED Notes (Signed)
Lab called with critical potassium 6.3 

## 2015-11-29 NOTE — Consult Note (Signed)
Pharmacy Antibiotic Note  Tammy [name] is a 75 y.o. female admitted on 11/29/2015 with sepsis.  Pharmacy has been consulted for vanc/zosyn dosing.   Plan: Vanc 1g IV x1 Zosyn 3.375 g IV over 30 min x1    Height: 5' 5.5" (166.4 cm) Weight: 100 lb 1.4 oz (45.4 kg) IBW/kg (Calculated) : 58.15  Temp (24hrs), Avg:96.8 F (36 C), Min:96.8 F (36 C), Max:96.8 F (36 C)   Recent Labs Lab 11/29/15 1418 11/29/15 1435  WBC 4.6  --   LATICACIDVEN  --  3.91*    CrCl cannot be calculated (Patient has no serum creatinine result on file.).    No Known Allergies  Antimicrobials this admission: Vanc 2/23 >> 2/23 Zosyn 2/23 >> 2/23  Dose adjustments this admission: n/a  Microbiology results: 2/23 BCx:  2/23 UCx:    Thank you for allowing pharmacy to be a part of this patient's care.  Greggory Stallion, PharmD Clinical Pharmacy Resident Pager # [phone] 11/29/2015 5:32 PM    Pharmacy Code Sepsis Protocol  Time of code sepsis page: unknown  Antibiotics delivered at 15:02  Were antibiotics ordered at the time of the code sepsis page? Yes Was it required to contact the physician?  Physician not contacted  Physician contacted to order antibiotics for code sepsis  Physician contacted to recommend changing antibiotics  Pharmacy consulted for: vanc/zosyn  Anti-infectives    Start     Dose/Rate Route Frequency Ordered Stop   11/29/15 1500  piperacillin-tazobactam (ZOSYN) IVPB 3.375 g     3.375 g 100 mL/hr over 30 Minutes Intravenous  Once 11/29/15 1452     11/29/15 1500  vancomycin (VANCOCIN) IVPB 1000 mg/200 mL premix     1,000 mg 200 mL/hr over 60 Minutes Intravenous  Once 11/29/15 1452          Nurse education provided:  Minutes left to administer antibiotics to achieve 1 hour goal  Correct order of antibiotic administration  Antibiotic Y-site compatibilities     Greggory Stallion, PharmD Clinical Pharmacy Resident Pager # [phone] 11/29/2015 3:27  PM

## 2015-11-30 DIAGNOSIS — E43 Unspecified severe protein-calorie malnutrition: Secondary | ICD-10-CM | POA: Diagnosis not present

## 2015-11-30 DIAGNOSIS — I420 Dilated cardiomyopathy: Secondary | ICD-10-CM | POA: Diagnosis not present

## 2015-11-30 DIAGNOSIS — N179 Acute kidney failure, unspecified: Secondary | ICD-10-CM | POA: Diagnosis not present

## 2015-11-30 DIAGNOSIS — I5042 Chronic combined systolic (congestive) and diastolic (congestive) heart failure: Secondary | ICD-10-CM | POA: Diagnosis not present

## 2015-11-30 DIAGNOSIS — E872 Acidosis: Secondary | ICD-10-CM

## 2015-11-30 DIAGNOSIS — E875 Hyperkalemia: Secondary | ICD-10-CM | POA: Diagnosis not present

## 2015-11-30 DIAGNOSIS — R410 Disorientation, unspecified: Secondary | ICD-10-CM | POA: Diagnosis not present

## 2015-11-30 LAB — CBC
HCT: 33 % — ABNORMAL LOW (ref 36.0–46.0)
Hemoglobin: 11.4 g/dL — ABNORMAL LOW (ref 12.0–15.0)
MCH: 33.4 pg (ref 26.0–34.0)
MCHC: 34.5 g/dL (ref 30.0–36.0)
MCV: 96.8 fL (ref 78.0–100.0)
Platelets: 174 10*3/uL (ref 150–400)
RBC: 3.41 MIL/uL — ABNORMAL LOW (ref 3.87–5.11)
RDW: 14.5 % (ref 11.5–15.5)
WBC: 11.8 10*3/uL — ABNORMAL HIGH (ref 4.0–10.5)

## 2015-11-30 LAB — COMPREHENSIVE METABOLIC PANEL
ALT: 12 U/L — ABNORMAL LOW (ref 14–54)
AST: 22 U/L (ref 15–41)
Albumin: 3.5 g/dL (ref 3.5–5.0)
Alkaline Phosphatase: 52 U/L (ref 38–126)
Anion gap: 14 (ref 5–15)
BUN: 59 mg/dL — ABNORMAL HIGH (ref 6–20)
CO2: 11 mmol/L — ABNORMAL LOW (ref 22–32)
Calcium: 9.3 mg/dL (ref 8.9–10.3)
Chloride: 112 mmol/L — ABNORMAL HIGH (ref 101–111)
Creatinine, Ser: 1.65 mg/dL — ABNORMAL HIGH (ref 0.44–1.00)
GFR calc Af Amer: 34 mL/min — ABNORMAL LOW (ref >60)
GFR calc non Af Amer: 29 mL/min — ABNORMAL LOW (ref >60)
Glucose, Bld: 76 mg/dL (ref 65–99)
Potassium: 4.7 mmol/L (ref 3.5–5.1)
Sodium: 137 mmol/L (ref 135–145)
Total Bilirubin: 0.7 mg/dL (ref 0.3–1.2)
Total Protein: 6.6 g/dL (ref 6.5–8.1)

## 2015-11-30 LAB — URINE CULTURE: Culture: NO GROWTH

## 2015-11-30 LAB — BASIC METABOLIC PANEL
Anion gap: 7 (ref 5–15)
BUN: 40 mg/dL — ABNORMAL HIGH (ref 6–20)
CO2: 16 mmol/L — ABNORMAL LOW (ref 22–32)
Calcium: 8.6 mg/dL — ABNORMAL LOW (ref 8.9–10.3)
Chloride: 117 mmol/L — ABNORMAL HIGH (ref 101–111)
Creatinine, Ser: 0.96 mg/dL (ref 0.44–1.00)
GFR calc Af Amer: >60 mL/min (ref >60)
GFR calc non Af Amer: 56 mL/min — ABNORMAL LOW (ref >60)
Glucose, Bld: 118 mg/dL — ABNORMAL HIGH (ref 65–99)
Potassium: 3.7 mmol/L (ref 3.5–5.1)
Sodium: 140 mmol/L (ref 135–145)

## 2015-11-30 LAB — LACTIC ACID, PLASMA
Lactic Acid, Venous: 2.9 mmol/L (ref 0.5–2.0)
Lactic Acid, Venous: 3.2 mmol/L (ref 0.5–2.0)
Lactic Acid, Venous: 1.8 mmol/L (ref 0.5–2.0)

## 2015-11-30 LAB — MRSA PCR SCREENING: MRSA by PCR: NEGATIVE

## 2015-11-30 MED ORDER — SODIUM CHLORIDE 0.9 % IV BOLUS (SEPSIS): 1000.0000 mL | INTRAVENOUS | Status: DC | PRN

## 2015-11-30 MED ORDER — SODIUM CHLORIDE 0.9 % IV BOLUS (SEPSIS)
1000.0000 mL | Freq: Once | INTRAVENOUS | Status: AC
  Administered 2015-11-30: 1000 mL via INTRAVENOUS

## 2015-11-30 MED ORDER — SODIUM BICARBONATE 8.4 % IV SOLN
INTRAVENOUS | Status: DC
  Administered 2015-11-30 (×2): via INTRAVENOUS
  Filled 2015-11-30 (×5): qty 1000

## 2015-11-30 NOTE — Care Management Obs Status (Signed)
MEDICARE OBSERVATION STATUS NOTIFICATION   Patient Details  Name: Tammy [name] MRN: [id number] Date of Birth: 1940/12/19   Medicare Observation Status Notification Given:  Yes    Leone Haven, RN 11/30/2015, 1:32 PM

## 2015-11-30 NOTE — Progress Notes (Signed)
Initial Nutrition Assessment  DOCUMENTATION CODES:   Not applicable  INTERVENTION:    Ensure Enlive po TID, each supplement provides 350 kcal and 20 grams of protein  NUTRITION DIAGNOSIS:   Inadequate oral intake related to other (see comment) (advanced age, likely poor appetite) as evidenced by other (see comment) (reported hx of FTT, severe PCM, and recent poor oral intake PTA).  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  Patient reports that she has been eating "just fine." "I am 75 years old and I'll eat what I want." She would not agree for RD to complete nutrition focused physical exam. She likes Ensure, stating, "I have a whole case of that at home."  Diet Order:  DIET SOFT Room service appropriate?: Yes; Fluid consistency:: Thin  Skin:  Reviewed, no issues  Last BM:  unknown  Height:   Ht Readings from Last 1 Encounters:  11/30/15  (1.676 m)    Weight:   Wt Readings from Last 1 Encounters:  11/30/15 102 lb 8.2 oz (46.5 kg)    Ideal Body Weight:  59.1 kg  BMI:  Body mass index is 16.55 kg/(m^2).  Estimated Nutritional Needs:   Kcal:  1300-1500  Protein:  70-80 gm  Fluid:  1.5 L  EDUCATION NEEDS:   No education needs identified at this  time  Joaquin Courts, RD, LDN, CNSC Pager [phone] After Hours Pager [phone]

## 2015-11-30 NOTE — Care Management Note (Signed)
Case Management Note  Patient Details  Name: Tammy [name] MRN: [id number] Date of Birth: 19-Feb-1941  Subjective/Objective:  Patient lives alone, her son , Casimiro Needle takes care of her but he works.  Per pt eval rec SNF, CSW referral.  NCM will cont to follow for dc needs.                   Action/Plan:   Expected Discharge Date:                  Expected Discharge Plan:  Skilled Nursing Facility  In-House Referral:  Clinical Social Work  Discharge planning Services  CM Consult  Post Acute Care Choice:    Choice offered to:     DME Arranged:    DME Agency:     HH Arranged:    HH Agency:     Status of Service:  In process, will continue to follow  Medicare Important Message Given:    Date Medicare IM Given:    Medicare IM give by:    Date Additional Medicare IM Given:    Additional Medicare Important Message give by:     If discussed at Long Length of Stay Meetings, dates discussed:    Additional Comments:  Leone Haven, RN 11/30/2015, 4:26 PM

## 2015-11-30 NOTE — Progress Notes (Signed)
CRITICAL VALUE ALERT  Critical value received:  Lactic Acid 2.9  Date of notification:  11/30/15  Time of notification:  0758  Critical value read back:Yes.    Nurse who received alert:  Merry Lofty, RN  MD notified (1st page):  Dhungel  Time of first page:  [phone]  MD notified (2nd page):  Time of second page:  Responding MD:  Dhungel  Time MD responded:  0800

## 2015-11-30 NOTE — Progress Notes (Signed)
[facility] PROGRESS NOTE  Tammy [name] ZOX:[id number] DOB: 01-11-1941 DOA: 11/29/2015 PCP: Cala Bradford, MD Negative 75 year old female with history of CVA, COPD, ongoing tobacco use, command systolic and diastolic heart failure, hypertension, severe malnutrition and history of dementia who lives alone with her son visiting her daily and gives her medications was brought to the ED with several days a weakness poor by mouth intake and increasingly confused on the day of admission. In the ED she was mildly hypothermic with low normal blood pressure, tachycardic to 110s. Blood work done showed hyperkalemia with K of 6.3, acute kidney injury with BUN of 69 and 2.51, anion gap of 17 with metabolic acidosis, low bicarbonate of 9 and elevated lactic acid. Patient admitted to stepdown unit for further management.  Assessment/Plan: Acute kidney injury with anion gap metabolic acidosis Possibly due to dehydration. No source of sepsis. Patient mildly hypothermic in the ED and received a dose of vancomycin and Zosyn. Follow blood cultures. UA unremarkable. Renal function better this morning. Anion gap of 14 but still has low bicarbonate. Fluid changed to D5 weight grams of bicarbonate.  Lactic acidosis Possibly due to dehydration and acute kidney injury. No clinical signs or symptoms of sepsis. Getting IV fluid boluses. Will monitor closely. Not on any medications that would contribute. No clinical signs or symptoms of bowel ischemia. Follow blood culture. LFTs normal.  Dilated cardiomyopathy Patient is hypovolemic. Holding her blood pressure medications. Continue to monitor with IV fluids. Continue aspirin.  Agitation with possible underlying dementia On admission. May have been related to primary symptoms. Patient was somnolent during my evaluation this morning but was much alert and oriented during the afternoon (to baseline as per son).  Generalized weakness with poor by mouth intake PT  and dietitian consulted. Patient repeatedly expressed not wanting to go to skilled nursing facility.   Severe malnutrition On ensure supplements at home. Dietitian consulted.  COPD with ongoing tobacco use Continue when necessary albuterol  DVT prophylaxis: Subcutaneous heparin Diet: Soft  Code Status: Full code Family Communication: Son at bedside Disposition Plan: Continue stepdown monitoring. Possibly home with home health once stable   Consultants:  None  Procedures:  None  Antibiotics:  IV vancomycin and Zosyn 1 in the ED  HPI/Subjective: Seen and examined. Patient must oriented during the day, denies any specific symptoms.  Objective: Filed Vitals:   11/30/15 1030 11/30/15 1100  BP: 124/47 112/42  Pulse:    Temp:  98.4 F (36.9 C)  Resp: 17 16    Intake/Output Summary (Last 24 hours) at 11/30/15 1456 Last data filed at 11/30/15 1200  Gross per 24 hour  Intake 1766.66 ml  Output   1400 ml  Net 366.66 ml   Filed Weights   11/29/15 1500 11/30/15 0017 11/30/15 0514  Weight: 45.4 kg (100 lb 1.4 oz) 45.5 kg (100 lb 5 oz) 46.5 kg (102 lb 8.2 oz)    Exam:   General:  Elderly thin built female not in distress  HEENT: No pallor, dry mucosa, supple neck  Chest: Clear bilaterally  CVS: Normal S1 and S2, no murmurs rub or gallop  GI: Soft, nondistended, nontender, bowel sounds present  Musculoskeletal: On, no edema  CNS: Alert and oriented x2, nonfocal  Data Reviewed: Basic Metabolic Panel:  Recent Labs Lab 11/29/15 1418 11/29/15 2108 11/30/15 0550  NA 133* 134* 137  K 6.3* 4.5 4.7  CL 105 108 112*  CO2 11* 9* 11*  GLUCOSE 119* 59* 76  BUN 69*  65* 59*  CREATININE 2.51* 1.96* 1.65*  CALCIUM 10.1 9.4 9.3  MG  --  2.2  --   PHOS  --  4.8*  --    Liver Function Tests:  Recent Labs Lab 11/29/15 1418 11/30/15 0550  AST 38 22  ALT 15 12*  ALKPHOS 92 52  BILITOT 0.4 0.7  PROT 8.3* 6.6  ALBUMIN 4.5 3.5   No results for input(s):  LIPASE, AMYLASE in the last 168 hours. No results for input(s): AMMONIA in the last 168 hours. CBC:  Recent Labs Lab 11/29/15 1418 11/30/15 0550  WBC 4.6 11.8*  NEUTROABS 3.5  --   HGB 13.1 11.4*  HCT 38.8 33.0*  MCV 95.8 96.8  PLT 228 174   Cardiac Enzymes: No results for input(s): CKTOTAL, CKMB, CKMBINDEX, TROPONINI in the last 168 hours. BNP (last 3 results) No results for input(s): BNP in the last 8760 hours.  ProBNP (last 3 results) No results for input(s): PROBNP in the last 8760 hours.  CBG:  Recent Labs Lab 11/29/15 1338  GLUCAP 102*    Recent Results (from the past 240 hour(s))  Urine culture     Status: None   Collection Time: 11/29/15  2:40 PM  Result Value Ref Range Status   Specimen Description URINE, CATHETERIZED  Final   Special Requests NONE  Final   Culture NO GROWTH 1 DAY  Final   Report Status 11/30/2015 FINAL  Final  MRSA PCR Screening     Status: None   Collection Time: 11/30/15 12:30 AM  Result Value Ref Range Status   MRSA by PCR NEGATIVE NEGATIVE Final    Comment:        The GeneXpert MRSA Assay (FDA approved for NASAL specimens only), is one component of a comprehensive MRSA colonization surveillance program. It is not intended to diagnose MRSA infection nor to guide or monitor treatment for MRSA infections.      Studies: Dg Chest 1 View  11/29/2015  CLINICAL DATA:  Fall. EXAM: CHEST 1 VIEW COMPARISON:  08/28/2015 FINDINGS: Lungs are clear. Heart and mediastinum are within normal limits. Negative for a pneumothorax. Lucency underneath the left hemidiaphragm is most compatible with gaseous distension of the stomach. Atherosclerotic calcifications at the aortic arch. IMPRESSION: No active disease. Electronically Signed   By: Richarda Overlie M.D.   On: 11/29/2015 15:00   Ct Head Wo Contrast  11/29/2015  CLINICAL DATA:  Altered mental status. Weakness with dehydration and hip pain for 2 days. EXAM: CT HEAD WITHOUT CONTRAST TECHNIQUE:  Contiguous axial images were obtained from the base of the skull through the vertex without intravenous contrast. COMPARISON:  PET-CT 03/01/2015. FINDINGS: There is some artifact related to the patient's earrings and necklace which she would not allow to be removed for the examination. Brain: There is no evidence of acute intracranial hemorrhage, mass lesion, brain edema or extra-axial fluid collection. The ventricles and subarachnoid spaces are appropriately sized for age. There is no CT evidence of acute cortical infarction. Stable mild periventricular white matter disease, likely due to chronic small vessel ischemic changes. Intracranial vascular calcifications are noted. Bones/sinuses/visualized face: There is minimal fluffy opacity in the left division of the sphenoid sinus. The visualized paranasal sinuses, mastoid air cells and middle ears are otherwise clear. The calvarium is intact. IMPRESSION: No acute findings. Stable periventricular white matter disease, likely due to chronic small vessel ischemic changes. Electronically Signed   By: Carey Bullocks M.D.   On: 11/29/2015 15:13   Dg  Hip Unilat With Pelvis 2-3 Views Left  11/29/2015  CLINICAL DATA:  Left hip pain after falling.  Confusion. EXAM: DG HIP (WITH OR WITHOUT PELVIS) 2-3V LEFT COMPARISON:  Radiographs 08/18/2015. FINDINGS: Status post dynamic screw and intramedullary nail fixation of the previously demonstrated comminuted left intertrochanteric femur fracture. The hardware is intact without displacement or loosening. The intertrochanteric fracture demonstrates interval partial healing. No new fracture or dislocation identified. The bones are demineralized. Degenerative changes are present at both hips and in the lower lumbar spine. IMPRESSION: Status post or ORIF of left intertrochanteric femur fracture. No acute osseous findings identified. Electronically Signed   By: Carey Bullocks M.D.   On: 11/29/2015 15:04    Scheduled Meds: .  aspirin EC  325 mg Oral BID PC  . feeding supplement (ENSURE ENLIVE)  237 mL Oral TID BM  . ferrous sulfate  325 mg Oral BID WC  . heparin  5,000 Units Subcutaneous 3 times per day  . pantoprazole  40 mg Oral Daily  . senna-docusate  2 tablet Oral QHS  . sertraline  50 mg Oral Daily  . sodium chloride flush  3 mL Intravenous Q12H   Continuous Infusions: . dextrose 5 % 1,000 mL with sodium bicarbonate 100 mEq infusion 100 mL/hr at 11/30/15 1100      Time spent: 25 minutes    Anzal Bartnick  [facility] Pager [phone] 7PM-7AM, please contact night-coverage at [email or web address], password [facility] 11/30/2015, 2:56 PM

## 2015-11-30 NOTE — ED Notes (Signed)
Attempted to give pt oral meds. Pt would not attempt to swallow prior to administration. Admitting RN notified.

## 2015-11-30 NOTE — Evaluation (Signed)
Physical Therapy Evaluation Patient Details Name: Tammy [name] MRN: [id number] DOB: 05-12-1941 Today's Date: 11/30/2015   History of Present Illness  Pt is a 75 y/o F w/ h/o CVA, tobacco abuse, COPD, heart failure, dementia, malnutrition presents w/ AMS, failure to thrive.  Workup significant for acute renal failure secondary to poor oral intake and admitted for IV hydration.    Clinical Impression  Pt admitted with above diagnosis. Pt currently with functional limitations due to the deficits listed below (see PT Problem List). Tammy [name] presents w/ decreased awareness of deficits and safety and currently requires +2 assist for safe transfers and short distance ambulation.  She lives alone and has a h/o falls. Recommending SNF at d/c.  Pt will benefit from skilled PT to increase their independence and safety with mobility to allow discharge to the venue listed below.      Follow Up Recommendations SNF;Supervision/Assistance - 24 hour    Equipment Recommendations  Other (comment) (TBD at next venue of care)    Recommendations for Other Services       Precautions / Restrictions Precautions Precautions: Fall Restrictions Weight Bearing Restrictions: No      Mobility  Bed Mobility Overal bed mobility: Needs Assistance Bed Mobility: Supine to Sit     Supine to sit: Mod assist;HOB elevated     General bed mobility comments: Cues for hand placement and sequencing.  Pt uses bed rail and requires assist to elevate trunk.  Bed pad used for proper positioning.   Transfers Overall transfer level: Needs assistance Equipment used: Rolling walker (2 wheeled) Transfers: Sit to/from Stand Sit to Stand: Mod assist;+2 physical assistance;+2 safety/equipment         General transfer comment: 2 person HHA, pt not initiating stand due to fear of falling.  Pt braces back of Bil LEs against bed to stand and requires assist to boost up and steady.    Ambulation/Gait Ambulation/Gait  assistance: Mod assist;+2 physical assistance;+2 safety/equipment Ambulation Distance (Feet): 4 Feet Assistive device: 2 person hand held assist Gait Pattern/deviations: Shuffle;Antalgic;Narrow base of support;Staggering left;Staggering right   Gait velocity interpretation: <1.8 ft/sec, indicative of risk for recurrent falls General Gait Details: 2 person HHA to steady and to maintain upright as pt demonstrates flexed posture.  Cues for sequencing her steps to ambulate to chair.    Stairs            Wheelchair Mobility    Modified Rankin (Stroke Patients Only)       Balance Overall balance assessment: Needs assistance;History of Falls (says she fell last week when her friends was at the house) Sitting-balance support: Bilateral upper extremity supported;Feet supported Sitting balance-Leahy Scale: Fair Sitting balance - Comments: Close min guard assist sitting EOB for ~5 minutes   Standing balance support: Bilateral upper extremity supported;During functional activity Standing balance-Leahy Scale: Poor Standing balance comment: Relies on Bil UE support                             Pertinent Vitals/Pain Pain Assessment: Faces Faces Pain Scale: Hurts even more Pain Location: Bil great toe toenail Pain Descriptors / Indicators: Tender Pain Intervention(s): Limited activity within patient's tolerance;Monitored during session    Home Living Family/patient expects to be discharged to:: Private residence Living Arrangements: Alone Available Help at Discharge: Family;Available PRN/intermittently Type of Home: House Home Access: Level entry     Home Layout: One level Home Equipment: Walker - 2 wheels;Wheelchair - [employer] -  single point Additional Comments: Information taken from pt, unclear of reliability    Prior Function Level of Independence: Needs assistance   Gait / Transfers Assistance Needed: Pt reports she ambulates w/ her RW w/o assist  ADL's /  Homemaking Assistance Needed: Needs assist from her son to assist her w/ steadying while standing in shower.  She reports that she is able to dress on her own.        Hand Dominance   Dominant Hand: Right    Extremity/Trunk Assessment   Upper Extremity Assessment: Generalized weakness           Lower Extremity Assessment: Generalized weakness      Cervical / Trunk Assessment: Kyphotic  Communication   Communication: No difficulties  Cognition Arousal/Alertness: Awake/alert Behavior During Therapy: Agitated;Anxious (anxious about falling) Overall Cognitive Status: No family/caregiver present to determine baseline cognitive functioning Area of Impairment: Orientation;Attention;Memory;Following commands;Safety/judgement;Awareness;Problem solving Orientation Level: Disoriented to;Situation;Time ("my idiot son brought me here for no reason") Current Attention Level: Selective Memory: Decreased short-term memory Following Commands: Follows multi-step commands inconsistently Safety/Judgement: Decreased awareness of safety;Decreased awareness of deficits Awareness: Emergent Problem Solving: Slow processing;Decreased initiation;Difficulty sequencing;Requires verbal cues;Requires tactile cues General Comments: Does not know the day of the week.  Is very please w/ PT one moment saying, "you're so nice" and the next moment saying, "you guys are really starting to piss me off"    General Comments      Exercises        Assessment/Plan    PT Assessment Patient needs continued PT services  PT Diagnosis Difficulty walking;Generalized weakness   PT Problem List Decreased strength;Decreased activity tolerance;Decreased balance;Decreased mobility;Decreased cognition;Decreased knowledge of use of DME;Decreased safety awareness;Pain  PT Treatment Interventions DME instruction;Gait training;Functional mobility training;Therapeutic activities;Therapeutic exercise;Balance  training;Cognitive remediation;Patient/family education   PT Goals (Current goals can be found in the Care Plan section) Acute Rehab PT Goals Patient Stated Goal: to go home PT Goal Formulation: With patient Time For Goal Achievement: 12/14/15 Potential to Achieve Goals: Fair    Frequency Min 2X/week   Barriers to discharge Decreased caregiver support pt lives alone and has h/o falls    Co-evaluation               End of Session Equipment Utilized During Treatment: Gait belt Activity Tolerance: Other (comment);Patient limited by fatigue (limited by anxiety and fear of falling) Patient left: in chair;with call bell/phone within reach;with chair alarm set Nurse Communication: Mobility status    Functional Assessment Tool Used: Clinical Judgement Functional Limitation: Mobility: Walking and moving around Mobility: Walking and Moving Around Current Status (Z6109): At least 40 percent but less than 60 percent impaired, limited or restricted Mobility: Walking and Moving Around Goal Status [phone]): At least 20 percent but less than 40 percent impaired, limited or restricted    Time: 1117-1140 PT Time Calculation (min) (ACUTE ONLY): 23 min   Charges:   PT Evaluation $PT Eval Moderate Complexity: 1 Procedure PT Treatments $Therapeutic Activity: 8-22 mins   PT G Codes:   PT G-Codes **NOT FOR INPATIENT CLASS** Functional Assessment Tool Used: Clinical Judgement Functional Limitation: Mobility: Walking and moving around Mobility: Walking and Moving Around Current Status (U9811): At least 40 percent but less than 60 percent impaired, limited or restricted Mobility: Walking and Moving Around Goal Status [phone]): At least 20 percent but less than 40 percent impaired, limited or restricted   Michail Jewels PT, DPT [phone] Pager: [phone] 11/30/2015, 12:19 PM

## 2015-11-30 NOTE — Progress Notes (Signed)
CRITICAL VALUE ALERT  Critical value received:  Lactic Acid 3.2  Date of notification:  11/30/15  Time of notification:  1155  Critical value read back:Yes.    Nurse who received alert:  Merry Lofty, RN  MD notified (1st page):  Dhungel  Time of first page:  1155  MD notified (2nd page):  Time of second page:  Responding MD:  Dhungel  Time MD responded:  1155

## 2015-12-01 DIAGNOSIS — R000 Tachycardia, unspecified: Secondary | ICD-10-CM

## 2015-12-01 DIAGNOSIS — E872 Acidosis, unspecified: Secondary | ICD-10-CM | POA: Diagnosis present

## 2015-12-01 DIAGNOSIS — T68XXXA Hypothermia, initial encounter: Secondary | ICD-10-CM | POA: Insufficient documentation

## 2015-12-01 DIAGNOSIS — R410 Disorientation, unspecified: Secondary | ICD-10-CM | POA: Diagnosis not present

## 2015-12-01 DIAGNOSIS — Z720 Tobacco use: Secondary | ICD-10-CM

## 2015-12-01 DIAGNOSIS — E860 Dehydration: Secondary | ICD-10-CM | POA: Diagnosis present

## 2015-12-01 DIAGNOSIS — E871 Hypo-osmolality and hyponatremia: Secondary | ICD-10-CM

## 2015-12-01 DIAGNOSIS — E875 Hyperkalemia: Secondary | ICD-10-CM | POA: Diagnosis not present

## 2015-12-01 DIAGNOSIS — E43 Unspecified severe protein-calorie malnutrition: Secondary | ICD-10-CM | POA: Diagnosis present

## 2015-12-01 DIAGNOSIS — N179 Acute kidney failure, unspecified: Secondary | ICD-10-CM | POA: Insufficient documentation

## 2015-12-01 DIAGNOSIS — I5042 Chronic combined systolic (congestive) and diastolic (congestive) heart failure: Secondary | ICD-10-CM | POA: Diagnosis not present

## 2015-12-01 LAB — BASIC METABOLIC PANEL
Anion gap: 5 (ref 5–15)
BUN: 30 mg/dL — ABNORMAL HIGH (ref 6–20)
CO2: 19 mmol/L — ABNORMAL LOW (ref 22–32)
Calcium: 8.3 mg/dL — ABNORMAL LOW (ref 8.9–10.3)
Chloride: 113 mmol/L — ABNORMAL HIGH (ref 101–111)
Creatinine, Ser: 0.82 mg/dL (ref 0.44–1.00)
GFR calc Af Amer: >60 mL/min (ref >60)
GFR calc non Af Amer: >60 mL/min (ref >60)
Glucose, Bld: 118 mg/dL — ABNORMAL HIGH (ref 65–99)
Potassium: 3.3 mmol/L — ABNORMAL LOW (ref 3.5–5.1)
Sodium: 137 mmol/L (ref 135–145)

## 2015-12-01 MED ORDER — SENNOSIDES-DOCUSATE SODIUM 8.6-50 MG PO TABS: 2.0000 | ORAL_TABLET | Freq: Every evening | ORAL | Status: DC | PRN

## 2015-12-01 MED ORDER — ENSURE ENLIVE PO LIQD: 237.0000 mL | Freq: Three times a day (TID) | ORAL | Status: DC

## 2015-12-01 MED ORDER — SODIUM CHLORIDE 0.9 % IV SOLN
INTRAVENOUS | Status: DC
  Administered 2015-12-01: 08:00:00 via INTRAVENOUS

## 2015-12-01 MED ORDER — ASPIRIN 81 MG PO CHEW: 81.0000 mg | CHEWABLE_TABLET | Freq: Every day | ORAL | Status: DC

## 2015-12-01 MED ORDER — ASPIRIN 81 MG PO CHEW
81.0000 mg | CHEWABLE_TABLET | Freq: Every day | ORAL | Status: DC
  Administered 2015-12-01: 81 mg via ORAL
  Filled 2015-12-01: qty 1

## 2015-12-01 NOTE — Care Management Note (Signed)
Case Management Note  Patient Details  Name: Tammy [name] MRN: [id number] Date of Birth: 1941/04/28  Subjective/Objective:                      Acute renal failure [facility]) Action/Plan: Discharge planning Expected Discharge Date:  12/01/15               Expected Discharge Plan:  Home w Home Health Services  In-House Referral:  Clinical Social Work  Discharge planning Services  CM Consult  Post Acute Care Choice:    Choice offered to:  Patient  DME Arranged:    DME Agency:     HH Arranged:  RN, PT HH Agency:     Status of Service:  Completed, signed off  Medicare Important Message Given:    Date Medicare IM Given:    Medicare IM give by:    Date Additional Medicare IM Given:    Additional Medicare Important Message give by:     If discussed at Long Length of Stay Meetings, dates discussed:    Additional Comments: CM spoke with pt by phone to offer choice of home health agency.  Pt adamantly refuses all home health services. Pt states she will see her MD on March 7 and states she will be fine until that time.  CM requested permission to call a family member and Pt emphatically states "NO."   CM encouraged pt to call her PCP if she changes her  Mind.  Pt expressed her appreciation for the call and once again reminded this CM there would be NO HH agent coming to her house. No other CM needs were communicated. Yves Dill, RN 12/01/2015, 2:03 PM

## 2015-12-01 NOTE — Progress Notes (Signed)
Pt discharged per MD order, discharge instructions reviewed with both patient and son and all questions answered.

## 2015-12-01 NOTE — Discharge Summary (Signed)
Physician Discharge Summary  [name] JXB:[id number] DOB: October 03, 1941 DOA: 11/29/2015  PCP: Cala Bradford, MD  Admit date: 11/29/2015 Discharge date: 12/01/2015  Time spent: <30 minutes  Recommendations for Outpatient Follow-up:  1. D/c home with [facility] and PT. Follow p with PCP in 1 week. Patient refused SNF as recommended by PT.   Discharge Diagnoses:  Principal Problem:   Acute renal failure (HCC)   Active Problems:   Severe protein-calorie malnutrition (HCC)   Hyperkalemia   Tobacco abuse   HTN (hypertension)   Congestive dilated cardiomyopathy (HCC)   Chronic combined systolic and diastolic CHF, NYHA class 2 (HCC)   Mitral regurgitation   Dehydration with hyponatremia   Elevated lactic acid level   Agitation   COPD (chronic obstructive pulmonary disease) (HCC)   Dementia, ?mild   Lactic acid acidosis   Metabolic acidosis   Dehydration   Protein-calorie malnutrition, severe (HCC)   Discharge Condition: fair  Diet recommendation: heart healthy with supplements  CODE STATUS: Full code  Filed Weights   11/30/15 0017 11/30/15 0514 12/01/15 0309  Weight: 45.5 kg (100 lb 5 oz) 46.5 kg (102 lb 8.2 oz) 49 kg (108 lb 0.4 oz)    History of present illness:  75 year old female with history of CVA, COPD, ongoing tobacco use, command systolic and diastolic heart failure, hypertension, severe malnutrition and history of dementia who lives alone with her son visiting her daily and gives her medications was brought to the ED with several days a weakness poor by mouth intake and increasingly confused on the day of admission. In the ED she was mildly hypothermic with low normal blood pressure, tachycardic to 110s. Blood work done showed hyperkalemia with K of 6.3, acute kidney injury with BUN of 69 and 2.51, anion gap of 17 with metabolic acidosis, low bicarbonate of 9 and elevated lactic acid. Patient admitted to stepdown unit for further management.  Hospital Course:   Acute kidney injury with anion gap metabolic acidosis Secondary to dehydration. No signs of sepsis. Patient mildly hypothermic in the ED and received a dose of vancomycin and Zosyn. Follow blood cultures. UA unremarkable. Placed on a IV hydration along with D5 with bicarbonate. Anion gap and bicarbonate now normalized. -Discontinue losartan given her hyperkalemia and history of present illness. Can be resumed if renal function continues to be stable and potassium normal during outpatient follow-up.  Lactic acidosis Possibly due to dehydration and acute kidney injury. No clinical signs or symptoms of sepsis. With IV boluses..  Not on any medications that would contribute. No clinical signs or symptoms of bowel ischemia. Cultures negative. LFTs normal.  Dilated cardiomyopathy Patient hypovolemic. Held blood pressure medications. Will start on baby aspirin. (was continuing ASA 325 mg bid at home since her discharge after hip fracture repair in nov 2016. I have discontinued it.).    Agitation with possible underlying mild dementia On admission. May have been related to primary symptoms. Appeared alert and oriented subsequent evaluation.   Generalized weakness with poor by mouth intake In by physical therapy and recommended skilled nursing facility versus home with 24-hour supervision. Patient clearly refused went to skilled nursing facility. Will arrange home health RN and PT.   Severe malnutrition Dietitian consulted. Was taking ensure supplements at home and increased frequency to 3 times a day.  COPD with ongoing tobacco use Continue when necessary albuterol     Family Communication: Discussed with son on 2/24 Disposition Plan: Discharge home with home health RN and PT   Consultants:  None  Procedures:  None  Antibiotics:  IV vancomycin and Zosyn 1 in the ED  HPI/Subjective: Discharge Exam: Filed Vitals:   12/01/15 0309 12/01/15 0800  BP: 132/56 130/59  Pulse: 95  88  Temp: 98.2 F (36.8 C) 98.7 F (37.1 C)  Resp: 20 18    General: Elderly thin built female not in distress  HEENT: No pallor, dry mucosa, supple neck, temporal wasting  Chest: Clear bilaterally  CVS: Normal S1 and S2, no murmurs rub or gallop  GI: Soft, nondistended, nontender, bowel sounds present  Musculoskeletal: On, no edema  CNS: Alert and oriented x2-3, nonfocal   Discharge Instructions    Current Discharge Medication List    START taking these medications   Details  aspirin 81 MG chewable tablet Chew 1 tablet (81 mg total) by mouth daily. Qty: 30 tablet, Refills: 0      CONTINUE these medications which have CHANGED   Details  feeding supplement, ENSURE ENLIVE, (ENSURE ENLIVE) LIQD Take 237 mLs by mouth 3 (three) times daily between meals. Qty: 237 mL, Refills: 12    senna-docusate (SENOKOT-S) 8.6-50 MG tablet Take 2 tablets by mouth at bedtime as needed for moderate constipation.      CONTINUE these medications which have NOT CHANGED   Details  acetaminophen (TYLENOL) 500 MG tablet Take 2 tablets (1,000 mg total) by mouth every 8 (eight) hours. For 5 more days from 08/22/15 and then change to prn    albuterol (PROVENTIL) (2.5 MG/3ML) 0.083% nebulizer solution Take 3 mLs (2.5 mg total) by nebulization every 4 (four) hours as needed for wheezing or shortness of breath. Qty: 75 mL, Refills: 12    carvedilol (COREG) 3.125 MG tablet Take 1 tablet (3.125 mg total) by mouth 2 (two) times daily with a meal. Qty: 60 tablet, Refills: 2    ferrous sulfate 325 (65 FE) MG tablet Take 1 tablet (325 mg total) by mouth 2 (two) times daily with a meal.    pantoprazole (PROTONIX) 40 MG tablet Take 1 tablet (40 mg total) by mouth daily. Qty: 30 tablet, Refills: 1    sertraline (ZOLOFT) 50 MG tablet Take 50 mg by mouth daily. Refills: 5    traZODone (DESYREL) 50 MG tablet Take 50 mg by mouth at bedtime. Refills: 5      STOP taking these medications      aspirin EC 325 MG tablet      HYDROcodone-acetaminophen (NORCO/VICODIN) 5-325 MG tablet      lisinopril (PRINIVIL,ZESTRIL) 2.5 MG tablet      polyethylene glycol (MIRALAX / GLYCOLAX) packet        No Known Allergies Follow-up Information    Follow up with WHITE,CYNTHIA S, MD. Schedule an appointment as soon as possible for a visit in 1 week.   Specialty:  Family Medicine   Contact information:   [phone] [place] [address] [phone]        The results of significant diagnostics from this hospitalization (including imaging, microbiology, ancillary and laboratory) are listed below for reference.    Significant Diagnostic Studies: Dg Chest 1 View  11/29/2015  CLINICAL DATA:  Fall. EXAM: CHEST 1 VIEW COMPARISON:  08/28/2015 FINDINGS: Lungs are clear. Heart and mediastinum are within normal limits. Negative for a pneumothorax. Lucency underneath the left hemidiaphragm is most compatible with gaseous distension of the stomach. Atherosclerotic calcifications at the aortic arch. IMPRESSION: No active disease. Electronically Signed   By: Richarda Overlie M.D.   On:  11/29/2015 15:00   Ct Head Wo Contrast  11/29/2015  CLINICAL DATA:  Altered mental status. Weakness with dehydration and hip pain for 2 days. EXAM: CT HEAD WITHOUT CONTRAST TECHNIQUE: Contiguous axial images were obtained from the base of the skull through the vertex without intravenous contrast. COMPARISON:  PET-CT 03/01/2015. FINDINGS: There is some artifact related to the patient's earrings and necklace which she would not allow to be removed for the examination. Brain: There is no evidence of acute intracranial hemorrhage, mass lesion, brain edema or extra-axial fluid collection. The ventricles and subarachnoid spaces are appropriately sized for age. There is no CT evidence of acute cortical infarction. Stable mild periventricular white matter disease, likely due to chronic small vessel ischemic changes.  Intracranial vascular calcifications are noted. Bones/sinuses/visualized face: There is minimal fluffy opacity in the left division of the sphenoid sinus. The visualized paranasal sinuses, mastoid air cells and middle ears are otherwise clear. The calvarium is intact. IMPRESSION: No acute findings. Stable periventricular white matter disease, likely due to chronic small vessel ischemic changes. Electronically Signed   By: Carey Bullocks M.D.   On: 11/29/2015 15:13   Dg Hip Unilat With Pelvis 2-3 Views Left  11/29/2015  CLINICAL DATA:  Left hip pain after falling.  Confusion. EXAM: DG HIP (WITH OR WITHOUT PELVIS) 2-3V LEFT COMPARISON:  Radiographs 08/18/2015. FINDINGS: Status post dynamic screw and intramedullary nail fixation of the previously demonstrated comminuted left intertrochanteric femur fracture. The hardware is intact without displacement or loosening. The intertrochanteric fracture demonstrates interval partial healing. No new fracture or dislocation identified. The bones are demineralized. Degenerative changes are present at both hips and in the lower lumbar spine. IMPRESSION: Status post or ORIF of left intertrochanteric femur fracture. No acute osseous findings identified. Electronically Signed   By: Carey Bullocks M.D.   On: 11/29/2015 15:04    Microbiology: Recent Results (from the past 240 hour(s))  Blood Culture (routine x 2)     Status: None (Preliminary result)   Collection Time: 11/29/15  2:21 PM  Result Value Ref Range Status   Specimen Description BLOOD LEFT HAND  Final   Special Requests IN PEDIATRIC BOTTLE 2CC  Final   Culture NO GROWTH 1 DAY  Final   Report Status PENDING  Incomplete  Urine culture     Status: None   Collection Time: 11/29/15  2:40 PM  Result Value Ref Range Status   Specimen Description URINE, CATHETERIZED  Final   Special Requests NONE  Final   Culture NO GROWTH 1 DAY  Final   Report Status 11/30/2015 FINAL  Final  Blood Culture (routine x 2)      Status: None (Preliminary result)   Collection Time: 11/29/15  8:42 PM  Result Value Ref Range Status   Specimen Description BLOOD RIGHT HAND  Final   Special Requests IN PEDIATRIC BOTTLE 2CC  Final   Culture NO GROWTH < 24 HOURS  Final   Report Status PENDING  Incomplete  MRSA PCR Screening     Status: None   Collection Time: 11/30/15 12:30 AM  Result Value Ref Range Status   MRSA by PCR NEGATIVE NEGATIVE Final    Comment:        The GeneXpert MRSA Assay (FDA approved for NASAL specimens only), is one component of a comprehensive MRSA colonization surveillance program. It is not intended to diagnose MRSA infection nor to guide or monitor treatment for MRSA infections.      Labs: Basic Metabolic Panel:  Recent  Labs Lab 11/29/15 1418 11/29/15 2108 11/30/15 0550 11/30/15 1629 12/01/15 0257  NA 133* 134* 137 140 137  K 6.3* 4.5 4.7 3.7 3.3*  CL 105 108 112* 117* 113*  CO2 11* 9* 11* 16* 19*  GLUCOSE 119* 59* 76 118* 118*  BUN 69* 65* 59* 40* 30*  CREATININE 2.51* 1.96* 1.65* 0.96 0.82  CALCIUM 10.1 9.4 9.3 8.6* 8.3*  MG  --  2.2  --   --   --   PHOS  --  4.8*  --   --   --    Liver Function Tests:  Recent Labs Lab 11/29/15 1418 11/30/15 0550  AST 38 22  ALT 15 12*  ALKPHOS 92 52  BILITOT 0.4 0.7  PROT 8.3* 6.6  ALBUMIN 4.5 3.5   No results for input(s): LIPASE, AMYLASE in the last 168 hours. No results for input(s): AMMONIA in the last 168 hours. CBC:  Recent Labs Lab 11/29/15 1418 11/30/15 0550  WBC 4.6 11.8*  NEUTROABS 3.5  --   HGB 13.1 11.4*  HCT 38.8 33.0*  MCV 95.8 96.8  PLT 228 174   Cardiac Enzymes: No results for input(s): CKTOTAL, CKMB, CKMBINDEX, TROPONINI in the last 168 hours. BNP: BNP (last 3 results) No results for input(s): BNP in the last 8760 hours.  ProBNP (last 3 results) No results for input(s): PROBNP in the last 8760 hours.  CBG:  Recent Labs Lab 11/29/15 1338  GLUCAP 102*       Signed:  Eddie North MD.  [facility] 12/01/2015, 11:06 AM

## 2015-12-04 LAB — CULTURE, BLOOD (ROUTINE X 2)
Culture: NO GROWTH
Culture: NO GROWTH

## 2016-07-01 ENCOUNTER — Emergency Department (HOSPITAL_COMMUNITY): Payer: Medicare Other

## 2016-07-01 ENCOUNTER — Encounter (HOSPITAL_COMMUNITY): Payer: Self-pay | Admitting: Emergency Medicine

## 2016-07-01 ENCOUNTER — Observation Stay (HOSPITAL_COMMUNITY)
Admission: EM | Admit: 2016-07-01 | Discharge: 2016-07-03 | Disposition: A | Discharge status: Home / Self Care | Payer: Medicare Other | Attending: Internal Medicine | Admitting: Internal Medicine

## 2016-07-01 DIAGNOSIS — M5030 Other cervical disc degeneration, unspecified cervical region: Secondary | ICD-10-CM | POA: Diagnosis not present

## 2016-07-01 DIAGNOSIS — F0391 Unspecified dementia with behavioral disturbance: Secondary | ICD-10-CM | POA: Insufficient documentation

## 2016-07-01 DIAGNOSIS — F17210 Nicotine dependence, cigarettes, uncomplicated: Secondary | ICD-10-CM | POA: Diagnosis not present

## 2016-07-01 DIAGNOSIS — W19XXXA Unspecified fall, initial encounter: Secondary | ICD-10-CM

## 2016-07-01 DIAGNOSIS — I10 Essential (primary) hypertension: Secondary | ICD-10-CM | POA: Diagnosis not present

## 2016-07-01 DIAGNOSIS — M25572 Pain in left ankle and joints of left foot: Secondary | ICD-10-CM | POA: Diagnosis not present

## 2016-07-01 DIAGNOSIS — Z720 Tobacco use: Secondary | ICD-10-CM | POA: Diagnosis present

## 2016-07-01 DIAGNOSIS — Y92009 Unspecified place in unspecified non-institutional (private) residence as the place of occurrence of the external cause: Secondary | ICD-10-CM

## 2016-07-01 DIAGNOSIS — F0390 Unspecified dementia without behavioral disturbance: Secondary | ICD-10-CM | POA: Diagnosis present

## 2016-07-01 DIAGNOSIS — M25552 Pain in left hip: Secondary | ICD-10-CM | POA: Insufficient documentation

## 2016-07-01 DIAGNOSIS — R451 Restlessness and agitation: Secondary | ICD-10-CM | POA: Insufficient documentation

## 2016-07-01 DIAGNOSIS — G4700 Insomnia, unspecified: Secondary | ICD-10-CM | POA: Insufficient documentation

## 2016-07-01 DIAGNOSIS — T796XXA Traumatic ischemia of muscle, initial encounter: Secondary | ICD-10-CM

## 2016-07-01 DIAGNOSIS — Y9389 Activity, other specified: Secondary | ICD-10-CM | POA: Diagnosis not present

## 2016-07-01 DIAGNOSIS — J449 Chronic obstructive pulmonary disease, unspecified: Secondary | ICD-10-CM | POA: Insufficient documentation

## 2016-07-01 DIAGNOSIS — M25571 Pain in right ankle and joints of right foot: Secondary | ICD-10-CM | POA: Insufficient documentation

## 2016-07-01 DIAGNOSIS — Y92019 Unspecified place in single-family (private) house as the place of occurrence of the external cause: Secondary | ICD-10-CM | POA: Diagnosis not present

## 2016-07-01 DIAGNOSIS — M6282 Rhabdomyolysis: Secondary | ICD-10-CM | POA: Diagnosis not present

## 2016-07-01 DIAGNOSIS — R000 Tachycardia, unspecified: Secondary | ICD-10-CM | POA: Insufficient documentation

## 2016-07-01 DIAGNOSIS — I340 Nonrheumatic mitral (valve) insufficiency: Secondary | ICD-10-CM | POA: Diagnosis not present

## 2016-07-01 DIAGNOSIS — Y998 Other external cause status: Secondary | ICD-10-CM | POA: Insufficient documentation

## 2016-07-01 DIAGNOSIS — Z8673 Personal history of transient ischemic attack (TIA), and cerebral infarction without residual deficits: Secondary | ICD-10-CM | POA: Insufficient documentation

## 2016-07-01 DIAGNOSIS — I5042 Chronic combined systolic (congestive) and diastolic (congestive) heart failure: Secondary | ICD-10-CM | POA: Diagnosis not present

## 2016-07-01 DIAGNOSIS — G8929 Other chronic pain: Secondary | ICD-10-CM | POA: Diagnosis not present

## 2016-07-01 DIAGNOSIS — I110 Hypertensive heart disease with heart failure: Secondary | ICD-10-CM | POA: Diagnosis not present

## 2016-07-01 DIAGNOSIS — R52 Pain, unspecified: Secondary | ICD-10-CM | POA: Diagnosis present

## 2016-07-01 DIAGNOSIS — I672 Cerebral atherosclerosis: Secondary | ICD-10-CM | POA: Diagnosis not present

## 2016-07-01 DIAGNOSIS — Z7982 Long term (current) use of aspirin: Secondary | ICD-10-CM | POA: Diagnosis not present

## 2016-07-01 DIAGNOSIS — Y92099 Unspecified place in other non-institutional residence as the place of occurrence of the external cause: Secondary | ICD-10-CM

## 2016-07-01 DIAGNOSIS — W1830XA Fall on same level, unspecified, initial encounter: Secondary | ICD-10-CM | POA: Diagnosis not present

## 2016-07-01 LAB — BASIC METABOLIC PANEL
Anion gap: 16 — ABNORMAL HIGH (ref 5–15)
BUN: 9 mg/dL (ref 6–20)
CO2: 21 mmol/L — ABNORMAL LOW (ref 22–32)
Calcium: 9.2 mg/dL (ref 8.9–10.3)
Chloride: 106 mmol/L (ref 101–111)
Creatinine, Ser: 0.83 mg/dL (ref 0.44–1.00)
GFR calc Af Amer: >60 mL/min (ref >60)
GFR calc non Af Amer: >60 mL/min (ref >60)
Glucose, Bld: 95 mg/dL (ref 65–99)
Potassium: 4 mmol/L (ref 3.5–5.1)
Sodium: 143 mmol/L (ref 135–145)

## 2016-07-01 LAB — CBC
HCT: 41.6 % (ref 36.0–46.0)
Hemoglobin: 14.1 g/dL (ref 12.0–15.0)
MCH: 31.7 pg (ref 26.0–34.0)
MCHC: 33.9 g/dL (ref 30.0–36.0)
MCV: 93.5 fL (ref 78.0–100.0)
Platelets: 283 10*3/uL (ref 150–400)
RBC: 4.45 MIL/uL (ref 3.87–5.11)
RDW: 12.2 % (ref 11.5–15.5)
WBC: 8.9 10*3/uL (ref 4.0–10.5)

## 2016-07-01 LAB — CBG MONITORING, ED: Glucose-Capillary: 147 mg/dL — ABNORMAL HIGH (ref 65–99)

## 2016-07-01 LAB — URINALYSIS, ROUTINE W REFLEX MICROSCOPIC
Bilirubin Urine: NEGATIVE
Glucose, UA: NEGATIVE mg/dL
Hgb urine dipstick: NEGATIVE
Ketones, ur: NEGATIVE mg/dL
Leukocytes, UA: NEGATIVE
Nitrite: NEGATIVE
Protein, ur: NEGATIVE mg/dL
Specific Gravity, Urine: 1.015 (ref 1.005–1.030)
pH: 6 (ref 5.0–8.0)

## 2016-07-01 LAB — BRAIN NATRIURETIC PEPTIDE: B Natriuretic Peptide: 145.8 pg/mL — ABNORMAL HIGH (ref 0.0–100.0)

## 2016-07-01 LAB — I-STAT TROPONIN, ED: Troponin i, poc: 0.04 ng/mL (ref 0.00–0.08)

## 2016-07-01 LAB — CK: Total CK: 1559 U/L — ABNORMAL HIGH (ref 38–234)

## 2016-07-01 MED ORDER — LORAZEPAM 2 MG/ML IJ SOLN
INTRAMUSCULAR | Status: AC
  Administered 2016-07-01: 1 mg via INTRAVENOUS
  Filled 2016-07-01: qty 1

## 2016-07-01 MED ORDER — SODIUM CHLORIDE 0.9 % IV BOLUS (SEPSIS)
500.0000 mL | Freq: Once | INTRAVENOUS | Status: AC
  Administered 2016-07-01: 500 mL via INTRAVENOUS

## 2016-07-01 MED ORDER — LORAZEPAM 2 MG/ML IJ SOLN
1.0000 mg | Freq: Once | INTRAMUSCULAR | Status: AC
  Administered 2016-07-01 (×2): 1 mg via INTRAVENOUS

## 2016-07-01 MED ORDER — LORAZEPAM 2 MG/ML IJ SOLN
0.5000 mg | Freq: Once | INTRAMUSCULAR | Status: AC
  Administered 2016-07-01: 0.5 mg via INTRAVENOUS
  Filled 2016-07-01: qty 1

## 2016-07-01 NOTE — H&P (Signed)
History and Physical    Tammy [name] RUE:[id number] DOB: Dec 22, 1940 DOA: 07/01/2016  PCP: Cala Bradford, MD Consultants:  None Patient coming from: home - lives alone; NOK: Griselle Rufer, [phone]  Chief Complaint: fall  HPI: Tammy [name] is a 75 y.o. female with medical history significant of HTN, insomnia presenting following a fall.  She has dementia and is a poor historian.  History is provided alternately by herself and her son.  She reports that her son thought she needed to go to the doctor.  Fell into floor overnight, son found her about 2pm.  Unable to get up, hurting all over. H /o prior hip and ankle fratures, has chronic pain in those joints.  The patient is agitated and does not think she needs to stay in the hospital overnight.  ED Course: Per Dr. Ranae Palms: Initiated IV fluids in the emergency department. CT head without any evidence of acute trauma. Discussed with [facility]. Will admit to observation telemetry bed. Likely will need home health consult at discharge.  Review of Systems: As per HPI; otherwise 10 point review of systems reviewed and negative.   Ambulatory Status:  Ambulated independently prior to fall, although she has a walker  Past Medical History:  Diagnosis Date  . Hypertension   . Insomnia     Past Surgical History:  Procedure Laterality Date  . INTRAMEDULLARY (IM) NAIL INTERTROCHANTERIC Left 08/19/2015   Procedure: INTRAMEDULLARY (IM) NAIL INTERTROCHANTRIC;  Surgeon: Jodi Geralds, MD;  Location: [facility] OR;  Service: Orthopedics;  Laterality: Left;    Social History   Social History  . Marital status: Single    Spouse name: N/A  . Number of children: N/A  . Years of education: N/A   Occupational History  . retired    Social History Main Topics  . Smoking status: Current Every Day Smoker    Packs/day: 2.00    Years: 50.00    Types: Cigarettes  . Smokeless tobacco: Never Used  . Alcohol use No  . Drug use: No  . Sexual activity:  Not on file   Other Topics Concern  . Not on file   Social History Narrative  . No narrative on file    No Known Allergies  Family History  Problem Relation Age of Onset  . Congestive Heart Failure Son 55    Prior to Admission medications   Medication Sig Start Date End Date Taking? Authorizing Provider  diphenhydramine-acetaminophen (TYLENOL PM) 25-500 MG TABS tablet Take 2 tablets by mouth at bedtime.   Yes Historical Provider, MD  acetaminophen (TYLENOL) 500 MG tablet Take 2 tablets (1,000 mg total) by mouth every 8 (eight) hours. For 5 more days from 08/22/15 and then change to prn Patient not taking: Reported on 07/01/2016 08/22/15   Maretta Bees, MD  albuterol (PROVENTIL) (2.5 MG/3ML) 0.083% nebulizer solution Take 3 mLs (2.5 mg total) by nebulization every 4 (four) hours as needed for wheezing or shortness of breath. Patient not taking: Reported on 07/01/2016 03/04/15   Kinnie Scales Rinehuls, PA-C  aspirin 81 MG chewable tablet Chew 1 tablet (81 mg total) by mouth daily. Patient not taking: Reported on 07/01/2016 12/01/15   Theda Belfast Dhungel, MD  carvedilol (COREG) 3.125 MG tablet Take 1 tablet (3.125 mg total) by mouth 2 (two) times daily with a meal. Patient not taking: Reported on 07/01/2016 03/04/15   Onalee Hua L Rinehuls, PA-C  feeding supplement, ENSURE ENLIVE, (ENSURE ENLIVE) LIQD Take 237 mLs by mouth 3 (three) times daily between  meals. Patient not taking: Reported on 07/01/2016 12/01/15   Theda Belfast Dhungel, MD  ferrous sulfate 325 (65 FE) MG tablet Take 1 tablet (325 mg total) by mouth 2 (two) times daily with a meal. Patient not taking: Reported on 07/01/2016 08/22/15   Maretta Bees, MD  pantoprazole (PROTONIX) 40 MG tablet Take 1 tablet (40 mg total) by mouth daily. Patient not taking: Reported on 07/01/2016 03/04/15   Onalee Hua L Rinehuls, PA-C  senna-docusate (SENOKOT-S) 8.6-50 MG tablet Take 2 tablets by mouth at bedtime as needed for moderate constipation. Patient not taking:  Reported on 07/01/2016 12/01/15   Eddie North, MD    Physical Exam: Vitals:   07/01/16 2300 07/01/16 2332 07/02/16 0000 07/02/16 0030  BP: 137/68 145/81 (!) 126/108 145/64  Pulse: 115  115 109  Resp: 20 26 23 20   Temp:      TempSrc:      SpO2: 99%  98% 100%  Weight:      Height:         General: Appears calm and comfortable and is NAD.  She is wearing a wig which falls over her eyes throughout most of the evaluation. Eyes:  PERRL, EOMI, normal lids, iris ENT:  grossly normal hearing, lips & tongue, mmm Neck:  no LAD, masses or thyromegaly Cardiovascular:  RRR, no m/r/g. No LE edema.  Respiratory:  CTA bilaterally, no w/r/r. Normal respiratory effort. Abdomen:  soft, ntnd, NABS Skin:  no rash or induration seen on limited exam Musculoskeletal:  grossly normal tone BUE/BLE, good ROM, no bony abnormality Psychiatric:  Appears to have mild-moderate cognitive impairment, certainly impaired judgement and irrational thinking patterns Neurologic:  CN 2-12 grossly intact, moves all extremities in coordinated fashion, sensation intact  Labs on Admission: I have personally reviewed following labs and imaging studies  CBC:  Recent Labs Lab 07/01/16 1640  WBC 8.9  HGB 14.1  HCT 41.6  MCV 93.5  PLT 283   Basic Metabolic Panel:  Recent Labs Lab 07/01/16 1640  NA 143  K 4.0  CL 106  CO2 21*  GLUCOSE 95  BUN 9  CREATININE 0.83  CALCIUM 9.2   GFR: Estimated Creatinine Clearance: 53 mL/min (by C-G formula based on SCr of 0.83 mg/dL). Liver Function Tests: No results for input(s): AST, ALT, ALKPHOS, BILITOT, PROT, ALBUMIN in the last 168 hours. No results for input(s): LIPASE, AMYLASE in the last 168 hours. No results for input(s): AMMONIA in the last 168 hours. Coagulation Profile: No results for input(s): INR, PROTIME in the last 168 hours. Cardiac Enzymes:  Recent Labs Lab 07/01/16 1738  CKTOTAL 1,559*   BNP (last 3 results) No results for input(s): PROBNP in  the last 8760 hours. HbA1C: No results for input(s): HGBA1C in the last 72 hours. CBG:  Recent Labs Lab 07/01/16 1632  GLUCAP 147*   Lipid Profile: No results for input(s): CHOL, HDL, LDLCALC, TRIG, CHOLHDL, LDLDIRECT in the last 72 hours. Thyroid Function Tests: No results for input(s): TSH, T4TOTAL, FREET4, T3FREE, THYROIDAB in the last 72 hours. Anemia Panel: No results for input(s): VITAMINB12, FOLATE, FERRITIN, TIBC, IRON, RETICCTPCT in the last 72 hours. Urine analysis:    Component Value Date/Time   COLORURINE YELLOW 07/01/2016 2221   APPEARANCEUR CLEAR 07/01/2016 2221   LABSPEC 1.015 07/01/2016 2221   PHURINE 6.0 07/01/2016 2221   GLUCOSEU NEGATIVE 07/01/2016 2221   HGBUR NEGATIVE 07/01/2016 2221   BILIRUBINUR NEGATIVE 07/01/2016 2221   KETONESUR NEGATIVE 07/01/2016 2221   PROTEINUR NEGATIVE 07/01/2016  2221   UROBILINOGEN 0.2 08/19/2015 0756   NITRITE NEGATIVE 07/01/2016 2221   LEUKOCYTESUR NEGATIVE 07/01/2016 2221    Creatinine Clearance: Estimated Creatinine Clearance: 53 mL/min (by C-G formula based on SCr of 0.83 mg/dL).  Sepsis Labs: @LABRCNTIP (procalcitonin:4,lacticidven:4) )No results found for this or any previous visit (from the past 240 hour(s)).   Radiological Exams on Admission: Dg Chest 1 View  Result Date: 07/01/2016 CLINICAL DATA:  Pain after fall last night. EXAM: CHEST 1 VIEW COMPARISON:  November 29, 2015 FINDINGS: The heart size and mediastinal contours are within normal limits. Both lungs are clear. The visualized skeletal structures are unremarkable. IMPRESSION: No active disease. Electronically Signed   By: Gerome Sam III M.D   On: 07/01/2016 19:15   Ct Head Wo Contrast  Result Date: 07/01/2016 CLINICAL DATA:  Status post fall of the head. EXAM: CT HEAD WITHOUT CONTRAST CT CERVICAL SPINE WITHOUT CONTRAST TECHNIQUE: Multidetector CT imaging of the head and cervical spine was performed following the standard protocol without intravenous  contrast. Multiplanar CT image reconstructions of the cervical spine were also generated. COMPARISON:  None. FINDINGS: CT HEAD FINDINGS Brain: No evidence of acute infarction, hemorrhage, extra-axial collection, ventriculomegaly, or mass effect. Generalized cerebral atrophy. Periventricular white matter low attenuation likely secondary to microangiopathy. Vascular: Cerebrovascular atherosclerotic calcifications are noted. Skull: Negative for fracture or focal lesion. Sinuses/Orbits: Visualized portions of the orbits are unremarkable. Visualized portions of the paranasal sinuses and mastoid air cells are unremarkable. Other: None. CT CERVICAL SPINE FINDINGS Significant patient motion during the examination limits evaluation. C4 through C7 is nondiagnostic. Alignment: Alignment is grossly intact. Skull base and vertebrae: No acute fracture. No primary bone lesion or focal pathologic process. Soft tissues and spinal canal: No prevertebral fluid or swelling. No visible canal hematoma. Disc levels: Degenerative disc disease with disc height loss throughout the cervical spine. Upper chest: Lung apices are clear. Other: No fluid collection or hematoma. IMPRESSION: 1. No acute intracranial pathology. 2. Severely limited evaluation of the cervical spine secondary to patient motion making the levels of C4 through C7 nondiagnostic. With limitations as described above no acute osseous abnormality of the cervical spine. If there is further clinical concern regarding injury to the cervical spine, recommend repeat CT cervical spine. Electronically Signed   By: Elige Ko   On: 07/01/2016 18:52   Ct Cervical Spine Wo Contrast  Result Date: 07/01/2016 CLINICAL DATA:  Status post fall of the head. EXAM: CT HEAD WITHOUT CONTRAST CT CERVICAL SPINE WITHOUT CONTRAST TECHNIQUE: Multidetector CT imaging of the head and cervical spine was performed following the standard protocol without intravenous contrast. Multiplanar CT image  reconstructions of the cervical spine were also generated. COMPARISON:  None. FINDINGS: CT HEAD FINDINGS Brain: No evidence of acute infarction, hemorrhage, extra-axial collection, ventriculomegaly, or mass effect. Generalized cerebral atrophy. Periventricular white matter low attenuation likely secondary to microangiopathy. Vascular: Cerebrovascular atherosclerotic calcifications are noted. Skull: Negative for fracture or focal lesion. Sinuses/Orbits: Visualized portions of the orbits are unremarkable. Visualized portions of the paranasal sinuses and mastoid air cells are unremarkable. Other: None. CT CERVICAL SPINE FINDINGS Significant patient motion during the examination limits evaluation. C4 through C7 is nondiagnostic. Alignment: Alignment is grossly intact. Skull base and vertebrae: No acute fracture. No primary bone lesion or focal pathologic process. Soft tissues and spinal canal: No prevertebral fluid or swelling. No visible canal hematoma. Disc levels: Degenerative disc disease with disc height loss throughout the cervical spine. Upper chest: Lung apices are clear. Other: No fluid  collection or hematoma. IMPRESSION: 1. No acute intracranial pathology. 2. Severely limited evaluation of the cervical spine secondary to patient motion making the levels of C4 through C7 nondiagnostic. With limitations as described above no acute osseous abnormality of the cervical spine. If there is further clinical concern regarding injury to the cervical spine, recommend repeat CT cervical spine. Electronically Signed   By: Elige KoHetal  Patel   On: 07/01/2016 18:52   Dg Hip Unilat With Pelvis 2-3 Views Left  Result Date: 07/01/2016 CLINICAL DATA:  Recent fall.  Left hip pain. EXAM: DG HIP (WITH OR WITHOUT PELVIS) 2-3V LEFT COMPARISON:  11/29/2015 FINDINGS: Again noted is an intramedullary nail in the left femur with a dynamic hip screw extending through the left femoral head and neck. Mild lucency around this dynamic hip  screw. Extensive sclerosis in the left trochanteric region compatible with old trauma and callus formation. There is a single distal interlocking screw. No acute fracture in the left femur. Pelvic bony ring is intact. Superior joint space narrowing in the right hip joint. Joint space narrowing in the superior left hip joint. Left hip is located. Knee is located. IMPRESSION: No acute bone abnormality in the left femur. Left femur hardware is intact. Electronically Signed   By: Richarda OverlieAdam  Henn M.D.   On: 07/01/2016 19:17    EKG: Independently reviewed.  Sinus tachycardia with rate 116; no evidence of acute ischemia  Assessment/Plan Principal Problem:   Fall at home Active Problems:   Tobacco abuse   HTN (hypertension)   Agitation   Dementia   Tachycardia   Rhabdomyolysis   Fall resulting in rhabdomyolysis -Patient with mechanical fall overnight and inability to get up, resulting in CK of 1559 and generalized pain -Mild rhabdomyolysis -Needs ongoing IVF hydration but likely able to discharge to home tomorrow if doing well -Will recheck CK in AM  Dementia with agitation -Patient fairly adamant that she does not wish to stay in the hospital overnight -Mild to moderate dementia, likely does not have the cognitive ability to have capacity -Her son wishes for her to stay overnight and will not take her home -Will give Ativan to try to calm patient in the hopes that she will then be willing to stay overnight -Haldol prn agitation -Needs serious consideration about whether she remains safe to be living alone  Tobacco dependence -Encourage cessation.  This was discussed with the patient and should be reviewed on an ongoing basis.   -Patch ordered.  Tachycardia -May be related to rhabdo, but patient also reports not taking her Coreg - so this could also be rebound tachycardia -Will resume Coreg, rehydrate, and follow HR -Will monitor on telemetry overnight  HTN -Resume Coreg -Goal SBP <  160   DVT prophylaxis: Lovenox  Code Status:  Full - confirmed with patient/family Family Communication: Son present throughout evaluation Disposition Plan: To be determined Consults called: None Admission status: Observation, telemetry   Jonah BlueJennifer Texanna Hilburn MD [facility]  If 7PM-7AM, please contact night-coverage [email or web address] Password [email or web address]  07/02/2016, 12:52 AM

## 2016-07-01 NOTE — ED Notes (Signed)
Pt son leaving. Pt cell phone placed in personal belongings bag.

## 2016-07-01 NOTE — ED Triage Notes (Signed)
Pt BIB son. Pt reports that she went to bed last night like usual and "I woke up on the floor." Pt does not remember falling out of bed. Pt has baseline dementia and son sts she is a little more unsteady on her feet and confused than normal. Pt sts she is sore all over, but denies any specific pain.

## 2016-07-01 NOTE — ED Provider Notes (Signed)
[facility]-EMERGENCY DEPT Provider Note   CSN: [id number] Arrival date & time: 07/01/16  1542     History   Chief Complaint Chief Complaint  Patient presents with  . Fall    HPI Tammy [name] is a 75 y.o. female.  HPI Patient lives alone. Had unwitnessed fall from standing position sometime throughout the night. States she was on the floor and unable to get up. Complains of soreness throughout. States the last thing she remembers was taking her medication yesterday evening around 8 PM. Has had recent cough and sputum production. Patient is a poor historian.  Past Medical History:  Diagnosis Date  . Hypertension   . Insomnia     Patient Active Problem List   Diagnosis Date Noted  . Fall at home 07/01/2016  . Lactic acid acidosis 12/01/2015  . Metabolic acidosis 12/01/2015  . Dehydration 12/01/2015  . Protein-calorie malnutrition, severe (HCC) 12/01/2015  . AKI (acute kidney injury) (HCC)   . Hyponatremia   . Hypothermia   . Tachycardia   . Hyperkalemia 11/29/2015  . Acute renal failure (HCC) 11/29/2015  . Chronic combined systolic and diastolic CHF, 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/2015  . Mitral regurgitation 11/29/2015  . Dehydration with hyponatremia 11/29/2015  . Elevated lactic acid level 11/29/2015  . Agitation 11/29/2015  . COPD (chronic obstructive pulmonary disease) (HCC) 11/29/2015  . Dementia 11/29/2015  . Confusion   . Acute blood loss anemia 08/21/2015  . Postoperative anemia due to acute blood loss 08/20/2015  . Displaced intertrochanteric fracture of left femur (HCC) 08/19/2015  . Closed left hip fracture (HCC) 08/18/2015  . Secondary cardiomyopathy (HCC)   . Acute systolic congestive heart failure, NYHA class 3 (HCC) 03/02/2015  . COPD exacerbation (HCC) 03/02/2015  . Tobacco abuse 03/02/2015  . Severe protein-calorie malnutrition (HCC) 03/02/2015  . HTN (hypertension) 03/02/2015  . Congestive dilated cardiomyopathy (HCC)   . CVA (cerebral infarction)  02/28/2015  . Stroke [facility])     Past Surgical History:  Procedure Laterality Date  . INTRAMEDULLARY (IM) NAIL INTERTROCHANTERIC Left 08/19/2015   Procedure: INTRAMEDULLARY (IM) NAIL INTERTROCHANTRIC;  Surgeon: Jodi Geralds, MD;  Location: [facility] OR;  Service: Orthopedics;  Laterality: Left;    OB History    No data available       Home Medications    Prior to Admission medications   Medication Sig Start Date End Date Taking? Authorizing Provider  diphenhydramine-acetaminophen (TYLENOL PM) 25-500 MG TABS tablet Take 2 tablets by mouth at bedtime.   Yes Historical Provider, MD  acetaminophen (TYLENOL) 500 MG tablet Take 2 tablets (1,000 mg total) by mouth every 8 (eight) hours. For 5 more days from 08/22/15 and then change to prn Patient not taking: Reported on 07/01/2016 08/22/15   Maretta Bees, MD  albuterol (PROVENTIL) (2.5 MG/3ML) 0.083% nebulizer solution Take 3 mLs (2.5 mg total) by nebulization every 4 (four) hours as needed for wheezing or shortness of breath. Patient not taking: Reported on 07/01/2016 03/04/15   Kinnie Scales Rinehuls, PA-C  aspirin 81 MG chewable tablet Chew 1 tablet (81 mg total) by mouth daily. Patient not taking: Reported on 07/01/2016 12/01/15   Theda Belfast Dhungel, MD  carvedilol (COREG) 3.125 MG tablet Take 1 tablet (3.125 mg total) by mouth 2 (two) times daily with a meal. Patient not taking: Reported on 07/01/2016 03/04/15   Onalee Hua L Rinehuls, PA-C  feeding supplement, ENSURE ENLIVE, (ENSURE ENLIVE) LIQD Take 237 mLs by mouth 3 (three) times daily between meals. Patient not taking: Reported  on 07/01/2016 12/01/15   Nishant Dhungel, MD  ferrous sulfate 325 (65 FE) MG tablet Take 1 tablet (325 mg total) by mouth 2 (two) times daily with a meal. Patient not taking: Reported on 07/01/2016 08/22/15   Maretta Bees, MD  pantoprazole (PROTONIX) 40 MG tablet Take 1 tablet (40 mg total) by mouth daily. Patient not taking: Reported on 07/01/2016 03/04/15   Onalee Hua L Rinehuls, PA-C    senna-docusate (SENOKOT-S) 8.6-50 MG tablet Take 2 tablets by mouth at bedtime as needed for moderate constipation. Patient not taking: Reported on 07/01/2016 12/01/15   Eddie North, MD    Family History Family History  Problem Relation Age of Onset  . Congestive Heart Failure Son 37    Social History Social History  Substance Use Topics  . Smoking status: Current Every Day Smoker    Types: Cigarettes  . Smokeless tobacco: Never Used  . Alcohol use No     Allergies   Review of patient's allergies indicates no known allergies.   Review of Systems Review of Systems  Constitutional: Positive for diaphoresis and fatigue. Negative for chills and fever.  Respiratory: Positive for cough and shortness of breath.   Cardiovascular: Negative for chest pain and leg swelling.  Gastrointestinal: Negative for abdominal pain, diarrhea, nausea and vomiting.  Musculoskeletal: Positive for arthralgias, gait problem and myalgias. Negative for neck pain.  Skin: Negative for rash and wound.  Neurological: Positive for syncope and weakness (generalized). Negative for numbness and headaches.  All other systems reviewed and are negative.    Physical Exam Updated Vital Signs BP 159/74   Pulse 115   Temp 98.7 F (37.1 C) (Oral)   Resp (!) 27   Ht 5' 5.5" (1.664 m)   Wt 130 lb (59 kg)   SpO2 100%   BMI 21.30 kg/m   Physical Exam  Constitutional: She is oriented to person, place, and time. She appears well-developed and well-nourished. No distress.  HENT:  Head: Normocephalic and atraumatic.  Mouth/Throat: Oropharynx is clear and moist.  Eyes: EOM are normal. Pupils are equal, round, and reactive to light.  Neck: Normal range of motion. Neck supple.  No posterior midline cervical tenderness to palpation  Cardiovascular: Regular rhythm.   Tachycardia  Pulmonary/Chest: Effort normal. She has rales.  Rales in all lung fields  Abdominal: Soft. Bowel sounds are normal. There is  tenderness. There is no rebound and no guarding.  Musculoskeletal: She exhibits no edema or tenderness.  Patient has pain with range of motion of the left hip. Diffuse muscular tenderness to palpation without obvious deformity.  Neurological: She is alert and oriented to person, place, and time.  4/5 motor in all extremities. Sensation is grossly intact.  Skin: Skin is warm and dry. Capillary refill takes less than 2 seconds. No rash noted. No erythema.  Psychiatric: She has a normal mood and affect. Her behavior is normal.  Nursing note and vitals reviewed.    ED Treatments / Results  Labs (all labs ordered are listed, but only abnormal results are displayed) Labs Reviewed  BASIC METABOLIC PANEL - Abnormal; Notable for the following:       Result Value   CO2 21 (*)    Anion gap 16 (*)    All other components within normal limits  BRAIN NATRIURETIC PEPTIDE - Abnormal; Notable for the following:    B Natriuretic Peptide 145.8 (*)    All other components within normal limits  CK - Abnormal; Notable for the following:  Total CK 1,559 (*)    All other components within normal limits  CBG MONITORING, ED - Abnormal; Notable for the following:    Glucose-Capillary 147 (*)    All other components within normal limits  CBC  URINALYSIS, ROUTINE W REFLEX MICROSCOPIC (NOT AT [facility])  Rosezena Sensor, ED    EKG  EKG Interpretation None       Radiology Dg Chest 1 View  Result Date: 07/01/2016 CLINICAL DATA:  Pain after fall last night. EXAM: CHEST 1 VIEW COMPARISON:  November 29, 2015 FINDINGS: The heart size and mediastinal contours are within normal limits. Both lungs are clear. The visualized skeletal structures are unremarkable. IMPRESSION: No active disease. Electronically Signed   By: Gerome Sam III M.D   On: 07/01/2016 19:15   Ct Head Wo Contrast  Result Date: 07/01/2016 CLINICAL DATA:  Status post fall of the head. EXAM: CT HEAD WITHOUT CONTRAST CT CERVICAL SPINE  WITHOUT CONTRAST TECHNIQUE: Multidetector CT imaging of the head and cervical spine was performed following the standard protocol without intravenous contrast. Multiplanar CT image reconstructions of the cervical spine were also generated. COMPARISON:  None. FINDINGS: CT HEAD FINDINGS Brain: No evidence of acute infarction, hemorrhage, extra-axial collection, ventriculomegaly, or mass effect. Generalized cerebral atrophy. Periventricular white matter low attenuation likely secondary to microangiopathy. Vascular: Cerebrovascular atherosclerotic calcifications are noted. Skull: Negative for fracture or focal lesion. Sinuses/Orbits: Visualized portions of the orbits are unremarkable. Visualized portions of the paranasal sinuses and mastoid air cells are unremarkable. Other: None. CT CERVICAL SPINE FINDINGS Significant patient motion during the examination limits evaluation. C4 through C7 is nondiagnostic. Alignment: Alignment is grossly intact. Skull base and vertebrae: No acute fracture. No primary bone lesion or focal pathologic process. Soft tissues and spinal canal: No prevertebral fluid or swelling. No visible canal hematoma. Disc levels: Degenerative disc disease with disc height loss throughout the cervical spine. Upper chest: Lung apices are clear. Other: No fluid collection or hematoma. IMPRESSION: 1. No acute intracranial pathology. 2. Severely limited evaluation of the cervical spine secondary to patient motion making the levels of C4 through C7 nondiagnostic. With limitations as described above no acute osseous abnormality of the cervical spine. If there is further clinical concern regarding injury to the cervical spine, recommend repeat CT cervical spine. Electronically Signed   By: Elige Ko   On: 07/01/2016 18:52   Ct Cervical Spine Wo Contrast  Result Date: 07/01/2016 CLINICAL DATA:  Status post fall of the head. EXAM: CT HEAD WITHOUT CONTRAST CT CERVICAL SPINE WITHOUT CONTRAST TECHNIQUE:  Multidetector CT imaging of the head and cervical spine was performed following the standard protocol without intravenous contrast. Multiplanar CT image reconstructions of the cervical spine were also generated. COMPARISON:  None. FINDINGS: CT HEAD FINDINGS Brain: No evidence of acute infarction, hemorrhage, extra-axial collection, ventriculomegaly, or mass effect. Generalized cerebral atrophy. Periventricular white matter low attenuation likely secondary to microangiopathy. Vascular: Cerebrovascular atherosclerotic calcifications are noted. Skull: Negative for fracture or focal lesion. Sinuses/Orbits: Visualized portions of the orbits are unremarkable. Visualized portions of the paranasal sinuses and mastoid air cells are unremarkable. Other: None. CT CERVICAL SPINE FINDINGS Significant patient motion during the examination limits evaluation. C4 through C7 is nondiagnostic. Alignment: Alignment is grossly intact. Skull base and vertebrae: No acute fracture. No primary bone lesion or focal pathologic process. Soft tissues and spinal canal: No prevertebral fluid or swelling. No visible canal hematoma. Disc levels: Degenerative disc disease with disc height loss throughout the cervical spine. Upper chest: Lung  apices are clear. Other: No fluid collection or hematoma. IMPRESSION: 1. No acute intracranial pathology. 2. Severely limited evaluation of the cervical spine secondary to patient motion making the levels of C4 through C7 nondiagnostic. With limitations as described above no acute osseous abnormality of the cervical spine. If there is further clinical concern regarding injury to the cervical spine, recommend repeat CT cervical spine. Electronically Signed   By: Elige KoHetal  Patel   On: 07/01/2016 18:52   Dg Hip Unilat With Pelvis 2-3 Views Left  Result Date: 07/01/2016 CLINICAL DATA:  Recent fall.  Left hip pain. EXAM: DG HIP (WITH OR WITHOUT PELVIS) 2-3V LEFT COMPARISON:  11/29/2015 FINDINGS: Again noted is an  intramedullary nail in the left femur with a dynamic hip screw extending through the left femoral head and neck. Mild lucency around this dynamic hip screw. Extensive sclerosis in the left trochanteric region compatible with old trauma and callus formation. There is a single distal interlocking screw. No acute fracture in the left femur. Pelvic bony ring is intact. Superior joint space narrowing in the right hip joint. Joint space narrowing in the superior left hip joint. Left hip is located. Knee is located. IMPRESSION: No acute bone abnormality in the left femur. Left femur hardware is intact. Electronically Signed   By: Richarda OverlieAdam  Henn M.D.   On: 07/01/2016 19:17    Procedures Procedures (including critical care time)  Medications Ordered in ED Medications  sodium chloride 0.9 % bolus 500 mL (0 mLs Intravenous Stopped 07/01/16 1935)  sodium chloride 0.9 % bolus 500 mL (0 mLs Intravenous Stopped 07/01/16 2028)  sodium chloride 0.9 % bolus 500 mL (0 mLs Intravenous Stopped 07/01/16 2101)  LORazepam (ATIVAN) injection 0.5 mg (0.5 mg Intravenous Given 07/01/16 2134)     Initial Impression / Assessment and Plan / ED Course  I have reviewed the triage vital signs and the nursing notes.  Pertinent labs & imaging results that were available during my care of the patient were reviewed by me and considered in my medical decision making (see chart for details).  Clinical Course    Initiated IV fluids in the emergency department. CT head without any evidence of acute trauma. Discussed with [facility]. Will admit to observation telemetry bed. Likely will need home health consult at discharge.  Final Clinical Impressions(s) / ED Diagnoses   Final diagnoses:  Fall at home, initial encounter  Traumatic rhabdomyolysis, initial encounter [facility](HCC)    New Prescriptions New Prescriptions   No medications on file     Loren Raceravid Taraneh Metheney, MD 07/01/16 2257

## 2016-07-01 NOTE — ED Notes (Signed)
Pt agitated pulling wires and gown off. PT states she's leaving. Pt son standing at door saying "i can't do this anymore". MD aware and medication given.

## 2016-07-01 NOTE — ED Notes (Signed)
Pt became suddenly diaphoretic and sts "I am so sick" CBG 147. Per EMT pt was not responding to verbal. Pt now alert again.

## 2016-07-01 NOTE — ED Notes (Signed)
Attempted report 

## 2016-07-01 NOTE — ED Notes (Signed)
Pt son Tammy [name] [phone].

## 2016-07-01 NOTE — ED Notes (Signed)
Patient transported to X-ray 

## 2016-07-02 DIAGNOSIS — W19XXXA Unspecified fall, initial encounter: Secondary | ICD-10-CM | POA: Diagnosis not present

## 2016-07-02 DIAGNOSIS — I10 Essential (primary) hypertension: Secondary | ICD-10-CM | POA: Diagnosis not present

## 2016-07-02 DIAGNOSIS — T796XXA Traumatic ischemia of muscle, initial encounter: Secondary | ICD-10-CM | POA: Diagnosis not present

## 2016-07-02 DIAGNOSIS — R451 Restlessness and agitation: Secondary | ICD-10-CM | POA: Diagnosis not present

## 2016-07-02 DIAGNOSIS — F0391 Unspecified dementia with behavioral disturbance: Secondary | ICD-10-CM | POA: Diagnosis not present

## 2016-07-02 DIAGNOSIS — M6282 Rhabdomyolysis: Secondary | ICD-10-CM | POA: Diagnosis present

## 2016-07-02 LAB — BASIC METABOLIC PANEL
Anion gap: 11 (ref 5–15)
BUN: 22 mg/dL — ABNORMAL HIGH (ref 6–20)
CO2: 17 mmol/L — ABNORMAL LOW (ref 22–32)
Calcium: 9.7 mg/dL (ref 8.9–10.3)
Chloride: 104 mmol/L (ref 101–111)
Creatinine, Ser: 1.04 mg/dL — ABNORMAL HIGH (ref 0.44–1.00)
GFR calc Af Amer: 59 mL/min — ABNORMAL LOW (ref >60)
GFR calc non Af Amer: 51 mL/min — ABNORMAL LOW (ref >60)
Glucose, Bld: 86 mg/dL (ref 65–99)
Potassium: 4.5 mmol/L (ref 3.5–5.1)
Sodium: 132 mmol/L — ABNORMAL LOW (ref 135–145)

## 2016-07-02 LAB — CBC
HCT: 40 % (ref 36.0–46.0)
Hemoglobin: 13.2 g/dL (ref 12.0–15.0)
MCH: 31.2 pg (ref 26.0–34.0)
MCHC: 33 g/dL (ref 30.0–36.0)
MCV: 94.6 fL (ref 78.0–100.0)
Platelets: 281 10*3/uL (ref 150–400)
RBC: 4.23 MIL/uL (ref 3.87–5.11)
RDW: 12.7 % (ref 11.5–15.5)
WBC: 8.2 10*3/uL (ref 4.0–10.5)

## 2016-07-02 LAB — CK: Total CK: 1120 U/L — ABNORMAL HIGH (ref 38–234)

## 2016-07-02 MED ORDER — DOCUSATE SODIUM 100 MG PO CAPS
100.0000 mg | ORAL_CAPSULE | Freq: Two times a day (BID) | ORAL | Status: DC
  Administered 2016-07-02 – 2016-07-03 (×4): 100 mg via ORAL
  Filled 2016-07-02 (×4): qty 1

## 2016-07-02 MED ORDER — ENSURE ENLIVE PO LIQD
237.0000 mL | Freq: Three times a day (TID) | ORAL | Status: DC
  Administered 2016-07-02 – 2016-07-03 (×4): 237 mL via ORAL

## 2016-07-02 MED ORDER — ENOXAPARIN SODIUM 30 MG/0.3ML ~~LOC~~ SOLN: 30.0000 mg | SUBCUTANEOUS | Status: DC

## 2016-07-02 MED ORDER — ONDANSETRON HCL 4 MG/2ML IJ SOLN: 4.0000 mg | Freq: Four times a day (QID) | INTRAMUSCULAR | Status: DC | PRN

## 2016-07-02 MED ORDER — CARVEDILOL 3.125 MG PO TABS: 3.1250 mg | ORAL_TABLET | Freq: Two times a day (BID) | ORAL | Status: DC

## 2016-07-02 MED ORDER — MORPHINE SULFATE (PF) 2 MG/ML IV SOLN: 2.0000 mg | INTRAVENOUS | Status: DC | PRN

## 2016-07-02 MED ORDER — INFLUENZA VAC SPLIT QUAD 0.5 ML IM SUSY
0.5000 mL | PREFILLED_SYRINGE | INTRAMUSCULAR | Status: DC
  Filled 2016-07-02: qty 0.5

## 2016-07-02 MED ORDER — ENOXAPARIN SODIUM 40 MG/0.4ML ~~LOC~~ SOLN
40.0000 mg | SUBCUTANEOUS | Status: DC
  Administered 2016-07-02: 40 mg via SUBCUTANEOUS
  Filled 2016-07-02 (×2): qty 0.4

## 2016-07-02 MED ORDER — CARVEDILOL 3.125 MG PO TABS
3.1250 mg | ORAL_TABLET | Freq: Two times a day (BID) | ORAL | Status: DC
  Administered 2016-07-02 – 2016-07-03 (×3): 3.125 mg via ORAL
  Filled 2016-07-02 (×3): qty 1

## 2016-07-02 MED ORDER — SODIUM CHLORIDE 0.9 % IV SOLN
INTRAVENOUS | Status: AC
  Administered 2016-07-02: 03:00:00 via INTRAVENOUS

## 2016-07-02 MED ORDER — DIPHENHYDRAMINE HCL 25 MG PO CAPS
25.0000 mg | ORAL_CAPSULE | Freq: Every evening | ORAL | Status: DC | PRN
Start: 2016-07-02 — End: 2016-07-03
  Administered 2016-07-02: 25 mg via ORAL
  Filled 2016-07-02: qty 1

## 2016-07-02 MED ORDER — HALOPERIDOL LACTATE 5 MG/ML IJ SOLN: 5.0000 mg | Freq: Four times a day (QID) | INTRAMUSCULAR | Status: DC | PRN

## 2016-07-02 MED ORDER — ONDANSETRON HCL 4 MG PO TABS: 4.0000 mg | ORAL_TABLET | Freq: Four times a day (QID) | ORAL | Status: DC | PRN

## 2016-07-02 MED ORDER — ACETAMINOPHEN 325 MG PO TABS
650.0000 mg | ORAL_TABLET | Freq: Four times a day (QID) | ORAL | Status: DC | PRN
  Administered 2016-07-02: 650 mg via ORAL
  Filled 2016-07-02: qty 2

## 2016-07-02 MED ORDER — ACETAMINOPHEN 650 MG RE SUPP: 650.0000 mg | Freq: Four times a day (QID) | RECTAL | Status: DC | PRN

## 2016-07-02 MED ORDER — NICOTINE 21 MG/24HR TD PT24
21.0000 mg | MEDICATED_PATCH | Freq: Every day | TRANSDERMAL | Status: DC
  Administered 2016-07-02: 21 mg via TRANSDERMAL
  Filled 2016-07-02 (×2): qty 1

## 2016-07-02 MED ORDER — SENNOSIDES-DOCUSATE SODIUM 8.6-50 MG PO TABS: 2.0000 | ORAL_TABLET | Freq: Every evening | ORAL | Status: DC | PRN

## 2016-07-02 MED ORDER — SODIUM CHLORIDE 0.9% FLUSH
3.0000 mL | Freq: Two times a day (BID) | INTRAVENOUS | Status: DC
  Administered 2016-07-02 – 2016-07-03 (×3): 3 mL via INTRAVENOUS

## 2016-07-02 MED ORDER — ASPIRIN 81 MG PO CHEW
81.0000 mg | CHEWABLE_TABLET | Freq: Every day | ORAL | Status: DC
Start: 2016-07-02 — End: 2016-07-03
  Administered 2016-07-02 – 2016-07-03 (×2): 81 mg via ORAL
  Filled 2016-07-02 (×2): qty 1

## 2016-07-02 NOTE — Progress Notes (Signed)
Tammy [name] has had a unremarkable day. She was somewhat disoriented this morning to time, but she was able to tell me that she lives at home alone and that she fell and could not get up. She complains of generalized soreness, but was able to get out of bed to the bedside commode with one assist and the use of the walker.

## 2016-07-02 NOTE — Care Management Note (Signed)
Case Management Note  Patient Details  Name: Tammy [name] MRN: [id number] Date of Birth: 02/16/1941  Subjective/Objective:       Admitted to Observation for Fall at home             Action/Plan: CM talked to patient with son Casimiro NeedleMichael at the bedside. Patient lives alone; PCP is Dr Cliffton AstersWhite, has private insurance with [employer] [facility] with prescription drug coverage; pharmacy of choice [facility]; Casimiro NeedleMichael reports no problem getting her medication. Information given to the son on Life Line (medical alert system, I've fallen and cant get up); Patient was in a nursing facility when she fell and broke her hip and son thinks that she will not go back; HHC choice offered, Casimiro NeedleMichael chose [facility]. Referral given to Darl PikesSusan with [facility]; pt requested a walker with wheels at discharge. Attending Md at discharge please enter the face to face document in Epic for [facility] services. CM will continue to follow for DCP.  Expected Discharge Date:     Possibly 07/03/2016             Expected Discharge Plan:  Home w Home Health Services  Discharge planning Services  CM Consult   Choice offered to:  Patient, Adult Children  DME Arranged:  Walker rolling DME Agency:  [facility]  HH Arranged:  RN, PT, Nurse's Aide HH Agency:  [facility]  Status of Service:  In process, will continue to follow  Reola MosherChandler, Khushbu Pippen L, RN,MHA,BSN [phone] 07/02/2016, 11:06 AM

## 2016-07-02 NOTE — Evaluation (Signed)
Physical Therapy Evaluation Patient Details Name: Tammy [name] MRN: [id number] DOB: [birth year] Today's Date: 07/02/2016   History of Present Illness  Tammy [name] is a 75 y.o. female with medical history significant of HTN, insomnia, COPD, dementia, femur fx with IM nail presenting following a fall and significant time down before son found her.   Clinical Impression  Pt admitted with above diagnosis. Pt currently with functional limitations due to the deficits listed below (see PT Problem List). Pt required mod A for bed mobility as well as sit to stand. This paired with confusion leads to recommendation of SNF for safety of pt though noted in chart that pt will likely refuse SNF. Pt will benefit from skilled PT to increase their independence and safety with mobility to allow discharge to the venue listed below.       Follow Up Recommendations SNF;Supervision/Assistance - 24 hour, if pt refuses, rec HHPT and as much support at home as we can give her.     Equipment Recommendations  Rolling walker with 5" wheels    Recommendations for Other Services       Precautions / Restrictions Precautions Precautions: Fall Restrictions Weight Bearing Restrictions: No      Mobility  Bed Mobility Overal bed mobility: Needs Assistance Bed Mobility: Supine to Sit     Supine to sit: Mod assist     General bed mobility comments: mod A needed to sit upright in bed and scoot to edge. Mostly HHA given because pt did not want to be touched due to soreness but could not get to EOB on her own.   Transfers Overall transfer level: Needs assistance Equipment used: Rolling walker (2 wheeled) Transfers: Sit to/from [employer] Sit to Stand: Mod assist Stand pivot transfers: Min assist       General transfer comment: mod A to stand from bed and BSC, needed assist for fwd wt shift and to steady. Was able to pivot to [facility] and then to relciner with min A and use of  RW  Ambulation/Gait             General Gait Details: pt too fatigued after transfers and very fearful of falling  Stairs            Wheelchair Mobility    Modified Rankin (Stroke Patients Only)       Balance Overall balance assessment: Needs assistance;History of Falls Sitting-balance support: Feet supported Sitting balance-Leahy Scale: Fair     Standing balance support: Bilateral upper extremity supported Standing balance-Leahy Scale: Poor Standing balance comment: heavy reliance on RW                             Pertinent Vitals/Pain Pain Assessment: Faces Faces Pain Scale: Hurts even more Pain Location: soreness left hip and all over Pain Descriptors / Indicators: Sore Pain Intervention(s): Limited activity within patient's tolerance;Monitored during session    Home Living Family/patient expects to be discharged to:: Private residence Living Arrangements: Alone Available Help at Discharge: Family;Available PRN/intermittently Type of Home: House Home Access: Level entry     Home Layout: One level Home Equipment: Walker - 2 wheels;Wheelchair - manual;Cane - single point Additional Comments: pt reports her RW is very old and broken and she has not been able to use it    Prior Function Level of Independence: Needs assistance      ADL's / Homemaking Assistance Needed: per chart, son helps pt shower.  She does not drive, son helps her with home mgmt  Comments: pt reports one of her sons passed away and she has never slept well since then, Her husband passed away 9 yrs ago     Hand Dominance   Dominant Hand: Right    Extremity/Trunk Assessment   Upper Extremity Assessment: Generalized weakness           Lower Extremity Assessment: Generalized weakness      Cervical / Trunk Assessment: Kyphotic  Communication   Communication: No difficulties (no teeth)  Cognition Arousal/Alertness: Awake/alert Behavior During Therapy: WFL for  tasks assessed/performed Overall Cognitive Status: No family/caregiver present to determine baseline cognitive functioning Area of Impairment: Problem solving     Memory: Decreased short-term memory       Problem Solving: Slow processing;Requires verbal cues General Comments: expect that pt is close to baseline but no family present. Repeated same 3 or 4 stories several times throughout session. Decreased safety awareness noted     General Comments General comments (skin integrity, edema, etc.): pt with large BM, RN notified. Pt reports that at home she usually has to manually disimpact in order to have a BM    Exercises     Assessment/Plan    PT Assessment Patient needs continued PT services  PT Problem List Decreased strength;Decreased activity tolerance;Decreased balance;Decreased mobility;Decreased cognition;Decreased knowledge of use of DME;Decreased knowledge of precautions;Pain          PT Treatment Interventions DME instruction;Gait training;Functional mobility training;Therapeutic activities;Therapeutic exercise;Balance training;Cognitive remediation;Patient/family education    PT Goals (Current goals can be found in the Care Plan section)  Acute Rehab PT Goals Patient Stated Goal: return home PT Goal Formulation: With patient Time For Goal Achievement: 08/15/16 Potential to Achieve Goals: Good    Frequency Min 3X/week   Barriers to discharge Decreased caregiver support unsure how much son can be with her    Co-evaluation               End of Session Equipment Utilized During Treatment: Gait belt Activity Tolerance: Patient tolerated treatment well Patient left: in chair;with call bell/phone within reach;with chair alarm set Nurse Communication: Mobility status    Functional Assessment Tool Used: clinical judgement Functional Limitation: Mobility: Walking and moving around Mobility: Walking and Moving Around Current Status ([id number]): At least 20  percent but less than 40 percent impaired, limited or restricted Mobility: Walking and Moving Around Goal Status [phone]([id number]): At least 1 percent but less than 20 percent impaired, limited or restricted    Time: 1441-1509 PT Time Calculation (min) (ACUTE ONLY): 28 min   Charges:   PT Evaluation $PT Eval Moderate Complexity: 1 Procedure PT Treatments $Therapeutic Activity: 8-22 mins   PT G Codes:   PT G-Codes **NOT FOR INPATIENT CLASS** Functional Assessment Tool Used: clinical judgement Functional Limitation: Mobility: Walking and moving around Mobility: Walking and Moving Around Current Status ([id number](G8978): At least 20 percent but less than 40 percent impaired, limited or restricted Mobility: Walking and Moving Around Goal Status [phone]([id number]): At least 1 percent but less than 20 percent impaired, limited or restricted   Lyanne CoVictoria Brice Kossman, PT  Acute Rehab Services  [phone]  Lyanne CoManess, Jyaire Koudelka 07/02/2016, 3:22 PM

## 2016-07-02 NOTE — Plan of Care (Signed)
Problem: Safety: Goal: Ability to remain free from injury will improve Outcome: Progressing Frequent rounding in place to address toileting. Bed alram in use with yellow socks

## 2016-07-02 NOTE — Progress Notes (Signed)
PROGRESS NOTE    [name]  ZOX:[id number] DOB: May 03, 1941 DOA: 07/01/2016 PCP: Cala Bradford, MD      Brief Narrative:  Tammy [name] is a 75 y.o. female with medical history significant of HTN, insomnia presenting following a fall. She has dementia and is a poor historian. History is provided alternately by herself and her son. She reports that her son thought she needed to go to the doctor. Fell into floor overnight, son found her about 2pm. Unable to get up, hurting all over. H /o prior hip and ankle fratures, has chronic pain in those joints. CT head without any evidence of acute trauma.    Assessment & Plan:   Principal Problem:   Fall at home Active Problems:   Tobacco abuse   HTN (hypertension)   Agitation   Dementia   Tachycardia   Rhabdomyolysis  Fall resulting in rhabdomyolysis -Patient with mechanical fall overnight and inability to get up, resulting in CK of 1559 and generalized pain -CT head, cervical spine, pelvic xray WNL  -IVF  -CK trending down -PT eval: SNF  Dementia with agitation -Mild to moderate dementia, likely does not have the cognitive ability to have capacity -Haldol prn agitation -Needs serious consideration about whether she remains safe to be living alone  Tobacco dependence -Encourage cessation -Patch ordered  Tachycardia -May be related to rhabdo, but patient also reports not taking her Coreg - so this could also be rebound tachycardia -Will resume Coreg, rehydrate, and follow HR -Will monitor on telemetry overnight  HTN -Resume Coreg -Goal SBP < 160  DVT prophylaxis: Lovenox  Code Status:  Full - confirmed with patient/family Family Communication: spoke with son over the phone Disposition Plan: PT is recommending SNF but patient's son refused as patient has been there twice and has had issues. Will likely go home tmrw Am. Son will get Dependent life button for her for safety concerns.     Consultants:    None  Procedures:   None  Antimicrobials:   None     Subjective: Patient doing well this morning. No complaints. Worked with PT  Objective: Vitals:   07/02/16 0155 07/02/16 0601 07/02/16 0900 07/02/16 1200  BP: (!) 144/67 (!) 146/62 130/60 (!) 107/41  Pulse: (!) 102 (!) 113 (!) 109 (!) 113  Resp: 20 20 20 20   Temp: 98.2 F (36.8 C) 98.1 F (36.7 C) 98.8 F (37.1 C) 97.5 F (36.4 C)  TempSrc: Oral Oral Oral Oral  SpO2: 99% 100% 100% 98%  Weight: 54.3 kg (119 lb 11.2 oz)     Height:        Intake/Output Summary (Last 24 hours) at 07/02/16 1536 Last data filed at 07/02/16 1043  Gross per 24 hour  Intake             2290 ml  Output              101 ml  Net             2189 ml   Filed Weights   07/01/16 1613 07/02/16 0155  Weight: 59 kg (130 lb) 54.3 kg (119 lb 11.2 oz)    Examination:  General exam: Appears calm and comfortable  Respiratory system: Clear to auscultation. Respiratory effort normal. Cardiovascular system: S1 & S2 heard, RRR. No JVD, murmurs, rubs, gallops or clicks. No pedal edema. Gastrointestinal system: Abdomen is nondistended, soft and nontender. No organomegaly or masses felt. Normal bowel sounds heard. Central nervous system: Alert and  oriented to self . No focal neurological deficits. Extremities: Symmetric 5 x 5 power. Skin: No rashes, lesions or ulcers  Data Reviewed: I have personally reviewed following labs and imaging studies  CBC:  Recent Labs Lab 07/01/16 1640 07/02/16 1031  WBC 8.9 8.2  HGB 14.1 13.2  HCT 41.6 40.0  MCV 93.5 94.6  PLT 283 281   Basic Metabolic Panel:  Recent Labs Lab 07/01/16 1640 07/02/16 1031  NA 143 132*  K 4.0 4.5  CL 106 104  CO2 21* 17*  GLUCOSE 95 86  BUN 9 22*  CREATININE 0.83 1.04*  CALCIUM 9.2 9.7   GFR: Estimated Creatinine Clearance: 39.4 mL/min (by C-G formula based on SCr of 1.04 mg/dL (H)). Liver Function Tests: No results for input(s): AST, ALT, ALKPHOS, BILITOT, PROT,  ALBUMIN in the last 168 hours. No results for input(s): LIPASE, AMYLASE in the last 168 hours. No results for input(s): AMMONIA in the last 168 hours. Coagulation Profile: No results for input(s): INR, PROTIME in the last 168 hours. Cardiac Enzymes:  Recent Labs Lab 07/01/16 1738 07/02/16 1031  CKTOTAL 1,559* 1,120*   BNP (last 3 results) No results for input(s): PROBNP in the last 8760 hours. HbA1C: No results for input(s): HGBA1C in the last 72 hours. CBG:  Recent Labs Lab 07/01/16 1632  GLUCAP 147*   Lipid Profile: No results for input(s): CHOL, HDL, LDLCALC, TRIG, CHOLHDL, LDLDIRECT in the last 72 hours. Thyroid Function Tests: No results for input(s): TSH, T4TOTAL, FREET4, T3FREE, THYROIDAB in the last 72 hours. Anemia Panel: No results for input(s): VITAMINB12, FOLATE, FERRITIN, TIBC, IRON, RETICCTPCT in the last 72 hours. Sepsis Labs: No results for input(s): PROCALCITON, LATICACIDVEN in the last 168 hours.  No results found for this or any previous visit (from the past 240 hour(s)).       Radiology Studies: Dg Chest 1 View  Result Date: 07/01/2016 CLINICAL DATA:  Pain after fall last night. EXAM: CHEST 1 VIEW COMPARISON:  November 29, 2015 FINDINGS: The heart size and mediastinal contours are within normal limits. Both lungs are clear. The visualized skeletal structures are unremarkable. IMPRESSION: No active disease. Electronically Signed   By: Gerome Sam III M.D   On: 07/01/2016 19:15   Ct Head Wo Contrast  Result Date: 07/01/2016 CLINICAL DATA:  Status post fall of the head. EXAM: CT HEAD WITHOUT CONTRAST CT CERVICAL SPINE WITHOUT CONTRAST TECHNIQUE: Multidetector CT imaging of the head and cervical spine was performed following the standard protocol without intravenous contrast. Multiplanar CT image reconstructions of the cervical spine were also generated. COMPARISON:  None. FINDINGS: CT HEAD FINDINGS Brain: No evidence of acute infarction, hemorrhage,  extra-axial collection, ventriculomegaly, or mass effect. Generalized cerebral atrophy. Periventricular white matter low attenuation likely secondary to microangiopathy. Vascular: Cerebrovascular atherosclerotic calcifications are noted. Skull: Negative for fracture or focal lesion. Sinuses/Orbits: Visualized portions of the orbits are unremarkable. Visualized portions of the paranasal sinuses and mastoid air cells are unremarkable. Other: None. CT CERVICAL SPINE FINDINGS Significant patient motion during the examination limits evaluation. C4 through C7 is nondiagnostic. Alignment: Alignment is grossly intact. Skull base and vertebrae: No acute fracture. No primary bone lesion or focal pathologic process. Soft tissues and spinal canal: No prevertebral fluid or swelling. No visible canal hematoma. Disc levels: Degenerative disc disease with disc height loss throughout the cervical spine. Upper chest: Lung apices are clear. Other: No fluid collection or hematoma. IMPRESSION: 1. No acute intracranial pathology. 2. Severely limited evaluation of the cervical spine  secondary to patient motion making the levels of C4 through C7 nondiagnostic. With limitations as described above no acute osseous abnormality of the cervical spine. If there is further clinical concern regarding injury to the cervical spine, recommend repeat CT cervical spine. Electronically Signed   By: Elige KoHetal  Patel   On: 07/01/2016 18:52   Ct Cervical Spine Wo Contrast  Result Date: 07/01/2016 CLINICAL DATA:  Status post fall of the head. EXAM: CT HEAD WITHOUT CONTRAST CT CERVICAL SPINE WITHOUT CONTRAST TECHNIQUE: Multidetector CT imaging of the head and cervical spine was performed following the standard protocol without intravenous contrast. Multiplanar CT image reconstructions of the cervical spine were also generated. COMPARISON:  None. FINDINGS: CT HEAD FINDINGS Brain: No evidence of acute infarction, hemorrhage, extra-axial collection,  ventriculomegaly, or mass effect. Generalized cerebral atrophy. Periventricular white matter low attenuation likely secondary to microangiopathy. Vascular: Cerebrovascular atherosclerotic calcifications are noted. Skull: Negative for fracture or focal lesion. Sinuses/Orbits: Visualized portions of the orbits are unremarkable. Visualized portions of the paranasal sinuses and mastoid air cells are unremarkable. Other: None. CT CERVICAL SPINE FINDINGS Significant patient motion during the examination limits evaluation. C4 through C7 is nondiagnostic. Alignment: Alignment is grossly intact. Skull base and vertebrae: No acute fracture. No primary bone lesion or focal pathologic process. Soft tissues and spinal canal: No prevertebral fluid or swelling. No visible canal hematoma. Disc levels: Degenerative disc disease with disc height loss throughout the cervical spine. Upper chest: Lung apices are clear. Other: No fluid collection or hematoma. IMPRESSION: 1. No acute intracranial pathology. 2. Severely limited evaluation of the cervical spine secondary to patient motion making the levels of C4 through C7 nondiagnostic. With limitations as described above no acute osseous abnormality of the cervical spine. If there is further clinical concern regarding injury to the cervical spine, recommend repeat CT cervical spine. Electronically Signed   By: Elige KoHetal  Patel   On: 07/01/2016 18:52   Dg Hip Unilat With Pelvis 2-3 Views Left  Result Date: 07/01/2016 CLINICAL DATA:  Recent fall.  Left hip pain. EXAM: DG HIP (WITH OR WITHOUT PELVIS) 2-3V LEFT COMPARISON:  11/29/2015 FINDINGS: Again noted is an intramedullary nail in the left femur with a dynamic hip screw extending through the left femoral head and neck. Mild lucency around this dynamic hip screw. Extensive sclerosis in the left trochanteric region compatible with old trauma and callus formation. There is a single distal interlocking screw. No acute fracture in the left  femur. Pelvic bony ring is intact. Superior joint space narrowing in the right hip joint. Joint space narrowing in the superior left hip joint. Left hip is located. Knee is located. IMPRESSION: No acute bone abnormality in the left femur. Left femur hardware is intact. Electronically Signed   By: Richarda OverlieAdam  Henn M.D.   On: 07/01/2016 19:17        Scheduled Meds: . aspirin  81 mg Oral Daily  . carvedilol  3.125 mg Oral BID WC  . docusate sodium  100 mg Oral BID  . enoxaparin (LOVENOX) injection  40 mg Subcutaneous Q24H  . feeding supplement (ENSURE ENLIVE)  237 mL Oral TID BM  . nicotine  21 mg Transdermal Daily  . sodium chloride flush  3 mL Intravenous Q12H   Continuous Infusions:    LOS: 0 days    Time spent: 40 minutes    Noralee StainJennifer Chakita Mcgraw, DO [facility] Pager [phone]  If 7PM-7AM, please contact night-coverage [email or web address] Password [email or web address] 07/02/2016, 3:36 PM

## 2016-07-02 NOTE — Care Management Obs Status (Signed)
MEDICARE OBSERVATION STATUS NOTIFICATION   Patient Details  Name: Tammy [name] MRN: [id number] Date of Birth: 1941/08/23   Medicare Observation Status Notification Given:  Yes    Cherrie DistanceChandler, Ryshawn Sanzone L, RN 07/02/2016, 11:04 AM

## 2016-07-03 DIAGNOSIS — W19XXXA Unspecified fall, initial encounter: Secondary | ICD-10-CM | POA: Diagnosis not present

## 2016-07-03 DIAGNOSIS — T796XXA Traumatic ischemia of muscle, initial encounter: Secondary | ICD-10-CM | POA: Diagnosis not present

## 2016-07-03 DIAGNOSIS — F0391 Unspecified dementia with behavioral disturbance: Secondary | ICD-10-CM | POA: Diagnosis not present

## 2016-07-03 DIAGNOSIS — I10 Essential (primary) hypertension: Secondary | ICD-10-CM | POA: Diagnosis not present

## 2016-07-03 LAB — BASIC METABOLIC PANEL
Anion gap: 7 (ref 5–15)
BUN: 30 mg/dL — ABNORMAL HIGH (ref 6–20)
CO2: 22 mmol/L (ref 22–32)
Calcium: 9.4 mg/dL (ref 8.9–10.3)
Chloride: 105 mmol/L (ref 101–111)
Creatinine, Ser: 1 mg/dL (ref 0.44–1.00)
GFR calc Af Amer: >60 mL/min (ref >60)
GFR calc non Af Amer: 53 mL/min — ABNORMAL LOW (ref >60)
Glucose, Bld: 94 mg/dL (ref 65–99)
Potassium: 4.5 mmol/L (ref 3.5–5.1)
Sodium: 134 mmol/L — ABNORMAL LOW (ref 135–145)

## 2016-07-03 LAB — CK: Total CK: 623 U/L — ABNORMAL HIGH (ref 38–234)

## 2016-07-03 NOTE — Discharge Summary (Signed)
Physician Discharge Summary  [name] ZOX:[id number] DOB: [birth year] DOA: 07/01/2016  PCP: Cala Bradford, MD  Admit date: 07/01/2016 Discharge date: 07/03/2016  Admitted From: Home Disposition:  Home. SNF was recommended but patient and son refused.   Recommendations for Outpatient Follow-up:  1. Follow up with PCP in 1-2 weeks  Home Health: Yes HHPT  Equipment/Devices: Walker    Discharge Condition: Stable CODE STATUS: Full  Diet recommendation: Regular   Brief/Interim Summary: Tammy [name] a 75 y.o.femalewith medical history significant of HTN, insomnia presenting following a fall.She has dementia and is a poor historian. History is provided alternately by herself and her son. She reports that her son thought she needed to go to the doctor. Fell into floor overnight, son found her about 76pm. Unable to get up, hurting all over. H/o prior hip and ankle fratures, has chronic pain in those joints. CT head without any evidence of acute trauma. 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 She was treated with IV fluids and her CK levels trended down. During admission, patient also had sinus tachycardia. It was questionable whether it was related to rhabdomyolysis versus rebound tachycardia from not taking home Coreg. Patient was hydrated and be started on Coreg. However, patient continued to have heart rate in the low 100s. Patient was completely asymptomatic with sinus tachycardia and denied any chest discomfort or shortness of breath. This should be followed up outpatient. Physical therapy worked with the patient. They did recommend skilled nursing facility. However, patient and son refused and opted to take patient home with home health physical therapy. Patient is at high risk for falls.   Discharge Diagnoses:  Principal Problem:   Fall at home Active Problems:   Tobacco abuse   HTN (hypertension)   Agitation   Dementia   Tachycardia    Rhabdomyolysis  Fall resulting in rhabdomyolysis -Patient with mechanical fall and inability to get up, resulting in CK of 1559 and generalized pain -CT head, cervical spine, pelvic xray WNL  -IVF  -CK trending down -PT eval: SNF recommended   Dementia with agitation -Mild to moderate dementia, likely does not have the cognitive ability to have capacity -Haldol prn agitation  Tobacco dependence -Encourage cessation -Patch ordered  Sinus tachycardia -May be related to rhabdo, but patient also reports not taking her Coreg - so this could also be rebound tachycardia -Will resume Coreg, rehydrate, and follow HR -Patient without any symptoms of chest pain, shortness of breath. Less likely pulmonary embolism. Would favor close monitoring outpatient for heart rate.  HTN -Resume Coreg -Goal SBP < 160   Discharge Instructions  Discharge Instructions    Diet - low sodium heart healthy    Complete by:  As directed    Increase activity slowly    Complete by:  As directed        Medication List    TAKE these medications   aspirin 81 MG chewable tablet Chew 1 tablet (81 mg total) by mouth daily.   carvedilol 3.125 MG tablet Commonly known as:  COREG Take 1 tablet (3.125 mg total) by mouth 2 (two) times daily with a meal.   feeding supplement (ENSURE ENLIVE) Liqd Take 237 mLs by mouth 3 (three) times daily between meals.   senna-docusate 8.6-50 MG tablet Commonly known as:  Senokot-S Take 2 tablets by mouth at bedtime as needed for moderate constipation.      Follow-up Information    [facility] .   Why:  A home health nurse, physical therapist and a nurses aide will go to your home for therapy at discharge Contact information: [address] [place] [address] [phone]          No Known Allergies  Consultations:  None   Procedures/Studies: Dg Chest 1 View  Result Date: 07/01/2016 CLINICAL DATA:  Pain after fall last  night. EXAM: CHEST 1 VIEW COMPARISON:  November 29, 2015 FINDINGS: The heart size and mediastinal contours are within normal limits. Both lungs are clear. The visualized skeletal structures are unremarkable. IMPRESSION: No active disease. Electronically Signed   By: Gerome Sam III M.D   On: 07/01/2016 19:15   Ct Head Wo Contrast  Result Date: 07/01/2016 CLINICAL DATA:  Status post fall of the head. EXAM: CT HEAD WITHOUT CONTRAST CT CERVICAL SPINE WITHOUT CONTRAST TECHNIQUE: Multidetector CT imaging of the head and cervical spine was performed following the standard protocol without intravenous contrast. Multiplanar CT image reconstructions of the cervical spine were also generated. COMPARISON:  None. FINDINGS: CT HEAD FINDINGS Brain: No evidence of acute infarction, hemorrhage, extra-axial collection, ventriculomegaly, or mass effect. Generalized cerebral atrophy. Periventricular white matter low attenuation likely secondary to microangiopathy. Vascular: Cerebrovascular atherosclerotic calcifications are noted. Skull: Negative for fracture or focal lesion. Sinuses/Orbits: Visualized portions of the orbits are unremarkable. Visualized portions of the paranasal sinuses and mastoid air cells are unremarkable. Other: None. CT CERVICAL SPINE FINDINGS Significant patient motion during the examination limits evaluation. C4 through C7 is nondiagnostic. Alignment: Alignment is grossly intact. Skull base and vertebrae: No acute fracture. No primary bone lesion or focal pathologic process. Soft tissues and spinal canal: No prevertebral fluid or swelling. No visible canal hematoma. Disc levels: Degenerative disc disease with disc height loss throughout the cervical spine. Upper chest: Lung apices are clear. Other: No fluid collection or hematoma. IMPRESSION: 1. No acute intracranial pathology. 2. Severely limited evaluation of the cervical spine secondary to patient motion making the levels of C4 through C7  nondiagnostic. With limitations as described above no acute osseous abnormality of the cervical spine. If there is further clinical concern regarding injury to the cervical spine, recommend repeat CT cervical spine. Electronically Signed   By: Elige Ko   On: 07/01/2016 18:52   Ct Cervical Spine Wo Contrast  Result Date: 07/01/2016 CLINICAL DATA:  Status post fall of the head. EXAM: CT HEAD WITHOUT CONTRAST CT CERVICAL SPINE WITHOUT CONTRAST TECHNIQUE: Multidetector CT imaging of the head and cervical spine was performed following the standard protocol without intravenous contrast. Multiplanar CT image reconstructions of the cervical spine were also generated. COMPARISON:  None. FINDINGS: CT HEAD FINDINGS Brain: No evidence of acute infarction, hemorrhage, extra-axial collection, ventriculomegaly, or mass effect. Generalized cerebral atrophy. Periventricular white matter low attenuation likely secondary to microangiopathy. Vascular: Cerebrovascular atherosclerotic calcifications are noted. Skull: Negative for fracture or focal lesion. Sinuses/Orbits: Visualized portions of the orbits are unremarkable. Visualized portions of the paranasal sinuses and mastoid air cells are unremarkable. Other: None. CT CERVICAL SPINE FINDINGS Significant patient motion during the examination limits evaluation. C4 through C7 is nondiagnostic. Alignment: Alignment is grossly intact. Skull base and vertebrae: No acute fracture. No primary bone lesion or focal pathologic process. Soft tissues and spinal canal: No prevertebral fluid or swelling. No visible canal hematoma. Disc levels: Degenerative disc disease with disc height loss throughout the cervical spine. Upper chest: Lung apices are clear. Other: No fluid collection or hematoma. IMPRESSION: 1. No acute intracranial pathology. 2. Severely limited evaluation  of the cervical spine secondary to patient motion making the levels of C4 through C7 nondiagnostic. With limitations as  described above no acute osseous abnormality of the cervical spine. If there is further clinical concern regarding injury to the cervical spine, recommend repeat CT cervical spine. Electronically Signed   By: Elige KoHetal  Patel   On: 07/01/2016 18:52   Dg Hip Unilat With Pelvis 2-3 Views Left  Result Date: 07/01/2016 CLINICAL DATA:  Recent fall.  Left hip pain. EXAM: DG HIP (WITH OR WITHOUT PELVIS) 2-3V LEFT COMPARISON:  11/29/2015 FINDINGS: Again noted is an intramedullary nail in the left femur with a dynamic hip screw extending through the left femoral head and neck. Mild lucency around this dynamic hip screw. Extensive sclerosis in the left trochanteric region compatible with old trauma and callus formation. There is a single distal interlocking screw. No acute fracture in the left femur. Pelvic bony ring is intact. Superior joint space narrowing in the right hip joint. Joint space narrowing in the superior left hip joint. Left hip is located. Knee is located. IMPRESSION: No acute bone abnormality in the left femur. Left femur hardware is intact. Electronically Signed   By: Richarda OverlieAdam  Henn M.D.   On: 07/01/2016 19:17     Subjective: Doing well this morning. No complaints  Discharge Exam: Vitals:   07/03/16 0754 07/03/16 1148  BP: (!) 132/51 (!) 158/66  Pulse: (!) 106 (!) 112  Resp: (!) 22 18  Temp: 97.9 F (36.6 C) 98.2 F (36.8 C)   Vitals:   07/02/16 2115 07/03/16 0518 07/03/16 0754 07/03/16 1148  BP: (!) 159/63 (!) 107/57 (!) 132/51 (!) 158/66  Pulse: (!) 112 62 (!) 106 (!) 112  Resp: 20 20 (!) 22 18  Temp: 99.5 F (37.5 C) 98.5 F (36.9 C) 97.9 F (36.6 C) 98.2 F (36.8 C)  TempSrc: Oral Oral Oral Oral  SpO2: 99% 98% 97% 100%  Weight:  54 kg (119 lb)    Height:        General: Pt is alert, awake, not in acute distress Cardiovascular: tachycardic, regular rhythm, S1/S2 +, no rubs, no gallops Respiratory: CTA bilaterally, no wheezing, no rhonchi Abdominal: Soft, NT, ND, bowel  sounds + Extremities: no edema, no cyanosis    The results of significant diagnostics from this hospitalization (including imaging, microbiology, ancillary and laboratory) are listed below for reference.     Microbiology: No results found for this or any previous visit (from the past 240 hour(s)).   Labs: BNP (last 3 results)  Recent Labs  07/01/16 1738  BNP 145.8*   Basic Metabolic Panel:  Recent Labs Lab 07/01/16 1640 07/02/16 1031 07/03/16 0352  NA 143 132* 134*  K 4.0 4.5 4.5  CL 106 104 105  CO2 21* 17* 22  GLUCOSE 95 86 94  BUN 9 22* 30*  CREATININE 0.83 1.04* 1.00  CALCIUM 9.2 9.7 9.4   Liver Function Tests: No results for input(s): AST, ALT, ALKPHOS, BILITOT, PROT, ALBUMIN in the last 168 hours. No results for input(s): LIPASE, AMYLASE in the last 168 hours. No results for input(s): AMMONIA in the last 168 hours. CBC:  Recent Labs Lab 07/01/16 1640 07/02/16 1031  WBC 8.9 8.2  HGB 14.1 13.2  HCT 41.6 40.0  MCV 93.5 94.6  PLT 283 281   Cardiac Enzymes:  Recent Labs Lab 07/01/16 1738 07/02/16 1031 07/03/16 0352  CKTOTAL 1,559* 1,120* 623*   BNP: Invalid input(s): POCBNP CBG:  Recent Labs Lab 07/01/16 1632  GLUCAP 147*   D-Dimer No results for input(s): DDIMER in the last 72 hours. Hgb A1c No results for input(s): HGBA1C in the last 72 hours. Lipid Profile No results for input(s): CHOL, HDL, LDLCALC, TRIG, CHOLHDL, LDLDIRECT in the last 72 hours. Thyroid function studies No results for input(s): TSH, T4TOTAL, T3FREE, THYROIDAB in the last 72 hours.  Invalid input(s): FREET3 Anemia work up No results for input(s): VITAMINB12, FOLATE, FERRITIN, TIBC, IRON, RETICCTPCT in the last 72 hours. Urinalysis    Component Value Date/Time   COLORURINE YELLOW 07/01/2016 2221   APPEARANCEUR CLEAR 07/01/2016 2221   LABSPEC 1.015 07/01/2016 2221   PHURINE 6.0 07/01/2016 2221   GLUCOSEU NEGATIVE 07/01/2016 2221   HGBUR NEGATIVE 07/01/2016  2221   BILIRUBINUR NEGATIVE 07/01/2016 2221   KETONESUR NEGATIVE 07/01/2016 2221   PROTEINUR NEGATIVE 07/01/2016 2221   UROBILINOGEN 0.2 08/19/2015 0756   NITRITE NEGATIVE 07/01/2016 2221   LEUKOCYTESUR NEGATIVE 07/01/2016 2221   Sepsis Labs Invalid input(s): PROCALCITONIN,  WBC,  LACTICIDVEN Microbiology No results found for this or any previous visit (from the past 240 hour(s)).   Time coordinating discharge: Over 30 minutes  SIGNED:  Noralee Stain, DO [facility] Pager [phone]  If 7PM-7AM, please contact night-coverage [email or web address] Password [email or web address] 07/03/2016, 2:05 PM

## 2016-07-03 NOTE — Clinical Social Work Note (Addendum)
CSW called and left voicemail for patient's son to discuss PT recommendations and discharge planning.  RNCM already working on HHPT.  Charlynn CourtSarah Cloyce Paterson, CSW [phone]  2:51 pm Per MD discharge summary, patient and her son refused SNF. Patient will discharge home with HHPT.   CSW signing off. Consult again if any social work needs arise.  Charlynn CourtSarah Alison Breeding, CSW [phone]

## 2016-07-03 NOTE — Progress Notes (Signed)
Patient and son given discharge instructions and all questions answered.  Patient discharged via wheelchair with all belongings. 

## 2016-07-03 NOTE — Progress Notes (Signed)
Jermane with [facility] called for rolling walker to be delivered to the room today prior to discharging home. Abelino DerrickB Zuzu Befort Gastroenterology Associates LLCRN,MHA,BSN [phone]

## 2016-07-03 NOTE — Progress Notes (Signed)
Physical Therapy Treatment Patient Details Name: Tammy FallingVirginia T [name] MRN: [id number] DOB: [birth year] Today's Date: 07/03/2016    History of Present Illness Tammy [name] is a 75 y.o. female with medical history significant of HTN, insomnia, COPD, dementia, femur fx with IM nail presenting following a fall and significant time down before son found her.     PT Comments    Pt is still quite unsafe, due to her cognition and her sudden LOB with pt being unaware of her issues.  SNF is still her best option due to her family need for 24/7 care otherwise with possibly 2 at first to control LOB.  Continue acutely as needed for strengthening and balance work.  Follow Up Recommendations  SNF     Equipment Recommendations  Rolling walker with 5" wheels    Recommendations for Other Services       Precautions / Restrictions Precautions Precautions: Fall Restrictions Weight Bearing Restrictions: No    Mobility  Bed Mobility                  Transfers Overall transfer level: Needs assistance Equipment used: Rolling walker (2 wheeled) Transfers: Sit to/from Stand;Stand Pivot Transfers Sit to Stand: Min assist Stand pivot transfers: Min assist       General transfer comment: min for powering up and to turn  Ambulation/Gait Ambulation/Gait assistance: Min assist;Mod assist Ambulation Distance (Feet): 100 Feet Assistive device: Rolling walker (2 wheeled);1 person hand held assist Gait Pattern/deviations: Step-through pattern;Wide base of support;Shuffle Gait velocity: reduced Gait velocity interpretation: Below normal speed for age/gender General Gait Details: Pt had 3 moments of sudden [name] backward with no reaction on her face,    Stairs            Wheelchair Mobility    Modified Rankin (Stroke Patients Only)       Balance Overall balance assessment: Needs assistance Sitting-balance support: Feet supported Sitting balance-Leahy Scale: Fair      Standing balance support: Bilateral upper extremity supported Standing balance-Leahy Scale: Poor                      Cognition                            Exercises      General Comments General comments (skin integrity, edema, etc.): Pt is unstable in unpredictable moments, should not be up to stand or walk alone      Pertinent Vitals/Pain      Home Living                      Prior Function            PT Goals (current goals can now be found in the care plan section) Acute Rehab PT Goals Patient Stated Goal: return home Progress towards PT goals: Progressing toward goals    Frequency    Min 3X/week      PT Plan Current plan remains appropriate    Co-evaluation             End of Session Equipment Utilized During Treatment: Gait belt Activity Tolerance: Patient tolerated treatment well Patient left: in bed;with call bell/phone within reach;with bed alarm set     Time: [id number] PT Time Calculation (min) (ACUTE ONLY): 34 min  Charges:  $Gait Training: 8-22 mins $Therapeutic Activity: 8-22 mins  G CodesIvar [name] 07/03/2016, 12:39 PM    Tammy [name], PT MS Acute Rehab Dept. Number: [facility] [id number] and [facility] [phone]

## 2016-07-03 NOTE — Progress Notes (Signed)
Attempted to contact patient's son with updates for discharge but no response.  Noralee StainJennifer Arrian Manson, DO [facility]

## 2016-09-06 ENCOUNTER — Emergency Department (HOSPITAL_COMMUNITY)
Admission: EM | Admit: 2016-09-06 | Discharge: 2016-09-06 | Disposition: A | Discharge status: Home / Self Care | Payer: Medicare Other | Attending: Dermatology | Admitting: Dermatology

## 2016-09-06 ENCOUNTER — Encounter (HOSPITAL_COMMUNITY): Payer: Self-pay | Admitting: *Deleted

## 2016-09-06 DIAGNOSIS — Z5321 Procedure and treatment not carried out due to patient leaving prior to being seen by health care provider: Secondary | ICD-10-CM | POA: Diagnosis not present

## 2016-09-06 DIAGNOSIS — F419 Anxiety disorder, unspecified: Secondary | ICD-10-CM | POA: Diagnosis not present

## 2016-09-06 NOTE — ED Triage Notes (Signed)
The pts son has decided to take his mother home.  She is sleepy and she does not want to stay any longer

## 2016-09-06 NOTE — ED Triage Notes (Addendum)
Pt called son and told him she wanted to come to the hospital, now states, "want to go home and go to bed", denies any sx, states, "no complaints", "do not want to stay", states, "I wasn't feeling well, now I feel fine". Son at side. Pt alert, NAD, calm, interactive, skin W&D, no dyspnea noted.

## 2016-09-17 ENCOUNTER — Inpatient Hospital Stay (HOSPITAL_COMMUNITY)
Admission: EM | Admit: 2016-09-17 | Discharge: 2016-09-22 | DRG: 682 | Disposition: A | Discharge status: Home Health | Payer: Medicare Other | Attending: Internal Medicine | Admitting: Internal Medicine

## 2016-09-17 ENCOUNTER — Encounter (HOSPITAL_COMMUNITY): Payer: Self-pay | Admitting: Neurology

## 2016-09-17 ENCOUNTER — Emergency Department (HOSPITAL_COMMUNITY): Payer: Medicare Other

## 2016-09-17 DIAGNOSIS — N179 Acute kidney failure, unspecified: Principal | ICD-10-CM | POA: Diagnosis present

## 2016-09-17 DIAGNOSIS — R4182 Altered mental status, unspecified: Secondary | ICD-10-CM | POA: Diagnosis not present

## 2016-09-17 DIAGNOSIS — I639 Cerebral infarction, unspecified: Secondary | ICD-10-CM | POA: Diagnosis present

## 2016-09-17 DIAGNOSIS — F05 Delirium due to known physiological condition: Secondary | ICD-10-CM | POA: Diagnosis present

## 2016-09-17 DIAGNOSIS — D649 Anemia, unspecified: Secondary | ICD-10-CM | POA: Diagnosis present

## 2016-09-17 DIAGNOSIS — N390 Urinary tract infection, site not specified: Secondary | ICD-10-CM | POA: Diagnosis present

## 2016-09-17 DIAGNOSIS — N183 Chronic kidney disease, stage 3 (moderate): Secondary | ICD-10-CM | POA: Diagnosis present

## 2016-09-17 DIAGNOSIS — F0390 Unspecified dementia without behavioral disturbance: Secondary | ICD-10-CM | POA: Diagnosis present

## 2016-09-17 DIAGNOSIS — E860 Dehydration: Secondary | ICD-10-CM | POA: Diagnosis present

## 2016-09-17 DIAGNOSIS — R0902 Hypoxemia: Secondary | ICD-10-CM | POA: Diagnosis present

## 2016-09-17 DIAGNOSIS — I5042 Chronic combined systolic (congestive) and diastolic (congestive) heart failure: Secondary | ICD-10-CM | POA: Diagnosis not present

## 2016-09-17 DIAGNOSIS — Z8249 Family history of ischemic heart disease and other diseases of the circulatory system: Secondary | ICD-10-CM

## 2016-09-17 DIAGNOSIS — R000 Tachycardia, unspecified: Secondary | ICD-10-CM | POA: Diagnosis present

## 2016-09-17 DIAGNOSIS — E870 Hyperosmolality and hypernatremia: Secondary | ICD-10-CM | POA: Diagnosis not present

## 2016-09-17 DIAGNOSIS — Z720 Tobacco use: Secondary | ICD-10-CM | POA: Diagnosis present

## 2016-09-17 DIAGNOSIS — I420 Dilated cardiomyopathy: Secondary | ICD-10-CM | POA: Diagnosis present

## 2016-09-17 DIAGNOSIS — F0150 Vascular dementia without behavioral disturbance: Secondary | ICD-10-CM | POA: Diagnosis not present

## 2016-09-17 DIAGNOSIS — E872 Acidosis: Secondary | ICD-10-CM | POA: Diagnosis present

## 2016-09-17 DIAGNOSIS — Z87891 Personal history of nicotine dependence: Secondary | ICD-10-CM

## 2016-09-17 DIAGNOSIS — D72829 Elevated white blood cell count, unspecified: Secondary | ICD-10-CM | POA: Diagnosis present

## 2016-09-17 DIAGNOSIS — E875 Hyperkalemia: Secondary | ICD-10-CM | POA: Diagnosis present

## 2016-09-17 DIAGNOSIS — Z87440 Personal history of urinary (tract) infections: Secondary | ICD-10-CM

## 2016-09-17 DIAGNOSIS — R410 Disorientation, unspecified: Secondary | ICD-10-CM | POA: Diagnosis present

## 2016-09-17 DIAGNOSIS — I130 Hypertensive heart and chronic kidney disease with heart failure and stage 1 through stage 4 chronic kidney disease, or unspecified chronic kidney disease: Secondary | ICD-10-CM | POA: Diagnosis present

## 2016-09-17 DIAGNOSIS — I10 Essential (primary) hypertension: Secondary | ICD-10-CM | POA: Diagnosis present

## 2016-09-17 DIAGNOSIS — G9340 Encephalopathy, unspecified: Secondary | ICD-10-CM | POA: Diagnosis present

## 2016-09-17 DIAGNOSIS — T380X5A Adverse effect of glucocorticoids and synthetic analogues, initial encounter: Secondary | ICD-10-CM | POA: Diagnosis present

## 2016-09-17 DIAGNOSIS — M6281 Muscle weakness (generalized): Secondary | ICD-10-CM

## 2016-09-17 DIAGNOSIS — Z8673 Personal history of transient ischemic attack (TIA), and cerebral infarction without residual deficits: Secondary | ICD-10-CM

## 2016-09-17 DIAGNOSIS — J449 Chronic obstructive pulmonary disease, unspecified: Secondary | ICD-10-CM | POA: Diagnosis present

## 2016-09-17 LAB — I-STAT CHEM 8, ED
BUN: >140 mg/dL — ABNORMAL HIGH (ref 6–20)
Calcium, Ion: 1.29 mmol/L (ref 1.15–1.40)
Chloride: 113 mmol/L — ABNORMAL HIGH (ref 101–111)
Creatinine, Ser: 3.8 mg/dL — ABNORMAL HIGH (ref 0.44–1.00)
Glucose, Bld: 104 mg/dL — ABNORMAL HIGH (ref 65–99)
HCT: 41 % (ref 36.0–46.0)
Hemoglobin: 13.9 g/dL (ref 12.0–15.0)
Potassium: 6.9 mmol/L (ref 3.5–5.1)
Sodium: 138 mmol/L (ref 135–145)
TCO2: 14 mmol/L (ref 0–100)

## 2016-09-17 LAB — URINALYSIS, ROUTINE W REFLEX MICROSCOPIC
Bilirubin Urine: NEGATIVE
Glucose, UA: NEGATIVE mg/dL
Hgb urine dipstick: NEGATIVE
Ketones, ur: NEGATIVE mg/dL
Nitrite: NEGATIVE
Protein, ur: NEGATIVE mg/dL
Specific Gravity, Urine: 1.02 (ref 1.005–1.030)
pH: 5 (ref 5.0–8.0)

## 2016-09-17 LAB — CBC
HCT: 39.6 % (ref 36.0–46.0)
Hemoglobin: 13.3 g/dL (ref 12.0–15.0)
MCH: 32.6 pg (ref 26.0–34.0)
MCHC: 33.6 g/dL (ref 30.0–36.0)
MCV: 97.1 fL (ref 78.0–100.0)
Platelets: 206 10*3/uL (ref 150–400)
RBC: 4.08 MIL/uL (ref 3.87–5.11)
RDW: 13.8 % (ref 11.5–15.5)
WBC: 11.1 10*3/uL — ABNORMAL HIGH (ref 4.0–10.5)

## 2016-09-17 LAB — BASIC METABOLIC PANEL
Anion gap: 14 (ref 5–15)
BUN: 168 mg/dL — ABNORMAL HIGH (ref 6–20)
CO2: 18 mmol/L — ABNORMAL LOW (ref 22–32)
Calcium: 10.7 mg/dL — ABNORMAL HIGH (ref 8.9–10.3)
Chloride: 105 mmol/L (ref 101–111)
Creatinine, Ser: 3.84 mg/dL — ABNORMAL HIGH (ref 0.44–1.00)
GFR calc Af Amer: 12 mL/min — ABNORMAL LOW (ref >60)
GFR calc non Af Amer: 10 mL/min — ABNORMAL LOW (ref >60)
Glucose, Bld: 125 mg/dL — ABNORMAL HIGH (ref 65–99)
Potassium: 5.9 mmol/L — ABNORMAL HIGH (ref 3.5–5.1)
Sodium: 137 mmol/L (ref 135–145)

## 2016-09-17 LAB — LACTIC ACID, PLASMA
Lactic Acid, Venous: 1.9 mmol/L (ref 0.5–1.9)
Lactic Acid, Venous: 1.3 mmol/L (ref 0.5–1.9)

## 2016-09-17 LAB — URINALYSIS, MICROSCOPIC (REFLEX)

## 2016-09-17 LAB — I-STAT ARTERIAL BLOOD GAS, ED
Acid-base deficit: 15 mmol/L — ABNORMAL HIGH (ref 0.0–2.0)
Bicarbonate: 11.4 mmol/L — ABNORMAL LOW (ref 20.0–28.0)
O2 Saturation: 95 %
Patient temperature: 97.7
TCO2: 12 mmol/L (ref 0–100)
pCO2 arterial: 28.8 mmHg — ABNORMAL LOW (ref 32.0–48.0)
pH, Arterial: 7.204 — ABNORMAL LOW (ref 7.350–7.450)
pO2, Arterial: 85 mmHg (ref 83.0–108.0)

## 2016-09-17 LAB — I-STAT CG4 LACTIC ACID, ED: Lactic Acid, Venous: 1.75 mmol/L (ref 0.5–1.9)

## 2016-09-17 LAB — CBG MONITORING, ED: Glucose-Capillary: 73 mg/dL (ref 65–99)

## 2016-09-17 MED ORDER — ONDANSETRON HCL 4 MG PO TABS: 4.0000 mg | ORAL_TABLET | Freq: Four times a day (QID) | ORAL | Status: DC | PRN

## 2016-09-17 MED ORDER — SODIUM CHLORIDE 0.9 % IV BOLUS (SEPSIS)
1000.0000 mL | Freq: Once | INTRAVENOUS | Status: AC
  Administered 2016-09-17: 1000 mL via INTRAVENOUS

## 2016-09-17 MED ORDER — HYDROCODONE-ACETAMINOPHEN 5-325 MG PO TABS
1.0000 | ORAL_TABLET | ORAL | Status: DC | PRN
  Administered 2016-09-20: 1 via ORAL
  Filled 2016-09-17: qty 1

## 2016-09-17 MED ORDER — SENNOSIDES-DOCUSATE SODIUM 8.6-50 MG PO TABS: 1.0000 | ORAL_TABLET | Freq: Every evening | ORAL | Status: DC | PRN

## 2016-09-17 MED ORDER — SODIUM CHLORIDE 0.9 % IV SOLN
INTRAVENOUS | Status: DC
  Administered 2016-09-17: 19:00:00 via INTRAVENOUS
  Administered 2016-09-17: 100 mL/h via INTRAVENOUS
  Administered 2016-09-18 – 2016-09-19 (×3): via INTRAVENOUS

## 2016-09-17 MED ORDER — LORAZEPAM 2 MG/ML IJ SOLN
INTRAMUSCULAR | Status: AC
  Administered 2016-09-17: 1 mg
  Filled 2016-09-17: qty 1

## 2016-09-17 MED ORDER — BISACODYL 10 MG RE SUPP: 10.0000 mg | Freq: Every day | RECTAL | Status: DC | PRN

## 2016-09-17 MED ORDER — LORAZEPAM 0.5 MG PO TABS
0.5000 mg | ORAL_TABLET | Freq: Two times a day (BID) | ORAL | Status: DC | PRN
  Administered 2016-09-17 – 2016-09-19 (×2): 0.5 mg via ORAL
  Administered 2016-09-20: 1 mg via ORAL
  Filled 2016-09-17: qty 2
  Filled 2016-09-17 (×2): qty 1

## 2016-09-17 MED ORDER — SODIUM BICARBONATE 8.4 % IV SOLN
50.0000 meq | Freq: Once | INTRAVENOUS | Status: AC
  Administered 2016-09-17: 50 meq via INTRAVENOUS
  Filled 2016-09-17: qty 50

## 2016-09-17 MED ORDER — ONDANSETRON HCL 4 MG/2ML IJ SOLN: 4.0000 mg | Freq: Four times a day (QID) | INTRAMUSCULAR | Status: DC | PRN

## 2016-09-17 MED ORDER — ACETAMINOPHEN 325 MG PO TABS
650.0000 mg | ORAL_TABLET | Freq: Four times a day (QID) | ORAL | Status: DC | PRN
  Administered 2016-09-19: 650 mg via ORAL
  Filled 2016-09-17: qty 2

## 2016-09-17 MED ORDER — SODIUM CHLORIDE 0.9 % IV SOLN
1.0000 g | Freq: Once | INTRAVENOUS | Status: AC
  Administered 2016-09-17: 1 g via INTRAVENOUS
  Filled 2016-09-17: qty 10

## 2016-09-17 MED ORDER — ACETAMINOPHEN 650 MG RE SUPP: 650.0000 mg | Freq: Four times a day (QID) | RECTAL | Status: DC | PRN

## 2016-09-17 MED ORDER — LORAZEPAM 2 MG/ML IJ SOLN
0.5000 mg | Freq: Once | INTRAMUSCULAR | Status: AC
  Administered 2016-09-17: 0.5 mg via INTRAVENOUS
  Filled 2016-09-17: qty 1

## 2016-09-17 MED ORDER — MAGNESIUM CITRATE PO SOLN: 1.0000 | Freq: Once | ORAL | Status: DC | PRN

## 2016-09-17 MED ORDER — SODIUM POLYSTYRENE SULFONATE 15 GM/60ML PO SUSP
15.0000 g | Freq: Once | ORAL | Status: DC
  Filled 2016-09-17: qty 60

## 2016-09-17 MED ORDER — HEPARIN SODIUM (PORCINE) 5000 UNIT/ML IJ SOLN
5000.0000 [IU] | Freq: Three times a day (TID) | INTRAMUSCULAR | Status: DC
Start: 2016-09-17 — End: 2016-09-22
  Administered 2016-09-17 – 2016-09-21 (×13): 5000 [IU] via SUBCUTANEOUS
  Filled 2016-09-17 (×14): qty 1

## 2016-09-17 MED ORDER — DEXTROSE 5 % IV SOLN
1.0000 g | INTRAVENOUS | Status: DC
  Administered 2016-09-17 – 2016-09-19 (×3): 1 g via INTRAVENOUS
  Filled 2016-09-17 (×4): qty 10

## 2016-09-17 NOTE — ED Notes (Signed)
IV attempted x2. Pt uncooperative and pulls out iv. MD aware.

## 2016-09-17 NOTE — ED Notes (Signed)
ED Provider at bedside. 

## 2016-09-17 NOTE — ED Notes (Signed)
IV confirmed with blood return.

## 2016-09-17 NOTE — ED Triage Notes (Signed)
Per ems- Pt comes from home where she lives with her family. She has history of dementia and ambulates with a walker. Over last 2 days family is concerned for generalized weakness and forgetfulness. Has hx of UTI, unsure if that is an issue today. Initial CBG 50, gave oral glucose, repeat 137. Unable to get IV access. BP initially 82 systolic, repeat 161114. Pt's family at bedside.

## 2016-09-17 NOTE — ED Notes (Signed)
[facility] Patria Maneampos with US.

## 2016-09-17 NOTE — ED Notes (Signed)
Pt keeps removing pulse ox from finger, unable to get a reading on her toe. Per Dr. Patria Maneampos, pulse ox was removed and pt remains on cardiac monitor.

## 2016-09-17 NOTE — ED Provider Notes (Signed)
[facility]-EMERGENCY DEPT Provider Note   CSN: [id number] Arrival date & time: 09/17/16  1009  History   Chief Complaint Chief Complaint  Patient presents with  . Altered Mental Status    HPI Tammy [name] is a 75 y.o. female.  HPI  75 y.o. female with a hx of HTN, Dementia, presents to the Emergency Department today due to increased weakness, fatigue according to family. Notes odor in urine. Able to tolerate PO. Activity unchanged. Pt does not endorse pain currently, but pt has dementia. No N/V. No diarrhea. No CP/SOB/ABD pain. Has hx of frequent UTIs. Does endorse No other symptoms noted.   Level V Caveat: Dementia   Past Medical History:  Diagnosis Date  . Hypertension   . Insomnia     Patient Active Problem List   Diagnosis Date Noted  . Rhabdomyolysis 07/02/2016  . Fall at home 07/01/2016  . Lactic acid acidosis 12/01/2015  . Metabolic acidosis 12/01/2015  . Protein-calorie malnutrition, severe (HCC) 12/01/2015  . AKI (acute kidney injury) (HCC)   . Hyponatremia   . Hypothermia   . Tachycardia   . Hyperkalemia 11/29/2015  . Acute renal failure (HCC) 11/29/2015  . Chronic combined systolic and diastolic CHF, 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/2015  . Mitral regurgitation 11/29/2015  . Elevated lactic acid level 11/29/2015  . Agitation 11/29/2015  . COPD (chronic obstructive pulmonary disease) (HCC) 11/29/2015  . Dementia 11/29/2015  . Confusion   . Acute blood loss anemia 08/21/2015  . Postoperative anemia due to acute blood loss 08/20/2015  . Displaced intertrochanteric fracture of left femur (HCC) 08/19/2015  . Closed left hip fracture (HCC) 08/18/2015  . Secondary cardiomyopathy (HCC)   . Acute systolic congestive heart failure, NYHA class 3 (HCC) 03/02/2015  . COPD exacerbation (HCC) 03/02/2015  . Tobacco abuse 03/02/2015  . Severe protein-calorie malnutrition (HCC) 03/02/2015  . HTN (hypertension) 03/02/2015  . Congestive dilated cardiomyopathy (HCC)   . CVA  (cerebral infarction) 02/28/2015  . Stroke [facility](HCC)     Past Surgical History:  Procedure Laterality Date  . INTRAMEDULLARY (IM) NAIL INTERTROCHANTERIC Left 08/19/2015   Procedure: INTRAMEDULLARY (IM) NAIL INTERTROCHANTRIC;  Surgeon: Jodi GeraldsJohn Graves, MD;  Location: [facility] OR;  Service: Orthopedics;  Laterality: Left;    OB History    No data available       Home Medications    Prior to Admission medications   Medication Sig Start Date End Date Taking? Authorizing Provider  aspirin 81 MG chewable tablet Chew 1 tablet (81 mg total) by mouth daily. Patient not taking: Reported on 07/01/2016 12/01/15   Theda BelfastNishant Dhungel, MD  carvedilol (COREG) 3.125 MG tablet Take 1 tablet (3.125 mg total) by mouth 2 (two) times daily with a meal. Patient not taking: Reported on 07/01/2016 03/04/15   Onalee Huaavid L Rinehuls, PA-C  feeding supplement, ENSURE ENLIVE, (ENSURE ENLIVE) LIQD Take 237 mLs by mouth 3 (three) times daily between meals. Patient not taking: Reported on 07/01/2016 12/01/15   Nishant Dhungel, MD  senna-docusate (SENOKOT-S) 8.6-50 MG tablet Take 2 tablets by mouth at bedtime as needed for moderate constipation. Patient not taking: Reported on 07/01/2016 12/01/15   Eddie NorthNishant Dhungel, MD    Family History Family History  Problem Relation Age of Onset  . Congestive Heart Failure Son 555    Social History Social History  Substance Use Topics  . Smoking status: Current Every Day Smoker    Packs/day: 2.00    Years: 50.00    Types: Cigarettes  . Smokeless tobacco: Never  Used  . Alcohol use No     Allergies   Patient has no known allergies.   Review of Systems Review of Systems  Unable to perform ROS: Dementia   Physical Exam Updated Vital Signs BP (!) 98/44 (BP Location: Right Arm)   Pulse 98   Resp 18   Physical Exam  Constitutional: Vital signs are normal. She appears well-developed and well-nourished.  HENT:  Head: Normocephalic.  Right Ear: Hearing normal.  Left Ear: Hearing normal.   Eyes: Conjunctivae and EOM are normal. Pupils are equal, round, and reactive to light.  Neck: Normal range of motion. Neck supple.  Cardiovascular: Regular rhythm, normal heart sounds and intact distal pulses.  Tachycardia present.   Pulmonary/Chest: Effort normal and breath sounds normal.  Abdominal: Soft. Bowel sounds are normal. There is no tenderness.  Musculoskeletal: Normal range of motion.  Neurological: She is alert. She has normal strength. She is disoriented. No sensory deficit.  Equal Grip Strengths Bilateral Upper/Lower Extremities No Facial Droop  Skin: Skin is warm and dry.  Psychiatric: She has a normal mood and affect. Her speech is normal and behavior is normal. Thought content normal.  Nursing note and vitals reviewed.  ED Treatments / Results  Labs (all labs ordered are listed, but only abnormal results are displayed) Labs Reviewed  CBC - Abnormal; Notable for the following:       Result Value   WBC 11.1 (*)    All other components within normal limits  BASIC METABOLIC PANEL - Abnormal; Notable for the following:    Potassium 5.9 (*)    CO2 18 (*)    Glucose, Bld 125 (*)    BUN 168 (*)    Creatinine, Ser 3.84 (*)    Calcium 10.7 (*)    GFR calc non Af Amer 10 (*)    GFR calc Af Amer 12 (*)    All other components within normal limits  URINALYSIS, ROUTINE W REFLEX MICROSCOPIC - Abnormal; Notable for the following:    Leukocytes, UA SMALL (*)    All other components within normal limits  URINALYSIS, MICROSCOPIC (REFLEX) - Abnormal; Notable for the following:    Bacteria, UA FEW (*)    Squamous Epithelial / LPF 6-30 (*)    All other components within normal limits  I-STAT CHEM 8, ED - Abnormal; Notable for the following:    Potassium 6.9 (*)    Chloride 113 (*)    BUN >140 (*)    Creatinine, Ser 3.80 (*)    Glucose, Bld 104 (*)    All other components within normal limits  CULTURE, BLOOD (ROUTINE X 2)  URINE CULTURE  CULTURE, BLOOD (ROUTINE X 2)  CBG  MONITORING, ED  I-STAT CG4 LACTIC ACID, ED    EKG  EKG Interpretation  Date/Time:  Wednesday September 17 2016 10:17:05 EST Ventricular Rate:  100 PR Interval:    QRS Duration: 83 QT Interval:  310 QTC Calculation: 400 R Axis:   72 Text Interpretation:  Sinus tachycardia Probable LVH with secondary repol abnrm No significant change was found Confirmed by CAMPOS  MD, Caryn Bee ([id number]) on 09/17/2016 12:45:32 PM       Radiology Dg Chest 2 View  Result Date: 09/17/2016 CLINICAL DATA:  Cough, weakness, forgetfulness, smoking history EXAM: CHEST  2 VIEW COMPARISON:  Chest x-ray of 07/01/2016 FINDINGS: No active infiltrate or effusion is seen. The lungs remain somewhat hyperaerated. Mediastinal and hilar contours are unremarkable. The heart is within normal limits in  size. There are degenerative changes throughout the thoracic spine and shoulders. IMPRESSION: Hyperaeration.  No active lung disease. Electronically Signed   By: Dwyane DeePaul  Barry M.D.   On: 09/17/2016 11:48    Procedures Procedures (including critical care time)  Medications Ordered in ED Medications  calcium gluconate 1 g in sodium chloride 0.9 % 100 mL IVPB (not administered)  sodium polystyrene (KAYEXALATE) 15 GM/60ML suspension 15 g (not administered)  sodium chloride 0.9 % bolus 1,000 mL (0 mLs Intravenous Stopped 09/17/16 1313)  LORazepam (ATIVAN) injection 0.5 mg (0.5 mg Intravenous Given 09/17/16 1313)  sodium chloride 0.9 % bolus 1,000 mL (1,000 mLs Intravenous New Bag/Given 09/17/16 1313)     Initial Impression / Assessment and Plan / ED Course  I have reviewed the triage vital signs and the nursing notes.  Pertinent labs & imaging results that were available during my care of the patient were reviewed by me and considered in my medical decision making (see chart for details).  Clinical Course    Final Clinical Impressions(s) / ED Diagnoses  {I have reviewed and evaluated the relevant laboratory values. {I have  reviewed and evaluated the relevant imaging studies.  {I have reviewed the relevant previous healthcare records.  {I obtained HPI from historian.   ED Course:  Assessment: Pt is a 75yF with hx HTN, Dementia who presents with AMS. Hx UTI. On exam, pt in NAD. Nontoxic/nonseptic appearing. VS with tachycardia and hypotension. Afebrile. Lungs CTA. Heart RRR. Abdomen nontender soft. CBC with leukocytosis 11.1. BMP with AKI with creatinine 3.84. UA unremarkable. CXR without infiltrate. Given 2L NS bolus in ED as well as 1g calcium gluconate due to hyperkalemia of 5.9 as well as kayexylate. Plan is to Admit to medicine for AKI likely due to dehydration.   2:10 PM- BP improved in ED with fluids to 130s systolic. Tachycardia resolving to 103 bpm currently 2:56 PM- ABG with pH 7.2. CO2 28. Bicarb 11. O2 95. Given 1 amp Bicarb. Given Rocephin per medicine request  Disposition/Plan:  Admit Pt acknowledges and agrees with plan  Supervising Physician Azalia BilisKevin Campos, MD  Final diagnoses:  AKI (acute kidney injury) [facility](HCC)  Hyperkalemia    New Prescriptions New Prescriptions   No medications on file      Audry Piliyler Kire Ferg, PA-C 09/17/16 1457    Azalia BilisKevin Campos, MD 09/17/16 [phone]

## 2016-09-17 NOTE — H&P (Signed)
History and Physical    [name] MWU:[id number] DOB: [birth year] DOA: 09/17/2016   PCP: Cala Bradford, MD   Patient coming from:  Home   Chief Complaint: confusion   HPI: [name] is a 75 y.o. female with medical history significant for hypertension, dementia, heavy tobacco history, and   history of frequent UTIs  presenting tot he ED with acute confusional state. According to EDP, she was weak, fatiguedSHe has chronic  Incontinence but family denied changes in her urine odor. Per chart, she had been seen with fatigue on 12/2 but she was not required admission and received IV F, oral glucose for CBG of 50, and sent home in stable condition. On this visit, patient is visibly confused,  unable to engage in conversation . Per EDP notes, no apparent dyspnea, or chest pain. No nausea, vomiting or diarrhea. She is able to tolerate oral intake without dysphagia . No changes in her meds, or new meds. No new sick contacts.    ED Course:  BP (!) 121/48   Pulse 99   Temp 97.7 F (36.5 C)   Resp 19   SpO2 (!) 67%    white count 11.1 bicarb 18 potassium 5.9 creatinine 3.84 leukocytes small in urine with few bacteria ABG pending received Kayaxelate , 2 L of IV fluids, and calcium gluconate. Rocephin given at the ED  Lactic acid 1.75   Review of Systems: unable to perform due to dementia. She is aware of self, that she is unable to know place  date or year  Past Medical History:  Diagnosis Date  . Hypertension   . Insomnia     Past Surgical History:  Procedure Laterality Date  . INTRAMEDULLARY (IM) NAIL INTERTROCHANTERIC Left 08/19/2015   Procedure: INTRAMEDULLARY (IM) NAIL INTERTROCHANTRIC;  Surgeon: Jodi Geralds, MD;  Location: [facility] OR;  Service: Orthopedics;  Laterality: Left;    Social History Social History   Social History  . Marital status: Single    Spouse name: N/A  . Number of children: N/A  . Years of education: N/A   Occupational History  . retired     Social History Main Topics  . Smoking status: Current Every Day Smoker    Packs/day: 2.00    Years: 50.00    Types: Cigarettes  . Smokeless tobacco: Never Used  . Alcohol use No  . Drug use: No  . Sexual activity: Not on file   Other Topics Concern  . Not on file   Social History Narrative  . No narrative on file     No Known Allergies  Family History  Problem Relation Age of Onset  . Congestive Heart Failure Son 55      Prior to Admission medications   Medication Sig Start Date End Date Taking? Authorizing Provider  feeding supplement, ENSURE ENLIVE, (ENSURE ENLIVE) LIQD Take 237 mLs by mouth 3 (three) times daily between meals. 12/01/15  Yes Nishant Dhungel, MD  aspirin 81 MG chewable tablet Chew 1 tablet (81 mg total) by mouth daily. Patient not taking: Reported on 07/01/2016 12/01/15   Theda Belfast Dhungel, MD  carvedilol (COREG) 3.125 MG tablet Take 1 tablet (3.125 mg total) by mouth 2 (two) times daily with a meal. Patient not taking: Reported on 07/01/2016 03/04/15   Onalee Hua L Rinehuls, PA-C  senna-docusate (SENOKOT-S) 8.6-50 MG tablet Take 2 tablets by mouth at bedtime as needed for moderate constipation. Patient not taking: Reported on 07/01/2016 12/01/15   Eddie North, MD  Physical Exam:    Vitals:   09/17/16 1441 09/17/16 1445 09/17/16 1446 09/17/16 1501  BP:  (!) 123/45 (!) 123/45 (!) 121/48  Pulse:      Resp: 18  15 19   Temp: 97.7 F (36.5 C)     SpO2:           Constitutional: NAD, calm, comfortable  Vitals:   09/17/16 1441 09/17/16 1445 09/17/16 1446 09/17/16 1501  BP:  (!) 123/45 (!) 123/45 (!) 121/48  Pulse:      Resp: 18  15 19   Temp: 97.7 F (36.5 C)     SpO2:       Eyes: PERRL, lids and conjunctivae normal ENMT: Mucous membranes are dry Posterior pharynx clear of any exudate or lesions edentulous  Neck: normal, supple, no masses, no thyromegaly Respiratory: clear to auscultation bilaterally, no wheezing, no crackles. Normal  respiratory effort. No accessory muscle use.  Cardiovascular: regular  rate and rhythm, no murmurs / rubs / gallops. No extremity edema. 2+ pedal pulses. No carotid bruits.  Abdomen: no tenderness, no masses palpated. No hepatosplenomegaly. Bowel sounds positive.  Musculoskeletal: no clubbing / cyanosis. No joint deformity upper and lower extremities. Good ROM, no contractures. Normal muscle tone.  Skin: no rashes, lesions, ulcers.  Neurologic: unable to follow commands, but is responsive to voice, touch, she does appear to have equal strength and sensation  Labs on Admission: I have personally reviewed following labs and imaging studies  CBC:  Recent Labs Lab 09/17/16 1114 09/17/16 1208  WBC  --  11.1*  HGB 13.9 13.3  HCT 41.0 39.6  MCV  --  97.1  PLT  --  206    Basic Metabolic Panel:  Recent Labs Lab 09/17/16 1114 09/17/16 1208  NA 138 137  K 6.9* 5.9*  CL 113* 105  CO2  --  18*  GLUCOSE 104* 125*  BUN >140* 168*  CREATININE 3.80* 3.84*  CALCIUM  --  10.7*    GFR: CrCl cannot be calculated (Unknown ideal weight.).  Liver Function Tests: No results for input(s): AST, ALT, ALKPHOS, BILITOT, PROT, ALBUMIN in the last 168 hours. No results for input(s): LIPASE, AMYLASE in the last 168 hours. No results for input(s): AMMONIA in the last 168 hours.  Coagulation Profile: No results for input(s): INR, PROTIME in the last 168 hours.  Cardiac Enzymes: No results for input(s): CKTOTAL, CKMB, CKMBINDEX, TROPONINI in the last 168 hours.  BNP (last 3 results) No results for input(s): PROBNP in the last 8760 hours.  HbA1C: No results for input(s): HGBA1C in the last 72 hours.  CBG:  Recent Labs Lab 09/17/16 1025  GLUCAP 73    Lipid Profile: No results for input(s): CHOL, HDL, LDLCALC, TRIG, CHOLHDL, LDLDIRECT in the last 72 hours.  Thyroid Function Tests: No results for input(s): TSH, T4TOTAL, FREET4, T3FREE, THYROIDAB in the last 72 hours.  Anemia  Panel: No results for input(s): VITAMINB12, FOLATE, FERRITIN, TIBC, IRON, RETICCTPCT in the last 72 hours.  Urine analysis:    Component Value Date/Time   COLORURINE YELLOW 09/17/2016 1311   APPEARANCEUR CLEAR 09/17/2016 1311   LABSPEC 1.020 09/17/2016 1311   PHURINE 5.0 09/17/2016 1311   GLUCOSEU NEGATIVE 09/17/2016 1311   HGBUR NEGATIVE 09/17/2016 1311   BILIRUBINUR NEGATIVE 09/17/2016 1311   KETONESUR NEGATIVE 09/17/2016 1311   PROTEINUR NEGATIVE 09/17/2016 1311   UROBILINOGEN 0.2 08/19/2015 0756   NITRITE NEGATIVE 09/17/2016 1311   LEUKOCYTESUR SMALL (A) 09/17/2016 1311    Sepsis Labs: @  LABRCNTIP(procalcitonin:4,lacticidven:4) ) Recent Results (from the past 240 hour(s))  Blood culture (routine x 2)     Status: None (Preliminary result)   Collection Time: 09/17/16 12:17 PM  Result Value Ref Range Status   Specimen Description BLOOD RIGHT ANTECUBITAL  Final   Special Requests BOTTLES DRAWN AEROBIC AND ANAEROBIC  3CC  Final   Culture PENDING  Incomplete   Report Status PENDING  Incomplete     Radiological Exams on Admission: Dg Chest 2 View  Result Date: 09/17/2016 CLINICAL DATA:  Cough, weakness, forgetfulness, smoking history EXAM: CHEST  2 VIEW COMPARISON:  Chest x-ray of 07/01/2016 FINDINGS: No active infiltrate or effusion is seen. The lungs remain somewhat hyperaerated. Mediastinal and hilar contours are unremarkable. The heart is within normal limits in size. There are degenerative changes throughout the thoracic spine and shoulders. IMPRESSION: Hyperaeration.  No active lung disease. Electronically Signed   By: Dwyane DeePaul  Barry M.D.   On: 09/17/2016 11:48    EKG: Independently reviewed.  Assessment/Plan Active Problems:   Hyperkalemia   Confusion   Stroke (HCC)   Tobacco abuse   HTN (hypertension)   Congestive dilated cardiomyopathy (HCC)   Acute renal failure (HCC)   Chronic combined systolic and diastolic CHF, NYHA class 2 (HCC)   COPD (chronic obstructive  pulmonary disease) (HCC)   Dementia   AKI (acute kidney injury) (HCC)   Acute confusional state   Acute encephalopathy in a patient with dementia, not on chronic meds. THis is likely due to recurrent UTI  . History of dementia . On admission she was tachycardic and tachypneic, resolved after 2 L IVF. Afebrile UA with some .white count 11.1 bicarb 18 leukocytes small in urine with few bacteria. ABG pending. Lactic acid 1.75   Received 2 L of IV fluids, Rocephin given at the ED  Admit to telemetry obs  Ammonia level  IVF at 100 cc/h  Await blood and Urine cultures for further recommendations  Repeat CBC in am  Lactic acid   Acute hyperkalemia likely in the setting of volume depletion , 5.9  Hyperkalemia protocol has been followed in the ER, she has been given IV insulin-D50-bicarbonate-Kayexalate  - Repeat EKG as indicated Repeat K and EKG tonight    Acute on Chronic kidney disease, in the setting of dhydration   Stage 3  Cr 3.84, baseline 1  Lab Results  Component Value Date   CREATININE 3.84 (H) 09/17/2016   CREATININE 3.80 (H) 09/17/2016   CREATININE 1.00 07/03/2016   IVF Repeat CMET in am   Hypertension BP  121/48   Pulse 99 Controlled . HAs not been on antihypertesives since 09/2015  Will continue to monitor    Chronic  Combined systolic diastolic CHF/ Congestive DCM/ MR , last 2 D echo 03/2015, EF 40-45, mod reduced syst function and Gr2 DD . Patient is no longer on Coreg since 09/2016  MOnitor I/O and weight, currently 54 kg, appears compensated    COPD, hypoxic on presentation , on O2 at this time to maintain normal sats. Patient does not show evidence of exacerbation.  CXR hyperaeration without mass  WBC 11.1 . Heavy tob abuse, 2 ppd  Continue  O2    Deconditioning OT/PT   DVT prophylaxis:  Heparin  Code Status:   Full      Family Communication:  None  Disposition Plan: Expect patient to be discharged to home after condition improves Consults called:     None Admission status:Tele  Obs    Turning Point HospitalWERTMAN,Felma Pfefferle  E, PA-C [facility]   09/17/2016, 3:44 PM

## 2016-09-18 DIAGNOSIS — F0150 Vascular dementia without behavioral disturbance: Secondary | ICD-10-CM | POA: Diagnosis not present

## 2016-09-18 DIAGNOSIS — F05 Delirium due to known physiological condition: Secondary | ICD-10-CM

## 2016-09-18 DIAGNOSIS — E790 Hyperuricemia without signs of inflammatory arthritis and tophaceous disease: Secondary | ICD-10-CM

## 2016-09-18 DIAGNOSIS — N390 Urinary tract infection, site not specified: Secondary | ICD-10-CM | POA: Diagnosis not present

## 2016-09-18 DIAGNOSIS — N179 Acute kidney failure, unspecified: Secondary | ICD-10-CM | POA: Diagnosis not present

## 2016-09-18 DIAGNOSIS — R8290 Unspecified abnormal findings in urine: Secondary | ICD-10-CM | POA: Diagnosis not present

## 2016-09-18 DIAGNOSIS — I10 Essential (primary) hypertension: Secondary | ICD-10-CM | POA: Diagnosis not present

## 2016-09-18 DIAGNOSIS — Z8659 Personal history of other mental and behavioral disorders: Secondary | ICD-10-CM | POA: Diagnosis not present

## 2016-09-18 DIAGNOSIS — G9340 Encephalopathy, unspecified: Secondary | ICD-10-CM | POA: Diagnosis not present

## 2016-09-18 DIAGNOSIS — I5042 Chronic combined systolic (congestive) and diastolic (congestive) heart failure: Secondary | ICD-10-CM | POA: Diagnosis not present

## 2016-09-18 LAB — URINE CULTURE

## 2016-09-18 LAB — CBC
HCT: 30.7 % — ABNORMAL LOW (ref 36.0–46.0)
Hemoglobin: 10.4 g/dL — ABNORMAL LOW (ref 12.0–15.0)
MCH: 32.6 pg (ref 26.0–34.0)
MCHC: 33.9 g/dL (ref 30.0–36.0)
MCV: 96.2 fL (ref 78.0–100.0)
Platelets: 177 10*3/uL (ref 150–400)
RBC: 3.19 MIL/uL — ABNORMAL LOW (ref 3.87–5.11)
RDW: 13.5 % (ref 11.5–15.5)
WBC: 12.8 10*3/uL — ABNORMAL HIGH (ref 4.0–10.5)

## 2016-09-18 LAB — COMPREHENSIVE METABOLIC PANEL
ALT: 22 U/L (ref 14–54)
AST: 34 U/L (ref 15–41)
Albumin: 3.4 g/dL — ABNORMAL LOW (ref 3.5–5.0)
Alkaline Phosphatase: 35 U/L — ABNORMAL LOW (ref 38–126)
Anion gap: 11 (ref 5–15)
BUN: 109 mg/dL — ABNORMAL HIGH (ref 6–20)
CO2: 15 mmol/L — ABNORMAL LOW (ref 22–32)
Calcium: 9.1 mg/dL (ref 8.9–10.3)
Chloride: 117 mmol/L — ABNORMAL HIGH (ref 101–111)
Creatinine, Ser: 1.65 mg/dL — ABNORMAL HIGH (ref 0.44–1.00)
GFR calc Af Amer: 34 mL/min — ABNORMAL LOW (ref >60)
GFR calc non Af Amer: 29 mL/min — ABNORMAL LOW (ref >60)
Glucose, Bld: 94 mg/dL (ref 65–99)
Potassium: 4.6 mmol/L (ref 3.5–5.1)
Sodium: 143 mmol/L (ref 135–145)
Total Bilirubin: 0.6 mg/dL (ref 0.3–1.2)
Total Protein: 6.3 g/dL — ABNORMAL LOW (ref 6.5–8.1)

## 2016-09-18 LAB — URIC ACID: Uric Acid, Serum: 9.1 mg/dL — ABNORMAL HIGH (ref 2.3–6.6)

## 2016-09-18 MED ORDER — PREDNISONE 20 MG PO TABS
20.0000 mg | ORAL_TABLET | Freq: Two times a day (BID) | ORAL | Status: DC
  Administered 2016-09-18 – 2016-09-19 (×3): 20 mg via ORAL
  Filled 2016-09-18 (×3): qty 1

## 2016-09-18 NOTE — Progress Notes (Addendum)
[facility] PROGRESS NOTE  Lamiyah T Salvia WUJ:[id number]:[id number] [id number] DOB: May 28, 1941 DOA: 09/17/2016  PCP: Cala BradfordWHITE,CYNTHIA S, MD  Brief History/Interval Summary: 75 year old African-American female with a past medical history of hypertension, dementia, history of frequent UTIs, presented to the emergency department in acute confusional state. Apparently she was brought in by family. Patient was found to have abnormal UA. She was found to be in renal failure. She was hospitalized for management.  Reason for Visit: UTI and acute renal failure  Consultants: None  Procedures: None  Antibiotics: Ceftriaxone  Subjective/Interval History: Patient mildly distracted this morning. She states however that she is feeling better. She denies any nausea, vomiting.  ROS: Denies abdominal pain. Denies any chest pain or shortness of breath.  Objective:  Vital Signs  Vitals:   09/17/16 1642 09/17/16 2106 09/18/16 0335 09/18/16 0520  BP: (!) 123/93 (!) 137/109  (!) 139/50  Pulse: (!) 116 (!) 59  (!) 121  Resp: 18 18  18   Temp: 97.8 F (36.6 C)   98.3 F (36.8 C)  TempSrc: Oral   Oral  SpO2: (!) 82% 90%  98%  Weight:   53.9 kg (118 lb 14.4 oz)     Intake/Output Summary (Last 24 hours) at 09/18/16 1428 Last data filed at 09/18/16 0500  Gross per 24 hour  Intake             1475 ml  Output                0 ml  Net             1475 ml   Filed Weights   09/18/16 0335  Weight: 53.9 kg (118 lb 14.4 oz)    General appearance: alert, cooperative, distracted and no distress Resp: clear to auscultation bilaterally Cardio: regular rate and rhythm, S1, S2 normal, no murmur, click, rub or gallop GI: soft, non-tender; bowel sounds normal; no masses,  no organomegaly Extremities: Mild erythema noted over the left thumb area. She also is noted to jump up in pain whenever her feet or legs were examined. No obvious lesions noted. Neurologic: Awake, alert. No facial asymmetry. Tongue is midline.  Oriented to place, city, year. Not sure about the month. Did not know the president. Moving all her extremities. No focal deficits.  Lab Results:  Data Reviewed: I have personally reviewed following labs and imaging studies  CBC:  Recent Labs Lab 09/17/16 1114 09/17/16 1208 09/18/16 0415  WBC  --  11.1* 12.8*  HGB 13.9 13.3 10.4*  HCT 41.0 39.6 30.7*  MCV  --  97.1 96.2  PLT  --  206 177    Basic Metabolic Panel:  Recent Labs Lab 09/17/16 1114 09/17/16 1208 09/18/16 0415  NA 138 137 143  K 6.9* 5.9* 4.6  CL 113* 105 117*  CO2  --  18* 15*  GLUCOSE 104* 125* 94  BUN >140* 168* 109*  CREATININE 3.80* 3.84* 1.65*  CALCIUM  --  10.7* 9.1    GFR: Estimated Creatinine Clearance: 24.7 mL/min (by C-G formula based on SCr of 1.65 mg/dL (H)).  Liver Function Tests:  Recent Labs Lab 09/18/16 0415  AST 34  ALT 22  ALKPHOS 35*  BILITOT 0.6  PROT 6.3*  ALBUMIN 3.4*    CBG:  Recent Labs Lab 09/17/16 1025  GLUCAP 73     Recent Results (from the past 240 hour(s))  Blood culture (routine x 2)     Status: None (Preliminary result)   Collection  Time: 09/17/16 12:17 PM  Result Value Ref Range Status   Specimen Description BLOOD RIGHT ANTECUBITAL  Final   Special Requests BOTTLES DRAWN AEROBIC AND ANAEROBIC  3CC  Final   Culture PENDING  Incomplete   Report Status PENDING  Incomplete  Urine culture     Status: Abnormal   Collection Time: 09/17/16  1:11 PM  Result Value Ref Range Status   Specimen Description URINE, CLEAN CATCH  Final   Special Requests NONE  Final   Culture MULTIPLE SPECIES PRESENT, SUGGEST RECOLLECTION (A)  Final   Report Status 09/18/2016 FINAL  Final      Radiology Studies: Dg Chest 2 View  Result Date: 09/17/2016 CLINICAL DATA:  Cough, weakness, forgetfulness, smoking history EXAM: CHEST  2 VIEW COMPARISON:  Chest x-ray of 07/01/2016 FINDINGS: No active infiltrate or effusion is seen. The lungs remain somewhat hyperaerated.  Mediastinal and hilar contours are unremarkable. The heart is within normal limits in size. There are degenerative changes throughout the thoracic spine and shoulders. IMPRESSION: Hyperaeration.  No active lung disease. Electronically Signed   By: Dwyane DeePaul  Barry M.D.   On: 09/17/2016 11:48     Medications:  Scheduled: . cefTRIAXone (ROCEPHIN)  IV  1 g Intravenous Q24H  . heparin  5,000 Units Subcutaneous Q8H  . sodium polystyrene  15 g Oral Once   Continuous: . sodium chloride 50 mL/hr at 09/18/16 1027   ZOX:[id number]:acetaminophen **OR** acetaminophen, bisacodyl, HYDROcodone-acetaminophen, LORazepam, magnesium citrate, ondansetron **OR** ondansetron (ZOFRAN) IV, senna-docusate  Assessment/Plan:  Active Problems:   Stroke (HCC)   Tobacco abuse   HTN (hypertension)   Congestive dilated cardiomyopathy (HCC)   Hyperkalemia   Acute renal failure (HCC)   Chronic combined systolic and diastolic CHF, NYHA class 2 (HCC)   COPD (chronic obstructive pulmonary disease) (HCC)   Dementia   Confusion   AKI (acute kidney injury) (HCC)   Acute confusional state   Complicated UTI (urinary tract infection)    Acute encephalopathy This is in the setting of a patient with she had a period, possibly due to UTI. Patient was also dehydrated and in acute renal failure, which could have also contributed. Mental status appears to have improved today. Continue to monitor closely.   Acute failure with hyperkalemia with Non AG Metabolic acidosis Renal function is improving. Potassium has improved. We'll monitor urine output. Cut back on IV fluids.   Essential Hypertension  Has not been on antihypertesives since 09/2015. Blood pressure is reasonably well controlled. Continue to monitor.  Chronic Combined systolic diastolic CHF/ Congestive DCM/ MR  Last 2 D echo 03/2015, EF 40-45, mod reduced syst function and Gr2 DD. Patient is no longer on Coreg since 09/2016. Monitor I/O and weight. Caution with IV fluids.    COPD, hypoxic on presentation Continue oxygen. Chest x-ray did not show any infiltrates. Lungs are clear to auscultation bilaterally today.  Deconditioning OT/PT   Elevated uric acid Possible gout. Could be the reason for her pain in the lower extremities. Cannot use NSAIDs due to renal failure. Could try a short course of steroids.  Abnormal UA. Cultures are not showing any specific organism. Continue ceftriaxone for now.   DVT Prophylaxis: SQ Heparin    Code Status: Full Code  Family Communication: No family at bedside  Disposition Plan: Management as above    LOS: 0 days   [facility] HospitalKRISHNAN,Lateef Juncaj  [facility] Pager [phone] 09/18/2016, 2:28 PM  If 7PM-7AM, please contact night-coverage at [email or web address], password [email or web address]

## 2016-09-18 NOTE — Care Management Obs Status (Signed)
MEDICARE OBSERVATION STATUS NOTIFICATION   Patient Details  Name: Tammy [name] MRN: [id number] Date of Birth: [birth year]   Medicare Observation Status Notification Given:  Yes    Lawerance SabalDebbie Jammal Sarr, RN 09/18/2016, 1:37 PM

## 2016-09-18 NOTE — Evaluation (Signed)
Occupational Therapy Evaluation Patient Details Name: Tammy [name] MRN: [id number] DOB: October 22, 1940 Today's Date: 09/18/2016    History of Present Illness 75 y.o. female with medical history significant for hypertension, dementia, heavy tobacco history, and   history of frequent UTIs  presenting tot he ED with acute confusional state.    Clinical Impression   Per pts son, pt required assist with tub transfer but was otherwise independent with BADL and used a RW for mobility PTA. Currently pt total assist for basic transfers and ADL. Pt minimally participating in activities this session; perseverating on wanting to go home alone and just getting out of the hospital. Pt with difficulty weight bearing on bil feet during transfer. Recommending SNF for follow up to maximize independence and safety with ADL and functional mobility prior to return home. Pt would benefit from continued skilled OT to address established goals.    Follow Up Recommendations  SNF;Supervision/Assistance - 24 hour    Equipment Recommendations  Other (comment) (TBD)    Recommendations for Other Services PT consult     Precautions / Restrictions Precautions Precautions: Fall Restrictions Weight Bearing Restrictions: No      Mobility Bed Mobility Overal bed mobility: Needs Assistance Bed Mobility: Supine to Sit;Sit to Supine     Supine to sit: Max assist Sit to supine: Max assist   General bed mobility comments: HOB elevated with use of bed rails. Cues for seqeuncing.  Transfers Overall transfer level: Needs assistance   Transfers: Sit to/from Stand;Stand Pivot Transfers Sit to Stand: Total assist Stand pivot transfers: Total assist       General transfer comment: Pt not weight bearing throughout bil feet due to pain.    Balance Overall balance assessment: Needs assistance Sitting-balance support: Feet supported;Bilateral upper extremity supported Sitting balance-Leahy Scale:  Poor Sitting balance - Comments: Min assist to maintain sitting balance. Pt with posterior and R lateral lean Postural control: Posterior lean;Right lateral lean Standing balance support: Bilateral upper extremity supported Standing balance-Leahy Scale: Zero Standing balance comment: Total assist to maintain standing balance                            ADL Overall ADL's : Needs assistance/impaired Eating/Feeding: Set up;Sitting               Upper Body Dressing : Maximal assistance;Sitting       Toilet Transfer: Total assistance;Stand-pivot Toilet Transfer Details (indicate cue type and reason): Simulated by stand pivot to chair. Pt unable to weight bear on feet due to pain; total assist required. Toileting- Clothing Manipulation and Hygiene: Total assistance       Functional mobility during ADLs: Total assistance (for stand pivot only) General ADL Comments: Pt found incontient of urine upon arrival; total assist to clean. Minimally participating in activities; perseverating on "just getting home from the hospital"     Vision     Perception     Praxis      Pertinent Vitals/Pain Pain Assessment: Faces Faces Pain Scale: Hurts whole lot Pain Location: bil feet with WB Pain Descriptors / Indicators: Grimacing;Moaning Pain Intervention(s): Limited activity within patient's tolerance;Monitored during session     Hand Dominance     Extremity/Trunk Assessment Upper Extremity Assessment Upper Extremity Assessment: Generalized weakness   Lower Extremity Assessment Lower Extremity Assessment: Defer to PT evaluation   Cervical / Trunk Assessment Cervical / Trunk Assessment: Kyphotic   Communication Communication Communication: Expressive difficulties (difficult to understand  at times, confused)   Cognition Arousal/Alertness: Awake/alert Behavior During Therapy: Flat affect;Agitated Overall Cognitive Status: Impaired/Different from baseline Area of  Impairment: Orientation;Attention;Memory;Following commands;Safety/judgement;Awareness;Problem solving Orientation Level: Disoriented to;Place;Time;Situation Current Attention Level: Focused Memory: Decreased short-term memory Following Commands: Follows one step commands inconsistently Safety/Judgement: Decreased awareness of safety;Decreased awareness of deficits Awareness: Intellectual Problem Solving: Decreased initiation;Difficulty sequencing;Requires verbal cues General Comments: hx of dementia. pt reports "I can go home alone"   General Comments       Exercises       Shoulder Instructions      Home Living Family/patient expects to be discharged to:: Private residence Living Arrangements: Alone Available Help at Discharge: Family;Available PRN/intermittently Type of Home: House Home Access: Level entry     Home Layout: One level     Bathroom Shower/Tub: Chief Strategy OfficerTub/shower unit   Bathroom Toilet: Standard     Home Equipment: Walker - 2 wheels          Prior Functioning/Environment Level of Independence: Independent with assistive device(s);Needs assistance  Gait / Transfers Assistance Needed: RW for mobility. ADL's / Homemaking Assistance Needed: Needs assist with tub transfer but independent with all other BADL.            OT Problem List: Decreased strength;Decreased activity tolerance;Impaired balance (sitting and/or standing);Decreased safety awareness;Decreased knowledge of use of DME or AE;Decreased knowledge of precautions;Pain   OT Treatment/Interventions: Self-care/ADL training;DME and/or AE instruction;Therapeutic activities;Patient/family education;Cognitive remediation/compensation;Balance training    OT Goals(Current goals can be found in the care plan section) Acute Rehab OT Goals Patient Stated Goal: to go home OT Goal Formulation: With patient Time For Goal Achievement: 10/02/16 Potential to Achieve Goals: Fair ADL Goals Pt Will Perform  Grooming: with supervision;sitting Pt Will Perform Upper Body Bathing: with supervision;sitting Pt Will Perform Lower Body Bathing: with min guard assist;sit to/from stand Pt Will Transfer to Toilet: with min assist;ambulating;bedside commode Pt Will Perform Toileting - Clothing Manipulation and hygiene: with min guard assist;sit to/from stand  OT Frequency: Min 2X/week   Barriers to D/C: Decreased caregiver support  pt lives alone       Co-evaluation              End of Session Nurse Communication: Mobility status  Activity Tolerance: Patient limited by pain Patient left: in bed;with call bell/phone within reach;with bed alarm set;with family/visitor present   Time: 1142-1209 OT Time Calculation (min): 27 min Charges:  OT General Charges $OT Visit: 1 Procedure OT Evaluation $OT Eval Moderate Complexity: 1 Procedure OT Treatments $Self Care/Home Management : 8-22 mins G-Codes: OT G-codes **NOT FOR INPATIENT CLASS** Functional Assessment Tool Used: Clinical judgement Functional Limitation: Self care Self Care Current Status (V4098(G8987): At least 80 percent but less than 100 percent impaired, limited or restricted Self Care Goal Status (J1914(G8988): At least 40 percent but less than 60 percent impaired, limited or restricted   Gaye AlkenBailey A Annemarie Sebree M.S., OTR/L Pager: [phone]  09/18/2016, 1:59 PM

## 2016-09-18 NOTE — NC FL2 (Signed)
[place] MEDICAID FL2 LEVEL OF CARE SCREENING TOOL     IDENTIFICATION  Patient Name: Tammy [name] Birthdate: 1941-08-03 Sex: female Admission Date (Current Location): 09/17/2016  [facility] [place]   Facility and Address:  [facility] [place] [address], [place], [place] [address]      Provider Number: [id number]  Attending Physician Name and Address:  Osvaldo ShipperGokul Krishnan, MD  Relative Name and Phone Number:       Current Level of Care: Hospital Recommended Level of Care: Skilled Nursing Facility Prior Approval Number:    Date Approved/Denied:   PASRR Number: [id number]  Discharge Plan: SNF    Current Diagnoses: Patient Active Problem List   Diagnosis Date Noted  . Complicated UTI (urinary tract infection)   . Acute confusional state 09/17/2016  . Rhabdomyolysis 07/02/2016  . Fall at home 07/01/2016  . Lactic acid acidosis 12/01/2015  . Metabolic acidosis 12/01/2015  . Protein-calorie malnutrition, severe (HCC) 12/01/2015  . AKI (acute kidney injury) (HCC)   . Hyponatremia   . Hypothermia   . Tachycardia   . Hyperkalemia 11/29/2015  . Acute renal failure (HCC) 11/29/2015  . Chronic combined systolic and diastolic CHF, 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/2015  . Mitral regurgitation 11/29/2015  . Elevated lactic acid level 11/29/2015  . Agitation 11/29/2015  . COPD (chronic obstructive pulmonary disease) (HCC) 11/29/2015  . Dementia 11/29/2015  . Confusion   . Acute blood loss anemia 08/21/2015  . Postoperative anemia due to acute blood loss 08/20/2015  . Displaced intertrochanteric fracture of left femur (HCC) 08/19/2015  . Closed left hip fracture (HCC) 08/18/2015  . Secondary cardiomyopathy (HCC)   . Acute systolic congestive heart failure, NYHA class 3 (HCC) 03/02/2015  . COPD exacerbation (HCC) 03/02/2015  . Tobacco abuse 03/02/2015  . Severe protein-calorie malnutrition (HCC) 03/02/2015  . HTN (hypertension)  03/02/2015  . Congestive dilated cardiomyopathy (HCC)   . CVA (cerebral infarction) 02/28/2015  . Stroke (HCC)     Orientation RESPIRATION BLADDER Height & Weight     Self, Time  Normal Continent Weight: 118 lb 14.4 oz (53.9 kg) Height:     BEHAVIORAL SYMPTOMS/MOOD NEUROLOGICAL BOWEL NUTRITION STATUS      Continent Diet (see DC summary)  AMBULATORY STATUS COMMUNICATION OF NEEDS Skin   Extensive Assist Verbally Normal                       Personal Care Assistance Level of Assistance  Bathing, Dressing Bathing Assistance: Maximum assistance   Dressing Assistance: Maximum assistance     Functional Limitations Info             SPECIAL CARE FACTORS FREQUENCY  PT (By licensed PT), OT (By licensed OT)     PT Frequency: 5/wk OT Frequency: 5/wk            Contractures      Additional Factors Info  Code Status, Allergies Code Status Info: FULL Allergies Info: NKA           Current Medications (09/18/2016):  This is the current hospital active medication list Current Facility-Administered Medications  Medication Dose Route Frequency Provider Last Rate Last Dose  . 0.9 %  sodium chloride infusion   Intravenous Continuous Osvaldo ShipperGokul Krishnan, MD 50 mL/hr at 09/18/16 1027    . acetaminophen (TYLENOL) tablet 650 mg  650 mg Oral Q6H PRN Marcos EkeSara E Wertman, PA-C       Or  . acetaminophen (TYLENOL)  suppository 650 mg  650 mg Rectal Q6H PRN Marcos EkeSara E Wertman, PA-C      . bisacodyl (DULCOLAX) suppository 10 mg  10 mg Rectal Daily PRN Marcos EkeSara E Wertman, PA-C      . cefTRIAXone (ROCEPHIN) 1 g in dextrose 5 % 50 mL IVPB  1 g Intravenous Q24H Darl Householderlison M Masters, RPH 100 mL/hr at 09/17/16 1553 1 g at 09/17/16 1553  . heparin injection 5,000 Units  5,000 Units Subcutaneous Q8H Marcos EkeSara E Wertman, PA-C   5,000 Units at 09/18/16 1527  . HYDROcodone-acetaminophen (NORCO/VICODIN) 5-325 MG per tablet 1-2 tablet  1-2 tablet Oral Q4H PRN Marcos EkeSara E Wertman, PA-C      . LORazepam (ATIVAN) tablet 0.5-1 mg   0.5-1 mg Oral Q12H PRN Ozella Rocksavid J Merrell, MD   0.5 mg at 09/17/16 2059  . magnesium citrate solution 1 Bottle  1 Bottle Oral Once PRN Marcos EkeSara E Wertman, PA-C      . ondansetron [facility](ZOFRAN) tablet 4 mg  4 mg Oral Q6H PRN Marcos EkeSara E Wertman, PA-C       Or  . ondansetron [facility](ZOFRAN) injection 4 mg  4 mg Intravenous Q6H PRN Marcos EkeSara E Wertman, PA-C      . predniSONE (DELTASONE) tablet 20 mg  20 mg Oral BID WC Osvaldo ShipperGokul Krishnan, MD      . senna-docusate (Senokot-S) tablet 1 tablet  1 tablet Oral QHS PRN Marcos EkeSara E Wertman, PA-C      . sodium polystyrene (KAYEXALATE) 15 GM/60ML suspension 15 g  15 g Oral Once Audry Piliyler Mohr, PA-C         Discharge Medications: Please see discharge summary for a list of discharge medications.  Relevant Imaging Results:  Relevant Lab Results:   Additional Information Social Security #: [id number]  Burna SisUris, Amaziah Raisanen H, LCSW

## 2016-09-18 NOTE — Care Management Note (Signed)
Case Management Note  Patient Details  Name: Tammy [name] MRN: [id number] Date of Birth: [birth year]  Subjective/Objective:                 Spoke with patient's son Debby Budndre at the bedside. Patient asleep, AMS. Patient lives at home alone, uses walker prior to admission. Son lives close and comes to house everyday to cook and clean. Son concerned about functional status and AMS. PT eval pending. Provided private duty list as requested.    Action/Plan:  Anticipate HH or SNF at DC. CM will continue to follow.   Expected Discharge Date:                  Expected Discharge Plan:  Home w Home Health Services  In-House Referral:     Discharge planning Services  CM Consult  Post Acute Care Choice:    Choice offered to:     DME Arranged:    DME Agency:     HH Arranged:    HH Agency:     Status of Service:  In process, will continue to follow  If discussed at Long Length of Stay Meetings, dates discussed:    Additional Comments:  Lawerance SabalDebbie Chivonne Rascon, RN 09/18/2016, 2:05 PM

## 2016-09-19 DIAGNOSIS — E875 Hyperkalemia: Secondary | ICD-10-CM | POA: Diagnosis present

## 2016-09-19 DIAGNOSIS — N179 Acute kidney failure, unspecified: Secondary | ICD-10-CM | POA: Diagnosis present

## 2016-09-19 DIAGNOSIS — D649 Anemia, unspecified: Secondary | ICD-10-CM | POA: Diagnosis present

## 2016-09-19 DIAGNOSIS — Z8673 Personal history of transient ischemic attack (TIA), and cerebral infarction without residual deficits: Secondary | ICD-10-CM | POA: Diagnosis not present

## 2016-09-19 DIAGNOSIS — E860 Dehydration: Secondary | ICD-10-CM | POA: Diagnosis present

## 2016-09-19 DIAGNOSIS — E870 Hyperosmolality and hypernatremia: Secondary | ICD-10-CM | POA: Diagnosis not present

## 2016-09-19 DIAGNOSIS — N390 Urinary tract infection, site not specified: Secondary | ICD-10-CM | POA: Diagnosis present

## 2016-09-19 DIAGNOSIS — G9340 Encephalopathy, unspecified: Secondary | ICD-10-CM | POA: Diagnosis present

## 2016-09-19 DIAGNOSIS — N183 Chronic kidney disease, stage 3 (moderate): Secondary | ICD-10-CM | POA: Diagnosis present

## 2016-09-19 DIAGNOSIS — Z8249 Family history of ischemic heart disease and other diseases of the circulatory system: Secondary | ICD-10-CM | POA: Diagnosis not present

## 2016-09-19 DIAGNOSIS — F0150 Vascular dementia without behavioral disturbance: Secondary | ICD-10-CM | POA: Diagnosis not present

## 2016-09-19 DIAGNOSIS — T380X5A Adverse effect of glucocorticoids and synthetic analogues, initial encounter: Secondary | ICD-10-CM | POA: Diagnosis present

## 2016-09-19 DIAGNOSIS — R000 Tachycardia, unspecified: Secondary | ICD-10-CM | POA: Diagnosis present

## 2016-09-19 DIAGNOSIS — R8290 Unspecified abnormal findings in urine: Secondary | ICD-10-CM | POA: Diagnosis not present

## 2016-09-19 DIAGNOSIS — D72829 Elevated white blood cell count, unspecified: Secondary | ICD-10-CM | POA: Diagnosis present

## 2016-09-19 DIAGNOSIS — Z87891 Personal history of nicotine dependence: Secondary | ICD-10-CM | POA: Diagnosis not present

## 2016-09-19 DIAGNOSIS — J449 Chronic obstructive pulmonary disease, unspecified: Secondary | ICD-10-CM | POA: Diagnosis present

## 2016-09-19 DIAGNOSIS — E872 Acidosis: Secondary | ICD-10-CM | POA: Diagnosis present

## 2016-09-19 DIAGNOSIS — I10 Essential (primary) hypertension: Secondary | ICD-10-CM

## 2016-09-19 DIAGNOSIS — I130 Hypertensive heart and chronic kidney disease with heart failure and stage 1 through stage 4 chronic kidney disease, or unspecified chronic kidney disease: Secondary | ICD-10-CM | POA: Diagnosis present

## 2016-09-19 DIAGNOSIS — R4182 Altered mental status, unspecified: Secondary | ICD-10-CM | POA: Diagnosis present

## 2016-09-19 DIAGNOSIS — I420 Dilated cardiomyopathy: Secondary | ICD-10-CM | POA: Diagnosis present

## 2016-09-19 DIAGNOSIS — F0390 Unspecified dementia without behavioral disturbance: Secondary | ICD-10-CM | POA: Diagnosis present

## 2016-09-19 DIAGNOSIS — I5042 Chronic combined systolic (congestive) and diastolic (congestive) heart failure: Secondary | ICD-10-CM | POA: Diagnosis present

## 2016-09-19 DIAGNOSIS — R0902 Hypoxemia: Secondary | ICD-10-CM | POA: Diagnosis present

## 2016-09-19 DIAGNOSIS — Z87440 Personal history of urinary (tract) infections: Secondary | ICD-10-CM | POA: Diagnosis not present

## 2016-09-19 DIAGNOSIS — F05 Delirium due to known physiological condition: Secondary | ICD-10-CM | POA: Diagnosis present

## 2016-09-19 DIAGNOSIS — Z8659 Personal history of other mental and behavioral disorders: Secondary | ICD-10-CM | POA: Diagnosis not present

## 2016-09-19 LAB — BASIC METABOLIC PANEL WITH GFR
Anion gap: 6 (ref 5–15)
BUN: 55 mg/dL — ABNORMAL HIGH (ref 6–20)
CO2: 20 mmol/L — ABNORMAL LOW (ref 22–32)
Calcium: 9.6 mg/dL (ref 8.9–10.3)
Chloride: 123 mmol/L — ABNORMAL HIGH (ref 101–111)
Creatinine, Ser: 1.03 mg/dL — ABNORMAL HIGH (ref 0.44–1.00)
GFR calc Af Amer: 60 mL/min — ABNORMAL LOW
GFR calc non Af Amer: 51 mL/min — ABNORMAL LOW
Glucose, Bld: 141 mg/dL — ABNORMAL HIGH (ref 65–99)
Potassium: 5 mmol/L (ref 3.5–5.1)
Sodium: 149 mmol/L — ABNORMAL HIGH (ref 135–145)

## 2016-09-19 LAB — CBC
HCT: 30.4 % — ABNORMAL LOW (ref 36.0–46.0)
Hemoglobin: 10.1 g/dL — ABNORMAL LOW (ref 12.0–15.0)
MCH: 32.1 pg (ref 26.0–34.0)
MCHC: 33.2 g/dL (ref 30.0–36.0)
MCV: 96.5 fL (ref 78.0–100.0)
Platelets: 171 K/uL (ref 150–400)
RBC: 3.15 MIL/uL — ABNORMAL LOW (ref 3.87–5.11)
RDW: 13.7 % (ref 11.5–15.5)
WBC: 10.9 K/uL — ABNORMAL HIGH (ref 4.0–10.5)

## 2016-09-19 LAB — URINE CULTURE

## 2016-09-19 NOTE — Progress Notes (Addendum)
[facility] PROGRESS NOTE  Tammy [name] GNF:[id number] DOB: 02-03-1941 DOA: 09/17/2016  PCP: Tammy BradfordWHITE,CYNTHIA S, MD  Brief History/Interval Summary: 75 year old African-American female with a past medical history of hypertension, dementia, history of frequent UTIs, presented to the emergency department in acute confusional state. Apparently she was brought in by family. Patient was found to have abnormal UA. She was found to be in renal failure. She was hospitalized for management.  Reason for Visit: UTI and acute renal failure  Consultants: None  Procedures: None  Antibiotics: Ceftriaxone  Subjective/Interval History: Patient noted to be confused this morning. Asking for cigarettes.   ROS: Unable to obtain too much information from her.  Objective:  Vital Signs  Vitals:   09/18/16 0520 09/18/16 1434 09/18/16 2229 09/19/16 0603  BP: (!) 139/50 (!) 128/47 (!) 142/46 (!) 142/63  Pulse: (!) 121 (!) 108 (!) 122 96  Resp: 18 18 17 18   Temp: 98.3 F (36.8 C) 98.3 F (36.8 C) 97.3 F (36.3 C) 97.5 F (36.4 C)  TempSrc: Oral  Oral Oral  SpO2: 98%  99% 98%  Weight:    54 kg (119 lb)    Intake/Output Summary (Last 24 hours) at 09/19/16 1236 Last data filed at 09/19/16 1058  Gross per 24 hour  Intake             1080 ml  Output                0 ml  Net             1080 ml   Filed Weights   09/18/16 0335 09/19/16 0603  Weight: 53.9 kg (118 lb 14.4 oz) 54 kg (119 lb)    General appearance: alert, cooperative, distracted and no distress Resp: clear to auscultation bilaterally Cardio: regular rate and rhythm, S1, S2 normal, no murmur, click, rub or gallop GI: soft, non-tender; bowel sounds normal; no masses,  no organomegaly Extremities: Improvement in erythema noted over the left thumb area. She is able to move her legs much better than yesterday. Not as tender to touch.  Neurologic: Awake, alert. Somewhat delirious. No facial asymmetry. Tongue is midline.  Moving all her extremities. No focal deficits.  Lab Results:  Data Reviewed: I have personally reviewed following labs and imaging studies  CBC:  Recent Labs Lab 09/17/16 1114 09/17/16 1208 09/18/16 0415 09/19/16 0326  WBC  --  11.1* 12.8* 10.9*  HGB 13.9 13.3 10.4* 10.1*  HCT 41.0 39.6 30.7* 30.4*  MCV  --  97.1 96.2 96.5  PLT  --  206 177 171    Basic Metabolic Panel:  Recent Labs Lab 09/17/16 1114 09/17/16 1208 09/18/16 0415 09/19/16 0326  NA 138 137 143 149*  K 6.9* 5.9* 4.6 5.0  CL 113* 105 117* 123*  CO2  --  18* 15* 20*  GLUCOSE 104* 125* 94 141*  BUN >140* 168* 109* 55*  CREATININE 3.80* 3.84* 1.65* 1.03*  CALCIUM  --  10.7* 9.1 9.6    GFR: Estimated Creatinine Clearance: 39.6 mL/min (by C-G formula based on SCr of 1.03 mg/dL (H)).  Liver Function Tests:  Recent Labs Lab 09/18/16 0415  AST 34  ALT 22  ALKPHOS 35*  BILITOT 0.6  PROT 6.3*  ALBUMIN 3.4*    CBG:  Recent Labs Lab 09/17/16 1025  GLUCAP 73     Recent Results (from the past 240 hour([name]))  Blood culture (routine x 2)     Status: None (Preliminary result)  Collection Time: 09/17/16 12:17 PM  Result Value Ref Range Status   Specimen Description BLOOD RIGHT ANTECUBITAL  Final   Special Requests BOTTLES DRAWN AEROBIC AND ANAEROBIC  3CC  Final   Culture NO GROWTH 1 DAY  Final   Report Status PENDING  Incomplete  Urine culture     Status: Abnormal   Collection Time: 09/17/16  1:11 PM  Result Value Ref Range Status   Specimen Description URINE, CLEAN CATCH  Final   Special Requests NONE  Final   Culture MULTIPLE SPECIES PRESENT, SUGGEST RECOLLECTION (A)  Final   Report Status 09/18/2016 FINAL  Final  Blood culture (routine x 2)     Status: None (Preliminary result)   Collection Time: 09/17/16  2:42 PM  Result Value Ref Range Status   Specimen Description BLOOD LEFT ANTECUBITAL  Final   Special Requests IN PEDIATRIC BOTTLE 2CC  Final   Culture NO GROWTH < 24 HOURS  Final    Report Status PENDING  Incomplete  Urine culture     Status: Abnormal   Collection Time: 09/17/16  4:57 PM  Result Value Ref Range Status   Specimen Description URINE, RANDOM  Final   Special Requests NONE  Final   Culture MULTIPLE SPECIES PRESENT, SUGGEST RECOLLECTION (A)  Final   Report Status 09/19/2016 FINAL  Final      Radiology Studies: No results found.   Medications:  Scheduled: . cefTRIAXone (ROCEPHIN)  IV  1 g Intravenous Q24H  . heparin  5,000 Units Subcutaneous Q8H  . predniSONE  20 mg Oral BID WC  . sodium polystyrene  15 g Oral Once   Continuous: . sodium chloride 50 mL/hr at 09/18/16 2149   ZOX:[id number]:acetaminophen **OR** acetaminophen, bisacodyl, HYDROcodone-acetaminophen, LORazepam, magnesium citrate, ondansetron **OR** ondansetron (ZOFRAN) IV, senna-docusate  Assessment/Plan:  Active Problems:   Stroke (HCC)   Tobacco abuse   HTN (hypertension)   Congestive dilated cardiomyopathy (HCC)   Hyperkalemia   Acute renal failure (HCC)   Chronic combined systolic and diastolic CHF, NYHA class 2 (HCC)   COPD (chronic obstructive pulmonary disease) (HCC)   Dementia   Confusion   AKI (acute kidney injury) (HCC)   Acute confusional state   Complicated UTI (urinary tract infection)    Acute encephalopathy This is in the setting of a patient with she had a period, possibly due to UTI. Patient was also dehydrated and in acute renal failure, which could have also contributed. Somewhat delirious this morning. Continue to monitor for now. She was started on steroids yesterday, which could be contributing.    Acute failure with hyperkalemia with Non AG Metabolic acidosis Renal function is improved. Potassium has improved. Monitor urine output. Hypernatremia noted today. IV fluid continues to go at 125 ML per hour despite order yesterday to reduce rate. Instructed [name] this morning to cut down rate. Labs tomorrow morning.   Elevated uric acid Possible gout. Could be the  reason for her pain in the lower extremities. Cannot use NSAIDs due to renal failure. Continue steroids for now. Cut back on those tomorrow.   Abnormal UA. Cultures are not showing any specific organism. Continue ceftriaxone for now.  Essential Hypertension  Has not been on antihypertesives since 09/2015. Blood pressure is reasonably well controlled. Continue to monitor.  Chronic Combined systolic diastolic CHF/ Congestive DCM/ MR  Last 2 D echo 03/2015, EF 40-45, mod reduced syst function and Gr2 DD. Patient is no longer on Coreg since 09/2016. Monitor I/O and weight.  Normocytic anemia. Drop in hemoglobin is likely dilutional. Stable this morning. Continue to watch.  COPD, hypoxic on presentation Continue oxygen. Chest x-ray did not show any infiltrates. Lungs are clear to auscultation bilaterally today.  Deconditioning OT/PT   DVT Prophylaxis: SQ Heparin    Code Status: Full Code  Family Communication: No family at bedside  Disposition Plan: Management as above. She will likely need to go to SNF at the time of discharge.    LOS: 0 days   [facility]  [facility] Pager [phone] 09/19/2016, 12:36 PM  If 7PM-7AM, please contact night-coverage at [email or web address], password [facility]

## 2016-09-19 NOTE — Evaluation (Signed)
Physical Therapy Evaluation Patient Details Name: Tammy [name] MRN: [id number] DOB: 10-10-1940 Today's Date: 09/19/2016   History of Present Illness  75 y.o. female with medical history significant for hypertension, dementia, heavy tobacco history, and   history of frequent UTIs  presenting tot he ED with acute confusional state.   Clinical Impression  Pt admitted with above diagnosis. Pt currently with functional limitations due to the deficits listed below (see PT Problem List). Pt will benefit from skilled PT to increase their independence and safety with mobility to allow discharge to the venue listed below.  Pt with confusion throughout session which limited her participation with PT.  Recommend 24 hour S due to cognitive status and hx of decreased mobility.  Recommend SNF.     Follow Up Recommendations SNF;Supervision/Assistance - 24 hour    Equipment Recommendations  None recommended by PT    Recommendations for Other Services       Precautions / Restrictions Precautions Precautions: Fall Restrictions Weight Bearing Restrictions: No      Mobility  Bed Mobility   Bed Mobility: Supine to Sit;Sit to Supine     Supine to sit: Max assist Sit to supine: Min assist   General bed mobility comments: Pt need MAX A to get to EOB, once EOB, pt got agitated and stated she would not get up into chair.  Offered lateral scoot transfer and pt laid back down and PT A with legs.  Transfers                 General transfer comment: refusing to stand  Ambulation/Gait                Stairs            Wheelchair Mobility    Modified Rankin (Stroke Patients Only)       Balance Overall balance assessment: Needs assistance   Sitting balance-Leahy Scale: Poor                                       Pertinent Vitals/Pain Pain Assessment: Faces Faces Pain Scale: Hurts even more Pain Location: R ankle Pain Intervention(s): Limited  activity within patient's tolerance;Monitored during session;Repositioned    Home Living Family/patient expects to be discharged to:: Private residence Living Arrangements: Alone Available Help at Discharge: Family;Available PRN/intermittently Type of Home: House Home Access: Level entry     Home Layout: One level Home Equipment: Walker - 2 wheels      Prior Function Level of Independence: Independent with assistive device(s);Needs assistance   Gait / Transfers Assistance Needed: RW for mobility.  ADL's / Homemaking Assistance Needed: Needs assist with tub transfer but independent with all other BADL.        Hand Dominance        Extremity/Trunk Assessment   Upper Extremity Assessment Upper Extremity Assessment: Defer to OT evaluation    Lower Extremity Assessment Lower Extremity Assessment: Generalized weakness    Cervical / Trunk Assessment Cervical / Trunk Assessment: Kyphotic  Communication   Communication: Expressive difficulties (difficult to understand at times, confused)  Cognition Arousal/Alertness: Awake/alert Behavior During Therapy: Agitated Overall Cognitive Status: No family/caregiver present to determine baseline cognitive functioning   Orientation Level: Disoriented to;Place;Time;Situation   Memory: Decreased short-term memory Following Commands: Follows one step commands inconsistently Safety/Judgement: Decreased awareness of safety;Decreased awareness of deficits Awareness: Intellectual Problem Solving: Decreased initiation;Difficulty sequencing General Comments: Pt  asking for cigarettes throughout session. "When are you taking me to the hospital?" then when explained she was in hospital she stated "I know that"    General Comments      Exercises     Assessment/Plan    PT Assessment Patient needs continued PT services  PT Problem List Decreased strength;Decreased activity tolerance;Decreased balance;Decreased mobility          PT  Treatment Interventions DME instruction;Gait training;Functional mobility training;Therapeutic exercise;Therapeutic activities;Patient/family education    PT Goals (Current goals can be found in the Care Plan section)  Acute Rehab PT Goals PT Goal Formulation: Patient unable to participate in goal setting Time For Goal Achievement: 10/03/16 Potential to Achieve Goals: Fair    Frequency Min 2X/week   Barriers to discharge Decreased caregiver support does not have 24 hour S    Co-evaluation               End of Session   Activity Tolerance: Treatment limited secondary to agitation Patient left: in bed;with bed alarm set Nurse Communication: Mobility status (nurse tech)    Functional Assessment Tool Used: clinical judgement and objective findings Functional Limitation: Changing and maintaining body position Changing and Maintaining Body Position Current Status (Z6109(G8981): At least 40 percent but less than 60 percent impaired, limited or restricted Changing and Maintaining Body Position Goal Status (U0454(G8982): At least 1 percent but less than 20 percent impaired, limited or restricted    Time: 0920-0930 PT Time Calculation (min) (ACUTE ONLY): 10 min   Charges:   PT Evaluation $PT Eval Moderate Complexity: 1 Procedure     PT G Codes:   PT G-Codes **NOT FOR INPATIENT CLASS** Functional Assessment Tool Used: clinical judgement and objective findings Functional Limitation: Changing and maintaining body position Changing and Maintaining Body Position Current Status (U9811(G8981): At least 40 percent but less than 60 percent impaired, limited or restricted Changing and Maintaining Body Position Goal Status (B1478(G8982): At least 1 percent but less than 20 percent impaired, limited or restricted    [facility] Regional Medical CenterMITH,Jya Hughston LUBECK 09/19/2016, 11:04 AM

## 2016-09-20 DIAGNOSIS — G9340 Encephalopathy, unspecified: Secondary | ICD-10-CM

## 2016-09-20 DIAGNOSIS — N390 Urinary tract infection, site not specified: Secondary | ICD-10-CM

## 2016-09-20 DIAGNOSIS — Z8659 Personal history of other mental and behavioral disorders: Secondary | ICD-10-CM

## 2016-09-20 LAB — BASIC METABOLIC PANEL
Anion gap: 7 (ref 5–15)
Anion gap: 6 (ref 5–15)
BUN: 25 mg/dL — ABNORMAL HIGH (ref 6–20)
BUN: 18 mg/dL (ref 6–20)
CO2: 17 mmol/L — ABNORMAL LOW (ref 22–32)
CO2: 20 mmol/L — ABNORMAL LOW (ref 22–32)
Calcium: 9.2 mg/dL (ref 8.9–10.3)
Calcium: 8.7 mg/dL — ABNORMAL LOW (ref 8.9–10.3)
Chloride: 112 mmol/L — ABNORMAL HIGH (ref 101–111)
Chloride: 108 mmol/L (ref 101–111)
Creatinine, Ser: 0.88 mg/dL (ref 0.44–1.00)
Creatinine, Ser: 0.88 mg/dL (ref 0.44–1.00)
GFR calc Af Amer: >60 mL/min (ref >60)
GFR calc Af Amer: >60 mL/min (ref >60)
GFR calc non Af Amer: >60 mL/min (ref >60)
GFR calc non Af Amer: >60 mL/min (ref >60)
Glucose, Bld: 115 mg/dL — ABNORMAL HIGH (ref 65–99)
Glucose, Bld: 116 mg/dL — ABNORMAL HIGH (ref 65–99)
Potassium: 4.8 mmol/L (ref 3.5–5.1)
Potassium: 4.2 mmol/L (ref 3.5–5.1)
Sodium: 136 mmol/L (ref 135–145)
Sodium: 134 mmol/L — ABNORMAL LOW (ref 135–145)

## 2016-09-20 LAB — CBC
HCT: 27.4 % — ABNORMAL LOW (ref 36.0–46.0)
Hemoglobin: 9.2 g/dL — ABNORMAL LOW (ref 12.0–15.0)
MCH: 32.6 pg (ref 26.0–34.0)
MCHC: 33.6 g/dL (ref 30.0–36.0)
MCV: 97.2 fL (ref 78.0–100.0)
Platelets: 184 10*3/uL (ref 150–400)
RBC: 2.82 MIL/uL — ABNORMAL LOW (ref 3.87–5.11)
RDW: 13.6 % (ref 11.5–15.5)
WBC: 13.6 10*3/uL — ABNORMAL HIGH (ref 4.0–10.5)

## 2016-09-20 MED ORDER — DIPHENHYDRAMINE HCL 25 MG PO CAPS
25.0000 mg | ORAL_CAPSULE | Freq: Once | ORAL | Status: AC
  Administered 2016-09-20: 25 mg via ORAL
  Filled 2016-09-20: qty 1

## 2016-09-20 MED ORDER — CEFPODOXIME PROXETIL 200 MG PO TABS
200.0000 mg | ORAL_TABLET | Freq: Two times a day (BID) | ORAL | Status: DC
  Administered 2016-09-20 – 2016-09-22 (×5): 200 mg via ORAL
  Filled 2016-09-20 (×6): qty 1

## 2016-09-20 MED ORDER — PREDNISONE 20 MG PO TABS
20.0000 mg | ORAL_TABLET | Freq: Every day | ORAL | Status: DC
  Administered 2016-09-21 – 2016-09-22 (×2): 20 mg via ORAL
  Filled 2016-09-20 (×2): qty 1

## 2016-09-20 NOTE — Progress Notes (Signed)
Pt removed IV during night. Attempted IV x1. IV team consulted and refused to place another IV because pt keeps pulling sites out. Second nurse attempted x2. No IV able to be established. Will re-consult IV team.

## 2016-09-20 NOTE — Progress Notes (Signed)
CSW has contacted son several times today and their was no answer. CSW unable to leave voicemail due to son's voicemail being full. CSW will try again to contact patients son.  Marrianne MoodAshley Kaimen Peine, MSW,  [employer]LCSWA [phone]

## 2016-09-20 NOTE — Progress Notes (Signed)
[facility] PROGRESS NOTE  Tammy [name] [id number] DOB: 04/14/1941 DOA: 09/17/2016  PCP: Cala BradfordWHITE,CYNTHIA S, MD  Brief History/Interval Summary: 75 year old African-American female with a past medical history of hypertension, dementia, history of frequent UTIs, presented to the emergency department in acute confusional state. Apparently she was brought in by family. Patient was found to have abnormal UA. She was found to be in renal failure. She was hospitalized for management.  Reason for Visit: UTI and acute renal failure  Consultants: None  Procedures: None  Antibiotics: Ceftriaxone to be changed over to Cl[place]isVantin today.  Subjective/Interval History: Patient noted to be less confused this morning. Still somewhat agitated.  ROS: Unable to obtain too much information from her.  Objective:  Vital Signs  Vitals:   09/19/16 0603 09/19/16 1339 09/19/16 2114 09/20/16 0026  BP: (!) 142/63 (!) 146/55 (!) 163/64   Pulse: 96 97 (!) 105   Resp: 18  18   Temp: 97.5 F (36.4 C) 97.5 F (36.4 C) 98.3 F (36.8 C)   TempSrc: Oral Oral Oral   SpO2: 98%  100%   Weight: 54 kg (119 lb)   57.8 kg (127 lb 8 oz)    Intake/Output Summary (Last 24 hours) at 09/20/16 0839 Last data filed at 09/19/16 1058  Gross per 24 hour  Intake              840 ml  Output                0 ml  Net              840 ml   Filed Weights   09/18/16 0335 09/19/16 0603 09/20/16 0026  Weight: 53.9 kg (118 lb 14.4 oz) 54 kg (119 lb) 57.8 kg (127 lb 8 oz)    General appearance: alert, cooperative, distracted and no distress Resp: clear to auscultation bilaterally Cardio: regular rate and rhythm, S1, S2 normal, no murmur, click, rub or gallop GI: soft, non-tender; bowel sounds normal; no masses,  no organomegaly Extremities: Improvement in erythema noted over the left thumb area. She is able to move her legs much better than yesterday. Not as tender to touch.  Neurologic: Awake, alert. Somewhat  delirious. No facial asymmetry. Tongue is midline. Moving all her extremities. No focal deficits.  Lab Results:  Data Reviewed: I have personally reviewed following labs and imaging studies  CBC:  Recent Labs Lab 09/17/16 1114 09/17/16 1208 09/18/16 0415 09/19/16 0326 09/20/16 0536  WBC  --  11.1* 12.8* 10.9* 13.6*  HGB 13.9 13.3 10.4* 10.1* 9.2*  HCT 41.0 39.6 30.7* 30.4* 27.4*  MCV  --  97.1 96.2 96.5 97.2  PLT  --  206 177 171 184    Basic Metabolic Panel:  Recent Labs Lab 09/17/16 1114 09/17/16 1208 09/18/16 0415 09/19/16 0326 09/20/16 0536  NA 138 137 143 149* 136  K 6.9* 5.9* 4.6 5.0 4.8  CL 113* 105 117* 123* 112*  CO2  --  18* 15* 20* 17*  GLUCOSE 104* 125* 94 141* 115*  BUN >140* 168* 109* 55* 25*  CREATININE 3.80* 3.84* 1.65* 1.03* 0.88  CALCIUM  --  10.7* 9.1 9.6 9.2    GFR: Estimated Creatinine Clearance: 49.6 mL/min (by C-G formula based on SCr of 0.88 mg/dL).  Liver Function Tests:  Recent Labs Lab 09/18/16 0415  AST 34  ALT 22  ALKPHOS 35*  BILITOT 0.6  PROT 6.3*  ALBUMIN 3.4*    CBG:  Recent Labs  Lab 09/17/16 1025  GLUCAP 73     Recent Results (from the past 240 hour(s))  Blood culture (routine x 2)     Status: None (Preliminary result)   Collection Time: 09/17/16 12:17 PM  Result Value Ref Range Status   Specimen Description BLOOD RIGHT ANTECUBITAL  Final   Special Requests BOTTLES DRAWN AEROBIC AND ANAEROBIC  3CC  Final   Culture NO GROWTH 2 DAYS  Final   Report Status PENDING  Incomplete  Urine culture     Status: Abnormal   Collection Time: 09/17/16  1:11 PM  Result Value Ref Range Status   Specimen Description URINE, CLEAN CATCH  Final   Special Requests NONE  Final   Culture MULTIPLE SPECIES PRESENT, SUGGEST RECOLLECTION (A)  Final   Report Status 09/18/2016 FINAL  Final  Blood culture (routine x 2)     Status: None (Preliminary result)   Collection Time: 09/17/16  2:42 PM  Result Value Ref Range Status    Specimen Description BLOOD LEFT ANTECUBITAL  Final   Special Requests IN PEDIATRIC BOTTLE 2CC  Final   Culture NO GROWTH 2 DAYS  Final   Report Status PENDING  Incomplete  Urine culture     Status: Abnormal   Collection Time: 09/17/16  4:57 PM  Result Value Ref Range Status   Specimen Description URINE, RANDOM  Final   Special Requests NONE  Final   Culture MULTIPLE SPECIES PRESENT, SUGGEST RECOLLECTION (A)  Final   Report Status 09/19/2016 FINAL  Final      Radiology Studies: No results found.   Medications:  Scheduled: . cefTRIAXone (ROCEPHIN)  IV  1 g Intravenous Q24H  . heparin  5,000 Units Subcutaneous Q8H  . predniSONE  20 mg Oral BID WC  . sodium polystyrene  15 g Oral Once   Continuous: . sodium chloride Stopped (09/20/16 0159)   AOZ:[id number]:acetaminophen **OR** acetaminophen, bisacodyl, HYDROcodone-acetaminophen, LORazepam, magnesium citrate, ondansetron **OR** ondansetron (ZOFRAN) IV, senna-docusate  Assessment/Plan:  Active Problems:   Stroke (HCC)   Tobacco abuse   HTN (hypertension)   Congestive dilated cardiomyopathy (HCC)   Hyperkalemia   Acute renal failure (HCC)   Chronic combined systolic and diastolic CHF, NYHA class 2 (HCC)   COPD (chronic obstructive pulmonary disease) (HCC)   Dementia   Confusion   AKI (acute kidney injury) (HCC)   Acute confusional state   Complicated UTI (urinary tract infection)    Acute encephalopathy This is in the setting of a patient with Dementia, possibly due to UTI. Patient was also dehydrated and in acute renal failure, which could have also contributed. Mental status is somewhat better this morning. Continue to reorient daily. Mobilize. We will cut back on steroids.   Acute failure with hyperkalemia with Non AG Metabolic acidosis Renal failure has resolved. Renal function is back to normal. Potassium has improved. Monitor urine output. Sodium has improved. Acidosis is improved. Cut back on IV fluids.   Elevated uric  acid Possible gout. Could be the reason for her pain in the lower extremities. Cannot use NSAIDs due to renal failure. Continue steroids for now, but at a lower dose  Abnormal UA. Cultures are not showing any specific organism. Change to Vantin.  Essential Hypertension  Has not been on antihypertesives since 09/2015. Blood pressure is reasonably well controlled. Occasionally high at the times of agitation. Continue to monitor.  Chronic Combined systolic diastolic CHF/ Congestive DCM/ MR  Last 2 D echo 03/2015, EF 40-45, mod reduced syst function and  Gr2 DD. Patient is no longer on Coreg since 09/2016. Monitor I/O and weight.    Normocytic anemia. Drop in hemoglobin is likely dilutional. No overt bleeding identified. Continue to watch. Leukocytosis is due to steroids.  COPD, hypoxic on presentation Continue oxygen. Chest x-ray did not show any infiltrates. Lungs are clear to auscultation bilaterally.  Deconditioning OT/PT. short-term rehabilitation at skilled nursing facility is recommended. Discussed with patient's son today.  DVT Prophylaxis: SQ Heparin    Code Status: Full Code  Family Communication: No family at bedside  Disposition Plan: Management as above. She will likely need to go to SNF at the time of discharge.    LOS: 1 day   [facility]  [facility] Pager [phone] 09/20/2016, 8:39 AM  If 7PM-7AM, please contact night-coverage at [email or web address], password [facility]

## 2016-09-21 DIAGNOSIS — I5042 Chronic combined systolic (congestive) and diastolic (congestive) heart failure: Secondary | ICD-10-CM

## 2016-09-21 LAB — BASIC METABOLIC PANEL
Anion gap: 8 (ref 5–15)
BUN: 15 mg/dL (ref 6–20)
CO2: 19 mmol/L — ABNORMAL LOW (ref 22–32)
Calcium: 8.4 mg/dL — ABNORMAL LOW (ref 8.9–10.3)
Chloride: 105 mmol/L (ref 101–111)
Creatinine, Ser: 0.81 mg/dL (ref 0.44–1.00)
GFR calc Af Amer: >60 mL/min (ref >60)
GFR calc non Af Amer: >60 mL/min (ref >60)
Glucose, Bld: 91 mg/dL (ref 65–99)
Potassium: 4.5 mmol/L (ref 3.5–5.1)
Sodium: 132 mmol/L — ABNORMAL LOW (ref 135–145)

## 2016-09-21 MED ORDER — CEFPODOXIME PROXETIL 200 MG PO TABS: 200.0000 mg | ORAL_TABLET | Freq: Two times a day (BID) | ORAL | 0 refills | Status: AC

## 2016-09-21 MED ORDER — PREDNISONE 20 MG PO TABS: 20.0000 mg | ORAL_TABLET | Freq: Every day | ORAL | 0 refills | Status: AC

## 2016-09-21 MED ORDER — CARVEDILOL 3.125 MG PO TABS: 3.1250 mg | ORAL_TABLET | Freq: Two times a day (BID) | ORAL | Status: DC

## 2016-09-21 MED ORDER — DIPHENHYDRAMINE HCL 25 MG PO CAPS: 25.0000 mg | ORAL_CAPSULE | Freq: Every evening | ORAL | Status: DC | PRN

## 2016-09-21 MED ORDER — CARVEDILOL 3.125 MG PO TABS
3.1250 mg | ORAL_TABLET | Freq: Two times a day (BID) | ORAL | Status: DC
  Administered 2016-09-21 – 2016-09-22 (×3): 3.125 mg via ORAL
  Filled 2016-09-21 (×3): qty 1

## 2016-09-21 NOTE — Progress Notes (Signed)
[facility] PROGRESS NOTE  LASHAWNA POCHE ZOX:[id number] DOB: [birth year] DOA: 09/17/2016  PCP: Cala Bradford, MD  Brief History/Interval Summary: 75 year old African-American female with a past medical history of hypertension, dementia, history of frequent UTIs, presented to the emergency department in acute confusional state. Apparently she was brought in by family. Patient was found to have abnormal UA. She was found to be in renal failure. She was hospitalized for management.  Reason for Visit: UTI and acute renal failure  Consultants: None  Procedures: None  Antibiotics: Ceftriaxone changed to Vantin.  Subjective/Interval History: Patient states that she would like to go home. Does not want to be bothered this morning. Denies any complaints.  ROS: Unable to obtain too much information from her.  Objective:  Vital Signs  Vitals:   09/20/16 1425 09/20/16 2206 09/20/16 2311 09/21/16 0450  BP: (!) 172/71 (!) 184/71 (!) 172/74 (!) 159/71  Pulse: 88 98 100 (!) 108  Resp: 16 18  18   Temp: 97.8 F (36.6 C) 98.5 F (36.9 C)  98.1 F (36.7 C)  TempSrc: Oral Oral    SpO2: 100% 99%  100%  Weight:    58.3 kg (128 lb 8 oz)   No intake or output data in the 24 hours ending 09/21/16 0841 Filed Weights   09/19/16 0603 09/20/16 0026 09/21/16 0450  Weight: 54 kg (119 lb) 57.8 kg (127 lb 8 oz) 58.3 kg (128 lb 8 oz)    General appearance: alert, cooperative, distracted and no distress Resp: clear to auscultation bilaterally Cardio: regular rate and rhythm, S1, S2 normal, no murmur, click, rub or gallop GI: soft, non-tender; bowel sounds normal; no masses,  no organomegaly Extremities: Improvement in erythema noted over the left thumb area. She is able to move her legs much better than yesterday. Not as tender to touch.  Neurologic: Awake, alert. Appears to be much calmer. Moving all her extremities. Not. He cooperative with examination.   Lab Results:  Data  Reviewed: I have personally reviewed following labs and imaging studies  CBC:  Recent Labs Lab 09/17/16 1114 09/17/16 1208 09/18/16 0415 09/19/16 0326 09/20/16 0536  WBC  --  11.1* 12.8* 10.9* 13.6*  HGB 13.9 13.3 10.4* 10.1* 9.2*  HCT 41.0 39.6 30.7* 30.4* 27.4*  MCV  --  97.1 96.2 96.5 97.2  PLT  --  206 177 171 184    Basic Metabolic Panel:  Recent Labs Lab 09/18/16 0415 09/19/16 0326 09/20/16 0536 09/20/16 1955 09/21/16 0452  NA 143 149* 136 134* 132*  K 4.6 5.0 4.8 4.2 4.5  CL 117* 123* 112* 108 105  CO2 15* 20* 17* 20* 19*  GLUCOSE 94 141* 115* 116* 91  BUN 109* 55* 25* 18 15  CREATININE 1.65* 1.03* 0.88 0.88 0.81  CALCIUM 9.1 9.6 9.2 8.7* 8.4*    GFR: Estimated Creatinine Clearance: 54.3 mL/min (by C-G formula based on SCr of 0.81 mg/dL).  Liver Function Tests:  Recent Labs Lab 09/18/16 0415  AST 34  ALT 22  ALKPHOS 35*  BILITOT 0.6  PROT 6.3*  ALBUMIN 3.4*    CBG:  Recent Labs Lab 09/17/16 1025  GLUCAP 73     Recent Results (from the past 240 hour(s))  Blood culture (routine x 2)     Status: None (Preliminary result)   Collection Time: 09/17/16 12:17 PM  Result Value Ref Range Status   Specimen Description BLOOD RIGHT ANTECUBITAL  Final   Special Requests BOTTLES DRAWN AEROBIC AND ANAEROBIC  3CC  Final   Culture NO GROWTH 3 DAYS  Final   Report Status PENDING  Incomplete  Urine culture     Status: Abnormal   Collection Time: 09/17/16  1:11 PM  Result Value Ref Range Status   Specimen Description URINE, CLEAN CATCH  Final   Special Requests NONE  Final   Culture MULTIPLE SPECIES PRESENT, SUGGEST RECOLLECTION (A)  Final   Report Status 09/18/2016 FINAL  Final  Blood culture (routine x 2)     Status: None (Preliminary result)   Collection Time: 09/17/16  2:42 PM  Result Value Ref Range Status   Specimen Description BLOOD LEFT ANTECUBITAL  Final   Special Requests IN PEDIATRIC BOTTLE 2CC  Final   Culture NO GROWTH 3 DAYS  Final    Report Status PENDING  Incomplete  Urine culture     Status: Abnormal   Collection Time: 09/17/16  4:57 PM  Result Value Ref Range Status   Specimen Description URINE, RANDOM  Final   Special Requests NONE  Final   Culture MULTIPLE SPECIES PRESENT, SUGGEST RECOLLECTION (A)  Final   Report Status 09/19/2016 FINAL  Final      Radiology Studies: No results found.   Medications:  Scheduled: . cefpodoxime  200 mg Oral Q12H  . heparin  5,000 Units Subcutaneous Q8H  . predniSONE  20 mg Oral Q breakfast  . sodium polystyrene  15 g Oral Once   Continuous: . sodium chloride Stopped (09/20/16 0159)   ZHY:QMVHQIONGEXBMPRN:acetaminophen **OR** acetaminophen, bisacodyl, HYDROcodone-acetaminophen, LORazepam, magnesium citrate, ondansetron **OR** ondansetron (ZOFRAN) IV, senna-docusate  Assessment/Plan:  Active Problems:   Stroke (HCC)   Tobacco abuse   HTN (hypertension)   Congestive dilated cardiomyopathy (HCC)   Hyperkalemia   Acute renal failure (HCC)   Chronic combined systolic and diastolic CHF, NYHA class 2 (HCC)   COPD (chronic obstructive pulmonary disease) (HCC)   Dementia   Confusion   AKI (acute kidney injury) (HCC)   Acute confusional state   Complicated UTI (urinary tract infection)    Acute encephalopathy This is in the setting of a patient with Dementia, possibly due to UTI. Patient was also dehydrated and in acute renal failure, which could have also contributed. Mental status has improved. Patient does not have any focal neurological deficits. Continue to reorient daily. Mobilize.   Acute failure with hyperkalemia with Non AG Metabolic acidosis Renal failure has resolved. Renal function is back to normal. Potassium has improved. Monitor urine output. Sodium has improved. Acidosis is improved. Cut back on IV fluids.   Elevated uric acid Possible gout. Could be the reason for her pain in the lower extremities. Cannot use NSAIDs due to renal failure. Continue steroids for now, but  at a lower dose. Stop after a few days.  Abnormal UA. Cultures are not showing any specific organism. Changed to Vantin.  Essential Hypertension  Her pressure is not controlled. She used to be on carvedilol previously. We will reinitiate.   Chronic Combined systolic diastolic CHF/ Congestive DCM/ MR  Last 2 D echo 03/2015, EF 40-45, mod reduced syst function and Gr2 DD. Monitor I/O and weight. Appears to be well compensated.  Normocytic anemia. Drop in hemoglobin is likely dilutional. No overt bleeding identified. Continue to watch. Leukocytosis is due to steroids.  COPD, hypoxic on presentation Continue oxygen. Chest x-ray did not show any infiltrates. Lungs are clear to auscultation bilaterally.  Deconditioning OT/PT. short-term rehabilitation at skilled nursing facility is recommended. Discussed with patient's son today. He wants to  take her home. He states that she has been to skilled nursing facility previously and has not done well there.  DVT Prophylaxis: SQ Heparin    Code Status: Full Code  Family Communication: No family at bedside. Discussed with son over the phone Disposition Plan: Management as above. Anticipate discharge tomorrow.    LOS: 2 days   [facility] Center[facility]ISHNAN,Christian Treadway  [facility] Pager [phone] 09/21/2016, 8:41 AM  If 7PM-7AM, please contact night-coverage at [email or web address], password [email or web address]

## 2016-09-22 LAB — CULTURE, BLOOD (ROUTINE X 2)
Culture: NO GROWTH
Culture: NO GROWTH

## 2016-09-22 NOTE — Progress Notes (Addendum)
Attempt to call patient's son, per her request, to [facility]. No answer, unable to leave VM. Will continue to attempt to contact.  12:36 Attempt to call again, unsuccessful. 13:10 After third attempt to call from [phone], with no answer, called from [phone] and patien't son Tammy [name] answered. Stated he was familiar with [facility] and they would like to use them after DC. Referral made.

## 2016-09-22 NOTE — Progress Notes (Signed)
Physical Therapy Treatment Patient Details Name: Tammy [name] MRN: [id number] DOB: 1941-09-24 Today's Date: 09/22/2016    History of Present Illness 75 y.o. female with medical history significant for hypertension, dementia, heavy tobacco history, and   history of frequent UTIs  presenting tot he ED with acute confusional state.     PT Comments    Pt performed increased mobility from previous session advancing to gait in halls.  Pt reports she will return home with assistance from her son.  Pt able to tolerate increased transfer activities with decreased assist.  Pt continues to benefit from skilled therapy at SNF but son and patient refusing.  Will recommend HHPT at this time.     Follow Up Recommendations  SNF;Supervision/Assistance - 24 hour     Equipment Recommendations  None recommended by PT    Recommendations for Other Services       Precautions / Restrictions Precautions Precautions: Fall Restrictions Weight Bearing Restrictions: No    Mobility  Bed Mobility Overal bed mobility: Needs Assistance Bed Mobility: Supine to Sit;Sit to Supine     Supine to sit: Min assist Sit to supine: Supervision   General bed mobility comments: Assist to elevate trunk into sitting.    Transfers Overall transfer level: Needs assistance Equipment used: Rolling walker (2 wheeled) Transfers: Sit to/from Stand Sit to Stand: Min assist         General transfer comment: Cues for hand placement to and from seated surface.  Performed from edge of bed and chair with arms.    Ambulation/Gait Ambulation/Gait assistance: Min assist Ambulation Distance (Feet): 140 Feet Assistive device: Rolling walker (2 wheeled) Gait Pattern/deviations: Step-through pattern;Trunk flexed;Decreased stride length;Shuffle   Gait velocity interpretation: Below normal speed for age/gender General Gait Details: Cues for RW position, upper trunk control and increasing B stride length reports throughout  gait.  "Don't you know I am old and tired."   Information systems managertairs            Wheelchair Mobility    Modified Rankin (Stroke Patients Only)       Balance Overall balance assessment: Needs assistance   Sitting balance-Leahy Scale: Poor       Standing balance-Leahy Scale: Zero                      Cognition Arousal/Alertness: Awake/alert Behavior During Therapy: Agitated;WFL for tasks assessed/performed Overall Cognitive Status: No family/caregiver present to determine baseline cognitive functioning Area of Impairment: Attention;Safety/judgement Orientation Level: Situation     Following Commands: Follows one step commands inconsistently Safety/Judgement: Decreased awareness of safety;Decreased awareness of deficits          Exercises      General Comments        Pertinent Vitals/Pain Pain Assessment: No/denies pain    Home Living                      Prior Function            PT Goals (current goals can now be found in the care plan section) Acute Rehab PT Goals Patient Stated Goal: to go home Potential to Achieve Goals: Fair Progress towards PT goals: Progressing toward goals    Frequency    Min 2X/week      PT Plan Current plan remains appropriate    Co-evaluation             End of Session Equipment Utilized During Treatment: Gait belt Activity Tolerance: Treatment limited  secondary to agitation Patient left: in bed;with bed alarm set;with call bell/phone within reach     Time: 1226-1253 PT Time Calculation (min) (ACUTE ONLY): 27 min  Charges:  $Gait Training: 8-22 mins $Therapeutic Activity: 8-22 mins                    G Codes:      Tammy [name] 09/22/2016, 12:56 PM Joycelyn RuaAimee Crytal Pensinger, PTA pager [phone]\

## 2016-09-22 NOTE — Progress Notes (Signed)
Per MD patient son plans to take patient home at DC- no further CSW needs  CSW signing off- please reconsult if needed  Burna SisJenna H. Elisha Mcgruder, LCSW Clinical Social Worker [phone]

## 2016-09-22 NOTE — Discharge Summary (Signed)
[facility]  Physician Discharge Summary   Patient ID: Tammy [name] MRN: [id number] DOB/AGE: August 22, 1941 75 y.o.  Admit date: 09/17/2016 Discharge date: 09/22/2016  PCP: Cala Bradford, MD  DISCHARGE DIAGNOSES:  Active Problems:   Stroke (HCC)   Tobacco abuse   HTN (hypertension)   Congestive dilated cardiomyopathy (HCC)   Hyperkalemia   Acute renal failure (HCC)   Chronic combined systolic and diastolic CHF, NYHA class 2 (HCC)   COPD (chronic obstructive pulmonary disease) (HCC)   Dementia   Confusion   AKI (acute kidney injury) (HCC)   Acute confusional state   Complicated UTI (urinary tract infection)   RECOMMENDATIONS FOR OUTPATIENT FOLLOW UP: 1. Home health has been ordered. Patient and son declined skilled nursing facility placement 2. Recommend checking uric acid level at follow-up.   DISCHARGE CONDITION: fair  Diet recommendation: As before  [facility] Weights   09/19/16 0603 09/20/16 0026 09/21/16 0450  Weight: 54 kg (119 lb) 57.8 kg (127 lb 8 oz) 58.3 kg (128 lb 8 oz)    INITIAL HISTORY: 75 year old African-American female with a past medical history of hypertension, dementia, history of frequent UTIs, presented to the emergency department in acute confusional state. Apparently she was brought in by family. Patient was found to have abnormal UA. She was found to be in renal failure. She was hospitalized for management.   HOSPITAL COURSE:   Acute encephalopathy This is in the setting of a patient with Dementia, possibly due to UTI. Patient was also dehydrated and in acute renal failure, which could have also contributed. Mental status has improved. Patient does not have any focal neurological deficits. Some degree of confusion will remain due to her history of dementia and may take a long time for her to fully return to baseline.   Acute failure with hyperkalemia with Non AG Metabolic acidosis Renal failure has resolved. Renal function is back  to normal. Potassium has improved. Sodium has improved. Acidosis is improved.    Elevated uric acid Possible gout. Could be the reason for her pain in the lower extremities. Cannot use NSAIDs due to renal failure. Continue steroids for now, but at a lower dose. Stop after a few days. Defer definitive management to outpatient providers.  Abnormal UA. Cultures are not showing any specific organism. Changed to Vantin.  Essential Hypertension  Blood pressure is not controlled. She used to be on carvedilol previously. We will reinitiate. Improved.  Chronic Combined systolic diastolic CHF/ Congestive DCM/ MR  Last 2 D echo 03/2015, EF 40-45, mod reduced syst function and Gr2 DD. Monitor I/O and weight. Appears to be well compensated.  Normocytic anemia. Drop in hemoglobin is likely dilutional. No overt bleeding identified. Leukocytosis is due to steroids.  COPD, hypoxic on presentation Chest x-ray did not show any infiltrates. Lungs are clear to auscultation bilaterally. Now saturating normally on room air.  Deconditioning OT/PT. short-term rehabilitation at skilled nursing facility is recommended. Discussed with patient's son. He wants to take her home. He states that she has been to skilled nursing facility previously and has not done well there. We will order home health.  Overall, patient is stable. She has improved. Discussed in detail with son yesterday. He wishes to take her home. Okay for discharge today.   PERTINENT LABS:  The results of significant diagnostics from this hospitalization (including imaging, microbiology, ancillary and laboratory) are listed below for reference.    Microbiology: Recent Results (from the past 240 hour(s))  Blood culture (routine x 2)  Status: None (Preliminary result)   Collection Time: 09/17/16 12:17 PM  Result Value Ref Range Status   Specimen Description BLOOD RIGHT ANTECUBITAL  Final   Special Requests BOTTLES DRAWN AEROBIC AND  ANAEROBIC  3CC  Final   Culture NO GROWTH 4 DAYS  Final   Report Status PENDING  Incomplete  Urine culture     Status: Abnormal   Collection Time: 09/17/16  1:11 PM  Result Value Ref Range Status   Specimen Description URINE, CLEAN CATCH  Final   Special Requests NONE  Final   Culture MULTIPLE SPECIES PRESENT, SUGGEST RECOLLECTION (A)  Final   Report Status 09/18/2016 FINAL  Final  Blood culture (routine x 2)     Status: None (Preliminary result)   Collection Time: 09/17/16  2:42 PM  Result Value Ref Range Status   Specimen Description BLOOD LEFT ANTECUBITAL  Final   Special Requests IN PEDIATRIC BOTTLE 2CC  Final   Culture NO GROWTH 4 DAYS  Final   Report Status PENDING  Incomplete  Urine culture     Status: Abnormal   Collection Time: 09/17/16  4:57 PM  Result Value Ref Range Status   Specimen Description URINE, RANDOM  Final   Special Requests NONE  Final   Culture MULTIPLE SPECIES PRESENT, SUGGEST RECOLLECTION (A)  Final   Report Status 09/19/2016 FINAL  Final     Labs: Basic Metabolic Panel:  Recent Labs Lab 09/18/16 0415 09/19/16 0326 09/20/16 0536 09/20/16 1955 09/21/16 0452  NA 143 149* 136 134* 132*  K 4.6 5.0 4.8 4.2 4.5  CL 117* 123* 112* 108 105  CO2 15* 20* 17* 20* 19*  GLUCOSE 94 141* 115* 116* 91  BUN 109* 55* 25* 18 15  CREATININE 1.65* 1.03* 0.88 0.88 0.81  CALCIUM 9.1 9.6 9.2 8.7* 8.4*   Liver Function Tests:  Recent Labs Lab 09/18/16 0415  AST 34  ALT 22  ALKPHOS 35*  BILITOT 0.6  PROT 6.3*  ALBUMIN 3.4*   CBC:  Recent Labs Lab 09/17/16 1114 09/17/16 1208 09/18/16 0415 09/19/16 0326 09/20/16 0536  WBC  --  11.1* 12.8* 10.9* 13.6*  HGB 13.9 13.3 10.4* 10.1* 9.2*  HCT 41.0 39.6 30.7* 30.4* 27.4*  MCV  --  97.1 96.2 96.5 97.2  PLT  --  206 177 171 184   CBG:  Recent Labs Lab 09/17/16 1025  GLUCAP 73     IMAGING STUDIES Dg Chest 2 View  Result Date: 09/17/2016 CLINICAL DATA:  Cough, weakness, forgetfulness, smoking  history EXAM: CHEST  2 VIEW COMPARISON:  Chest x-ray of 07/01/2016 FINDINGS: No active infiltrate or effusion is seen. The lungs remain somewhat hyperaerated. Mediastinal and hilar contours are unremarkable. The heart is within normal limits in size. There are degenerative changes throughout the thoracic spine and shoulders. IMPRESSION: Hyperaeration.  No active lung disease. Electronically Signed   By: Dwyane Dee M.D.   On: 09/17/2016 11:48    DISCHARGE EXAMINATION: Vitals:   09/20/16 2311 09/21/16 0450 09/21/16 1450 09/21/16 2144  BP: (!) 172/74 (!) 159/71 (!) 176/72 (!) 143/78  Pulse: 100 (!) 108 (!) 108 (!) 103  Resp:  18 17 18   Temp:  98.1 F (36.7 C) 98.7 F (37.1 C) 98.2 F (36.8 C)  TempSrc:   Oral Oral  SpO2:  100% 100% 100%  Weight:  58.3 kg (128 lb 8 oz)     General appearance: alert, cooperative, distracted and no distress Resp: clear to auscultation bilaterally Cardio: regular rate  and rhythm, S1, S2 normal, no murmur, click, rub or gallop GI: soft, non-tender; bowel sounds normal; no masses,  no organomegaly  DISPOSITION: Home with son  Discharge Instructions    Call MD for:  difficulty breathing, headache or visual disturbances    Complete by:  As directed    Call MD for:  extreme fatigue    Complete by:  As directed    Call MD for:  persistant dizziness or light-headedness    Complete by:  As directed    Call MD for:  persistant nausea and vomiting    Complete by:  As directed    Call MD for:  severe uncontrolled pain    Complete by:  As directed    Call MD for:  temperature >100.4    Complete by:  As directed    Discharge instructions    Complete by:  As directed    Need to see her outpatient provider in one week. Will need to have a uric acid level checked in the next week or 2 and discuss treatment with PCP.   You were cared for by a hospitalist during your hospital stay. If you have any questions about your discharge medications or the care you received  while you were in the hospital after you are discharged, you can call the unit and asked to speak with the hospitalist on call if the hospitalist that took care of you is not available. Once you are discharged, your primary care physician will handle any further medical issues. Please note that NO REFILLS for any discharge medications will be authorized once you are discharged, as it is imperative that you return to your primary care physician (or establish a relationship with a primary care physician if you do not have one) for your aftercare needs so that they can reassess your need for medications and monitor your lab values. If you do not have a primary care physician, you can call [phone] for a physician referral.   Increase activity slowly    Complete by:  As directed       ALLERGIES: No Known Allergies    Current Discharge Medication List    START taking these medications   Details  cefpodoxime (VANTIN) 200 MG tablet Take 1 tablet (200 mg total) by mouth every 12 (twelve) hours. Qty: 10 tablet, Refills: 0    predniSONE (DELTASONE) 20 MG tablet Take 1 tablet (20 mg total) by mouth daily with breakfast. Qty: 5 tablet, Refills: 0      CONTINUE these medications which have NOT CHANGED   Details  carvedilol (COREG) 3.125 MG tablet Take 1 tablet (3.125 mg total) by mouth 2 (two) times daily with a meal. Qty: 60 tablet, Refills: 2    feeding supplement, ENSURE ENLIVE, (ENSURE ENLIVE) LIQD Take 237 mLs by mouth 3 (three) times daily between meals. Qty: 237 mL, Refills: 12    lisinopril (PRINIVIL,ZESTRIL) 2.5 MG tablet Take 2.5 mg by mouth daily.    pantoprazole (PROTONIX) 40 MG tablet Take 40 mg by mouth daily.    sertraline (ZOLOFT) 50 MG tablet Take 50 mg by mouth daily.    traZODone (DESYREL) 50 MG tablet Take 50 mg by mouth at bedtime as needed for sleep.    aspirin 81 MG chewable tablet Chew 1 tablet (81 mg total) by mouth daily. Qty: 30 tablet, Refills: 0      senna-docusate (SENOKOT-S) 8.6-50 MG tablet Take 2 tablets by mouth at bedtime as needed for moderate constipation.  Follow-up Information    WHITE,CYNTHIA S, MD. Schedule an appointment as soon as possible for a visit on 09/30/2016.   Specialty:  Family Medicine Why:  Appointment is on 09/30/16 at 10am Contact information: [address] [place] [address] [phone]        [facility].   Why:  For home health services. They will call in next 1-2 days to set up home health services. Contact information: [address] [place] [address] [phone]           TOTAL DISCHARGE TIME: 35 minutes  [facility] Hospital[facility]ISHNAN,Maison Agrusa  [facility] Pager [phone]  09/22/2016, 2:34 PM

## 2016-09-22 NOTE — Discharge Instructions (Signed)
Acute Kidney Injury, Adult °Acute kidney injury (AKI) occurs when there is sudden (acute) damage to the kidneys. A small amount of kidney damage may not cause problems, but a large amount of damage may make it difficult or impossible for the kidneys to work the way they should. AKI may develop into long-lasting (chronic) kidney disease. Early detection and treatment of AKI may prevent kidney damage from becoming permanent or getting worse. °What are the causes? °Common causes of this condition include: °· A problem with blood flow to the kidneys. This may be caused by: °¨ Blood loss. °¨ Heart and blood vessel (cardiovascular) disease. °¨ Severe burns. °¨ Liver disease. °· Direct damage to the kidneys. This may be caused by: °¨ Some medicines. °¨ A kidney infection. °¨ Poisoning. °¨ Being around or in contact with poisonous (toxic) substances. °¨ A surgical wound. °¨ A hard, direct force to the kidney area. °· A sudden blockage of urine flow. This may be caused by: °¨ Cancer. °¨ Kidney stones. °¨ Enlarged prostate in males. °What are the signs or symptoms? °Symptoms develop slowly and may not be obvious until the kidney damage becomes severe. It is possible to have AKI for years without showing any symptoms. Symptoms of this condition can include: °· Swelling (edema) of the face, legs, ankles, or feet. °· Numbness, tingling, or loss of feeling (sensation) in the hands or feet. °· Tiredness (lethargy). °· Nausea or vomiting. °· Confusion or trouble concentrating. °· Problems with urination, such as: °¨ Painful or burning feeling during urination. °¨ Decreased urine production. °¨ Frequent urination, especially at night. °¨ Bloody urine. °· Muscle twitches and cramps, especially in the legs. °· Shortness of breath. °· Weakness. °· Constant itchiness. °· Loss of appetite. °· Metallic taste in the mouth. °· Trouble sleeping. °· Pale lining of the eyelids and surface of the eye (conjunctiva). °How is this diagnosed? °This  condition may be diagnosed with various tests. Tests may include: °· Blood tests. °· Urine tests. °· Imaging tests. °· A test in which a sample of tissue is removed from the kidneys to be looked at under a microscope (kidney biopsy). °How is this treated? °Treatment of AKI varies depending on the cause and severity of the kidney damage. In mild cases, treatment may not be needed. The kidneys may heal on their own. °If AKI is more severe, your health care provider will treat the cause of the kidney damage, help the kidneys heal, and prevent problems from occurring. Severe cases may require a procedure to remove toxic wastes from the body (dialysis) or surgery to repair kidney damage. Surgery may involve: °· Repair of a torn kidney. °· Removal of a urine flow obstruction. °Follow these instructions at home: °· Follow your prescribed diet. °· Take over-the-counter and prescription medicines only as told by your health care provider. °¨ Do not take any new medicines unless approved by your health care provider. Many medicines can worsen your kidney damage. °¨ Do not take any vitamin and mineral supplements unless approved by your health care provider. Many nutritional supplements can worsen your kidney damage. °¨ The dose of some medicines that you take may need to be adjusted. °· Do not use any tobacco products, such as cigarettes, chewing tobacco, and e-cigarettes. If you need help quitting, ask your health care provider. °· Keep all follow-up visits as told by your health care provider. This is important. °· Keep track of your blood pressure. Report changes in your blood pressure as told by your   health care provider. °· Achieve and maintain a healthy weight. If you need help with this, ask your health care provider. °· Start or continue an exercise plan. Try to exercise at least 30 minutes a day, 5 days a week. °· Stay current with immunizations as told by your health care provider. °Where to find more  information: °· American Association of Kidney Patients: www.aakp.org °· National Kidney Foundation: www.kidney.org °· American Kidney Fund: www.akfinc.org °· [employer] [facility]: www.lifeoptions.org and www.kidneyschool.org °Contact a health care provider if: °· Your symptoms get worse. °· You develop new symptoms. °Get help right away if: °· You develop symptoms of end-stage kidney disease, which include: °¨ Headaches. °¨ Abnormally dark or light skin. °¨ Numbness in the hands or feet. °¨ Easy bruising. °¨ Frequent hiccups. °¨ Chest pain. °¨ Shortness of breath. °¨ End of menstruation in women. °· You have a fever. °· You have decreased urine production. °· You have pain or bleeding when you urinate. °This information is not intended to replace advice given to you by your health care provider. Make sure you discuss any questions you have with your health care provider. °Document Released: 04/07/2011 Document Revised: 05/01/2016 Document Reviewed: 05/21/2012 °Elsevier Interactive Patient Education © 2017 [employer]. ° ° °

## 2016-09-22 NOTE — Progress Notes (Signed)
NURSING PROGRESS NOTE  Tammy [name] [id number] Discharge Data: 09/22/2016 3:02 PM Attending Provider: Osvaldo ShipperGokul Krishnan, MD WUJ:WJXBJ,YNWGNFAPCP:WHITE,CYNTHIA S, MD     Tammy [name] to be D/C'd Home per MD order.  Discussed with the patient's son the After Visit Summary and all questions fully answered. All IV's discontinued with no bleeding noted. All belongings returned to patient for patient to take home.   Last Vital Signs:  Blood pressure (!) 143/78, pulse (!) 103, temperature 98.2 F (36.8 C), temperature source Oral, resp. rate 18, weight 58.3 kg (128 lb 8 oz), SpO2 100 %.  Discharge Medication List Allergies as of 09/22/2016   No Known Allergies     Medication List    TAKE these medications   aspirin 81 MG chewable tablet Chew 1 tablet (81 mg total) by mouth daily.   carvedilol 3.125 MG tablet Commonly known as:  COREG Take 1 tablet (3.125 mg total) by mouth 2 (two) times daily with a meal.   cefpodoxime 200 MG tablet Commonly known as:  VANTIN Take 1 tablet (200 mg total) by mouth every 12 (twelve) hours.   feeding supplement (ENSURE ENLIVE) Liqd Take 237 mLs by mouth 3 (three) times daily between meals.   lisinopril 2.5 MG tablet Commonly known as:  PRINIVIL,ZESTRIL Take 2.5 mg by mouth daily.   pantoprazole 40 MG tablet Commonly known as:  PROTONIX Take 40 mg by mouth daily.   predniSONE 20 MG tablet Commonly known as:  DELTASONE Take 1 tablet (20 mg total) by mouth daily with breakfast.   senna-docusate 8.6-50 MG tablet Commonly known as:  Senokot-S Take 2 tablets by mouth at bedtime as needed for moderate constipation.   sertraline 50 MG tablet Commonly known as:  ZOLOFT Take 50 mg by mouth daily.   traZODone 50 MG tablet Commonly known as:  DESYREL Take 50 mg by mouth at bedtime as needed for sleep.

## 2016-10-02 ENCOUNTER — Ambulatory Visit: Payer: Medicare Other | Admitting: Podiatry

## 2016-10-09 ENCOUNTER — Telehealth: Payer: Self-pay | Admitting: *Deleted

## 2016-10-09 ENCOUNTER — Ambulatory Visit (INDEPENDENT_AMBULATORY_CARE_PROVIDER_SITE_OTHER): Payer: Medicare Other | Admitting: Podiatry

## 2016-10-09 ENCOUNTER — Encounter: Payer: Self-pay | Admitting: Podiatry

## 2016-10-09 VITALS — BP 119/63 | HR 88 | Resp 14

## 2016-10-09 DIAGNOSIS — M79676 Pain in unspecified toe(s): Secondary | ICD-10-CM | POA: Diagnosis not present

## 2016-10-09 DIAGNOSIS — B351 Tinea unguium: Secondary | ICD-10-CM

## 2016-10-09 DIAGNOSIS — L97909 Non-pressure chronic ulcer of unspecified part of unspecified lower leg with unspecified severity: Secondary | ICD-10-CM | POA: Diagnosis not present

## 2016-10-09 DIAGNOSIS — I739 Peripheral vascular disease, unspecified: Secondary | ICD-10-CM | POA: Diagnosis not present

## 2016-10-09 DIAGNOSIS — R0989 Other specified symptoms and signs involving the circulatory and respiratory systems: Secondary | ICD-10-CM

## 2016-10-09 NOTE — Progress Notes (Signed)
   Subjective:    Patient ID: Tammy [name], female    DOB: Aug 13, 1941, 76 y.o.   MRN: [id number]  HPI this patient presents to the office today for evaluation of both of her feet. [facility] and they  then referred her to this office. She also has a history of UTIs and potassium imbalance which has  caused hospitalization.  This patient has dementia and has been diagnosed with confusion. She  gives a history of smoking 2 packs a day of cigarettes. She presents the office today stating that her right great toenail has popped off recently and that the fourth toe nail on the right foot has popped off previously. She presents the office not wearing a bandage at these sites also. Examination of her left foot reveals black skin necrosis noted at the distal aspect of the second and fifth toes of the left foot. Finally, there is dramatic peeling through the forefoot skin on the right foot. She presents the office today for an evaluation and treatment of this condition.    Review of Systems  All other systems reviewed and are negative.      Objective:   Physical Exam GENERAL APPEARANCE: Alert but confused and testy. VASCULAR: Pedal pulses are not   [facility].  Capillary refill time is diminished.    Cold feet noted.  Digital hair growth is present bilateral  NEUROLOGIC: deferred at this time.,  MUSCULOSKELETAL: acceptable muscle strength, tone and stability bilateral.  Intrinsic muscluature intact bilateral.  Rectus appearance of foot and digits noted bilateral.   DERMATOLOGIC: Thin shiny skin noted with no evidence of infection or ulcers to her feet. Desquamation noted right forefoot. Skin necrosis noted at distal aspect 2,5 toes left foot.   Examination of her nails reveal the 1,2 and 4 toenails of the right foot are avulsed at the level of the nail bed.  Her fourth toenail is covered by crust with no signs of drainage or  infection.  Her second toe avulsed as I worked on her nails.  Her great toe has red granular tissue in the absence of redness or infection.  Thick disfigured discolored nails both feet except for previously discribed nail avulsions.         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  Onychomycosis  IE  Debridement of nails both feet. Debridement of necrotic skin 1,2 toes right foot.  Neosporin/DSD.  Discussed her feet with her son told him that she needs to soak and bandaged her first and second toes, right foot. I told him there is no evidence today of any infection. I told him he needs to take her for a vascular consult. I believe these skin lesions will not heal until the circulation is addressed contacted Vikki PortsValerie to ask her to make the referral for this patient. If this condition worsens or becomes very painful, the patient was told to contact this office or go to the Emergency Department at the hospital.  Helane GuntherGregory Mayer DPM

## 2016-10-09 NOTE — Telephone Encounter (Signed)
Tammy [name] - [facility] states Dr. Stacie AcresMayer has pt he would like vascular consult for. Dr. Stacie AcresMayer states pt smokes two packs of cigarettes/day, has 3 ulcers to right foot from toenail lose due to decreased circulation. Dr. Stacie AcresMayer states have referral facility contact pt's son Tammy [name] [phone]. [facility]

## 2016-11-13 ENCOUNTER — Ambulatory Visit: Payer: Medicare Other | Admitting: Cardiology

## 2016-11-25 ENCOUNTER — Ambulatory Visit: Payer: Medicare Other | Admitting: Cardiovascular Disease

## 2016-12-02 ENCOUNTER — Ambulatory Visit (INDEPENDENT_AMBULATORY_CARE_PROVIDER_SITE_OTHER): Payer: Medicare Other | Admitting: Cardiovascular Disease

## 2016-12-02 ENCOUNTER — Encounter: Payer: Self-pay | Admitting: Cardiovascular Disease

## 2016-12-02 VITALS — BP 150/90 | HR 116 | Ht 65.5 in | Wt 123.4 lb

## 2016-12-02 DIAGNOSIS — M79604 Pain in right leg: Secondary | ICD-10-CM | POA: Diagnosis not present

## 2016-12-02 DIAGNOSIS — S91309A Unspecified open wound, unspecified foot, initial encounter: Secondary | ICD-10-CM | POA: Diagnosis not present

## 2016-12-02 DIAGNOSIS — M79605 Pain in left leg: Secondary | ICD-10-CM | POA: Diagnosis not present

## 2016-12-02 DIAGNOSIS — Z720 Tobacco use: Secondary | ICD-10-CM | POA: Diagnosis not present

## 2016-12-02 NOTE — Patient Instructions (Signed)
Medication Instructions:  Your physician recommends that you continue on your current medications as directed. Please refer to the Current Medication list given to you today.  Labwork: No new orders.   Testing/Procedures: Your physician has requested that you have a lower extremity arterial doppler. This test is an ultrasound of the arteries in the legs. It looks at arterial blood flow in the legs. Allow one hour for Lower Arterial scans. There are no restrictions or special instructions  Follow-Up: You have been referred to the [facility].  Your physician recommends that you schedule a follow-up appointment as needed with Dr Kirke CorinArida based on arterial doppler findings.     Any Other Special Instructions Will Be Listed Below (If Applicable).     If you need a refill on your cardiac medications before your next appointment, please call your pharmacy.

## 2016-12-02 NOTE — Progress Notes (Signed)
Cardiology Office Note   Date:  12/02/2016   ID:  Tammy [name], DOB 1941-04-10, MRN [id number]  PCP:  Cala BradfordWHITE,CYNTHIA S, MD  Cardiologist:  New   Chief Complaint  Patient presents with  . New Patient (Initial Visit)    no circulation in toes      History of Present Illness: Tammy [name] is a 76 y.o. female who was referred by Dr. Stacie AcresMayer for evaluation of peripheral arterial disease. The patient has history of recurrent UTIs, hypertension, dementia, stroke and tobacco use. She had a stroke in 2016. Echocardiogram at that time showed an EF of 30%. She was seen by Dr.Bensimhon at that time who recommended medical therapy given the lack of symptoms and recent stroke. She underwent a repeat echocardiogram a month later which showed an EF of 40-45%. The patient is overall a poor historian. She developed onchomycosis with avulsion of some toe nails. She complains of bilateral cold feet and some discomfort. Her physical mobility is somewhat limited by shortness of breath and poor balance. She denies any chest pain. She lives by herself but is dependent on her son for activities of daily living. She has been smoking for more than 50 years. There is no history of diabetes. Family history is negative for coronary artery disease or peripheral arterial disease.    Past Medical History:  Diagnosis Date  . Hypertension   . Insomnia     Past Surgical History:  Procedure Laterality Date  . INTRAMEDULLARY (IM) NAIL INTERTROCHANTERIC Left 08/19/2015   Procedure: INTRAMEDULLARY (IM) NAIL INTERTROCHANTRIC;  Surgeon: Jodi GeraldsJohn Graves, MD;  Location: [facility] OR;  Service: Orthopedics;  Laterality: Left;     Current Outpatient Prescriptions  Medication Sig Dispense Refill  . aspirin 81 MG chewable tablet Chew 1 tablet (81 mg total) by mouth daily. 30 tablet 0  . carvedilol (COREG) 3.125 MG tablet Take 1 tablet (3.125 mg total) by mouth 2 (two) times daily with a meal. 60 tablet 2  . feeding  supplement, ENSURE ENLIVE, (ENSURE ENLIVE) LIQD Take 237 mLs by mouth 3 (three) times daily between meals. 237 mL 12  . HYDROcodone-acetaminophen (NORCO/VICODIN) 5-325 MG tablet Take 1 tablet by mouth as directed.     Marland Kitchen. lisinopril (PRINIVIL,ZESTRIL) 2.5 MG tablet Take 2.5 mg by mouth daily.    . pantoprazole (PROTONIX) 40 MG tablet Take 40 mg by mouth daily.    Marland Kitchen. senna-docusate (SENOKOT-S) 8.6-50 MG tablet Take 2 tablets by mouth at bedtime as needed for moderate constipation.    . sertraline (ZOLOFT) 50 MG tablet Take 50 mg by mouth daily.    . traZODone (DESYREL) 50 MG tablet Take 50 mg by mouth at bedtime as needed for sleep.     No current facility-administered medications for this visit.     Allergies:   Patient has no known allergies.    Social History:  The patient  reports that she has been smoking Cigarettes.  She has a 100.00 pack-year smoking history. She has never used smokeless tobacco. She reports that she does not drink alcohol or use drugs.   Family History:  The patient's family history includes Congestive Heart Failure (age of onset: 4555) in her son.    ROS:  Please see the history of present illness.   Otherwise, review of systems are positive for none.   All other systems are reviewed and negative.    PHYSICAL EXAM: VS:  BP (!) 150/90   Pulse (!) 116   Ht 5'  5.5" (1.664 m)   Wt 123 lb 6.4 oz (56 kg)   BMI 20.22 kg/m  , BMI Body mass index is 20.22 kg/m. GEN: Well nourished, well developed, in no acute distress  HEENT: normal  Neck: no JVD, carotid bruits, or masses Cardiac: RRR; no murmurs, rubs, or gallops,no edema  Respiratory:  clear to auscultation bilaterally, normal work of breathing GI: soft, nontender, nondistended, + BS MS: no deformity or atrophy  Skin: warm and dry, no rash Neuro:  Strength and sensation are intact Psych: euthymic mood, full affect Vascular: Femoral pulses are normal bilaterally. Dorsalis pedis is absent on the right and +2 on the  left. Posterior tibial is +2 on the right and absent on the left. There is small necrotic tissue on the left small toe and superficial ulceration at the dorsal base of the right big toe and minor ulceration at the second and fourth toe.   EKG:  EKG is not ordered today.   Recent Labs: 07/01/2016: B Natriuretic Peptide 145.8 09/18/2016: ALT 22 09/20/2016: Hemoglobin 9.2; Platelets 184 09/21/2016: BUN 15; Creatinine, Ser 0.81; Potassium 4.5; Sodium 132    Lipid Panel    Component Value Date/Time   CHOL 120 03/01/2015 0218   TRIG 60 03/01/2015 0218   HDL 47 03/01/2015 0218   CHOLHDL 2.6 03/01/2015 0218   VLDL 12 03/01/2015 0218   LDLCALC 61 03/01/2015 0218      Wt Readings from Last 3 Encounters:  12/02/16 123 lb 6.4 oz (56 kg)  09/21/16 128 lb 8 oz (58.3 kg)  07/03/16 119 lb (54 kg)       PAD Screen 12/02/2016  Previous PAD dx? No  Previous surgical procedure? No  Pain with walking? No  Feet/toe relief with dangling? No  Painful, non-healing ulcers? Yes  Extremities discolored? No      ASSESSMENT AND PLAN:  1.  Minor bilateral toe ulceration: She certainly at high risk for peripheral arterial disease given prolonged history of tobacco use. However, her distal pulses are palpable. I requested lower extremity arterial Doppler for evaluation. She might have tibial disease. The patient has not been following any wound care instructions and overall foot hygiene appears to be poor.  I'm referring her to the wound clinic for help with this.   2. Tobacco use: Smoking cessation was discussed with the patient tends to underestimate, she smokes. She does have mild dementia as well which makes it harder for her to follow instructions.   Disposition:   FU with me as needed.   Signed,  Lorine Bears, MD  12/02/2016 10:42 AM     [facility]

## 2016-12-11 ENCOUNTER — Other Ambulatory Visit: Payer: Self-pay | Admitting: Cardiovascular Disease

## 2016-12-11 DIAGNOSIS — IMO0002 Reserved for concepts with insufficient information to code with codable children: Secondary | ICD-10-CM

## 2016-12-17 ENCOUNTER — Inpatient Hospital Stay (HOSPITAL_COMMUNITY): Admission: RE | Admit: 2016-12-17 | Payer: Medicare Other | Source: Ambulatory Visit

## 2017-01-16 ENCOUNTER — Ambulatory Visit (HOSPITAL_COMMUNITY)
Admission: RE | Admit: 2017-01-16 | Discharge: 2017-01-16 | Disposition: A | Discharge status: Home / Self Care | Payer: Medicare Other | Source: Ambulatory Visit | Attending: Cardiology | Admitting: Cardiology

## 2017-01-16 ENCOUNTER — Other Ambulatory Visit: Payer: Self-pay | Admitting: Cardiovascular Disease

## 2017-01-16 DIAGNOSIS — S91309A Unspecified open wound, unspecified foot, initial encounter: Secondary | ICD-10-CM

## 2017-01-16 DIAGNOSIS — M79604 Pain in right leg: Secondary | ICD-10-CM | POA: Insufficient documentation

## 2017-01-16 DIAGNOSIS — X58XXXA Exposure to other specified factors, initial encounter: Secondary | ICD-10-CM | POA: Diagnosis not present

## 2017-01-16 DIAGNOSIS — L98499 Non-pressure chronic ulcer of skin of other sites with unspecified severity: Secondary | ICD-10-CM | POA: Diagnosis not present

## 2017-01-16 DIAGNOSIS — M79605 Pain in left leg: Secondary | ICD-10-CM | POA: Diagnosis present

## 2017-01-16 DIAGNOSIS — IMO0002 Reserved for concepts with insufficient information to code with codable children: Secondary | ICD-10-CM

## 2017-01-22 NOTE — Telephone Encounter (Signed)
This encounter was created in error - please disregard.

## 2017-03-19 ENCOUNTER — Encounter (HOSPITAL_BASED_OUTPATIENT_CLINIC_OR_DEPARTMENT_OTHER): Payer: Medicare Other | Attending: Internal Medicine

## 2017-03-19 DIAGNOSIS — M86371 Chronic multifocal osteomyelitis, right ankle and foot: Secondary | ICD-10-CM | POA: Insufficient documentation

## 2017-03-19 DIAGNOSIS — I70235 Atherosclerosis of native arteries of right leg with ulceration of other part of foot: Secondary | ICD-10-CM | POA: Diagnosis present

## 2017-03-19 DIAGNOSIS — I110 Hypertensive heart disease with heart failure: Secondary | ICD-10-CM | POA: Diagnosis not present

## 2017-03-19 DIAGNOSIS — F17210 Nicotine dependence, cigarettes, uncomplicated: Secondary | ICD-10-CM | POA: Insufficient documentation

## 2017-03-19 DIAGNOSIS — A4901 Methicillin susceptible Staphylococcus aureus infection, unspecified site: Secondary | ICD-10-CM | POA: Insufficient documentation

## 2017-03-19 DIAGNOSIS — L97514 Non-pressure chronic ulcer of other part of right foot with necrosis of bone: Secondary | ICD-10-CM | POA: Diagnosis not present

## 2017-03-19 DIAGNOSIS — J449 Chronic obstructive pulmonary disease, unspecified: Secondary | ICD-10-CM | POA: Insufficient documentation

## 2017-03-19 DIAGNOSIS — I69319 Unspecified symptoms and signs involving cognitive functions following cerebral infarction: Secondary | ICD-10-CM | POA: Diagnosis not present

## 2017-03-19 DIAGNOSIS — F0390 Unspecified dementia without behavioral disturbance: Secondary | ICD-10-CM | POA: Insufficient documentation

## 2017-03-19 DIAGNOSIS — I509 Heart failure, unspecified: Secondary | ICD-10-CM | POA: Insufficient documentation

## 2017-03-25 ENCOUNTER — Other Ambulatory Visit: Payer: Self-pay | Admitting: Internal Medicine

## 2017-03-25 ENCOUNTER — Ambulatory Visit (HOSPITAL_COMMUNITY)
Admission: RE | Admit: 2017-03-25 | Discharge: 2017-03-25 | Disposition: A | Discharge status: Home / Self Care | Payer: Medicare Other | Source: Ambulatory Visit | Attending: Internal Medicine | Admitting: Internal Medicine

## 2017-03-25 DIAGNOSIS — M86171 Other acute osteomyelitis, right ankle and foot: Secondary | ICD-10-CM | POA: Diagnosis present

## 2017-03-27 ENCOUNTER — Other Ambulatory Visit (HOSPITAL_COMMUNITY)
Admission: RE | Admit: 2017-03-27 | Discharge: 2017-03-27 | Disposition: A | Discharge status: Home / Self Care | Payer: Medicare Other | Source: Other Acute Inpatient Hospital | Attending: Internal Medicine | Admitting: Internal Medicine

## 2017-03-27 DIAGNOSIS — I70235 Atherosclerosis of native arteries of right leg with ulceration of other part of foot: Secondary | ICD-10-CM | POA: Diagnosis not present

## 2017-03-27 DIAGNOSIS — L97519 Non-pressure chronic ulcer of other part of right foot with unspecified severity: Secondary | ICD-10-CM | POA: Insufficient documentation

## 2017-03-30 LAB — AEROBIC CULTURE W GRAM STAIN (SUPERFICIAL SPECIMEN)

## 2017-04-03 DIAGNOSIS — I70235 Atherosclerosis of native arteries of right leg with ulceration of other part of foot: Secondary | ICD-10-CM | POA: Diagnosis not present

## 2017-04-10 ENCOUNTER — Encounter (HOSPITAL_BASED_OUTPATIENT_CLINIC_OR_DEPARTMENT_OTHER): Payer: Medicare Other | Attending: Internal Medicine

## 2017-04-10 DIAGNOSIS — I509 Heart failure, unspecified: Secondary | ICD-10-CM | POA: Insufficient documentation

## 2017-04-10 DIAGNOSIS — I70235 Atherosclerosis of native arteries of right leg with ulceration of other part of foot: Secondary | ICD-10-CM | POA: Insufficient documentation

## 2017-04-10 DIAGNOSIS — J449 Chronic obstructive pulmonary disease, unspecified: Secondary | ICD-10-CM | POA: Insufficient documentation

## 2017-04-10 DIAGNOSIS — F0390 Unspecified dementia without behavioral disturbance: Secondary | ICD-10-CM | POA: Diagnosis not present

## 2017-04-10 DIAGNOSIS — M86371 Chronic multifocal osteomyelitis, right ankle and foot: Secondary | ICD-10-CM | POA: Diagnosis not present

## 2017-04-10 DIAGNOSIS — I110 Hypertensive heart disease with heart failure: Secondary | ICD-10-CM | POA: Diagnosis not present

## 2017-04-10 DIAGNOSIS — L97512 Non-pressure chronic ulcer of other part of right foot with fat layer exposed: Secondary | ICD-10-CM | POA: Diagnosis not present

## 2017-04-17 DIAGNOSIS — I70235 Atherosclerosis of native arteries of right leg with ulceration of other part of foot: Secondary | ICD-10-CM | POA: Diagnosis not present

## 2017-04-24 DIAGNOSIS — I70235 Atherosclerosis of native arteries of right leg with ulceration of other part of foot: Secondary | ICD-10-CM | POA: Diagnosis not present

## 2017-05-01 DIAGNOSIS — I70235 Atherosclerosis of native arteries of right leg with ulceration of other part of foot: Secondary | ICD-10-CM | POA: Diagnosis not present

## 2017-05-22 ENCOUNTER — Encounter (HOSPITAL_BASED_OUTPATIENT_CLINIC_OR_DEPARTMENT_OTHER): Payer: Medicare Other | Attending: Internal Medicine

## 2017-05-22 DIAGNOSIS — F0390 Unspecified dementia without behavioral disturbance: Secondary | ICD-10-CM | POA: Diagnosis not present

## 2017-05-22 DIAGNOSIS — L97519 Non-pressure chronic ulcer of other part of right foot with unspecified severity: Secondary | ICD-10-CM | POA: Insufficient documentation

## 2017-05-22 DIAGNOSIS — I70235 Atherosclerosis of native arteries of right leg with ulceration of other part of foot: Secondary | ICD-10-CM | POA: Diagnosis present

## 2017-05-22 DIAGNOSIS — I10 Essential (primary) hypertension: Secondary | ICD-10-CM | POA: Diagnosis not present

## 2017-05-22 DIAGNOSIS — J449 Chronic obstructive pulmonary disease, unspecified: Secondary | ICD-10-CM | POA: Insufficient documentation

## 2017-06-12 ENCOUNTER — Encounter (HOSPITAL_BASED_OUTPATIENT_CLINIC_OR_DEPARTMENT_OTHER): Payer: Medicare Other | Attending: Internal Medicine

## 2017-06-12 DIAGNOSIS — F0390 Unspecified dementia without behavioral disturbance: Secondary | ICD-10-CM | POA: Diagnosis not present

## 2017-06-12 DIAGNOSIS — L97514 Non-pressure chronic ulcer of other part of right foot with necrosis of bone: Secondary | ICD-10-CM | POA: Insufficient documentation

## 2017-06-12 DIAGNOSIS — I110 Hypertensive heart disease with heart failure: Secondary | ICD-10-CM | POA: Insufficient documentation

## 2017-06-12 DIAGNOSIS — I509 Heart failure, unspecified: Secondary | ICD-10-CM | POA: Diagnosis not present

## 2017-06-12 DIAGNOSIS — J449 Chronic obstructive pulmonary disease, unspecified: Secondary | ICD-10-CM | POA: Diagnosis not present

## 2017-06-12 DIAGNOSIS — I70235 Atherosclerosis of native arteries of right leg with ulceration of other part of foot: Secondary | ICD-10-CM | POA: Diagnosis present

## 2017-10-30 ENCOUNTER — Encounter (HOSPITAL_COMMUNITY): Payer: Self-pay

## 2017-10-30 ENCOUNTER — Emergency Department (HOSPITAL_COMMUNITY)
Admission: EM | Admit: 2017-10-30 | Discharge: 2017-10-30 | Disposition: A | Discharge status: Home / Self Care | Payer: Medicare Other | Attending: Emergency Medicine | Admitting: Emergency Medicine

## 2017-10-30 DIAGNOSIS — Z5321 Procedure and treatment not carried out due to patient leaving prior to being seen by health care provider: Secondary | ICD-10-CM | POA: Insufficient documentation

## 2017-10-30 LAB — CBC WITH DIFFERENTIAL/PLATELET
Basophils Absolute: 0 10*3/uL (ref 0.0–0.1)
Basophils Relative: 1 %
Eosinophils Absolute: 0.1 10*3/uL (ref 0.0–0.7)
Eosinophils Relative: 1 %
HCT: 44 % (ref 36.0–46.0)
Hemoglobin: 14.2 g/dL (ref 12.0–15.0)
Lymphocytes Relative: 22 %
Lymphs Abs: 1.3 10*3/uL (ref 0.7–4.0)
MCH: 32.1 pg (ref 26.0–34.0)
MCHC: 32.3 g/dL (ref 30.0–36.0)
MCV: 99.5 fL (ref 78.0–100.0)
Monocytes Absolute: 0.7 10*3/uL (ref 0.1–1.0)
Monocytes Relative: 12 %
Neutro Abs: 3.8 10*3/uL (ref 1.7–7.7)
Neutrophils Relative %: 64 %
Platelets: 233 10*3/uL (ref 150–400)
RBC: 4.42 MIL/uL (ref 3.87–5.11)
RDW: 16.4 % — ABNORMAL HIGH (ref 11.5–15.5)
WBC: 6 10*3/uL (ref 4.0–10.5)

## 2017-10-30 LAB — BASIC METABOLIC PANEL
Anion gap: 14 (ref 5–15)
BUN: 33 mg/dL — ABNORMAL HIGH (ref 6–20)
CO2: 23 mmol/L (ref 22–32)
Calcium: 9.5 mg/dL (ref 8.9–10.3)
Chloride: 108 mmol/L (ref 101–111)
Creatinine, Ser: 1.59 mg/dL — ABNORMAL HIGH (ref 0.44–1.00)
GFR calc Af Amer: 35 mL/min — ABNORMAL LOW (ref >60)
GFR calc non Af Amer: 30 mL/min — ABNORMAL LOW (ref >60)
Glucose, Bld: 116 mg/dL — ABNORMAL HIGH (ref 65–99)
Potassium: 5.1 mmol/L (ref 3.5–5.1)
Sodium: 145 mmol/L (ref 135–145)

## 2017-10-30 NOTE — ED Triage Notes (Signed)
Pt brought in by son because she "is not feeling good" for the past several days, pt reports that nothing is wrong with her and she feels fine. Son reports that she weak all over, pt denies and wants to go home and her son just wants to put her in a nursing home

## 2017-10-30 NOTE — ED Notes (Signed)
Visitor states they are leaving due to wait.  Encouraged for pt to stay and family states he is going to take pt home and call pt's doctor.

## 2017-11-10 ENCOUNTER — Encounter (HOSPITAL_BASED_OUTPATIENT_CLINIC_OR_DEPARTMENT_OTHER): Payer: Medicare Other | Attending: Internal Medicine

## 2017-11-10 ENCOUNTER — Other Ambulatory Visit: Payer: Self-pay | Admitting: Internal Medicine

## 2017-11-10 DIAGNOSIS — Z8673 Personal history of transient ischemic attack (TIA), and cerebral infarction without residual deficits: Secondary | ICD-10-CM | POA: Diagnosis not present

## 2017-11-10 DIAGNOSIS — L97512 Non-pressure chronic ulcer of other part of right foot with fat layer exposed: Secondary | ICD-10-CM | POA: Insufficient documentation

## 2017-11-10 DIAGNOSIS — F17210 Nicotine dependence, cigarettes, uncomplicated: Secondary | ICD-10-CM | POA: Insufficient documentation

## 2017-11-10 DIAGNOSIS — J449 Chronic obstructive pulmonary disease, unspecified: Secondary | ICD-10-CM | POA: Insufficient documentation

## 2017-11-10 DIAGNOSIS — I10 Essential (primary) hypertension: Secondary | ICD-10-CM | POA: Diagnosis not present

## 2017-11-10 DIAGNOSIS — L97519 Non-pressure chronic ulcer of other part of right foot with unspecified severity: Secondary | ICD-10-CM

## 2017-11-10 DIAGNOSIS — F0390 Unspecified dementia without behavioral disturbance: Secondary | ICD-10-CM | POA: Diagnosis not present

## 2017-12-10 ENCOUNTER — Encounter (HOSPITAL_BASED_OUTPATIENT_CLINIC_OR_DEPARTMENT_OTHER): Payer: Medicare Other | Attending: Internal Medicine

## 2018-03-29 ENCOUNTER — Emergency Department (HOSPITAL_COMMUNITY): Payer: Medicare Other

## 2018-03-29 ENCOUNTER — Encounter (HOSPITAL_COMMUNITY): Payer: Self-pay | Admitting: Emergency Medicine

## 2018-03-29 ENCOUNTER — Inpatient Hospital Stay (HOSPITAL_COMMUNITY)
Admission: EM | Admit: 2018-03-29 | Discharge: 2018-04-02 | DRG: 291 | Disposition: A | Discharge status: Home Health | Payer: Medicare Other | Attending: Family Medicine | Admitting: Family Medicine

## 2018-03-29 ENCOUNTER — Other Ambulatory Visit: Payer: Self-pay

## 2018-03-29 DIAGNOSIS — Z7982 Long term (current) use of aspirin: Secondary | ICD-10-CM | POA: Diagnosis not present

## 2018-03-29 DIAGNOSIS — I5043 Acute on chronic combined systolic (congestive) and diastolic (congestive) heart failure: Secondary | ICD-10-CM | POA: Diagnosis present

## 2018-03-29 DIAGNOSIS — I509 Heart failure, unspecified: Secondary | ICD-10-CM | POA: Diagnosis not present

## 2018-03-29 DIAGNOSIS — R64 Cachexia: Secondary | ICD-10-CM | POA: Diagnosis present

## 2018-03-29 DIAGNOSIS — Z79899 Other long term (current) drug therapy: Secondary | ICD-10-CM

## 2018-03-29 DIAGNOSIS — N183 Chronic kidney disease, stage 3 (moderate): Secondary | ICD-10-CM | POA: Diagnosis present

## 2018-03-29 DIAGNOSIS — Z8673 Personal history of transient ischemic attack (TIA), and cerebral infarction without residual deficits: Secondary | ICD-10-CM

## 2018-03-29 DIAGNOSIS — I340 Nonrheumatic mitral (valve) insufficiency: Secondary | ICD-10-CM | POA: Diagnosis present

## 2018-03-29 DIAGNOSIS — I429 Cardiomyopathy, unspecified: Secondary | ICD-10-CM | POA: Diagnosis present

## 2018-03-29 DIAGNOSIS — N182 Chronic kidney disease, stage 2 (mild): Secondary | ICD-10-CM | POA: Diagnosis not present

## 2018-03-29 DIAGNOSIS — I739 Peripheral vascular disease, unspecified: Secondary | ICD-10-CM | POA: Diagnosis not present

## 2018-03-29 DIAGNOSIS — F0390 Unspecified dementia without behavioral disturbance: Secondary | ICD-10-CM | POA: Diagnosis not present

## 2018-03-29 DIAGNOSIS — J9601 Acute respiratory failure with hypoxia: Secondary | ICD-10-CM

## 2018-03-29 DIAGNOSIS — E46 Unspecified protein-calorie malnutrition: Secondary | ICD-10-CM | POA: Diagnosis present

## 2018-03-29 DIAGNOSIS — J449 Chronic obstructive pulmonary disease, unspecified: Secondary | ICD-10-CM | POA: Diagnosis present

## 2018-03-29 DIAGNOSIS — I361 Nonrheumatic tricuspid (valve) insufficiency: Secondary | ICD-10-CM | POA: Diagnosis not present

## 2018-03-29 DIAGNOSIS — R609 Edema, unspecified: Secondary | ICD-10-CM | POA: Diagnosis not present

## 2018-03-29 DIAGNOSIS — I5021 Acute systolic (congestive) heart failure: Secondary | ICD-10-CM | POA: Diagnosis present

## 2018-03-29 DIAGNOSIS — F329 Major depressive disorder, single episode, unspecified: Secondary | ICD-10-CM | POA: Diagnosis present

## 2018-03-29 DIAGNOSIS — E43 Unspecified severe protein-calorie malnutrition: Secondary | ICD-10-CM | POA: Diagnosis present

## 2018-03-29 DIAGNOSIS — M79609 Pain in unspecified limb: Secondary | ICD-10-CM

## 2018-03-29 DIAGNOSIS — F17210 Nicotine dependence, cigarettes, uncomplicated: Secondary | ICD-10-CM | POA: Diagnosis present

## 2018-03-29 DIAGNOSIS — F0150 Vascular dementia without behavioral disturbance: Secondary | ICD-10-CM | POA: Diagnosis not present

## 2018-03-29 DIAGNOSIS — I10 Essential (primary) hypertension: Secondary | ICD-10-CM | POA: Diagnosis present

## 2018-03-29 DIAGNOSIS — I130 Hypertensive heart and chronic kidney disease with heart failure and stage 1 through stage 4 chronic kidney disease, or unspecified chronic kidney disease: Secondary | ICD-10-CM | POA: Diagnosis not present

## 2018-03-29 DIAGNOSIS — Z681 Body mass index (BMI) 19 or less, adult: Secondary | ICD-10-CM | POA: Diagnosis not present

## 2018-03-29 DIAGNOSIS — M7989 Other specified soft tissue disorders: Secondary | ICD-10-CM | POA: Diagnosis not present

## 2018-03-29 DIAGNOSIS — J9602 Acute respiratory failure with hypercapnia: Secondary | ICD-10-CM | POA: Diagnosis present

## 2018-03-29 HISTORY — DX: Chronic kidney disease, unspecified: N18.9

## 2018-03-29 HISTORY — DX: Unspecified dementia, unspecified severity, without behavioral disturbance, psychotic disturbance, mood disturbance, and anxiety: F03.90

## 2018-03-29 HISTORY — DX: Chronic obstructive pulmonary disease, unspecified: J44.9

## 2018-03-29 HISTORY — DX: Heart failure, unspecified: I50.9

## 2018-03-29 LAB — BASIC METABOLIC PANEL
Anion gap: 15 (ref 5–15)
BUN: 23 mg/dL — ABNORMAL HIGH (ref 6–20)
CO2: 28 mmol/L (ref 22–32)
Calcium: 9.7 mg/dL (ref 8.9–10.3)
Chloride: 97 mmol/L — ABNORMAL LOW (ref 101–111)
Creatinine, Ser: 1.17 mg/dL — ABNORMAL HIGH (ref 0.44–1.00)
GFR calc Af Amer: 50 mL/min — ABNORMAL LOW (ref >60)
GFR calc non Af Amer: 43 mL/min — ABNORMAL LOW (ref >60)
Glucose, Bld: 84 mg/dL (ref 65–99)
Potassium: 4.2 mmol/L (ref 3.5–5.1)
Sodium: 140 mmol/L (ref 135–145)

## 2018-03-29 LAB — URINALYSIS, ROUTINE W REFLEX MICROSCOPIC
Glucose, UA: NEGATIVE mg/dL
Hgb urine dipstick: NEGATIVE
Ketones, ur: NEGATIVE mg/dL
Leukocytes, UA: NEGATIVE
Nitrite: NEGATIVE
Protein, ur: 100 mg/dL — AB
Specific Gravity, Urine: >1.03 — ABNORMAL HIGH (ref 1.005–1.030)
pH: 5.5 (ref 5.0–8.0)

## 2018-03-29 LAB — URINALYSIS, MICROSCOPIC (REFLEX)
RBC / HPF: NONE SEEN RBC/hpf (ref 0–5)
WBC, UA: NONE SEEN WBC/hpf (ref 0–5)

## 2018-03-29 LAB — BRAIN NATRIURETIC PEPTIDE: B Natriuretic Peptide: >4500 pg/mL — ABNORMAL HIGH (ref 0.0–100.0)

## 2018-03-29 LAB — CBC
HCT: 52.4 % — ABNORMAL HIGH (ref 36.0–46.0)
Hemoglobin: 16.5 g/dL — ABNORMAL HIGH (ref 12.0–15.0)
MCH: 29.7 pg (ref 26.0–34.0)
MCHC: 31.5 g/dL (ref 30.0–36.0)
MCV: 94.4 fL (ref 78.0–100.0)
Platelets: 234 10*3/uL (ref 150–400)
RBC: 5.55 MIL/uL — ABNORMAL HIGH (ref 3.87–5.11)
RDW: 16.4 % — ABNORMAL HIGH (ref 11.5–15.5)
WBC: 4.8 10*3/uL (ref 4.0–10.5)

## 2018-03-29 LAB — I-STAT TROPONIN, ED: Troponin i, poc: 0.07 ng/mL (ref 0.00–0.08)

## 2018-03-29 MED ORDER — FUROSEMIDE 10 MG/ML IJ SOLN
80.0000 mg | Freq: Once | INTRAMUSCULAR | Status: AC
  Administered 2018-03-29: 80 mg via INTRAVENOUS
  Filled 2018-03-29: qty 8

## 2018-03-29 NOTE — ED Notes (Signed)
BNP should be resulted in @ 15 min per lab tech

## 2018-03-29 NOTE — ED Notes (Signed)
Pt has been stuck x3 unable to get blood draw

## 2018-03-29 NOTE — ED Provider Notes (Addendum)
[facility] EMERGENCY DEPARTMENT Provider Note   CSN: [id number] Arrival date & time: 03/29/18  1254     History   Chief Complaint Chief Complaint  Patient presents with  . Leg Swelling    HPI [name] is a 76 y.o. female with history of hypertension, dementia, stroke, tobacco use is here for evaluation of bilateral leg swelling for 2 weeks.  Associated with observed increased work of breathing with walking, feeling more tired, wet sounding cough per caretaker at bedside.  PCP advised patient come to ER for evaluation of blood clots in the legs.  Level 5 caveat applies given history of dementia.  Patient is alert to self only, thinks she is at home and is able to tell me her exact address.  She denies any pain.  She is asking for something sweet to eat.  Caretaker at home denies fevers, vomiting, changes in stools or voiding of urine.  Chart review shows pt last seen by cardiology February 2018 for bilateral toe ulceration, attributed likely to PAD given underlying tobacco use. She ws referred to wound clinic. ABI done at that time normal. Echo in 2016 with EF 40-45%.   HPI  Past Medical History:  Diagnosis Date  . Hypertension   . Insomnia     Patient Active Problem List   Diagnosis Date Noted  . Acute CHF (congestive heart failure) (HCC) 03/29/2018  . Complicated UTI (urinary tract infection)   . Acute confusional state 09/17/2016  . Rhabdomyolysis 07/02/2016  . Fall at home 07/01/2016  . Lactic acid acidosis 12/01/2015  . Metabolic acidosis 12/01/2015  . Protein-calorie malnutrition, severe (HCC) 12/01/2015  . AKI (acute kidney injury) (HCC)   . Hyponatremia   . Hypothermia   . Tachycardia   . Hyperkalemia 11/29/2015  . Acute renal failure (HCC) 11/29/2015  . Chronic combined systolic and diastolic CHF, 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/2015  . Mitral regurgitation 11/29/2015  . Elevated lactic acid level 11/29/2015  . Agitation 11/29/2015  . COPD  (chronic obstructive pulmonary disease) (HCC) 11/29/2015  . Dementia 11/29/2015  . Confusion   . Acute blood loss anemia 08/21/2015  . Postoperative anemia due to acute blood loss 08/20/2015  . Displaced intertrochanteric fracture of left femur (HCC) 08/19/2015  . Closed left hip fracture (HCC) 08/18/2015  . Secondary cardiomyopathy (HCC)   . Acute systolic congestive heart failure, NYHA class 3 (HCC) 03/02/2015  . COPD exacerbation (HCC) 03/02/2015  . Tobacco abuse 03/02/2015  . Severe protein-calorie malnutrition (HCC) 03/02/2015  . HTN (hypertension) 03/02/2015  . Congestive dilated cardiomyopathy (HCC)   . CVA (cerebral infarction) 02/28/2015  . Stroke [facility])     Past Surgical History:  Procedure Laterality Date  . INTRAMEDULLARY (IM) NAIL INTERTROCHANTERIC Left 08/19/2015   Procedure: INTRAMEDULLARY (IM) NAIL INTERTROCHANTRIC;  Surgeon: Jodi Geralds, MD;  Location: [facility] OR;  Service: Orthopedics;  Laterality: Left;     OB History   None      Home Medications    Prior to Admission medications   Medication Sig Start Date End Date Taking? Authorizing Provider  aspirin 81 MG chewable tablet Chew 1 tablet (81 mg total) by mouth daily. 12/01/15   Dhungel, Theda Belfast, MD  carvedilol (COREG) 3.125 MG tablet Take 1 tablet (3.125 mg total) by mouth 2 (two) times daily with a meal. 03/04/15   Rinehuls, Kinnie Scales, PA-C  feeding supplement, ENSURE ENLIVE, (ENSURE ENLIVE) LIQD Take 237 mLs by mouth 3 (three) times daily between meals. 12/01/15  Dhungel, Nishant, MD  HYDROcodone-acetaminophen (NORCO/VICODIN) 5-325 MG tablet Take 1 tablet by mouth as directed.  09/26/16   [provider]  lisinopril (PRINIVIL,ZESTRIL) 2.5 MG tablet Take 2.5 mg by mouth daily. 09/13/16   [provider]  pantoprazole (PROTONIX) 40 MG tablet Take 40 mg by mouth daily. 09/13/16   [provider]  senna-docusate (SENOKOT-S) 8.6-50 MG tablet Take 2 tablets by mouth at bedtime as needed for  moderate constipation. 12/01/15   Dhungel, Theda Belfast, MD  sertraline (ZOLOFT) 50 MG tablet Take 50 mg by mouth daily. 09/13/16   [provider]  traZODone (DESYREL) 50 MG tablet Take 50 mg by mouth at bedtime as needed for sleep. 09/13/16   [provider]    Family History Family History  Problem Relation Age of Onset  . Congestive Heart Failure Son 33    Social History Social History   Tobacco Use  . Smoking status: Current Every Day Smoker    Packs/day: 2.00    Years: 50.00    Pack years: 100.00    Types: Cigarettes  . Smokeless tobacco: Never Used  Substance Use Topics  . Alcohol use: No  . Drug use: No     Allergies   Patient has no known allergies.   Review of Systems Review of Systems  Unable to perform ROS: Dementia  Constitutional: Positive for fatigue.  Respiratory: Positive for cough and shortness of breath.   Cardiovascular: Positive for leg swelling.  All other systems reviewed and are negative.    Physical Exam Updated Vital Signs BP (!) 137/95   Pulse (!) 109   Temp 97.7 F (36.5 C) (Oral)   Resp (!) 23   Wt 55.8 kg (123 lb)   SpO2 100%   BMI 20.16 kg/m   Physical Exam  Constitutional: She is oriented to person, place, and time. She appears well-developed and well-nourished. No distress.  Non toxic. Pleasantly confused frail elderly female.   HENT:  Head: Normocephalic and atraumatic.  Nose: Nose normal.  Moist mucous membranes   Eyes: Pupils are equal, round, and reactive to light. Conjunctivae and EOM are normal.  Neck: Normal range of motion.  Cardiovascular: Normal rate and regular rhythm.  2/3+ pitting edema above knee bilaterally, R slightly worse than L. no calf tenderness. Bilateral toes/feet cold with dark/necrotic tissue at big toes and second toes bilaterally. Non palpable DP pulses. Clear fluid oozing from right medial ankle.   Pulmonary/Chest: Effort normal and breath sounds normal.  Abdominal: Soft. Bowel  sounds are normal. There is no tenderness.  No G/R/R. No suprapubic or CVA tenderness. Negative Murphy's and McBurney's.   Musculoskeletal: Normal range of motion.  5/5 strength with ankle F/E  Neurological: She is alert and oriented to person, place, and time.  Alert. Oriented to self only.   Skin: Skin is warm and dry. Capillary refill takes less than 2 seconds.  Nursing note and vitals reviewed.    ED Treatments / Results  Labs (all labs ordered are listed, but only abnormal results are displayed) Labs Reviewed  BASIC METABOLIC PANEL - Abnormal; Notable for the following components:      Result Value   Chloride 97 (*)    BUN 23 (*)    Creatinine, Ser 1.17 (*)    GFR calc non Af Amer 43 (*)    GFR calc Af Amer 50 (*)    All other components within normal limits  CBC - Abnormal; Notable for the following components:  RBC 5.55 (*)    Hemoglobin 16.5 (*)    HCT 52.4 (*)    RDW 16.4 (*)    All other components within normal limits  BRAIN NATRIURETIC PEPTIDE - Abnormal; Notable for the following components:   B Natriuretic Peptide >4,500.0 (*)    All other components within normal limits  URINALYSIS, ROUTINE W REFLEX MICROSCOPIC - Abnormal; Notable for the following components:   Specific Gravity, Urine >1.030 (*)    Bilirubin Urine SMALL (*)    Protein, ur 100 (*)    All other components within normal limits  URINALYSIS, MICROSCOPIC (REFLEX) - Abnormal; Notable for the following components:   Bacteria, UA RARE (*)    All other components within normal limits  URINE CULTURE  I-STAT TROPONIN, ED    EKG EKG Interpretation  Date/Time:  Monday March 29 2018 13:27:21 EDT Ventricular Rate:  87 PR Interval:  144 QRS Duration: 74 QT Interval:  350 QTC Calculation: 421 R Axis:   60 Text Interpretation:  Normal sinus rhythm Septal infarct , age undetermined Abnormal ECG No significant change since last tracing Confirmed by Jacalyn Lefevre [phone]) on 03/29/2018 5:11:12  PM   Radiology Dg Chest 2 View  Result Date: 03/29/2018 CLINICAL DATA:  Lower extremity swelling EXAM: CHEST - 2 VIEW COMPARISON:  09/17/2016 FINDINGS: Mild cardiomegaly. Hazy airspace disease at the lung bases. Small pleural effusions. Vascular congestion. IMPRESSION: Mild CHF Electronically Signed   By: Jolaine Click M.D.   On: 03/29/2018 18:10    Procedures .Critical Care Performed by: Liberty Handy, PA-C Authorized by: Liberty Handy, PA-C   Critical care provider statement:    Critical care time (minutes):  35   Critical care was necessary to treat or prevent imminent or life-threatening deterioration of the following conditions:  Respiratory failure   Critical care was time spent personally by me on the following activities:  Ordering and review of laboratory studies, ordering and review of radiographic studies, pulse oximetry, re-evaluation of patient's condition, review of old charts, development of treatment plan with patient or surrogate, discussions with consultants, evaluation of patient's response to treatment and examination of patient   I assumed direction of critical care for this patient from another provider in my specialty: no     (including critical care time)  Medications Ordered in ED Medications  furosemide (LASIX) injection 80 mg (has no administration in time range)     Initial Impression / Assessment and Plan / ED Course  I have reviewed the triage vital signs and the nursing notes.  Pertinent labs & imaging results that were available during my care of the patient were reviewed by me and considered in my medical decision making (see chart for details).  Clinical Course as of Mar 29 2214  Mon Mar 29, 2018  1929 IMPRESSION: Mild CHF    DG Chest 2 View [CG]  2048 Creatinine(!): 1.17 [CG]  2048 BUN(!): 23 [CG]  2048 GFR, Est African American(!): 50 [CG]  2048 Hemoglobin(!): 16.5 [CG]  2048 HCT(!): 52.4 [CG]  2102 B Natriuretic Peptide(!):  >4,500.0 [CG]  2131 RLE venous duplex prelim: negative for DVT. Pulsatile venous flow suggestive of increased right side heart failure.    [CG]  2150 Sp02 dropped to 80s with sitting up on bed    [CG]    Clinical Course User Index [CG] Liberty Handy, PA-C   ddx includes PNA vs COPD/bronchitis vs CHF. She denies CP.  No previous h/o DVT or PE and  this is lower on differential.   Labs remarkable for BNP > 4500, mild CHF on CXR and mildly increased creatinine. Pt desaturated to 80% with sitting up in bed. Does not use oxygen at home.  Last echo with EF 40-45%.  Spoke to Dr Kirtland BouchardK who will admit for acute respiratory failure. She is non toxic appearing, with normal WOB. No indication for supplemental oxygen at rest or BiPAP.  Lasix ordered.   2215: Attempted to speak to family regarding code status. Care taker and son have left.  Final Clinical Impressions(s) / ED Diagnoses   Final diagnoses:  Peripheral edema  Acute respiratory failure with hypoxia [facility](HCC)    ED Discharge Orders    None       Jerrell MylarGibbons, Jaggar Benko J, PA-C 03/29/18 2215    Jacalyn LefevreHaviland, Julie, MD 03/29/18 2258    Liberty HandyGibbons, Kilee Hedding J, PA-C 04/08/18 1029    Jacalyn LefevreHaviland, Julie, MD 04/09/18 [phone]

## 2018-03-29 NOTE — ED Notes (Signed)
Pt dropped to 82% on RA attempting to ambulate with walker.  Pt is requesting something to eat since she hasn't had anything since breakfast.

## 2018-03-29 NOTE — Progress Notes (Signed)
RLE venous duplex prelim: negative for DVT. Pulsatile venous flow suggestive of increased right side heart failure. Farrel DemarkJill Eunice, RDMS, RVT

## 2018-03-29 NOTE — ED Provider Notes (Signed)
Patient placed in Quick Look pathway, seen and evaluated   Chief Complaint: leg swelling  HPI:   Tammy [name] is a 77 y.o. female who presents to the ED from her PCP's office due to swelling and pain in the right lower extremity ROS: M/S: right leg swelling  Resp: shortness of breath  Physical Exam:  BP 120/79 (BP Location: Right Arm)   Pulse 80   Temp 97.7 F (36.5 C) (Oral)   Resp 20   Wt 55.8 kg (123 lb)   BMI 20.16 kg/m    Gen: No distress  Neuro: Awake and Alert  Skin: Warm and dry, leg swelling right  Resp: bilateral rales, decreased breath sounds  M/S: right lower extremity with swelling and tenderness on exam.   Initiation of care has begun. The patient has been counseled on the process, plan, and necessity for staying for the completion/evaluation, and the remainder of the medical screening examination    Janne Napoleoneese, Hope M, NP 03/29/18 1319    Loren RacerYelverton, David, MD 03/30/18 [phone]

## 2018-03-29 NOTE — ED Notes (Signed)
Please note: Pt does not walk. However, Pt is 99% on RA.

## 2018-03-29 NOTE — ED Triage Notes (Signed)
Pt arrives via POV from her doctors office with progressive lower extremity leg swelling for the last 3 weeks. Per friend pt with increasing SOB with exertion. Pt alert, hx of alzheimers. VSS.

## 2018-03-29 NOTE — ED Notes (Signed)
Patient observed trying to get out of bed. She states that she is going to turn off the lights.  Patient's care giver reports that patient is sundowning. Re-oriented patient and re-positioned patient

## 2018-03-29 NOTE — ED Notes (Signed)
Attempted to get blood from patient for D-dimer without success

## 2018-03-30 ENCOUNTER — Inpatient Hospital Stay (HOSPITAL_COMMUNITY): Payer: Medicare Other

## 2018-03-30 ENCOUNTER — Encounter (HOSPITAL_COMMUNITY): Payer: Medicare Other

## 2018-03-30 DIAGNOSIS — R609 Edema, unspecified: Secondary | ICD-10-CM

## 2018-03-30 DIAGNOSIS — F0390 Unspecified dementia without behavioral disturbance: Secondary | ICD-10-CM

## 2018-03-30 DIAGNOSIS — I509 Heart failure, unspecified: Secondary | ICD-10-CM

## 2018-03-30 DIAGNOSIS — I739 Peripheral vascular disease, unspecified: Secondary | ICD-10-CM

## 2018-03-30 DIAGNOSIS — I361 Nonrheumatic tricuspid (valve) insufficiency: Secondary | ICD-10-CM

## 2018-03-30 DIAGNOSIS — I340 Nonrheumatic mitral (valve) insufficiency: Secondary | ICD-10-CM

## 2018-03-30 LAB — HEPATIC FUNCTION PANEL
ALT: 17 U/L (ref 0–44)
AST: 32 U/L (ref 15–41)
Albumin: 3.6 g/dL (ref 3.5–5.0)
Alkaline Phosphatase: 69 U/L (ref 38–126)
Bilirubin, Direct: 0.5 mg/dL — ABNORMAL HIGH (ref 0.0–0.2)
Indirect Bilirubin: 1.1 mg/dL — ABNORMAL HIGH (ref 0.3–0.9)
Total Bilirubin: 1.6 mg/dL — ABNORMAL HIGH (ref 0.3–1.2)
Total Protein: 7.4 g/dL (ref 6.5–8.1)

## 2018-03-30 LAB — CBC
HCT: 52.2 % — ABNORMAL HIGH (ref 36.0–46.0)
Hemoglobin: 16.3 g/dL — ABNORMAL HIGH (ref 12.0–15.0)
MCH: 29.2 pg (ref 26.0–34.0)
MCHC: 31.2 g/dL (ref 30.0–36.0)
MCV: 93.5 fL (ref 78.0–100.0)
Platelets: 242 10*3/uL (ref 150–400)
RBC: 5.58 MIL/uL — ABNORMAL HIGH (ref 3.87–5.11)
RDW: 16.1 % — ABNORMAL HIGH (ref 11.5–15.5)
WBC: 5.2 10*3/uL (ref 4.0–10.5)

## 2018-03-30 LAB — BASIC METABOLIC PANEL
Anion gap: 15 (ref 5–15)
BUN: 21 mg/dL (ref 8–23)
CO2: 25 mmol/L (ref 22–32)
Calcium: 9.5 mg/dL (ref 8.9–10.3)
Chloride: 100 mmol/L (ref 98–111)
Creatinine, Ser: 1.15 mg/dL — ABNORMAL HIGH (ref 0.44–1.00)
GFR calc Af Amer: 51 mL/min — ABNORMAL LOW (ref >60)
GFR calc non Af Amer: 44 mL/min — ABNORMAL LOW (ref >60)
Glucose, Bld: 100 mg/dL — ABNORMAL HIGH (ref 70–99)
Potassium: 4.7 mmol/L (ref 3.5–5.1)
Sodium: 140 mmol/L (ref 135–145)

## 2018-03-30 LAB — MAGNESIUM: Magnesium: 1.9 mg/dL (ref 1.7–2.4)

## 2018-03-30 LAB — ECHOCARDIOGRAM COMPLETE
Height: 65.5 in
Weight: 1837.75 oz

## 2018-03-30 LAB — TSH: TSH: 3.47 u[IU]/mL (ref 0.350–4.500)

## 2018-03-30 LAB — TROPONIN I: Troponin I: 0.05 ng/mL (ref <0.03)

## 2018-03-30 MED ORDER — SERTRALINE HCL 50 MG PO TABS
50.0000 mg | ORAL_TABLET | Freq: Every day | ORAL | Status: DC
  Administered 2018-03-30 – 2018-04-02 (×4): 50 mg via ORAL
  Filled 2018-03-30 (×4): qty 1

## 2018-03-30 MED ORDER — ONDANSETRON HCL 4 MG PO TABS: 4.0000 mg | ORAL_TABLET | Freq: Four times a day (QID) | ORAL | Status: DC | PRN

## 2018-03-30 MED ORDER — ENSURE ENLIVE PO LIQD
237.0000 mL | Freq: Three times a day (TID) | ORAL | Status: DC
  Administered 2018-03-30 – 2018-04-02 (×5): 237 mL via ORAL

## 2018-03-30 MED ORDER — POTASSIUM CHLORIDE CRYS ER 10 MEQ PO TBCR
10.0000 meq | EXTENDED_RELEASE_TABLET | Freq: Every day | ORAL | Status: DC
  Administered 2018-03-30 – 2018-04-01 (×4): 10 meq via ORAL
  Filled 2018-03-30 (×5): qty 1

## 2018-03-30 MED ORDER — LISINOPRIL 5 MG PO TABS
2.5000 mg | ORAL_TABLET | Freq: Every day | ORAL | Status: DC
  Administered 2018-03-30 – 2018-04-02 (×4): 2.5 mg via ORAL
  Filled 2018-03-30 (×4): qty 1

## 2018-03-30 MED ORDER — HYDRALAZINE HCL 20 MG/ML IJ SOLN: 5.0000 mg | INTRAMUSCULAR | Status: DC | PRN

## 2018-03-30 MED ORDER — FUROSEMIDE 10 MG/ML IJ SOLN
40.0000 mg | Freq: Two times a day (BID) | INTRAMUSCULAR | Status: DC
  Administered 2018-03-30 – 2018-03-31 (×3): 40 mg via INTRAVENOUS
  Filled 2018-03-30 (×3): qty 4

## 2018-03-30 MED ORDER — ACETAMINOPHEN 650 MG RE SUPP: 650.0000 mg | Freq: Four times a day (QID) | RECTAL | Status: DC | PRN

## 2018-03-30 MED ORDER — PANTOPRAZOLE SODIUM 40 MG PO TBEC
40.0000 mg | DELAYED_RELEASE_TABLET | Freq: Every day | ORAL | Status: DC
  Administered 2018-03-30 – 2018-04-02 (×4): 40 mg via ORAL
  Filled 2018-03-30 (×4): qty 1

## 2018-03-30 MED ORDER — CARVEDILOL 3.125 MG PO TABS
3.1250 mg | ORAL_TABLET | Freq: Two times a day (BID) | ORAL | Status: DC
  Administered 2018-03-30 – 2018-04-02 (×6): 3.125 mg via ORAL
  Filled 2018-03-30 (×7): qty 1

## 2018-03-30 MED ORDER — ENOXAPARIN SODIUM 40 MG/0.4ML ~~LOC~~ SOLN
40.0000 mg | SUBCUTANEOUS | Status: DC
  Administered 2018-03-30 – 2018-04-02 (×3): 40 mg via SUBCUTANEOUS
  Filled 2018-03-30 (×4): qty 0.4

## 2018-03-30 MED ORDER — ONDANSETRON HCL 4 MG/2ML IJ SOLN: 4.0000 mg | Freq: Four times a day (QID) | INTRAMUSCULAR | Status: DC | PRN

## 2018-03-30 MED ORDER — ACETAMINOPHEN 325 MG PO TABS
650.0000 mg | ORAL_TABLET | Freq: Four times a day (QID) | ORAL | Status: DC | PRN
  Administered 2018-03-30 – 2018-04-02 (×5): 650 mg via ORAL
  Filled 2018-03-30 (×7): qty 2

## 2018-03-30 MED ORDER — FUROSEMIDE 10 MG/ML IJ SOLN
40.0000 mg | Freq: Two times a day (BID) | INTRAMUSCULAR | Status: DC
  Administered 2018-03-30: 40 mg via INTRAVENOUS
  Filled 2018-03-30: qty 4

## 2018-03-30 MED ORDER — ASPIRIN 81 MG PO CHEW
81.0000 mg | CHEWABLE_TABLET | Freq: Every day | ORAL | Status: DC
  Administered 2018-03-30 – 2018-04-02 (×4): 81 mg via ORAL
  Filled 2018-03-30 (×4): qty 1

## 2018-03-30 NOTE — Progress Notes (Signed)
Unable to perform ABI at this time due to patient agitation and moving legs (during venous duplex). Will check back later to see if patient is cooperative.

## 2018-03-30 NOTE — Progress Notes (Signed)
Unable to get pulse on left dorsalis pedis or posterior tibial pulse with Doppler. Right tibial pulse with Doppler.  Admitting MD notified.

## 2018-03-30 NOTE — Progress Notes (Signed)
New orders for medications placed by MD Danford, still waiting for pharmacy to verify them so I can give it to patient.

## 2018-03-30 NOTE — Progress Notes (Signed)
Patient is very restless and trying to get out of bed, her O2 saturation were in the 70's, I put 2L oxygen [place] on the patient, she took it out. Tried to listen to her breath sounds but she wont allow me. Charge nurse notified, Dr Maryfrances Bunnellanford notified and I took a verbal order Danford for a International aid/development workertely sitter.

## 2018-03-30 NOTE — Progress Notes (Signed)
PROGRESS NOTE    Holland FallingVirginia T Gammell  OZH:[id number] DOB: [birth year] DOA: 03/29/2018 PCP: Laurann MontanaWhite, Cynthia, MD      Brief Narrative:  Mrs. Tammy [name] is a 77 y.o. F with dementia, home-dwelling, s&dCHF EF 40%, HTN, and PVD who presents with leg swelling from PCP>  Per H&P: "was brought to the ER after patient was noticed to have increasing bilateral lower extremity edema with shortness of breath over the last 2 weeks.  Patient also has bilateral 2 ulceration of both extremities from peripheral arterial disease.  Patient did not complain of any chest pain.  PCP referred patient to the ER with concerns of blood clots in the legs."  CXR with edema, hypoxic, and BNP>4500.  Doppler RLE negative for DVT, given Lasix and admitted for CHF.  THere was some initial concern for ischemic legs, and so vascular surgery were consulted.       Assessment & Plan:  Acute on chronic systolic and diastolic CHF -Furosemide 40 mg IV twice a day  -K supplement -Strict I/Os, daily weights, telemetry  -Daily monitoring renal function -Echo pending   Concern for difficult to palpate pulses in legs VVS evaluated patient.  Legs have no critical ischemia.  They recommend non-urgent ABI. **Of note, son states patient is mostly homebound, but she usually ambulates with a walker, is not wheelchair or bedbound.  Lives alone, has a caregiver that stays with her. -Consult to Vascular surgery  Dementia -Continue sertraline -Tele-sitter Delirium precautions:   -Lights and TV off, minimize interruptions at night  -Blinds open and lights on during day  -Glasses/hearing aid with patient  -Frequent reorientation  -PT/OT when able  -Avoid sedation medications/Beers list medications  COPD No active disease  Hypertension -Continue carvedilol, lisinopril  Chronic kidney disease stage III Baseline creatinine 1.1, stable at baseline. -Daily BMP while diuresing    DVT prophylaxis: Lovenox Code Status:  FULL Family Communication: Son and caregiver at bedside MDM and disposition Plan: The below labs and imaging reports were reviewed and summarized above.    The patient was admitted with CHF.  BNP>4500, CXR with edema, patient hypoxic. Already improvement in leg edema.  LIkely 3-4 more days diuresis, then home with Home health (son has stated patient would be resistant to SNF, but PT are pending)  THere was initial concern for ischemic legs, but there are no ulcers on the feet, and they do not appear ischemic at this time.  Initial work up with ABI   Consultants:   VVS  Procedures:   Echo  Duplex US of legs Negative for DVT bilaterally   ABI  Antimicrobials:   None    Subjective: No pain, no dyspneal.  Appetite good, pleasant.  Objective: Vitals:   03/30/18 0857 03/30/18 1027 03/30/18 1203 03/30/18 1345  BP: 125/89  (!) 141/107 129/88  Pulse: 78  (!) 108 (!) 104  Resp:   20   Temp: 97.9 F (36.6 C)   98.4 F (36.9 C)  TempSrc: Oral   Oral  SpO2: 92% (!) 85% 100% 100%  Weight:      Height:        Intake/Output Summary (Last 24 hours) at 03/30/2018 1439 Last data filed at 03/30/2018 0704 Gross per 24 hour  Intake -  Output 550 ml  Net -550 ml   Filed Weights   03/29/18 1311 03/29/18 2256 03/30/18 0425  Weight: 55.8 kg (123 lb) 53.1 kg (117 lb 1 oz) 52.1 kg (114 lb 13.8 oz)    Examination:  General appearance: Thin elderly adult female, alert and in no acute distress.  Lying in bed, cooperative.  Pleasant.  Demented. Cardiac: RRR, nl S1-S2, no murmurs appreciated.  JVP normal.  LE edema noted. Respiratory: Normal respiratory rate and rhythm. Rales at bases.       Data Reviewed: I have personally reviewed following labs and imaging studies:  CBC: Recent Labs  Lab 03/29/18 1743 03/30/18 0244  WBC 4.8 5.2  HGB 16.5* 16.3*  HCT 52.4* 52.2*  MCV 94.4 93.5  PLT 234 242   Basic Metabolic Panel: Recent Labs  Lab 03/29/18 1743 03/30/18 0825  NA  140 140  K 4.2 4.7  CL 97* 100  CO2 28 25  GLUCOSE 84 100*  BUN 23* 21  CREATININE 1.17* 1.15*  CALCIUM 9.7 9.5  MG  --  1.9   GFR: Estimated Creatinine Clearance: 33.2 mL/min (A) (by C-G formula based on SCr of 1.15 mg/dL (H)). Liver Function Tests: Recent Labs  Lab 03/30/18 0825  AST 32  ALT 17  ALKPHOS 69  BILITOT 1.6*  PROT 7.4  ALBUMIN 3.6   No results for input(s): LIPASE, AMYLASE in the last 168 hours. No results for input(s): AMMONIA in the last 168 hours. Coagulation Profile: No results for input(s): INR, PROTIME in the last 168 hours. Cardiac Enzymes: Recent Labs  Lab 03/30/18 0825  TROPONINI 0.05*   BNP (last 3 results) No results for input(s): PROBNP in the last 8760 hours. HbA1C: No results for input(s): HGBA1C in the last 72 hours. CBG: No results for input(s): GLUCAP in the last 168 hours. Lipid Profile: No results for input(s): CHOL, HDL, LDLCALC, TRIG, CHOLHDL, LDLDIRECT in the last 72 hours. Thyroid Function Tests: Recent Labs    03/30/18 0244  TSH 3.470   Anemia Panel: No results for input(s): VITAMINB12, FOLATE, FERRITIN, TIBC, IRON, RETICCTPCT in the last 72 hours. Urine analysis:    Component Value Date/Time   COLORURINE YELLOW 03/29/2018 2039   APPEARANCEUR CLEAR 03/29/2018 2039   LABSPEC >1.030 (H) 03/29/2018 2039   PHURINE 5.5 03/29/2018 2039   GLUCOSEU NEGATIVE 03/29/2018 2039   HGBUR NEGATIVE 03/29/2018 2039   BILIRUBINUR SMALL (A) 03/29/2018 2039   KETONESUR NEGATIVE 03/29/2018 2039   PROTEINUR 100 (A) 03/29/2018 2039   UROBILINOGEN 0.2 08/19/2015 0756   NITRITE NEGATIVE 03/29/2018 2039   LEUKOCYTESUR NEGATIVE 03/29/2018 2039   Sepsis Labs: @LABRCNTIP (procalcitonin:4,lacticacidven:4)  )No results found for this or any previous visit (from the past 240 hour(s)).       Radiology Studies: Dg Chest 2 View  Result Date: 03/29/2018 CLINICAL DATA:  Lower extremity swelling EXAM: CHEST - 2 VIEW COMPARISON:  09/17/2016  FINDINGS: Mild cardiomegaly. Hazy airspace disease at the lung bases. Small pleural effusions. Vascular congestion. IMPRESSION: Mild CHF Electronically Signed   By: Jolaine Click M.D.   On: 03/29/2018 18:10        Scheduled Meds: . carvedilol  3.125 mg Oral BID WC  . enoxaparin (LOVENOX) injection  40 mg Subcutaneous Q24H  . furosemide  40 mg Intravenous BID  . potassium chloride  10 mEq Oral Daily   Continuous Infusions:   LOS: 1 day    Time spent: 25 minutes    Alberteen Sam, MD [facility] 03/30/2018, 2:39 PM     Pager [phone] --- please page though AMION:  [email or web address] If 7PM-7AM, please contact night-coverage

## 2018-03-30 NOTE — Progress Notes (Signed)
Lab called about a troponin level of 0.05, MD Danford notified.

## 2018-03-30 NOTE — Progress Notes (Signed)
  Echocardiogram 2D Echocardiogram has been performed.  Celene SkeenVijay  Anisah Kuck 03/30/2018, 1:00 PM

## 2018-03-30 NOTE — Progress Notes (Signed)
Patient's son given ring from ED locker.

## 2018-03-30 NOTE — H&P (Signed)
History and Physical    Tammy [name] Tammy [name]:[id number] DOB: 1941-02-09 DOA: 03/29/2018  PCP: Laurann Montana, MD  Patient coming from: Home.  Chief Complaint: Lower extremity edema.  History obtained from ER physician.  No family at the bedside.  Patient has dementia.  HPI: Tammy [name] is a 77 y.o. female with history of chronic systolic heart failure last EF measured was 40 to 45% with grade 2 diastolic dysfunction in June 2016, hypertension, tobacco abuse, peripheral artery disease was brought to the ER after patient was noticed to have increasing bilateral lower extremity edema with shortness of breath over the last 2 weeks.  Patient also has bilateral 2 ulceration of both extremities from peripheral arterial disease.  Patient did not complain of any chest pain.  PCP referred patient to the ER with concerns of blood clots in the legs.  ED Course: In the ER patient is noticed to have bilateral lower extremity edema with ulceration of both lower extremity toes.  Chest x-ray shows CHF and BNP was more than 4500.  Doppler of the right lower extremity was negative for DVT but concerning for right heart failure.  Patient was given Lasix 80 mg IV in the ER and admitted for acute CHF.  Review of Systems: As per HPI, rest all negative.   Past Medical History:  Diagnosis Date  . Hypertension   . Insomnia     Past Surgical History:  Procedure Laterality Date  . INTRAMEDULLARY (IM) NAIL INTERTROCHANTERIC Left 08/19/2015   Procedure: INTRAMEDULLARY (IM) NAIL INTERTROCHANTRIC;  Surgeon: Jodi Geralds, MD;  Location: [facility] OR;  Service: Orthopedics;  Laterality: Left;     reports that she has been smoking cigarettes.  She has a 100.00 pack-year smoking history. She has never used smokeless tobacco. She reports that she does not drink alcohol or use drugs.  No Known Allergies  Family History  Problem Relation Age of Onset  . Congestive Heart Failure Son 55    Prior to Admission  medications   Medication Sig Start Date End Date Taking? Authorizing Provider  aspirin 81 MG chewable tablet Chew 1 tablet (81 mg total) by mouth daily. 12/01/15   Dhungel, Theda Belfast, MD  carvedilol (COREG) 3.125 MG tablet Take 1 tablet (3.125 mg total) by mouth 2 (two) times daily with a meal. 03/04/15   Rinehuls, Kinnie Scales, PA-C  feeding supplement, ENSURE ENLIVE, (ENSURE ENLIVE) LIQD Take 237 mLs by mouth 3 (three) times daily between meals. 12/01/15   Dhungel, Theda Belfast, MD  furosemide (LASIX) 20 MG tablet Take 20 mg by mouth daily as needed for edema. 03/06/18   [provider]  HYDROcodone-acetaminophen (NORCO/VICODIN) 5-325 MG tablet Take 1 tablet by mouth as directed.  09/26/16   [provider]  lisinopril (PRINIVIL,ZESTRIL) 2.5 MG tablet Take 2.5 mg by mouth daily. 09/13/16   [provider]  pantoprazole (PROTONIX) 40 MG tablet Take 40 mg by mouth daily. 09/13/16   [provider]  senna-docusate (SENOKOT-S) 8.6-50 MG tablet Take 2 tablets by mouth at bedtime as needed for moderate constipation. 12/01/15   Dhungel, Theda Belfast, MD  sertraline (ZOLOFT) 50 MG tablet Take 50 mg by mouth daily. 09/13/16   [provider]  traZODone (DESYREL) 50 MG tablet Take 50 mg by mouth at bedtime as needed for sleep. 09/13/16   [provider]    Physical Exam: Vitals:   03/29/18 1900 03/29/18 2130 03/29/18 2232 03/29/18 2256  BP: (!) 137/95  (!) 141/89 (!) 157/87  Pulse: 93 (!)  109 99 (!) 106  Resp: 13 (!) 23 18 16   Temp:    (!) 97 F (36.1 C)  TempSrc:      SpO2: 96% 100% 99% 97%  Weight:    53.1 kg (117 lb 1 oz)  Height:    5' 5.5" (1.664 m)      Constitutional: Moderately built and nourished. Vitals:   03/29/18 1900 03/29/18 2130 03/29/18 2232 03/29/18 2256  BP: (!) 137/95  (!) 141/89 (!) 157/87  Pulse: 93 (!) 109 99 (!) 106  Resp: 13 (!) 23 18 16   Temp:    (!) 97 F (36.1 C)  TempSrc:      SpO2: 96% 100% 99% 97%  Weight:    53.1 kg (117 lb 1  oz)  Height:    5' 5.5" (1.664 m)   Eyes: Anicteric no pallor. ENMT: No discharge from the ears eyes nose or mouth. Neck: JVD elevated no mass felt. Respiratory: No rhonchi or crepitations. Cardiovascular: S1-S2 heard no murmurs appreciated. Abdomen: Soft nontender bowel sounds present. Musculoskeletal: Bilateral lower extremity edema present.  Pulse on the left lower extremities poor and not dopplerable.  Right lower extremity pulses are felt. Skin: Ulceration of the both side toes.  With some ischemic toes. Neurologic: Alert awake oriented to her name only.  Moves all extremities. Psychiatric: Appears confused.   Labs on Admission: I have personally reviewed following labs and imaging studies  CBC: Recent Labs  Lab 03/29/18 1743  WBC 4.8  HGB 16.5*  HCT 52.4*  MCV 94.4  PLT 234   Basic Metabolic Panel: Recent Labs  Lab 03/29/18 1743  NA 140  K 4.2  CL 97*  CO2 28  GLUCOSE 84  BUN 23*  CREATININE 1.17*  CALCIUM 9.7   GFR: Estimated Creatinine Clearance: 33.2 mL/min (A) (by C-G formula based on SCr of 1.17 mg/dL (H)). Liver Function Tests: No results for input(s): AST, ALT, ALKPHOS, BILITOT, PROT, ALBUMIN in the last 168 hours. No results for input(s): LIPASE, AMYLASE in the last 168 hours. No results for input(s): AMMONIA in the last 168 hours. Coagulation Profile: No results for input(s): INR, PROTIME in the last 168 hours. Cardiac Enzymes: No results for input(s): CKTOTAL, CKMB, CKMBINDEX, TROPONINI in the last 168 hours. BNP (last 3 results) No results for input(s): PROBNP in the last 8760 hours. HbA1C: No results for input(s): HGBA1C in the last 72 hours. CBG: No results for input(s): GLUCAP in the last 168 hours. Lipid Profile: No results for input(s): CHOL, HDL, LDLCALC, TRIG, CHOLHDL, LDLDIRECT in the last 72 hours. Thyroid Function Tests: No results for input(s): TSH, T4TOTAL, FREET4, T3FREE, THYROIDAB in the last 72 hours. Anemia Panel: No  results for input(s): VITAMINB12, FOLATE, FERRITIN, TIBC, IRON, RETICCTPCT in the last 72 hours. Urine analysis:    Component Value Date/Time   COLORURINE YELLOW 03/29/2018 2039   APPEARANCEUR CLEAR 03/29/2018 2039   LABSPEC >1.030 (H) 03/29/2018 2039   PHURINE 5.5 03/29/2018 2039   GLUCOSEU NEGATIVE 03/29/2018 2039   HGBUR NEGATIVE 03/29/2018 2039   BILIRUBINUR SMALL (A) 03/29/2018 2039   KETONESUR NEGATIVE 03/29/2018 2039   PROTEINUR 100 (A) 03/29/2018 2039   UROBILINOGEN 0.2 08/19/2015 0756   NITRITE NEGATIVE 03/29/2018 2039   LEUKOCYTESUR NEGATIVE 03/29/2018 2039   Sepsis Labs: @LABRCNTIP (procalcitonin:4,lacticidven:4) )No results found for this or any previous visit (from the past 240 hour(s)).   Radiological Exams on Admission: Dg Chest 2 View  Result Date: 03/29/2018 CLINICAL DATA:  Lower extremity swelling EXAM:  CHEST - 2 VIEW COMPARISON:  09/17/2016 FINDINGS: Mild cardiomegaly. Hazy airspace disease at the lung bases. Small pleural effusions. Vascular congestion. IMPRESSION: Mild CHF Electronically Signed   By: Jolaine ClickArthur  Hoss M.D.   On: 03/29/2018 18:10    EKG: Independently reviewed.  Normal sinus rhythm septal infarct.  Assessment/Plan Principal Problem:   Acute systolic congestive heart failure, NYHA class 3 (HCC) Active Problems:   HTN (hypertension)   COPD (chronic obstructive pulmonary disease) (HCC)   Dementia   Acute CHF (congestive heart failure) (HCC)   CKD (chronic kidney disease) stage 2, GFR 60-89 ml/min    1. Acute systolic heart failure last EF measured was 40 to 45% with grade 2 diastolic dysfunction in June 2016 -patient received Lasix 80 mg IV in the ER I have placed patient on 40 mg IV every 12.  Closely follow intake output and metabolic panel.  Based on her response we will slowly add ACE inhibitors if possible.  Check cardiac markers 2D echo.  Follow daily weights.  Doppler of the right lower 70 was negative for DVT I have ordered Doppler of the  left lower extremity. 2. Absent pulses in the left lower extremity with history of peripheral arterial disease and ulcerations of the lower extremity -I have discussed with Dr. Imogene Burnhen on-call vascular surgeon who advised to get CT and nerves of the lower extremities.  I have ordered repeat basic metabolic panel and if creatinine is acceptable range we will get CT runoff. 3. History of dementia presently not on any medications. 4. History of COPD presently not wheezing. 5. Hypertension -presently on Lasix. 6. Chronic kidney disease stage III -creatinine appears to be at baseline.  Closely follow metabolic panel since patient is on Lasix.  Patient's home dose of medication is to be verified once patient's family available.   DVT prophylaxis: Lovenox. Code Status: Full code. Family Communication: No family at the bedside. Disposition Plan: Home. Consults called: Vascular surgeon. Admission status: Inpatient.   Eduard ClosArshad N Maimouna Rondeau MD [facility] Pager [phone].  If 7PM-7AM, please contact night-coverage [email or web address] Password [email or web address]  03/30/2018, 12:03 AM

## 2018-03-30 NOTE — Progress Notes (Signed)
LLE venous duplex prelim: negative for DVT. Pulsatile venous flow suggestive of increased right side heart failure. Farrel DemarkJill Eunice, RDMS, RVT

## 2018-03-30 NOTE — Progress Notes (Signed)
Tele sitter called me about the patient not wanting to stay in bed even with them telling her to stay in bed. They said that the patient will benefit from a sitter. MD Danford notified.  Family at bedside will continue to monitor.

## 2018-03-30 NOTE — Progress Notes (Signed)
PROGRESS NOTE    [place]  ZOX:[id number] DOB: May 05, 1941 DOA: 03/29/2018 PCP: Laurann MontanaWhite, Cynthia, MD    Brief Narrative:  Patient is a 77 year old female with history of CHF with EF 40-45% and grade 2 diastolic dysfunction, HTN, tobacco abuse, and peripheral artery disease admitted for increased bilateral lower extremity edema and shortness of breath. Left LE found to be pulseless, vascular was consulted. They report not limb threatening as motor and sensory intact. Patient had bilateral LE duplex which were negative for DVT.    Assessment & Plan:   Principal Problem:   Acute systolic congestive heart failure, NYHA class 3 (HCC) Active Problems:   HTN (hypertension)   COPD (chronic obstructive pulmonary disease) (HCC)   Dementia   Acute CHF (congestive heart failure) (HCC)   CKD (chronic kidney disease) stage 2, GFR 60-89 ml/min   Acute on chronic systolic heart failure -Shortness of breath likely secondary to acute heart failure exacerbation. Patient exhibits peripheral edema and elevated BNP >4,500 -Continue to monitor for changing signs of fluid overload, continue daily weights -Lasix 40mg  BID  Peripheral artery disease -decreased pulses bilaterally,  due to comorbidities vascular considers poor candidate for surgical intervention. Pt is able to move ankles and toes without pain making ischemia less likley -Bilateral ABI pending -Bilateral venous doppler negative  Hypertension -Continue Lasix, BP stable  COPD -Patient saturating well on RA when sitting, desats to 80s when moving. Likley secondary to fluid overload and not COPD. Patient shows no signs of increased work of breathing.  Dementia -Pt not on medication, oriented to person and place only.   CKD Stage II -Scr stable at 1.17, 1.15   DVT prophylaxis:Lovenox  Code Status: Full Code Family Communication: None at bed side Disposition Plan:    Consultants:   Vascular  Procedures:   Bilateral LE  duplex  Bilateral ABI pending  Antimicrobials:   None   Subjective: Patient states she is not in any pain, does not have shortness of breath, and the swelling in her legs is not any different than normal. Patient is difficult to obtain a history from due to dementia. She keeps stating that she wants to go home.    Objective: Vitals:   03/29/18 2256 03/30/18 0425 03/30/18 0857 03/30/18 1027  BP: (!) 157/87 (!) 144/86 125/89   Pulse: (!) 106 100 78   Resp: 16 18    Temp: (!) 97 F (36.1 C) 97.6 F (36.4 C) 97.9 F (36.6 C)   TempSrc:  Oral Oral   SpO2: 97% 98% 92% (!) 85%  Weight: 53.1 kg (117 lb 1 oz) 52.1 kg (114 lb 13.8 oz)    Height: 5' 5.5" (1.664 m)       Intake/Output Summary (Last 24 hours) at 03/30/2018 1114 Last data filed at 03/30/2018 0704 Gross per 24 hour  Intake -  Output 550 ml  Net -550 ml   Filed Weights   03/29/18 1311 03/29/18 2256 03/30/18 0425  Weight: 55.8 kg (123 lb) 53.1 kg (117 lb 1 oz) 52.1 kg (114 lb 13.8 oz)    Examination:   General: Patient moving around in bed, difficult to examine Neurology: Awake and alert, non focal  E ENT: no pallor, no icterus, oral mucosa moist Cardiovascular: No JVD. S1-S2 present, rhythmic, no gallops, rubs, or murmurs. Bilateral 3+ lower extremity edema, lower extremity peripheral pulses non palpable  Pulmonary: decreased air movement bilaterally, no wheezing, rhonchi or rales. Gastrointestinal. Abdomen flat, no organomegaly, non tender, no  rebound or guarding Skin. Feet are dark and dusky Musculoskeletal: no joint deformities     Data Reviewed: I have personally reviewed following labs and imaging studies  CBC: Recent Labs  Lab 03/29/18 1743 03/30/18 0244  WBC 4.8 5.2  HGB 16.5* 16.3*  HCT 52.4* 52.2*  MCV 94.4 93.5  PLT 234 242   Basic Metabolic Panel: Recent Labs  Lab 03/29/18 1743 03/30/18 0825  NA 140 140  K 4.2 4.7  CL 97* 100  CO2 28 25  GLUCOSE 84 100*  BUN 23* 21  CREATININE  1.17* 1.15*  CALCIUM 9.7 9.5  MG  --  1.9   GFR: Estimated Creatinine Clearance: 33.2 mL/min (A) (by C-G formula based on SCr of 1.15 mg/dL (H)). Liver Function Tests: Recent Labs  Lab 03/30/18 0825  AST 32  ALT 17  ALKPHOS 69  BILITOT 1.6*  PROT 7.4  ALBUMIN 3.6   No results for input(s): LIPASE, AMYLASE in the last 168 hours. No results for input(s): AMMONIA in the last 168 hours. Coagulation Profile: No results for input(s): INR, PROTIME in the last 168 hours. Cardiac Enzymes: Recent Labs  Lab 03/30/18 0825  TROPONINI 0.05*   BNP (last 3 results) No results for input(s): PROBNP in the last 8760 hours. HbA1C: No results for input(s): HGBA1C in the last 72 hours. CBG: No results for input(s): GLUCAP in the last 168 hours. Lipid Profile: No results for input(s): CHOL, HDL, LDLCALC, TRIG, CHOLHDL, LDLDIRECT in the last 72 hours. Thyroid Function Tests: Recent Labs    03/30/18 0244  TSH 3.470   Anemia Panel: No results for input(s): VITAMINB12, FOLATE, FERRITIN, TIBC, IRON, RETICCTPCT in the last 72 hours.    Radiology Studies: I have reviewed all of the imaging during this hospital visit personally     Scheduled Meds: . enoxaparin (LOVENOX) injection  40 mg Subcutaneous Q24H  . furosemide  40 mg Intravenous Q12H  . potassium chloride  10 mEq Oral Daily   Continuous Infusions:   LOS: 1 day    Cheri Kearns, PA-S   [facility] Pager [phone]

## 2018-03-30 NOTE — Progress Notes (Signed)
Output inaccurate. PT disoriented and continues to remove purewick.

## 2018-03-30 NOTE — Progress Notes (Signed)
BP was 141/107, MD notified

## 2018-03-30 NOTE — Progress Notes (Addendum)
   Daily Progress Note   Briefly reviewed this patient's chart.  She was seen by Dr. Kirke CorinArida on 12/02/16 with known ulcers on both feet at that time.  Screening ABI were normal at that time.  Today, BNP > 4500 suggesting severe CHF decompensation.  Lab Results  Component Value Date   WBC 4.8 03/29/2018   HGB 16.5 (H) 03/29/2018   HCT 52.4 (H) 03/29/2018   MCV 94.4 03/29/2018   PLT 234 03/29/2018    Unfortunately at this time of night, no vascular lab support, so would get CTA Abd/pelvis with runoff as patient's history is inadequate due to dementia.  Lack of reliability in history is suggested in Dr. Jari SportsmanArida's note.  Additionally, the ED documents the patient is non-ambulatory.  Generally, I do not pursue re-vascularization in non-ambulatory patients, as the benefit of such procedures generally are mostly realized only by ambulating, thus the risk to benefit ratio does not favor intervention in these patients.  In this patient patient with acute CHF exacerbation and known multiple co-morbidities: dementia, cardiomyopathy, protein malnutrition and mitral regurgitation, I doubt would survive any significant procedure other than a quick thrombectomy.  Also the patient has been sx in R leg since the ED note at 1310 yesterday.  If patient truly has acute limb ischemia, the leg is likely not salvageable at this point.  Will see patient after CTA abd/pelvis with runoff is completed.   Leonides SakeBrian Chen, MD, FACS [facility] [place] Office: [phone] Pager: [phone]  03/30/2018, 1:29 AM

## 2018-03-30 NOTE — Progress Notes (Signed)
Paged MD regarding pt having difficulty breathing, pt placed on nasal canula 2L saturating 88%  Pt continually trying to removed nasal canula after thorough education  No respiratory therapy orders for breathing treatment  NT at bedside  Primary RN aware

## 2018-03-30 NOTE — Consult Note (Addendum)
Requested by:  Dr. Toniann Fail ([facility])  Reason for consultation: Right leg pain    History of Present Illness   Tammy [name] is a 77 y.o. (December 07, 1940) female who presents with cc: "neck hurts".  Pt has known dementia so history is obtained from chart.  She was obviously confused with questioning. She was also hostile to examination.  She denies any leg pain this AM.  Reportedly yesterday she was complaining of R leg pain: no details could be obtained from the patient due to her limitations.  She reports she walks with a walker, while the ED documented she does not walk.  The Patient was seen by Dr. Kirke Corin in 12/02/16 for bilateral foot ulcers.  Screening ABI was normal at that time.    Past Medical History:  Diagnosis Date  . Hypertension   . Insomnia   Acute on CHF COPD Dementia CKD Stage 2 CVA Tobacco abuse Protein malnutrition Cardiomyopathy L hip fx L femur fx  Past Surgical History:  Procedure Laterality Date  . INTRAMEDULLARY (IM) NAIL INTERTROCHANTERIC Left 08/19/2015   Procedure: INTRAMEDULLARY (IM) NAIL INTERTROCHANTRIC;  Surgeon: Jodi Geralds, MD;  Location: [facility] OR;  Service: Orthopedics;  Laterality: Left;     Social History   Socioeconomic History  . Marital status: Single    Spouse name: Not on file  . Number of children: Not on file  . Years of education: Not on file  . Highest education level: Not on file  Occupational History  . Occupation: retired  Engineer, production  . Financial resource strain: Not on file  . Food insecurity:    Worry: Not on file    Inability: Not on file  . Transportation needs:    Medical: Not on file    Non-medical: Not on file  Tobacco Use  . Smoking status: Current Every Day Smoker    Packs/day: 2.00    Years: 50.00    Pack years: 100.00    Types: Cigarettes  . Smokeless tobacco: Never Used  Substance and Sexual Activity  . Alcohol use: No  . Drug use: No  . Sexual activity: Not on file  Lifestyle  .  Physical activity:    Days per week: Not on file    Minutes per session: Not on file  . Stress: Not on file  Relationships  . Social connections:    Talks on phone: Not on file    Gets together: Not on file    Attends religious service: Not on file    Active member of club or organization: Not on file    Attends meetings of clubs or organizations: Not on file    Relationship status: Not on file  . Intimate partner violence:    Fear of current or ex partner: Not on file    Emotionally abused: Not on file    Physically abused: Not on file    Forced sexual activity: Not on file  Other Topics Concern  . Not on file  Social History Narrative  . Not on file    Family History  Problem Relation Age of Onset  . Congestive Heart Failure Son 55    Current Facility-Administered Medications  Medication Dose Route Frequency Provider Last Rate Last Dose  . acetaminophen (TYLENOL) tablet 650 mg  650 mg Oral Q6H PRN Eduard Clos, MD       Or  . acetaminophen (TYLENOL) suppository 650 mg  650 mg Rectal Q6H PRN Eduard Clos, MD      .  enoxaparin (LOVENOX) injection 40 mg  40 mg Subcutaneous Q24H Eduard ClosKakrakandy, Arshad N, MD   40 mg at 03/30/18 0149  . furosemide (LASIX) injection 40 mg  40 mg Intravenous Q12H Eduard ClosKakrakandy, Arshad N, MD   40 mg at 03/30/18 0552  . hydrALAZINE (APRESOLINE) injection 5 mg  5 mg Intravenous Q4H PRN Eduard ClosKakrakandy, Arshad N, MD      . ondansetron [facility](ZOFRAN) tablet 4 mg  4 mg Oral Q6H PRN Eduard ClosKakrakandy, Arshad N, MD       Or  . ondansetron [facility](ZOFRAN) injection 4 mg  4 mg Intravenous Q6H PRN Eduard ClosKakrakandy, Arshad N, MD      . potassium chloride (K-DUR,KLOR-CON) CR tablet 10 mEq  10 mEq Oral Daily Eduard ClosKakrakandy, Arshad N, MD   10 mEq at 03/30/18 0149    No Known Allergies  REVIEW OF SYSTEMS (negative unless checked):   Unable to obtain due to confusion   For VQI Use Only   PRE-ADM LIVING Home  AMB STATUS Bedridden  CAD Sx None  PRIOR CHF Severe  STRESS TEST No      Physical Examination     Vitals:   03/29/18 2130 03/29/18 2232 03/29/18 2256 03/30/18 0425  BP:  (!) 141/89 (!) 157/87 (!) 144/86  Pulse: (!) 109 99 (!) 106 100  Resp: (!) 23 18 16 18   Temp:   (!) 97 F (36.1 C) 97.6 F (36.4 C)  TempSrc:    Oral  SpO2: 100% 99% 97% 98%  Weight:   117 lb 1 oz (53.1 kg) 114 lb 13.8 oz (52.1 kg)  Height:   5' 5.5" (1.664 m)    Body mass index is 18.82 kg/m.  General Alert, Confused, Cachectic, Elderly, hostile  Head [place]/AT, Temporalis wasting  Ear/Nose/ Throat Hearing grossly intact, nares without erythema or drainage, would not cooperate with oropharynx exam  Eyes PERRLA, EOMI,    Neck Supple, mid-line trachea,    Pulmonary Sym exp, good B air movt, CTA B  Cardiac RRR, Nl S1, S2, no Murmurs, No rubs, No S3,S4  Vascular Vessel Right Left  Radial Palpable Palpable  Brachial Palpable Palpable  Carotid Palpable, No Bruit Palpable, No Bruit  Aorta Not palpable N/A  Femoral Palpable Palpable  Popliteal Not palpable Not palpable  PT Not palpable Not palpable  DP Not palpable Not palpable    Gastro- intestinal soft, non-distended, non-tender to palpation, No guarding or rebound, no HSM, no masses, no CVAT B, No palpable prominent aortic pulse,    Musculo- skeletal Does not cooperate with motor exam but moves all extremities including R foot spontaneously,  Extremities without ischemic changes except darkened cool feet, L hand cold also, No edema present, No visible varicosities , No Lipodermatosclerosis present  Neurologic Unable to cooperate with exam, does note soft touch to R foot  Psychiatric Unable to cooperate with exam, angry affect  Dermatologic See M/S exam for extremity exam, No rashes otherwise noted  Lymphatic  Palpable lymph nodes: None   Laboratory   CBC CBC Latest Ref Rng & Units 03/30/2018 03/29/2018 10/30/2017  WBC 4.0 - 10.5 K/uL 5.2 4.8 6.0  Hemoglobin 12.0 - 15.0 g/dL 16.3(H) 16.5(H) 14.2  Hematocrit 36.0 - 46.0 %  52.2(H) 52.4(H) 44.0  Platelets 150 - 400 K/uL 242 234 233    BMP BMP Latest Ref Rng & Units 03/29/2018 10/30/2017 09/21/2016  Glucose 65 - 99 mg/dL 84 161(W116(H) 91  BUN 6 - 20 mg/dL 96(E23(H) 45(W33(H) 15  Creatinine 0.44 - 1.00 mg/dL 0.98(J1.17(H) 1.91(Y1.59(H)  0.81  Sodium 135 - 145 mmol/L 140 145 132(L)  Potassium 3.5 - 5.1 mmol/L 4.2 5.1 4.5  Chloride 101 - 111 mmol/L 97(L) 108 105  CO2 22 - 32 mmol/L 28 23 19(L)  Calcium 8.9 - 10.3 mg/dL 9.7 9.5 5.7(Q)    Coagulation Lab Results  Component Value Date   INR 1.08 08/18/2015   INR 1.21 02/28/2015   No results found for: PTT  Lipids    Component Value Date/Time   CHOL 120 03/01/2015 0218   TRIG 60 03/01/2015 0218   HDL 47 03/01/2015 0218   CHOLHDL 2.6 03/01/2015 0218   VLDL 12 03/01/2015 0218   LDLCALC 61 03/01/2015 0218   BNP >4500   Medical Decision Making   Red Christians Whiteford is a 77 y.o. female who presents with: dementia, CHF exacerbation, multiple co-morbidities   Pt is currently asx, so nothing immediately needs to be done.  Pt has intact motor and sensation so pt does NOT have a threatened limb.  Would start with BLE ABI.   Ok to hold on CTA abd/pelvis with runoff as ABI would be a better screening study.  This patient's dementia demonstrates significant functional limitation that will limit the valve of any revascularization, if needed.  Thank you for allowing [place] to participate in this patient's care.   Leonides Sake, MD, FACS [facility] [place] Office: [phone] Pager: [phone]  03/30/2018, 7:30 AM

## 2018-03-30 NOTE — Progress Notes (Signed)
Pt not alert and oriented, will try to complete admission database when pt family arrives

## 2018-03-31 ENCOUNTER — Inpatient Hospital Stay (HOSPITAL_COMMUNITY): Payer: Medicare Other

## 2018-03-31 ENCOUNTER — Other Ambulatory Visit: Payer: Self-pay

## 2018-03-31 DIAGNOSIS — F0150 Vascular dementia without behavioral disturbance: Secondary | ICD-10-CM

## 2018-03-31 DIAGNOSIS — M7989 Other specified soft tissue disorders: Secondary | ICD-10-CM

## 2018-03-31 DIAGNOSIS — I5021 Acute systolic (congestive) heart failure: Secondary | ICD-10-CM

## 2018-03-31 DIAGNOSIS — J449 Chronic obstructive pulmonary disease, unspecified: Secondary | ICD-10-CM

## 2018-03-31 DIAGNOSIS — I10 Essential (primary) hypertension: Secondary | ICD-10-CM

## 2018-03-31 DIAGNOSIS — N182 Chronic kidney disease, stage 2 (mild): Secondary | ICD-10-CM

## 2018-03-31 LAB — URINE CULTURE: Culture: NO GROWTH

## 2018-03-31 MED ORDER — NICOTINE 21 MG/24HR TD PT24
21.0000 mg | MEDICATED_PATCH | Freq: Every day | TRANSDERMAL | Status: DC
  Administered 2018-03-31 – 2018-04-02 (×3): 21 mg via TRANSDERMAL
  Filled 2018-03-31 (×3): qty 1

## 2018-03-31 NOTE — Clinical Social Work Note (Signed)
No family at bedside. Left voicemail for patient's son. Will discuss SNF placement when he calls back.  Charlynn CourtSarah Kariel Skillman, CSW [phone]

## 2018-03-31 NOTE — Progress Notes (Signed)
ABI's have been completed. Right 0.81 Left 0.76  03/31/18 1:45 PM Olen CordialGreg Blanche Scovell RVT

## 2018-03-31 NOTE — Evaluation (Signed)
Physical Therapy Evaluation Patient Details Name: Tammy [name] Vital MRN: [id number] DOB: Nov 27, 1940 Today's Date: 03/31/2018   History of Present Illness  Pt adm with acute on chronic heart failure and possible LE ischemia. Vascular consulted and didn'[name] feel there was critical ischemia. PMH - dementia, chf, HTN, copd, ckd, lt hip fx  Clinical Impression  Pt presents to PT currently needing physical assist to get OOB and ambulate. Per chart pt lives alone and has a caregiver. If pt has caregiver 24/7 that can physically assist her then [facility] If pt doesn'[name] have caregiver who can physically assist with all mobility then recommend ST-SNF.     Follow Up Recommendations SNF(Unless pt has 24 hour physical assist)    Equipment Recommendations  None recommended by PT    Recommendations for Other Services       Precautions / Restrictions Precautions Precautions: Fall      Mobility  Bed Mobility Overal bed mobility: Needs Assistance Bed Mobility: Supine to Sit;Sit to Supine     Supine to sit: Min assist Sit to supine: Min guard   General bed mobility comments: Assist to elevate trunk into sitting.  Transfers Overall transfer level: Needs assistance Equipment used: Rolling walker (2 wheeled) Transfers: Sit to/from Stand Sit to Stand: Mod assist         General transfer comment: Assist to bring hips up. Pt with posterior lean  Ambulation/Gait Ambulation/Gait assistance: Min assist Gait Distance (Feet): 100 Feet Assistive device: Rolling walker (2 wheeled) Gait Pattern/deviations: Step-through pattern;Decreased step length - right;Decreased step length - left;Trunk flexed Gait velocity: decr Gait velocity interpretation: <1.8 ft/sec, indicate of risk for recurrent falls General Gait Details: Assist for balance and to steer walker. Verbal/tactile cues to stay closer to walker.  Stairs            Wheelchair Mobility    Modified Rankin  (Stroke Patients Only)       Balance Overall balance assessment: Needs assistance Sitting-balance support: No upper extremity supported;Feet supported Sitting balance-Leahy Scale: Good     Standing balance support: Bilateral upper extremity supported Standing balance-Leahy Scale: Poor Standing balance comment: walker and min assist for static standing                             Pertinent Vitals/Pain Pain Assessment: Faces Faces Pain Scale: No hurt    Home Living Family/patient expects to be discharged to:: Private residence Living Arrangements: Children Available Help at Discharge: Family;Personal care attendant(Unsure of availability) Type of Home: House Home Access: Level entry     Home Layout: One level Home Equipment: Walker - 2 wheels Additional Comments: Pt unable to provide information. Information gathered from prior admissions and current notes    Prior Function Level of Independence: Needs assistance   Gait / Transfers Assistance Needed: Per chart pt amb with rolling walker. Unsure of level of assist.           Hand Dominance   Dominant Hand: Right    Extremity/Trunk Assessment   Upper Extremity Assessment Upper Extremity Assessment: Generalized weakness    Lower Extremity Assessment Lower Extremity Assessment: Generalized weakness       Communication      Cognition Arousal/Alertness: Awake/alert Behavior During Therapy: WFL for tasks assessed/performed Overall Cognitive Status: History of cognitive impairments - at baseline  General Comments: Severe dementia. Pt constantly asking for keys so we could go to the store for cigarettes.      General Comments      Exercises     Assessment/Plan    PT Assessment Patient needs continued PT services  PT Problem List Decreased strength;Decreased activity tolerance;Decreased balance;Decreased mobility;Decreased knowledge of use of  DME;Decreased safety awareness       PT Treatment Interventions DME instruction;Gait training;Functional mobility training;Therapeutic activities;Therapeutic exercise;Balance training;Patient/family education    PT Goals (Current goals can be found in the Care Plan section)  Acute Rehab PT Goals Patient Stated Goal: Pt unable to state PT Goal Formulation: Patient unable to participate in goal setting Time For Goal Achievement: 04/14/18 Potential to Achieve Goals: Good    Frequency Min 3X/week   Barriers to discharge        Co-evaluation               AM-PAC PT "6 Clicks" Daily Activity  Outcome Measure Difficulty turning over in bed (including adjusting bedclothes, sheets and blankets)?: A Little Difficulty moving from lying on back to sitting on the side of the bed? : Unable Difficulty sitting down on and standing up from a chair with arms (e.g., wheelchair, bedside commode, etc,.)?: Unable Help needed moving to and from a bed to chair (including a wheelchair)?: A Little Help needed walking in hospital room?: A Little Help needed climbing 3-5 steps with a railing? : A Lot 6 Click Score: 13    End of Session Equipment Utilized During Treatment: Gait belt Activity Tolerance: Patient limited by fatigue Patient left: in bed;with call bell/phone within reach;with bed alarm set;Other (comment)(telesitter) Nurse Communication: Mobility status PT Visit Diagnosis: Unsteadiness on feet (R26.81);Other abnormalities of gait and mobility (R26.89);Muscle weakness (generalized) (M62.81)    Time: 1000-1021 PT Time Calculation (min) (ACUTE ONLY): 21 min   Charges:   PT Evaluation $PT Eval Moderate Complexity: 1 Mod     PT G CodesMarland Kitchen        [facility] PT [phone]   Angelina Ok [facility] 03/31/2018, 1:33 PM

## 2018-03-31 NOTE — Progress Notes (Addendum)
   Daily Progress Note  Nothing to add to this patient's care as she is asx currently.  Awaiting ABI.   I would reiterate again that I don't recommend any interventions in this patient short of development of frank gangrene and rest pain in this patient given her substantial limitations due to her dementia.   Leonides SakeBrian Gordana Kewley, MD, FACS [facility] [place] Office: [phone] Pager: [phone]  03/31/2018, 7:56 AM   Addendum  Non-invasive Vascular Imaging   ABI (03/31/2018 )  R:   ABI: 0.81,   PT: mono  DP: bi  TBI:  ND  L:   ABI: 0.76,   PT: mono  DP: mono  TBI: ND  Patient has moderate PAD in both legs.  I would manage this patient medically.  Follow up as needed.   Leonides SakeBrian Arbie Blankley, MD, FACS [facility] [place] Office: [phone] Pager: [phone]  03/31/2018, 4:23 PM

## 2018-03-31 NOTE — Progress Notes (Signed)
PROGRESS NOTE  [name]  ZOX:[id number] DOB: 1941/04/24 DOA: 03/29/2018 PCP: Laurann Montana, MD   Brief Narrative: Tammy [name] is a 77 y.o. female with a history of dementia, chronic combined CHF (EF40%), HTN, and PVD who presented to the ED by way of her PCP's office for increasing lower extremity swelling and dyspnea for 2 weeks. The patient was hypoxic with pulmonary edema on CXR, BNP >4.5k, LE U/S negative for DVT. IV lasix was provided and she was admitted.   Assessment & Plan: Principal Problem:   Acute systolic congestive heart failure, NYHA class 3 (HCC) Active Problems:   HTN (hypertension)   COPD (chronic obstructive pulmonary disease) (HCC)   Dementia   Acute CHF (congestive heart failure) (HCC)   CKD (chronic kidney disease) stage 2, GFR 60-89 ml/min  Acute on chronic combined CHF: Updated echo showed EF 25-30%, G2DD, mod RV systolic dysfunction. BNP >4.5k. Weights 123lbs > 117lbs on 6/24, continues decreasing. Last admission in 2017, weights were 119 - 128lbs. - Continue lasix 40mg  IV BID. Previously on lasix 20mg  po daily.  - Strict I/O, daily weights, monitor BMP (Cr stable)  PVD: VVS consulted, though there are no indications of criticl ischemia or indications for further Tx at this time. ABIs were 0.81 (R), 0.76 (L). Note pulsatile LE venous flow consistent with elevated right-sided pressures.  - Continue ASA.   COPD: No flare. - Continue home medications  Stage III CKD: SCr near baseline.  - Monitor closely with diuresis.  HTN:  - Continue lisinopril 2.5mg , coreg 3.125mg  BID  Tobacco use:  - Nicotine patch  Dementia:  - Use sitter as needed for patient safety  Depression:  - Continue SSRI  DVT prophylaxis: Lovenox Code Status: Full Family Communication: None at bedside Disposition Plan: Home with home health once euvolemic, possible in next 24-48hrs. Pt's son has declined SNF in favor of 24hr caretaker support at home.  Consultants:    Vascular surgery  Procedures:   Echocardiogram 6/25: - Left ventricle: The cavity size was normal. Wall thickness was   normal. Systolic function was severely reduced. The estimated   ejection fraction was in the range of 25% to 30%. Features are   consistent with a pseudonormal left ventricular filling pattern,   with concomitant abnormal relaxation and increased filling   pressure (grade 2 diastolic dysfunction). - Aortic valve: There was trivial regurgitation. - Mitral valve: There was moderate regurgitation. - Left atrium: The atrium was moderately dilated. - Right ventricle: The cavity size was mildly to moderately   dilated. Systolic function was mildly to moderately reduced. - Tricuspid valve: There was moderate regurgitation. - Pulmonary arteries: Systolic pressure was moderately increased.   PA peak pressure: 59 mm Hg (S).  Antimicrobials:  None   Subjective: Pt confused, denies any troublesome symptoms.  Objective: Vitals:   03/30/18 2010 03/31/18 0003 03/31/18 0439 03/31/18 1430  BP: (!) 115/58 122/60 119/73 119/64  Pulse: 92 92 93 86  Resp: 18 18 18 18   Temp: (!) 97.5 F (36.4 C) 98.2 F (36.8 C) 97.8 F (36.6 C) 97.9 F (36.6 C)  TempSrc: Axillary Axillary Axillary Axillary  SpO2: 96% 92% 100% 100%  Weight:   51.1 kg (112 lb 10.5 oz)   Height:        Intake/Output Summary (Last 24 hours) at 03/31/2018 1607 Last data filed at 03/31/2018 1431 Gross per 24 hour  Intake 597 ml  Output 1300 ml  Net -703 ml   [employer]  03/29/18 2256 03/30/18 0425 03/31/18 0439  Weight: 53.1 kg (117 lb 1 oz) 52.1 kg (114 lb 13.8 oz) 51.1 kg (112 lb 10.5 oz)    Gen: 77 y.o. female in no distress  Pulm: Non-labored breathing room air. +Scant crackles. CV: Regular rate and rhythm. No murmur, rub, or gallop. No JVD, +LE R>LLE pitting edema.  GI: Abdomen soft, non-tender, non-distended, with normoactive bowel sounds. No organomegaly or masses felt. Ext: Warm, no  deformities Skin: No rashes, lesions or ulcers Neuro: Alert and confused. No focal neurological deficits. Psych: Judgement and insight appear impaired. Mood & affect appropriate.   Data Reviewed: I have personally reviewed following labs and imaging studies  CBC: Recent Labs  Lab 03/29/18 1743 03/30/18 0244  WBC 4.8 5.2  HGB 16.5* 16.3*  HCT 52.4* 52.2*  MCV 94.4 93.5  PLT 234 242   Basic Metabolic Panel: Recent Labs  Lab 03/29/18 1743 03/30/18 0825  NA 140 140  K 4.2 4.7  CL 97* 100  CO2 28 25  GLUCOSE 84 100*  BUN 23* 21  CREATININE 1.17* 1.15*  CALCIUM 9.7 9.5  MG  --  1.9   GFR: Estimated Creatinine Clearance: 32.5 mL/min (A) (by C-G formula based on SCr of 1.15 mg/dL (H)). Liver Function Tests: Recent Labs  Lab 03/30/18 0825  AST 32  ALT 17  ALKPHOS 69  BILITOT 1.6*  PROT 7.4  ALBUMIN 3.6   No results for input(s): LIPASE, AMYLASE in the last 168 hours. No results for input(s): AMMONIA in the last 168 hours. Coagulation Profile: No results for input(s): INR, PROTIME in the last 168 hours. Cardiac Enzymes: Recent Labs  Lab 03/30/18 0825  TROPONINI 0.05*   BNP (last 3 results) No results for input(s): PROBNP in the last 8760 hours. HbA1C: No results for input(s): HGBA1C in the last 72 hours. CBG: No results for input(s): GLUCAP in the last 168 hours. Lipid Profile: No results for input(s): CHOL, HDL, LDLCALC, TRIG, CHOLHDL, LDLDIRECT in the last 72 hours. Thyroid Function Tests: Recent Labs    03/30/18 0244  TSH 3.470   Anemia Panel: No results for input(s): VITAMINB12, FOLATE, FERRITIN, TIBC, IRON, RETICCTPCT in the last 72 hours. Urine analysis:    Component Value Date/Time   COLORURINE YELLOW 03/29/2018 2039   APPEARANCEUR CLEAR 03/29/2018 2039   LABSPEC >1.030 (H) 03/29/2018 2039   PHURINE 5.5 03/29/2018 2039   GLUCOSEU NEGATIVE 03/29/2018 2039   HGBUR NEGATIVE 03/29/2018 2039   BILIRUBINUR SMALL (A) 03/29/2018 2039   KETONESUR  NEGATIVE 03/29/2018 2039   PROTEINUR 100 (A) 03/29/2018 2039   UROBILINOGEN 0.2 08/19/2015 0756   NITRITE NEGATIVE 03/29/2018 2039   LEUKOCYTESUR NEGATIVE 03/29/2018 2039   Recent Results (from the past 240 hour(s))  Urine culture     Status: None   Collection Time: 03/29/18  8:39 PM  Result Value Ref Range Status   Specimen Description URINE, RANDOM  Final   Special Requests NONE  Final   Culture   Final    NO GROWTH Performed at [facility], [address], [place], [place] [address]    Report Status 03/31/2018 FINAL  Final      Radiology Studies: Dg Chest 2 View  Result Date: 03/29/2018 CLINICAL DATA:  Lower extremity swelling EXAM: CHEST - 2 VIEW COMPARISON:  09/17/2016 FINDINGS: Mild cardiomegaly. Hazy airspace disease at the lung bases. Small pleural effusions. Vascular congestion. IMPRESSION: Mild CHF Electronically Signed   By: Dahlia Client.D.  On: 03/29/2018 18:10    Scheduled Meds: . aspirin  81 mg Oral Daily  . carvedilol  3.125 mg Oral BID WC  . enoxaparin (LOVENOX) injection  40 mg Subcutaneous Q24H  . feeding supplement (ENSURE ENLIVE)  237 mL Oral TID BM  . furosemide  40 mg Intravenous BID  . lisinopril  2.5 mg Oral Daily  . nicotine  21 mg Transdermal Daily  . pantoprazole  40 mg Oral Daily  . potassium chloride  10 mEq Oral Daily  . sertraline  50 mg Oral Daily   Continuous Infusions:   LOS: 2 days   Time spent: 25 minutes.  Tyrone Nineyan B Lasheika Ortloff, MD [facility] [email or web address] Password [email or web address] 03/31/2018, 4:07 PM

## 2018-04-01 ENCOUNTER — Encounter (HOSPITAL_COMMUNITY): Payer: Self-pay | Admitting: General Practice

## 2018-04-01 DIAGNOSIS — J9602 Acute respiratory failure with hypercapnia: Secondary | ICD-10-CM | POA: Diagnosis present

## 2018-04-01 LAB — BASIC METABOLIC PANEL
Anion gap: 9 (ref 5–15)
BUN: 32 mg/dL — ABNORMAL HIGH (ref 8–23)
CO2: 31 mmol/L (ref 22–32)
Calcium: 8.8 mg/dL — ABNORMAL LOW (ref 8.9–10.3)
Chloride: 100 mmol/L (ref 98–111)
Creatinine, Ser: 1.19 mg/dL — ABNORMAL HIGH (ref 0.44–1.00)
GFR calc Af Amer: 49 mL/min — ABNORMAL LOW (ref >60)
GFR calc non Af Amer: 43 mL/min — ABNORMAL LOW (ref >60)
Glucose, Bld: 86 mg/dL (ref 70–99)
Potassium: 4.4 mmol/L (ref 3.5–5.1)
Sodium: 140 mmol/L (ref 135–145)

## 2018-04-01 MED ORDER — FUROSEMIDE 40 MG PO TABS
40.0000 mg | ORAL_TABLET | Freq: Once | ORAL | Status: AC
  Administered 2018-04-01: 40 mg via ORAL
  Filled 2018-04-01: qty 1

## 2018-04-01 MED ORDER — FUROSEMIDE 10 MG/ML IJ SOLN
40.0000 mg | Freq: Two times a day (BID) | INTRAMUSCULAR | Status: DC
  Administered 2018-04-01 – 2018-04-02 (×2): 40 mg via INTRAVENOUS
  Filled 2018-04-01 (×2): qty 4

## 2018-04-01 MED ORDER — FUROSEMIDE 40 MG PO TABS
40.0000 mg | ORAL_TABLET | Freq: Every day | ORAL | Status: DC
  Administered 2018-04-01: 40 mg via ORAL
  Filled 2018-04-01: qty 1

## 2018-04-01 NOTE — Plan of Care (Signed)
  Problem: Activity: Goal: Risk for activity intolerance will decrease Outcome: Progressing Note:  Up walking with Pt   Problem: Nutrition: Goal: Adequate nutrition will be maintained Note:  Likes snacks instead of a balanced meal.   Problem: Pain Managment: Goal: General experience of comfort will improve Note:  Tylenol given

## 2018-04-01 NOTE — Clinical Social Work Note (Signed)
Per MD progress note and RN in morning progression meeting, patient's son is refusing SNF placement in favor of 24/7 assistance that is in place at home.   CSW signing off. Consult again if any other social work needs arise.  Charlynn CourtSarah Avenir Lozinski, CSW [phone]

## 2018-04-01 NOTE — Progress Notes (Signed)
Physical Therapy Treatment Patient Details Name: Tammy [name] MRN: [id number] DOB: 03-24-1941 Today's Date: 04/01/2018    History of Present Illness Pt adm with acute on chronic heart failure and possible LE ischemia. Vascular consulted and didn't feel there was critical ischemia. PMH - dementia, chf, HTN, copd, ckd, lt hip fx    PT Comments    Patient unable to progress with therapy this visit, limited by confusion. Patient slow to follow commands due to dementia and asking for various items around room. Mod A for bed mobility, and unable to ambulate safely this visit with poor balance collapsing back onto bed from standing EOB with max A, 5 trials. At this time patient is high fall risk and will need 24/7 physical assistance if to d/c back home. Per chart family has denied SNF and confirmed assistance, no family present to discuss level of assistance needed or focus on safe transfer training.    Follow Up Recommendations  SNF(unless pt has 24/7 physical assist at home)     Equipment Recommendations  None recommended by PT    Recommendations for Other Services       Precautions / Restrictions Precautions Precautions: Fall    Mobility  Bed Mobility Overal bed mobility: Needs Assistance Bed Mobility: Supine to Sit;Sit to Supine     Supine to sit: Mod assist Sit to supine: Mod assist      Transfers Overall transfer level: Needs assistance Equipment used: Rolling walker (2 wheeled) Transfers: Sit to/from Stand Sit to Stand: Max assist         General transfer comment: patient unable to get weight underneath her hips with standing, posterior lean. x5 trials of standing each resulting her collpasing back onto bed, unable to ambulate this visit.   Ambulation/Gait             General Gait Details: unable this visit due to confusion, posterior lean, sitting back onto bed.   Stairs             Wheelchair Mobility    Modified Rankin (Stroke Patients  Only)       Balance Overall balance assessment: Needs assistance Sitting-balance support: No upper extremity supported;Feet supported Sitting balance-Leahy Scale: Good     Standing balance support: Bilateral upper extremity supported Standing balance-Leahy Scale: Zero                              Cognition Arousal/Alertness: Awake/alert Behavior During Therapy: WFL for tasks assessed/performed Overall Cognitive Status: History of cognitive impairments - at baseline                                 General Comments: severe dementia, asking me to drop her off at the store for chewing gum.      Exercises      General Comments        Pertinent Vitals/Pain Pain Assessment: No/denies pain    Home Living                      Prior Function            PT Goals (current goals can now be found in the care plan section) Acute Rehab PT Goals Patient Stated Goal: Pt unable to state PT Goal Formulation: Patient unable to participate in goal setting Time For Goal Achievement: 04/14/18 Potential to Achieve Goals:  Good Progress towards PT goals: Not progressing toward goals - comment(Limited by confusion this visit. )    Frequency    Min 3X/week      PT Plan Current plan remains appropriate    Co-evaluation              AM-PAC PT "6 Clicks" Daily Activity  Outcome Measure  Difficulty turning over in bed (including adjusting bedclothes, sheets and blankets)?: A Little Difficulty moving from lying on back to sitting on the side of the bed? : Unable Difficulty sitting down on and standing up from a chair with arms (e.g., wheelchair, bedside commode, etc,.)?: Unable Help needed moving to and from a bed to chair (including a wheelchair)?: A Lot Help needed walking in hospital room?: Total Help needed climbing 3-5 steps with a railing? : Total 6 Click Score: 9    End of Session Equipment Utilized During Treatment: Gait  belt Activity Tolerance: Patient limited by fatigue Patient left: in bed;with call bell/phone within reach;with bed alarm set;Other (comment) Nurse Communication: Mobility status PT Visit Diagnosis: Unsteadiness on feet (R26.81);Other abnormalities of gait and mobility (R26.89);Muscle weakness (generalized) (M62.81)     Time: [id number] PT Time Calculation (min) (ACUTE ONLY): 35 min  Charges:  $Therapeutic Activity: 23-37 mins                    G Codes:       Etta GrandchildSean Coden Franchi, PT, DPT Acute Rehab Services Pager: [phone]    Etta GrandchildSean Evangela Heffler 04/01/2018, 8:59 AM

## 2018-04-01 NOTE — Progress Notes (Addendum)
PROGRESS NOTE  [name]  WUJ:[id number] DOB: 1941-07-10 DOA: 03/29/2018 PCP: Laurann MontanaWhite, Cynthia, MD   Brief Narrative: [place] FallingVirginia T [name] is a 77 y.o. female with a history of dementia, chronic combined CHF (EF40%), HTN, and PVD who presented to the ED by way of her PCP's office for increasing lower extremity swelling and dyspnea for 2 weeks. The patient was hypoxic with pulmonary edema on CXR, BNP >4.5k, LE U/S negative for DVT. IV lasix was provided and she was admitted.   Assessment & Plan: Principal Problem:   Acute systolic congestive heart failure, NYHA class 3 (HCC) Active Problems:   Severe protein-calorie malnutrition (HCC)   HTN (hypertension)   COPD (chronic obstructive pulmonary disease) (HCC)   Dementia   Acute CHF (congestive heart failure) (HCC)   CKD (chronic kidney disease) stage 2, GFR 60-89 ml/min   Acute respiratory failure with hypoxia (HCC)  Acute on chronic combined CHF: Updated echo showed EF 25-30%, G2DD, mod RV systolic dysfunction. BNP >4.5k. Weights 123lbs > 117lbs on 6/24, continues decreasing. Last admission in 2017, weights were 119 - 128lbs. - Continue lasix 40mg  IV BID (lost IV this AM so gave 80mg  po total). Previously on lasix 20mg  po daily.  - Strict I/O, daily weights, monitor BMP. BUN rising with stable creatinine, suspect her new EDW will be near 112lbs (stable from yesterday).   PVD: VVS consulted, though there are no indications of criticl ischemia or indications for further Tx at this time. ABIs were 0.81 (R), 0.76 (L). Note pulsatile LE venous flow consistent with elevated right-sided pressures.  - Continue ASA.   COPD: No flare. - Continue home medications  Stage III CKD: SCr near baseline.  - Monitor closely with diuresis.  HTN:  - Continue lisinopril 2.5mg , coreg 3.125mg  BID  Tobacco use:  - Nicotine patch  Dementia:  - Use sitter as needed for patient safety  Depression:  - Continue SSRI  Severe protein-calorie  malnutrition:  - Supplement as tolerated  Acute hypoxic respiratory failure: Resolved.   DVT prophylaxis: Lovenox Code Status: Full Family Communication: Son at bedside on PM rounds yesterday, discussed with him by phone this morning. Disposition Plan: Will return home with 24hr care facilitated by her son. Needs at least 1 more day of IV diuresis.  Consultants:   Vascular surgery  Procedures:   Echocardiogram 6/25: - Left ventricle: The cavity size was normal. Wall thickness was   normal. Systolic function was severely reduced. The estimated   ejection fraction was in the range of 25% to 30%. Features are   consistent with a pseudonormal left ventricular filling pattern,   with concomitant abnormal relaxation and increased filling   pressure (grade 2 diastolic dysfunction). - Aortic valve: There was trivial regurgitation. - Mitral valve: There was moderate regurgitation. - Left atrium: The atrium was moderately dilated. - Right ventricle: The cavity size was mildly to moderately   dilated. Systolic function was mildly to moderately reduced. - Tricuspid valve: There was moderate regurgitation. - Pulmonary arteries: Systolic pressure was moderately increased.   PA peak pressure: 59 mm Hg (S).  Antimicrobials:  None   Subjective: Pt denies dyspnea, or pain anywhere. Unsure of swelling and remains confused, requiring frequent redirection by RN staff.   Objective: Vitals:   03/31/18 1430 03/31/18 2102 04/01/18 0615 04/01/18 0635  BP: 119/64 (!) 118/54  140/82  Pulse: 86 86  93  Resp: 18 18  18   Temp: 97.9 F (36.6 C) 98 F (36.7 C)  TempSrc: Axillary Oral    SpO2: 100% 91%  96%  Weight:   51.2 kg (112 lb 14 oz)   Height:        Intake/Output Summary (Last 24 hours) at 04/01/2018 1712 Last data filed at 04/01/2018 1500 Gross per 24 hour  Intake 720 ml  Output 1250 ml  Net -530 ml   Filed Weights   03/30/18 0425 03/31/18 0439 04/01/18 0615  Weight: 52.1 kg  (114 lb 13.8 oz) 51.1 kg (112 lb 10.5 oz) 51.2 kg (112 lb 14 oz)   Gen: 77 y.o. female in no distress Pulm: Nonlabored breathing. Still with crackles at bases. CV: Regular rate and rhythm. No murmur, rub, or gallop. No JVD, RLE > LLE pitting dependent edema. GI: Abdomen soft, non-tender, non-distended, with normoactive bowel sounds.  Ext: Warm, no deformities Skin: No rashes, lesions or ulcers on visualized skin.  Neuro: Alert and confused. No focal neurological deficits, though pt not cooperative with exam. Psych: Judgement and insight appear impaired. Mood euthymic & affect congruent.    Data Reviewed: I have personally reviewed following labs and imaging studies  CBC: Recent Labs  Lab 03/29/18 1743 03/30/18 0244  WBC 4.8 5.2  HGB 16.5* 16.3*  HCT 52.4* 52.2*  MCV 94.4 93.5  PLT 234 242   Basic Metabolic Panel: Recent Labs  Lab 03/29/18 1743 03/30/18 0825 04/01/18 0628  NA 140 140 140  K 4.2 4.7 4.4  CL 97* 100 100  CO2 28 25 31   GLUCOSE 84 100* 86  BUN 23* 21 32*  CREATININE 1.17* 1.15* 1.19*  CALCIUM 9.7 9.5 8.8*  MG  --  1.9  --    GFR: Estimated Creatinine Clearance: 31.5 mL/min (A) (by C-G formula based on SCr of 1.19 mg/dL (H)). Liver Function Tests: Recent Labs  Lab 03/30/18 0825  AST 32  ALT 17  ALKPHOS 69  BILITOT 1.6*  PROT 7.4  ALBUMIN 3.6   No results for input(s): LIPASE, AMYLASE in the last 168 hours. No results for input(s): AMMONIA in the last 168 hours. Coagulation Profile: No results for input(s): INR, PROTIME in the last 168 hours. Cardiac Enzymes: Recent Labs  Lab 03/30/18 0825  TROPONINI 0.05*   BNP (last 3 results) No results for input(s): PROBNP in the last 8760 hours. HbA1C: No results for input(s): HGBA1C in the last 72 hours. CBG: No results for input(s): GLUCAP in the last 168 hours. Lipid Profile: No results for input(s): CHOL, HDL, LDLCALC, TRIG, CHOLHDL, LDLDIRECT in the last 72 hours. Thyroid Function  Tests: Recent Labs    03/30/18 0244  TSH 3.470   Anemia Panel: No results for input(s): VITAMINB12, FOLATE, FERRITIN, TIBC, IRON, RETICCTPCT in the last 72 hours. Urine analysis:    Component Value Date/Time   COLORURINE YELLOW 03/29/2018 2039   APPEARANCEUR CLEAR 03/29/2018 2039   LABSPEC >1.030 (H) 03/29/2018 2039   PHURINE 5.5 03/29/2018 2039   GLUCOSEU NEGATIVE 03/29/2018 2039   HGBUR NEGATIVE 03/29/2018 2039   BILIRUBINUR SMALL (A) 03/29/2018 2039   KETONESUR NEGATIVE 03/29/2018 2039   PROTEINUR 100 (A) 03/29/2018 2039   UROBILINOGEN 0.2 08/19/2015 0756   NITRITE NEGATIVE 03/29/2018 2039   LEUKOCYTESUR NEGATIVE 03/29/2018 2039   Recent Results (from the past 240 hour(s))  Urine culture     Status: None   Collection Time: 03/29/18  8:39 PM  Result Value Ref Range Status   Specimen Description URINE, RANDOM  Final   Special Requests NONE  Final   Culture  Final    NO GROWTH Performed at [facility], [address] [place], [place] [address]    Report Status 03/31/2018 FINAL  Final      Radiology Studies: No results found.  Scheduled Meds: . aspirin  81 mg Oral Daily  . carvedilol  3.125 mg Oral BID WC  . enoxaparin (LOVENOX) injection  40 mg Subcutaneous Q24H  . feeding supplement (ENSURE ENLIVE)  237 mL Oral TID BM  . furosemide  40 mg Intravenous BID  . lisinopril  2.5 mg Oral Daily  . nicotine  21 mg Transdermal Daily  . pantoprazole  40 mg Oral Daily  . potassium chloride  10 mEq Oral Daily  . sertraline  50 mg Oral Daily   Continuous Infusions:   LOS: 3 days   Time spent: 25 minutes.  Tyrone Nine, MD [facility] [email or web address] Password [facility] 04/01/2018, 5:12 PM

## 2018-04-01 NOTE — Progress Notes (Signed)
Pt calling frequently for snacks and ensure and very forgetful. Gave teaching and reinforcement on fluid and sodium restriction however patient is not receptive.

## 2018-04-01 NOTE — Care Management Important Message (Signed)
Important Message  Patient Details  Name: Tammy [name] MRN: [id number] Date of Birth: August 24, 1941   Medicare Important Message Given:  Yes    Oralia RudMegan P Migdalia Olejniczak 04/01/2018, 2:29 PM

## 2018-04-02 DIAGNOSIS — E43 Unspecified severe protein-calorie malnutrition: Secondary | ICD-10-CM

## 2018-04-02 DIAGNOSIS — J9601 Acute respiratory failure with hypoxia: Secondary | ICD-10-CM

## 2018-04-02 LAB — BASIC METABOLIC PANEL
Anion gap: 10 (ref 5–15)
BUN: 40 mg/dL — ABNORMAL HIGH (ref 8–23)
CO2: 31 mmol/L (ref 22–32)
Calcium: 8.7 mg/dL — ABNORMAL LOW (ref 8.9–10.3)
Chloride: 98 mmol/L (ref 98–111)
Creatinine, Ser: 1.13 mg/dL — ABNORMAL HIGH (ref 0.44–1.00)
GFR calc Af Amer: 53 mL/min — ABNORMAL LOW (ref >60)
GFR calc non Af Amer: 45 mL/min — ABNORMAL LOW (ref >60)
Glucose, Bld: 71 mg/dL (ref 70–99)
Potassium: 5.1 mmol/L (ref 3.5–5.1)
Sodium: 139 mmol/L (ref 135–145)

## 2018-04-02 MED ORDER — FUROSEMIDE 40 MG PO TABS: 40.0000 mg | ORAL_TABLET | Freq: Every day | ORAL | 0 refills | Status: DC

## 2018-04-02 NOTE — Progress Notes (Addendum)
Called caregiver to make  Aware of discharge home son  will be over later for pick up.

## 2018-04-02 NOTE — Discharge Summary (Signed)
Physician Discharge Summary  Tammy [name] ZOX:[id number] DOB: 19-Jan-1941 DOA: 03/29/2018  PCP: Laurann Montana, MD  Admit date: 03/29/2018 Discharge date: 04/02/2018  Admitted From: Home Disposition: Home   Recommendations for Outpatient Follow-up:  1. Follow up with PCP in 1-2 weeks. Increased lasix 20mg  po daily > 40mg  po daily. Will need recheck BMP.   Home Health: PT, OT, RN, aide; Pt's son declined SNF in favor of arranging 24hr care for the patient at home.  Equipment/Devices: None new Discharge Condition: Stable CODE STATUS: Full Diet recommendation: Heart healthy  Brief/Interim Summary: Tammy [name] is a 77 y.o. female with a history of dementia, chronic combined CHF (EF40%), HTN, and PVD who presented to the ED by way of her PCP's office for increasing lower extremity swelling and dyspnea for 2 weeks. The patient was hypoxic with pulmonary edema on CXR, BNP >4.5k, LE U/S negative for DVT. IV lasix was provided and she was admitted. Diuresis was effective and creatinine remained stable. She is down from 123lbs on admission to 107lbs at discharge.   Discharge Diagnoses:  Principal Problem:   Acute systolic congestive heart failure, NYHA class 3 (HCC) Active Problems:   Severe protein-calorie malnutrition (HCC)   HTN (hypertension)   COPD (chronic obstructive pulmonary disease) (HCC)   Dementia   Acute CHF (congestive heart failure) (HCC)   CKD (chronic kidney disease) stage 2, GFR 60-89 ml/min   Acute respiratory failure with hypoxia (HCC)  Acute on chronic combined CHF: Updated echo showed EF 25-30%, G2DD, mod RV systolic dysfunction. BNP >4.5k. Weights 123lbs > 117lbs on 6/24, continues decreasing. Last admission in 2017, weights were 119 - 128lbs. - Continue lasix 40mg  po daily (was given 40mg  IV BID with significant diuresis). - Cr stable with rising BUN and improved LE edema; feel she is at her dry weight. EDW ~107lbs.    PVD: VVS consulted, though there are no  indications of criticl ischemia or indications for further Tx at this time. ABIs were 0.81 (R), 0.76 (L). Note pulsatile LE venous flow consistent with elevated right-sided pressures.  - Continue ASA.   COPD: No flare. - Continue home medications  Stage III CKD: SCr near baseline.  - Monitor closely with diuresis.  HTN:  - Continue lisinopril 2.5mg , coreg 3.125mg  BID  Tobacco use:  - Nicotine patch - Cessation counseling provided, though pt and Son state she will likely smoke indefinitely.  Dementia:  - Continue 24hr home care at discharge.  Depression:  - Continue SSRI  Severe protein-calorie malnutrition:  - Supplement as tolerated  Acute hypoxic respiratory failure: Resolved.   Discharge Instructions Discharge Instructions    (HEART FAILURE PATIENTS) Call MD:  Anytime you have any of the following symptoms: 1) 3 pound weight gain in 24 hours or 5 pounds in 1 week 2) shortness of breath, with or without a dry hacking cough 3) swelling in the hands, feet or stomach 4) if you have to sleep on extra pillows at night in order to breathe.   Complete by:  As directed    Diet - low sodium heart healthy   Complete by:  As directed    Discharge instructions   Complete by:  As directed    You were treated for acute heart failure and have improved. You will need to take lasix 40mg  once daily (up from 20mg  once daily that you were taking previously). This prescription have been sent to your pharmacy. You will need to follow up with your doctor in  the next 1-2 weeks for hospital follow up and recheck of labs. If you notice increasing weight or trouble breathing or leg swelling that is getting worse, seek medical attention.   Increase activity slowly   Complete by:  As directed      Allergies as of 04/02/2018   No Known Allergies     Medication List    STOP taking these medications   aspirin 81 MG chewable tablet     TAKE these medications   carvedilol 3.125 MG  tablet Commonly known as:  COREG Take 1 tablet (3.125 mg total) by mouth 2 (two) times daily with a meal.   feeding supplement (ENSURE ENLIVE) Liqd Take 237 mLs by mouth 3 (three) times daily between meals.   furosemide 40 MG tablet Commonly known as:  LASIX Take 1 tablet (40 mg total) by mouth daily. What changed:    medication strength  how much to take   lisinopril 2.5 MG tablet Commonly known as:  PRINIVIL,ZESTRIL Take 2.5 mg by mouth daily.   pantoprazole 40 MG tablet Commonly known as:  PROTONIX Take 40 mg by mouth daily.   sertraline 50 MG tablet Commonly known as:  ZOLOFT Take 50 mg by mouth daily.   traZODone 50 MG tablet Commonly known as:  DESYREL Take 50 mg by mouth at bedtime as needed for sleep.      Follow-up Information    Laurann Montana, MD.   Specialty:  Family Medicine Contact information: [address] [place] [address] [phone]          No Known Allergies  Consultations:  None  Procedures/Studies: Dg Chest 2 View  Result Date: 03/29/2018 CLINICAL DATA:  Lower extremity swelling EXAM: CHEST - 2 VIEW COMPARISON:  09/17/2016 FINDINGS: Mild cardiomegaly. Hazy airspace disease at the lung bases. Small pleural effusions. Vascular congestion. IMPRESSION: Mild CHF Electronically Signed   By: Jolaine Click M.D.   On: 03/29/2018 18:10   Subjective: Confused, no new complaints. At mental baseline.   Discharge Exam: Vitals:   04/01/18 1956 04/02/18 0422  BP: 103/68 130/82  Pulse: 88 86  Resp: 18 18  Temp: 98 F (36.7 C) 98.1 F (36.7 C)  SpO2: 95% 96%   General: Pt is alert, awake, not in acute distress Cardiovascular: RRR, S1/S2 +, no rubs, no gallops Respiratory: CTA bilaterally, no wheezing, no rhonchi Abdominal: Soft, NT, ND, bowel sounds + Extremities: trace-to-1+ pitting RLE>LLE edema, no cyanosis  Labs: BNP (last 3 results) Recent Labs    03/29/18 1730  BNP >4,500.0*   Basic Metabolic  Panel: Recent Labs  Lab 03/29/18 1743 03/30/18 0825 04/01/18 0628 04/02/18 0712  NA 140 140 140 139  K 4.2 4.7 4.4 5.1  CL 97* 100 100 98  CO2 28 25 31 31   GLUCOSE 84 100* 86 71  BUN 23* 21 32* 40*  CREATININE 1.17* 1.15* 1.19* 1.13*  CALCIUM 9.7 9.5 8.8* 8.7*  MG  --  1.9  --   --    Liver Function Tests: Recent Labs  Lab 03/30/18 0825  AST 32  ALT 17  ALKPHOS 69  BILITOT 1.6*  PROT 7.4  ALBUMIN 3.6   No results for input(s): LIPASE, AMYLASE in the last 168 hours. No results for input(s): AMMONIA in the last 168 hours. CBC: Recent Labs  Lab 03/29/18 1743 03/30/18 0244  WBC 4.8 5.2  HGB 16.5* 16.3*  HCT 52.4* 52.2*  MCV 94.4 93.5  PLT 234 242  Cardiac Enzymes: Recent Labs  Lab 03/30/18 0825  TROPONINI 0.05*   BNP: Invalid input(s): POCBNP CBG: No results for input(s): GLUCAP in the last 168 hours. D-Dimer No results for input(s): DDIMER in the last 72 hours. Hgb A1c No results for input(s): HGBA1C in the last 72 hours. Lipid Profile No results for input(s): CHOL, HDL, LDLCALC, TRIG, CHOLHDL, LDLDIRECT in the last 72 hours. Thyroid function studies No results for input(s): TSH, T4TOTAL, T3FREE, THYROIDAB in the last 72 hours.  Invalid input(s): FREET3 Anemia work up No results for input(s): VITAMINB12, FOLATE, FERRITIN, TIBC, IRON, RETICCTPCT in the last 72 hours. Urinalysis    Component Value Date/Time   COLORURINE YELLOW 03/29/2018 2039   APPEARANCEUR CLEAR 03/29/2018 2039   LABSPEC >1.030 (H) 03/29/2018 2039   PHURINE 5.5 03/29/2018 2039   GLUCOSEU NEGATIVE 03/29/2018 2039   HGBUR NEGATIVE 03/29/2018 2039   BILIRUBINUR SMALL (A) 03/29/2018 2039   KETONESUR NEGATIVE 03/29/2018 2039   PROTEINUR 100 (A) 03/29/2018 2039   UROBILINOGEN 0.2 08/19/2015 0756   NITRITE NEGATIVE 03/29/2018 2039   LEUKOCYTESUR NEGATIVE 03/29/2018 2039    Microbiology Recent Results (from the past 240 hour(s))  Urine culture     Status: None   Collection Time:  03/29/18  8:39 PM  Result Value Ref Range Status   Specimen Description URINE, RANDOM  Final   Special Requests NONE  Final   Culture   Final    NO GROWTH Performed at [facility], [address] [place], [place] [address]    Report Status 03/31/2018 FINAL  Final    Time coordinating discharge: Approximately 40 minutes  Tyrone Nineyan B Nicoletta Hush, MD  [facility] 04/02/2018, 10:23 AM Pager [phone]

## 2018-04-02 NOTE — Plan of Care (Signed)
  Problem: Activity: Goal: Risk for activity intolerance will decrease Outcome: Progressing   Problem: Nutrition: Goal: Adequate nutrition will be maintained Outcome: Progressing   Problem: Coping: Goal: Level of anxiety will decrease Outcome: Progressing   

## 2018-04-02 NOTE — Progress Notes (Addendum)
[facility]; [facility] son, no answer at this time; Melva RN informed to call CM when he arrives [facility]; B Shelba FlakeChandler RN,MHA,BSN [phone]  1:15 pm - CM talked to patient's son at the bedside [facility], [facility]; Dan with [facility] called for arrangements; Alexis GoodellB Malay Fantroy RN,MHA,BSN [phone]

## 2018-06-04 ENCOUNTER — Emergency Department (HOSPITAL_COMMUNITY): Payer: Medicare Other

## 2018-06-04 ENCOUNTER — Inpatient Hospital Stay (HOSPITAL_COMMUNITY): Payer: Medicare Other

## 2018-06-04 ENCOUNTER — Encounter (HOSPITAL_COMMUNITY): Payer: Self-pay

## 2018-06-04 ENCOUNTER — Other Ambulatory Visit: Payer: Self-pay

## 2018-06-04 ENCOUNTER — Inpatient Hospital Stay (HOSPITAL_COMMUNITY)
Admission: EM | Admit: 2018-06-04 | Discharge: 2018-06-16 | DRG: 100 | Disposition: A | Discharge status: Skilled Nursing Facility | Payer: Medicare Other | Attending: Internal Medicine | Admitting: Internal Medicine

## 2018-06-04 DIAGNOSIS — I130 Hypertensive heart and chronic kidney disease with heart failure and stage 1 through stage 4 chronic kidney disease, or unspecified chronic kidney disease: Secondary | ICD-10-CM | POA: Diagnosis present

## 2018-06-04 DIAGNOSIS — R402312 Coma scale, best motor response, none, at arrival to emergency department: Secondary | ICD-10-CM | POA: Diagnosis present

## 2018-06-04 DIAGNOSIS — G9340 Encephalopathy, unspecified: Secondary | ICD-10-CM | POA: Diagnosis not present

## 2018-06-04 DIAGNOSIS — Z66 Do not resuscitate: Secondary | ICD-10-CM | POA: Diagnosis not present

## 2018-06-04 DIAGNOSIS — I739 Peripheral vascular disease, unspecified: Secondary | ICD-10-CM | POA: Diagnosis present

## 2018-06-04 DIAGNOSIS — J9601 Acute respiratory failure with hypoxia: Secondary | ICD-10-CM | POA: Diagnosis present

## 2018-06-04 DIAGNOSIS — G40901 Epilepsy, unspecified, not intractable, with status epilepticus: Secondary | ICD-10-CM | POA: Diagnosis present

## 2018-06-04 DIAGNOSIS — J9602 Acute respiratory failure with hypercapnia: Secondary | ICD-10-CM | POA: Diagnosis present

## 2018-06-04 DIAGNOSIS — R64 Cachexia: Secondary | ICD-10-CM | POA: Diagnosis present

## 2018-06-04 DIAGNOSIS — F0390 Unspecified dementia without behavioral disturbance: Secondary | ICD-10-CM | POA: Diagnosis present

## 2018-06-04 DIAGNOSIS — G9341 Metabolic encephalopathy: Secondary | ICD-10-CM | POA: Diagnosis not present

## 2018-06-04 DIAGNOSIS — I5042 Chronic combined systolic (congestive) and diastolic (congestive) heart failure: Secondary | ICD-10-CM | POA: Diagnosis present

## 2018-06-04 DIAGNOSIS — G40909 Epilepsy, unspecified, not intractable, without status epilepticus: Secondary | ICD-10-CM | POA: Diagnosis present

## 2018-06-04 DIAGNOSIS — G92 Toxic encephalopathy: Secondary | ICD-10-CM | POA: Diagnosis present

## 2018-06-04 DIAGNOSIS — R569 Unspecified convulsions: Secondary | ICD-10-CM | POA: Diagnosis present

## 2018-06-04 DIAGNOSIS — N183 Chronic kidney disease, stage 3 (moderate): Secondary | ICD-10-CM | POA: Diagnosis present

## 2018-06-04 DIAGNOSIS — Z515 Encounter for palliative care: Secondary | ICD-10-CM

## 2018-06-04 DIAGNOSIS — F0391 Unspecified dementia with behavioral disturbance: Secondary | ICD-10-CM

## 2018-06-04 DIAGNOSIS — R627 Adult failure to thrive: Secondary | ICD-10-CM

## 2018-06-04 DIAGNOSIS — I420 Dilated cardiomyopathy: Secondary | ICD-10-CM | POA: Diagnosis present

## 2018-06-04 DIAGNOSIS — R402112 Coma scale, eyes open, never, at arrival to emergency department: Secondary | ICD-10-CM | POA: Diagnosis present

## 2018-06-04 DIAGNOSIS — R34 Anuria and oliguria: Secondary | ICD-10-CM | POA: Diagnosis present

## 2018-06-04 DIAGNOSIS — E162 Hypoglycemia, unspecified: Secondary | ICD-10-CM | POA: Diagnosis not present

## 2018-06-04 DIAGNOSIS — Z8249 Family history of ischemic heart disease and other diseases of the circulatory system: Secondary | ICD-10-CM

## 2018-06-04 DIAGNOSIS — R402212 Coma scale, best verbal response, none, at arrival to emergency department: Secondary | ICD-10-CM | POA: Diagnosis present

## 2018-06-04 DIAGNOSIS — F17210 Nicotine dependence, cigarettes, uncomplicated: Secondary | ICD-10-CM | POA: Diagnosis present

## 2018-06-04 DIAGNOSIS — G4700 Insomnia, unspecified: Secondary | ICD-10-CM | POA: Diagnosis present

## 2018-06-04 DIAGNOSIS — Z8673 Personal history of transient ischemic attack (TIA), and cerebral infarction without residual deficits: Secondary | ICD-10-CM

## 2018-06-04 DIAGNOSIS — R739 Hyperglycemia, unspecified: Secondary | ICD-10-CM | POA: Diagnosis present

## 2018-06-04 DIAGNOSIS — Z9911 Dependence on respirator [ventilator] status: Secondary | ICD-10-CM

## 2018-06-04 DIAGNOSIS — J449 Chronic obstructive pulmonary disease, unspecified: Secondary | ICD-10-CM | POA: Diagnosis present

## 2018-06-04 DIAGNOSIS — J9600 Acute respiratory failure, unspecified whether with hypoxia or hypercapnia: Secondary | ICD-10-CM | POA: Diagnosis not present

## 2018-06-04 DIAGNOSIS — R531 Weakness: Secondary | ICD-10-CM

## 2018-06-04 DIAGNOSIS — Z681 Body mass index (BMI) 19 or less, adult: Secondary | ICD-10-CM | POA: Diagnosis not present

## 2018-06-04 DIAGNOSIS — J9690 Respiratory failure, unspecified, unspecified whether with hypoxia or hypercapnia: Secondary | ICD-10-CM

## 2018-06-04 HISTORY — DX: Unspecified convulsions: R56.9

## 2018-06-04 LAB — URINALYSIS, ROUTINE W REFLEX MICROSCOPIC
Bilirubin Urine: NEGATIVE
Glucose, UA: NEGATIVE mg/dL
Ketones, ur: NEGATIVE mg/dL
Leukocytes, UA: NEGATIVE
Nitrite: NEGATIVE
Protein, ur: 100 mg/dL — AB
Specific Gravity, Urine: 1.016 (ref 1.005–1.030)
pH: 5 (ref 5.0–8.0)

## 2018-06-04 LAB — CBC
HCT: 43.9 % (ref 36.0–46.0)
Hemoglobin: 13.6 g/dL (ref 12.0–15.0)
MCH: 29.6 pg (ref 26.0–34.0)
MCHC: 31 g/dL (ref 30.0–36.0)
MCV: 95.4 fL (ref 78.0–100.0)
Platelets: 289 10*3/uL (ref 150–400)
RBC: 4.6 MIL/uL (ref 3.87–5.11)
RDW: 12.9 % (ref 11.5–15.5)
WBC: 9 10*3/uL (ref 4.0–10.5)

## 2018-06-04 LAB — COMPREHENSIVE METABOLIC PANEL
ALT: 17 U/L (ref 0–44)
AST: 33 U/L (ref 15–41)
Albumin: 3.5 g/dL (ref 3.5–5.0)
Alkaline Phosphatase: 75 U/L (ref 38–126)
Anion gap: 14 (ref 5–15)
BUN: 18 mg/dL (ref 8–23)
CO2: 19 mmol/L — ABNORMAL LOW (ref 22–32)
Calcium: 9.3 mg/dL (ref 8.9–10.3)
Chloride: 108 mmol/L (ref 98–111)
Creatinine, Ser: 0.91 mg/dL (ref 0.44–1.00)
GFR calc Af Amer: >60 mL/min (ref >60)
GFR calc non Af Amer: 59 mL/min — ABNORMAL LOW (ref >60)
Glucose, Bld: 161 mg/dL — ABNORMAL HIGH (ref 70–99)
Potassium: 3.7 mmol/L (ref 3.5–5.1)
Sodium: 141 mmol/L (ref 135–145)
Total Bilirubin: 0.4 mg/dL (ref 0.3–1.2)
Total Protein: 7.3 g/dL (ref 6.5–8.1)

## 2018-06-04 LAB — I-STAT ARTERIAL BLOOD GAS, ED
Acid-base deficit: 3 mmol/L — ABNORMAL HIGH (ref 0.0–2.0)
Acid-base deficit: 3 mmol/L — ABNORMAL HIGH (ref 0.0–2.0)
Bicarbonate: 24.7 mmol/L (ref 20.0–28.0)
Bicarbonate: 21.8 mmol/L (ref 20.0–28.0)
O2 Saturation: 100 %
O2 Saturation: 99 %
Patient temperature: 98.6
Patient temperature: 98.6
TCO2: 26 mmol/L (ref 22–32)
TCO2: 23 mmol/L (ref 22–32)
pCO2 arterial: 56.7 mmHg — ABNORMAL HIGH (ref 32.0–48.0)
pCO2 arterial: 37.3 mmHg (ref 32.0–48.0)
pH, Arterial: 7.247 — ABNORMAL LOW (ref 7.350–7.450)
pH, Arterial: 7.375 (ref 7.350–7.450)
pO2, Arterial: 582 mmHg — ABNORMAL HIGH (ref 83.0–108.0)
pO2, Arterial: 156 mmHg — ABNORMAL HIGH (ref 83.0–108.0)

## 2018-06-04 LAB — I-STAT CHEM 8, ED
BUN: 21 mg/dL (ref 8–23)
Calcium, Ion: 1.23 mmol/L (ref 1.15–1.40)
Chloride: 108 mmol/L (ref 98–111)
Creatinine, Ser: 0.7 mg/dL (ref 0.44–1.00)
Glucose, Bld: 157 mg/dL — ABNORMAL HIGH (ref 70–99)
HCT: 43 % (ref 36.0–46.0)
Hemoglobin: 14.6 g/dL (ref 12.0–15.0)
Potassium: 3.6 mmol/L (ref 3.5–5.1)
Sodium: 142 mmol/L (ref 135–145)
TCO2: 21 mmol/L — ABNORMAL LOW (ref 22–32)

## 2018-06-04 LAB — RAPID URINE DRUG SCREEN, HOSP PERFORMED
Amphetamines: NOT DETECTED
Barbiturates: NOT DETECTED
Benzodiazepines: POSITIVE — AB
Cocaine: NOT DETECTED
Opiates: NOT DETECTED
Tetrahydrocannabinol: NOT DETECTED

## 2018-06-04 LAB — APTT: aPTT: 28 seconds (ref 24–36)

## 2018-06-04 LAB — DIFFERENTIAL
Abs Immature Granulocytes: 0 10*3/uL (ref 0.0–0.1)
Basophils Absolute: 0.1 10*3/uL (ref 0.0–0.1)
Basophils Relative: 1 %
Eosinophils Absolute: 0.1 10*3/uL (ref 0.0–0.7)
Eosinophils Relative: 1 %
Immature Granulocytes: 0 %
Lymphocytes Relative: 18 %
Lymphs Abs: 1.6 10*3/uL (ref 0.7–4.0)
Monocytes Absolute: 0.5 10*3/uL (ref 0.1–1.0)
Monocytes Relative: 6 %
Neutro Abs: 6.6 10*3/uL (ref 1.7–7.7)
Neutrophils Relative %: 74 %

## 2018-06-04 LAB — I-STAT TROPONIN, ED: Troponin i, poc: 0 ng/mL (ref 0.00–0.08)

## 2018-06-04 LAB — GLUCOSE, CAPILLARY
Glucose-Capillary: 80 mg/dL (ref 70–99)
Glucose-Capillary: 69 mg/dL — ABNORMAL LOW (ref 70–99)
Glucose-Capillary: 119 mg/dL — ABNORMAL HIGH (ref 70–99)
Glucose-Capillary: 47 mg/dL — ABNORMAL LOW (ref 70–99)
Glucose-Capillary: 95 mg/dL (ref 70–99)
Glucose-Capillary: 106 mg/dL — ABNORMAL HIGH (ref 70–99)
Glucose-Capillary: 148 mg/dL — ABNORMAL HIGH (ref 70–99)
Glucose-Capillary: 57 mg/dL — ABNORMAL LOW (ref 70–99)
Glucose-Capillary: 98 mg/dL (ref 70–99)
Glucose-Capillary: 79 mg/dL (ref 70–99)
Glucose-Capillary: 126 mg/dL — ABNORMAL HIGH (ref 70–99)

## 2018-06-04 LAB — MAGNESIUM: Magnesium: 1.8 mg/dL (ref 1.7–2.4)

## 2018-06-04 LAB — TROPONIN I
Troponin I: 0.03 ng/mL (ref <0.03)
Troponin I: 0.03 ng/mL (ref <0.03)
Troponin I: 0.04 ng/mL (ref <0.03)

## 2018-06-04 LAB — PROTIME-INR
INR: 1.17
Prothrombin Time: 14.8 seconds (ref 11.4–15.2)

## 2018-06-04 LAB — AMMONIA: Ammonia: 42 umol/L — ABNORMAL HIGH (ref 9–35)

## 2018-06-04 LAB — LACTIC ACID, PLASMA: Lactic Acid, Venous: 1.5 mmol/L (ref 0.5–1.9)

## 2018-06-04 LAB — CBG MONITORING, ED: Glucose-Capillary: 136 mg/dL — ABNORMAL HIGH (ref 70–99)

## 2018-06-04 LAB — ETHANOL: Alcohol, Ethyl (B): <10 mg/dL (ref <10)

## 2018-06-04 LAB — PHOSPHORUS: Phosphorus: 2.4 mg/dL — ABNORMAL LOW (ref 2.5–4.6)

## 2018-06-04 LAB — PROCALCITONIN: Procalcitonin: <0.1 ng/mL

## 2018-06-04 LAB — MRSA PCR SCREENING: MRSA by PCR: NEGATIVE

## 2018-06-04 MED ORDER — MIDAZOLAM HCL 2 MG/2ML IJ SOLN
INTRAMUSCULAR | Status: AC
  Administered 2018-06-04: 3 mg
  Filled 2018-06-04: qty 4

## 2018-06-04 MED ORDER — DEXTROSE 50 % IV SOLN
25.0000 g | Freq: Once | INTRAVENOUS | Status: AC
  Administered 2018-06-04: 25 g via INTRAVENOUS
  Filled 2018-06-04: qty 50

## 2018-06-04 MED ORDER — PROPOFOL 1000 MG/100ML IV EMUL: 5.0000 ug/kg/min | INTRAVENOUS | Status: DC

## 2018-06-04 MED ORDER — MIDAZOLAM HCL 2 MG/2ML IJ SOLN: 3.0000 mg | Freq: Once | INTRAMUSCULAR | Status: DC

## 2018-06-04 MED ORDER — VITAL AF 1.2 CAL PO LIQD
1000.0000 mL | ORAL | Status: DC
  Administered 2018-06-04 – 2018-06-06 (×3): 1000 mL
  Filled 2018-06-04: qty 1000

## 2018-06-04 MED ORDER — SODIUM CHLORIDE 0.9 % IV SOLN
INTRAVENOUS | Status: AC | PRN
  Administered 2018-06-04: 1000 mL via INTRAVENOUS

## 2018-06-04 MED ORDER — CHLORHEXIDINE GLUCONATE 0.12% ORAL RINSE (MEDLINE KIT)
15.0000 mL | Freq: Two times a day (BID) | OROMUCOSAL | Status: DC
  Administered 2018-06-04 – 2018-06-07 (×7): 15 mL via OROMUCOSAL

## 2018-06-04 MED ORDER — HEPARIN SODIUM (PORCINE) 5000 UNIT/ML IJ SOLN
5000.0000 [IU] | Freq: Three times a day (TID) | INTRAMUSCULAR | Status: DC
  Administered 2018-06-04 – 2018-06-15 (×24): 5000 [IU] via SUBCUTANEOUS
  Filled 2018-06-04 (×32): qty 1

## 2018-06-04 MED ORDER — DEXTROSE 50 % IV SOLN
25.0000 g | Freq: Once | INTRAVENOUS | Status: AC
  Administered 2018-06-04: 25 g via INTRAVENOUS

## 2018-06-04 MED ORDER — PROPOFOL 1000 MG/100ML IV EMUL
INTRAVENOUS | Status: AC
  Filled 2018-06-04: qty 100

## 2018-06-04 MED ORDER — LEVETIRACETAM IN NACL 1000 MG/100ML IV SOLN
1000.0000 mg | Freq: Once | INTRAVENOUS | Status: AC
  Administered 2018-06-04: 1000 mg via INTRAVENOUS
  Filled 2018-06-04: qty 100

## 2018-06-04 MED ORDER — ALBUTEROL SULFATE (2.5 MG/3ML) 0.083% IN NEBU
2.5000 mg | INHALATION_SOLUTION | RESPIRATORY_TRACT | Status: DC | PRN
  Administered 2018-06-07: 2.5 mg via RESPIRATORY_TRACT
  Filled 2018-06-04: qty 3

## 2018-06-04 MED ORDER — DEXTROSE 50 % IV SOLN
25.0000 g | Freq: Once | INTRAVENOUS | Status: DC
  Filled 2018-06-04: qty 50

## 2018-06-04 MED ORDER — FENTANYL 2500MCG IN NS 250ML (10MCG/ML) PREMIX INFUSION
25.0000 ug/h | INTRAVENOUS | Status: DC
  Administered 2018-06-04 – 2018-06-05 (×2): 50 ug/h via INTRAVENOUS
  Filled 2018-06-04 (×2): qty 250

## 2018-06-04 MED ORDER — ORAL CARE MOUTH RINSE
15.0000 mL | OROMUCOSAL | Status: DC
  Administered 2018-06-04 – 2018-06-07 (×32): 15 mL via OROMUCOSAL

## 2018-06-04 MED ORDER — HYDRALAZINE HCL 20 MG/ML IJ SOLN
10.0000 mg | Freq: Four times a day (QID) | INTRAMUSCULAR | Status: DC | PRN
  Administered 2018-06-04: 10 mg via INTRAVENOUS
  Filled 2018-06-04: qty 1

## 2018-06-04 MED ORDER — PANTOPRAZOLE SODIUM 40 MG PO PACK
40.0000 mg | PACK | Freq: Every day | ORAL | Status: DC
  Administered 2018-06-04 – 2018-06-07 (×4): 40 mg
  Filled 2018-06-04 (×4): qty 20

## 2018-06-04 MED ORDER — SUCCINYLCHOLINE CHLORIDE 20 MG/ML IJ SOLN
INTRAMUSCULAR | Status: AC | PRN
  Administered 2018-06-04: 120 mg via INTRAVENOUS

## 2018-06-04 MED ORDER — INSULIN ASPART 100 UNIT/ML ~~LOC~~ SOLN
0.0000 [IU] | SUBCUTANEOUS | Status: DC
  Administered 2018-06-04: 2 [IU] via SUBCUTANEOUS

## 2018-06-04 MED ORDER — FENTANYL BOLUS VIA INFUSION
25.0000 ug | INTRAVENOUS | Status: DC | PRN
  Administered 2018-06-04 – 2018-06-07 (×6): 25 ug via INTRAVENOUS
  Filled 2018-06-04: qty 25

## 2018-06-04 MED ORDER — DEXTROSE 50 % IV SOLN
INTRAVENOUS | Status: AC
  Filled 2018-06-04: qty 50

## 2018-06-04 MED ORDER — CARVEDILOL 3.125 MG PO TABS
3.1250 mg | ORAL_TABLET | Freq: Two times a day (BID) | ORAL | Status: DC
  Administered 2018-06-04 – 2018-06-16 (×24): 3.125 mg via ORAL
  Filled 2018-06-04 (×24): qty 1

## 2018-06-04 MED ORDER — SODIUM CHLORIDE 0.9 % IV SOLN
250.0000 mL | INTRAVENOUS | Status: DC | PRN
  Administered 2018-06-05 – 2018-06-07 (×3): 250 mL via INTRAVENOUS

## 2018-06-04 MED ORDER — PROPOFOL 1000 MG/100ML IV EMUL
5.0000 ug/kg/min | Freq: Once | INTRAVENOUS | Status: DC
  Administered 2018-06-04: 10 ug/kg/min via INTRAVENOUS

## 2018-06-04 MED ORDER — ETOMIDATE 2 MG/ML IV SOLN
INTRAVENOUS | Status: AC | PRN
  Administered 2018-06-04: 20 mg via INTRAVENOUS

## 2018-06-04 NOTE — Procedures (Signed)
ELECTROENCEPHALOGRAM REPORT   Patient: Tammy [name]       Room #: 4N25C EEG No. ID: [id number] Age: 77 y.o.        Sex: female Referring Physician: Minette HeadlandAljishi Report Date:  06/04/2018        Interpreting Physician: Thana FarrEYNOLDS, Retia Cordle  History: Tammy FallingVirginia T [name] is an 77 y.o. female found unresponsive with seizures noted en route to hosppital  Medications:  Fentanyl, Coreg, Insulin, Protonix, Keppra, Propofol  Conditions of Recording:  This is a 21 channel routine scalp EEG performed with bipolar and monopolar montages arranged in accordance to the international 10/20 system of electrode placement. One channel was dedicated to EKG recording.  The patient is in the intubated and sedated state.  Description:  The background activity is slow and poorly organized.  It consists of a low voltage polymorphic delta activity that is diffusely distributed.  There is a superimposed beta activity that is often well organized and is noted frequently and bilaterally.   The patient is stimulated during the recording and is noted to show some background reactivity.  After stimulation background is noted to quicken to theta activity. No epileptiform activity is noted.   Hyperventilation and intermittent photic stimulation were not performe  IMPRESSION: This is an abnormal EEG secondary to general background slowing.  This finding may be seen with a diffuse disturbance that is etiologically nonspecific, but may include a metabolic encephalopathy, among other possibilities.  No epileptiform activity was noted.   The fast activity noted is likely a medication effect.   Thana FarrLeslie Kendel Pesnell, MD Neurology [phone] 06/04/2018, 6:13 PM

## 2018-06-04 NOTE — ED Provider Notes (Signed)
[facility] Provider Note   CSN: [id number] Arrival date & time: 06/04/18  0154     History   Chief Complaint Chief Complaint  Patient presents with  . Altered Mental Status   Level 5 caveat due to altered mental status HPI Tammy [name] is a 77 y.o. female.  The history is provided by a relative and the EMS personnel.  Altered Mental Status   This is a new problem. The problem has been rapidly worsening. Associated symptoms include seizures.  patient with history of dementia, COPD, CHF presents with altered mental status. The son who is at bedside reports patient has been having increasing confusion due to her dementia.  She may not have  been taking her medications correctly.  When he got home from work he noted the patient was acting confused and appeared to be unresponsive.  Pulse was checked and she had no pulse, CPR was started.  EMS arrived and patient had a pulse.  However the patient had 2 seizures in route No known history of seizures.  Son reports he does have a history of stroke.  She did have Versed in route Past Medical History:  Diagnosis Date  . CHF (congestive heart failure) (HCC)   . CKD (chronic kidney disease)   . COPD (chronic obstructive pulmonary disease) (HCC)   . Dementia   . Hypertension   . Insomnia     Patient Active Problem List   Diagnosis Date Noted  . Acute respiratory failure with hypoxia (HCC) 04/01/2018  . Acute CHF (congestive heart failure) (HCC) 03/29/2018  . CKD (chronic kidney disease) stage 2, GFR 60-89 ml/min 03/29/2018  . Complicated UTI (urinary tract infection)   . Acute confusional state 09/17/2016  . Rhabdomyolysis 07/02/2016  . Fall at home 07/01/2016  . Lactic acid acidosis 12/01/2015  . Metabolic acidosis 12/01/2015  . Protein-calorie malnutrition, severe (HCC) 12/01/2015  . AKI (acute kidney injury) (HCC)   . Hyponatremia   . Hypothermia   . Tachycardia   . Hyperkalemia  11/29/2015  . Acute renal failure (HCC) 11/29/2015  . Chronic combined systolic and diastolic CHF, 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/2015  . Mitral regurgitation 11/29/2015  . Elevated lactic acid level 11/29/2015  . Agitation 11/29/2015  . COPD (chronic obstructive pulmonary disease) (HCC) 11/29/2015  . Dementia 11/29/2015  . Confusion   . Acute blood loss anemia 08/21/2015  . Postoperative anemia due to acute blood loss 08/20/2015  . Displaced intertrochanteric fracture of left femur (HCC) 08/19/2015  . Closed left hip fracture (HCC) 08/18/2015  . Secondary cardiomyopathy (HCC)   . Acute systolic congestive heart failure, NYHA class 3 (HCC) 03/02/2015  . COPD exacerbation (HCC) 03/02/2015  . Tobacco abuse 03/02/2015  . Severe protein-calorie malnutrition (HCC) 03/02/2015  . HTN (hypertension) 03/02/2015  . Congestive dilated cardiomyopathy (HCC)   . CVA (cerebral infarction) 02/28/2015  . Stroke [facility](HCC)     Past Surgical History:  Procedure Laterality Date  . INTRAMEDULLARY (IM) NAIL INTERTROCHANTERIC Left 08/19/2015   Procedure: INTRAMEDULLARY (IM) NAIL INTERTROCHANTRIC;  Surgeon: Jodi GeraldsJohn Graves, MD;  Location: [facility] OR;  Service: Orthopedics;  Laterality: Left;     OB History   None      Home Medications    Prior to Admission medications   Medication Sig Start Date End Date Taking? Authorizing Provider  carvedilol (COREG) 3.125 MG tablet Take 1 tablet (3.125 mg total) by mouth 2 (two) times daily with a meal. 03/04/15   Rinehuls, Onalee Huaavid  L, PA-C  feeding supplement, ENSURE ENLIVE, (ENSURE ENLIVE) LIQD Take 237 mLs by mouth 3 (three) times daily between meals. 12/01/15   Dhungel, Theda Belfast, MD  furosemide (LASIX) 40 MG tablet Take 1 tablet (40 mg total) by mouth daily. 04/02/18   Tyrone Nine, MD  lisinopril (PRINIVIL,ZESTRIL) 2.5 MG tablet Take 2.5 mg by mouth daily. 09/13/16   [provider]  pantoprazole (PROTONIX) 40 MG tablet Take 40 mg by mouth daily. 09/13/16   [provider]  sertraline (ZOLOFT) 50 MG tablet Take 50 mg by mouth daily. 09/13/16   [provider]  traZODone (DESYREL) 50 MG tablet Take 50 mg by mouth at bedtime as needed for sleep. 09/13/16   [provider]    Family History Family History  Problem Relation Age of Onset  . Congestive Heart Failure Son 5    Social History Social History   Tobacco Use  . Smoking status: Current Every Day Smoker    Packs/day: 2.00    Years: 50.00    Pack years: 100.00    Types: Cigarettes  . Smokeless tobacco: Never Used  Substance Use Topics  . Alcohol use: No  . Drug use: No     Allergies   Patient has no known allergies.   Review of Systems Review of Systems  Unable to perform ROS: Mental status change  Neurological: Positive for seizures.     Physical Exam Updated Vital Signs BP (!) 188/94   Pulse 81   Temp (S) 98.1 F (36.7 C) (Rectal)   Resp 18   Ht 1.524 m (5')   Wt 50 kg   SpO2 100%   BMI 21.53 kg/m   Physical Exam  CONSTITUTIONAL: Unresponsive HEAD: Normocephalic/atraumatic EYES: Pupils pinpoint bilaterally, no nystagmus ENMT: Oral airway in mouth by EMS NECK: supple no meningeal signs SPINE/BACK:entire spine nontender CV: S1/S2 noted, no murmurs/rubs/gallops noted LUNGS: Coarse breath sounds bilaterally, tachypnea noted shallow breathing noted ABDOMEN: soft, nondistended GU: Wearing diaper NEURO: Pt is unresponsive.  GCS 15 EXTREMITIES: pulses normal/equal, full ROM SKIN: warm, color normal PSYCH: unable to assess  ED Treatments / Results  Labs (all labs ordered are listed, but only abnormal results are displayed) Labs Reviewed  COMPREHENSIVE METABOLIC PANEL - Abnormal; Notable for the following components:      Result Value   CO2 19 (*)    Glucose, Bld 161 (*)    GFR calc non Af Amer 59 (*)    All other components within normal limits  RAPID URINE DRUG SCREEN, HOSP PERFORMED - Abnormal; Notable for the following  components:   Benzodiazepines POSITIVE (*)    All other components within normal limits  URINALYSIS, ROUTINE W REFLEX MICROSCOPIC - Abnormal; Notable for the following components:   APPearance HAZY (*)    Hgb urine dipstick MODERATE (*)    Protein, ur 100 (*)    Bacteria, UA RARE (*)    All other components within normal limits  I-STAT CHEM 8, ED - Abnormal; Notable for the following components:   Glucose, Bld 157 (*)    TCO2 21 (*)    All other components within normal limits  CBG MONITORING, ED - Abnormal; Notable for the following components:   Glucose-Capillary 136 (*)    All other components within normal limits  I-STAT ARTERIAL BLOOD GAS, ED - Abnormal; Notable for the following components:   pH, Arterial 7.247 (*)    pCO2 arterial 56.7 (*)    pO2, Arterial 582.0 (*)  Acid-base deficit 3.0 (*)    All other components within normal limits  URINE CULTURE  CULTURE, BLOOD (ROUTINE X 2)  CULTURE, BLOOD (ROUTINE X 2)  CULTURE, RESPIRATORY  ETHANOL  PROTIME-INR  APTT  CBC  DIFFERENTIAL  LACTIC ACID, PLASMA  LACTIC ACID, PLASMA  PROCALCITONIN  AMMONIA  TROPONIN I  TROPONIN I  TROPONIN I  I-STAT TROPONIN, ED    EKG EKG Interpretation  Date/Time:  Friday June 04 2018 02:05:21 EDT Ventricular Rate:  125 PR Interval:    QRS Duration: 73 QT Interval:  293 QTC Calculation: 423 R Axis:   79 Text Interpretation:  Sinus tachycardia Atrial premature complex Probable left atrial enlargement LVH with secondary repolarization abnormality ST depr, consider ischemia, inferior leads Abnormal ekg Confirmed by Zadie Rhine ([id number]) on 06/04/2018 2:20:03 AM   Radiology Ct Head Wo Contrast  Result Date: 06/04/2018 CLINICAL DATA:  77 year old female with altered mental status. EXAM: CT HEAD WITHOUT CONTRAST TECHNIQUE: Contiguous axial images were obtained from the base of the skull through the vertex without intravenous contrast. COMPARISON:  Head CT dated 06/11/2016 FINDINGS:  Brain: The ventricles and sulci appropriate size for patient's age. Mild periventricular and deep white matter chronic microvascular ischemic changes noted. There is no acute intracranial hemorrhage. No mass effect midline shift. No extra-axial fluid collection. Vascular: No hyperdense vessel or unexpected calcification. Skull: Normal. Negative for fracture or focal lesion. Sinuses/Orbits: Mild mucoperiosteal thickening of paranasal sinuses. No air-fluid levels. The mastoid air cells are clear. There is opacification of the nasopharynx. Other: Partially visualized endotracheal and enteric tubes. IMPRESSION: 1. No acute intracranial pathology. 2. Mild chronic microvascular ischemic changes. Electronically Signed   By: Elgie Collard M.D.   On: 06/04/2018 02:57   Dg Chest Portable 1 View  Result Date: 06/04/2018 CLINICAL DATA:  77 y/o  F; post intubation. EXAM: PORTABLE CHEST 1 VIEW COMPARISON:  03/29/2018 chest radiograph FINDINGS: Stable cardiomegaly given projection and technique. Aortic atherosclerosis with calcification. Endotracheal tube tip projects 4.9 cm above the carina. Enteric tube tip extends below the field of view into the abdomen. Hyperinflated lungs. No consolidation, effusion, or pneumothorax. No acute osseous abnormality is evident. IMPRESSION: 1. Endotracheal tube tip projects 4.9 cm above the carina. Enteric tube tip extends below the field of view into the abdomen. 2. Stable cardiomegaly and aortic atherosclerosis. 3. Hyperinflated lungs.  No focal consolidation. Electronically Signed   By: Mitzi Hansen M.D.   On: 06/04/2018 02:27    Procedures Procedure Name: Intubation Date/Time: 06/04/2018 2:20 AM Performed by: Zadie Rhine, MD Pre-anesthesia Checklist: Patient identified Oxygen Delivery Method: Non-rebreather mask Preoxygenation: Pre-oxygenation with 100% oxygen Induction Type: Rapid sequence Ventilation: Mask ventilation without difficulty Laryngoscope Size:  Glidescope Grade View: Grade I Tube size: 7.0 mm Number of attempts: 1 Placement Confirmation: ETT inserted through vocal cords under direct vision,  CO2 detector and Breath sounds checked- equal and bilateral       CRITICAL CARE Performed by: Joya Gaskins Total critical care time: 40 minutes Critical care time was exclusive of separately billable procedures and treating other patients. Critical care was necessary to treat or prevent imminent or life-threatening deterioration. Critical care was time spent personally by me on the following activities: development of treatment plan with patient and/or surrogate as well as nursing, discussions with consultants, evaluation of patient's response to treatment, examination of patient, obtaining history from patient or surrogate, ordering and performing treatments and interventions, ordering and review of laboratory studies, ordering and review of radiographic studies,  pulse oximetry and re-evaluation of patient's condition.  Medications Ordered in ED Medications  0.9 %  sodium chloride infusion (has no administration in time range)  heparin injection 5,000 Units (has no administration in time range)  pantoprazole sodium (PROTONIX) 40 mg/20 mL oral suspension 40 mg (has no administration in time range)  levETIRAcetam (KEPPRA) IVPB 1000 mg/100 mL premix (has no administration in time range)  insulin aspart (novoLOG) injection 0-15 Units (has no administration in time range)  etomidate (AMIDATE) injection (20 mg Intravenous Given 06/04/18 0202)  succinylcholine (ANECTINE) injection (120 mg Intravenous Given 06/04/18 0203)  0.9 %  sodium chloride infusion (1,000 mLs Intravenous New Bag/Given 06/04/18 0203)  propofol (DIPRIVAN) 1000 MG/100ML infusion (35 mcg/kg/min Intravenous Rate/Dose Verify 06/04/18 0308)     Initial Impression / Assessment and Plan / ED Course  I have reviewed the triage vital signs and the nursing notes.  Pertinent labs &  imaging results that were available during my care of the patient were reviewed by me and considered in my medical decision making (see chart for details).     2:59 AM Seen after arrival for altered mental status and potential seizures.  Unclear etiology of new onset seizures.  Patient was tachypneic with shallow breathing, concern for failure to maintain airway.  Therefore I intubated patient.  Son who is the POA was at bedside, and reports she does not have a DNR/DNI Workup Pending at this time 4:00 AM CT head negative Patient resting comfortably at this time.  No change in mental status.  Updated son on plan.  Discussed with neurology, as well as critical care for admission.  Unclear cause of seizures.  Per son she has remote history of stroke, this could be related to seizures.  Final Clinical Impressions(s) / ED Diagnoses   Final diagnoses:  Seizure [facility])  Encephalopathy    ED Discharge Orders    None       Zadie Rhine, MD 06/04/18 0401

## 2018-06-04 NOTE — Progress Notes (Signed)
Initial Nutrition Assessment  DOCUMENTATION CODES:   Severe malnutrition in context of chronic illness  INTERVENTION:   Initiate TF via OG tube: - Start Vital AF 1.2 at 20 ml/hr and increase by 10 ml q 8 hours until goal rate of 40 ml/hr (960 ml/day) - liquid MVI as TF regimen does not meet 100% RDIs  Tube feeding regimen provides 1152 kcal, 72 grams of protein, and 778 ml of H2O.  Monitor magnesium, potassium, and phosphorus daily for at least 3 days, MD to replete as needed, as pt is at risk for refeeding syndrome given severe protein-calorie malnutrition.  NUTRITION DIAGNOSIS:   Severe Malnutrition related to chronic illness (COPD, dementia, CKD stage III, CHF) as evidenced by severe fat depletion, moderate muscle depletion, severe muscle depletion.  GOAL:   Patient will meet greater than or equal to 90% of their needs  MONITOR:   Vent status, Skin, TF tolerance, Weight trends, I & O's, Labs  REASON FOR ASSESSMENT:   Ventilator, Consult Enteral/tube feeding initiation and management  ASSESSMENT:   77 year old female who presented to the ED for seizures and AMS. PMH significant for dementia, COPD, CKD stage III, hypertension, prior CVA, and CHF. Pt not a candidate for TPA. Pt intubated in the ED.  Discussed pt with RN and during ICU rounds. Pt returning from MRI at time of RD assessment. No family present at bedside.  Per weight history in chart, pt's weight has been stable since 04/02/18. Prior to that, most recent recorded weight is from 12/02/16. Suspect pt with significant recent weight loss but unable to confirm at this time.  Patient is currently intubated on ventilator support. Pt with OG tube in stomach. MV: 6.5 L/min Temp (24hrs), Avg:98.3 F (36.8 C), Min:97 F (36.1 C), Max:100 F (37.8 C) BP: 138/92 MAP: 107  Propofol: none Fentanyl: 2.5 ml/hr  Medications reviewed and include: sliding scale Novolog q 4 hours, 40 mg Protonix daily  Labs reviewed.  CBG's: 119, 69, 80, 136  NUTRITION - FOCUSED PHYSICAL EXAM:    Most Recent Value  Orbital Region  Severe depletion  Upper Arm Region  Severe depletion  Thoracic and Lumbar Region  Severe depletion  Buccal Region  Unable to assess  Temple Region  Severe depletion  Clavicle Bone Region  Severe depletion  Clavicle and Acromion Bone Region  Severe depletion  Scapular Bone Region  Unable to assess  Dorsal Hand  Moderate depletion  Patellar Region  Severe depletion  Anterior Thigh Region  Severe depletion  Posterior Calf Region  Severe depletion  Edema (RD Assessment)  None  Hair  Reviewed [very little hair]  Eyes  Reviewed  Mouth  Unable to assess  Skin  Reviewed  Nails  Reviewed       Diet Order:   Diet Order            Diet NPO time specified  Diet effective now              EDUCATION NEEDS:   No education needs have been identified at this time  Skin:  Skin Assessment: Reviewed RN Assessment (ecchymosis to bilateral knees, abrasion to R knee)  Last BM:  PTA  Height:   Ht Readings from Last 1 Encounters:  06/04/18 5\' 3"  (1.6 m)    Weight:   Wt Readings from Last 1 Encounters:  06/04/18 49 kg    Ideal Body Weight:  52.27 kg  BMI:  Body mass index is 19.14 kg/m.  Estimated Nutritional Needs:  Kcal:  1126  Protein:  70-85 grams  Fluid:  >/= 1.3 L    Earma ReadingKate Jablonski Sachit Gilman, MS, RD, LDN Pager: [phone] Weekend/After Hours: [phone]

## 2018-06-04 NOTE — H&P (Addendum)
[name]  ZOX:[id number] DOB: 06-Apr-1941 DOA: 06/04/2018 PCP: Laurann Montana, MD    LOS: 0 days   Reason for Consult / Chief Complaint:  Encephalopathy   Consulting MD and date of consult:  Dr. 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 ED note reports that son had started chest compressions, however son denies this and states that patient was unresponsive but when EMS arrived they checked for a pulse and patient had a pulse however on the way to the ED she was noted to have 2 seizures. CT head negative for acute. Due to inability to protect airway with continued encephalopathy patient was intubated. Neurology was consulted and PCCM asked to admit.   Son states that for the last few months patient's dementia has worsened. She still is able to ambulate however unable to perform any ADLs independently.   Subjective    Assessment & Plan:   Acute Hypercarbic Respiratory Failure  Respiratory Insufficieny with acute encephalopathy  H/O COPD, Tobacco Dependence Plan  -Vent Support -Trend ABG/CXR > Rate Increased, Repeat ABG at 0500  -Albuterol PRN -VAP Bundle -Pulmonary Hygiene   Chronic Diastolic Heart Failure  -Recent ECHO with EF 25-30  H/O HTN, PVD -Cardiac Monitoring  -Maintain Systolic <180 -PRN Hydralazine  -Continue home Coreg   -Trend Troponin   Chronic Kidney Disease Stage 3  Plan  -Trend BMP  -Avoid Nephrotoxic Medications  -Trend LA   Hyperglycemia  Plan  -Trend Glucose  -SSI  Acute Encephalopathy due to metabolic derangements vs post-ictal state Seizure  H/O CVA, Depression, Dementia Plan  -Neurology consulted -RASS goal 0/-1 -Titrate Fentanyl gtt to  achieve RASS -1 gram Keppra now  -EEG and MRI pending  -PAN Culture   Best Practice / Goals of Care / Disposition.   DVT prophylaxis: Heparin  GI prophylaxis: Protonix  Diet: NPO Mobility:Bedrest Code Status: Limited Code, Intubation/Bipap only  Family Communication: Son updated at bedside   Disposition / Summary of Today's Plan 06/04/18   77 year old female with progressive decline with dementia presents to ED unresponsive, s/p seizure. Intubated. Neurology consulted.   Consultants: date of consult/date signed off (if applicable)/final recs  8/30 > PCCM admit   Procedures: ETT 8/30 >>  Significant Diagnostic Tests: CXR 8/30 > 1. Endotracheal tube tip projects 4.9 cm above the carina. Enteric tube tip extends below the field of view into the abdomen. 2. Stable cardiomegaly and aortic atherosclerosis. 3. Hyperinflated lungs.  No focal consolidation. CT Head 8/30 > No acute intracranial pathology. 2. Mild chronic microvascular ischemic changes.  Micro Data: Blood 8/30 >> U/A 8/30 >> Sputum 8/30 >>  Antimicrobials:  N/A    Objective    Examination: General: Thin elderly female on vent  HENT: ETT in place  Lungs: Clear breath sounds, no wheeze  Cardiovascular: RRR, no MRG Abdomen: Soft, active bowel sounds  Extremities: -edema  Neuro: +gag/cough, does not follow commands, pupils 2 mm and reactive  Skin: Warm, dry, intact   Blood pressure (!) 168/77, pulse 81, temperature (!) 97 F (36.1 C), resp. rate 18, height 5' (1.524 m), weight 50 kg, SpO2 100 %.    Vent Mode: PRVC FiO2 (%):  [  40 %-100 %] 40 % Set Rate:  [18 bmp-26 bmp] 26 bmp Vt Set:  [420 mL] 420 mL PEEP:  [5 cmH20] 5 cmH20 Plateau Pressure:  [17 cmH20] 17 cmH20   Intake/Output Summary (Last 24 hours) at 06/04/2018 0402 Last data filed at 06/04/2018 0308 Gross per 24 hour  Intake 1011.08 ml  Output -  Net 1011.08 ml   Filed Weights   06/04/18 0213 06/04/18 0218 06/04/18 0245  Weight: 50 kg 50 kg  50 kg     Labs    CBC: Recent Labs  Lab 06/04/18 0208 06/04/18 0222  WBC 9.0  --   NEUTROABS 6.6  --   HGB 13.6 14.6  HCT 43.9 43.0  MCV 95.4  --   PLT 289  --    Basic Metabolic Panel: Recent Labs  Lab 06/04/18 0208 06/04/18 0222  NA 141 142  K 3.7 3.6  CL 108 108  CO2 19*  --   GLUCOSE 161* 157*  BUN 18 21  CREATININE 0.91 0.70  CALCIUM 9.3  --    GFR: Estimated Creatinine Clearance: 41.6 mL/min (by C-G formula based on SCr of 0.7 mg/dL). Recent Labs  Lab 06/04/18 0208  WBC 9.0   Liver Function Tests: Recent Labs  Lab 06/04/18 0208  AST 33  ALT 17  ALKPHOS 75  BILITOT 0.4  PROT 7.3  ALBUMIN 3.5   No results for input(s): LIPASE, AMYLASE in the last 168 hours. No results for input(s): AMMONIA in the last 168 hours. ABG    Component Value Date/Time   PHART 7.247 (L) 06/04/2018 0325   PCO2ART 56.7 (H) 06/04/2018 0325   PO2ART 582.0 (H) 06/04/2018 0325   HCO3 24.7 06/04/2018 0325   TCO2 26 06/04/2018 0325   ACIDBASEDEF 3.0 (H) 06/04/2018 0325   O2SAT 100.0 06/04/2018 0325    Coagulation Profile: Recent Labs  Lab 06/04/18 0208  INR 1.17   Cardiac Enzymes: No results for input(s): CKTOTAL, CKMB, CKMBINDEX, TROPONINI in the last 168 hours. HbA1C: Hgb A1c MFr Bld  Date/Time Value Ref Range Status  03/01/2015 02:18 AM 5.9 (H) 4.8 - 5.6 % Final    Comment:    (NOTE)         Pre-diabetes: 5.7 - 6.4         Diabetes: >6.4         Glycemic control for adults with diabetes: <7.0    CBG: Recent Labs  Lab 06/04/18 0217  GLUCAP 136*     Review of Systems:   Unable to review as patient is intubated and unresponsive   Past medical history  She,  has a past medical history of CHF (congestive heart failure) (HCC), CKD (chronic kidney disease), COPD (chronic obstructive pulmonary disease) (HCC), Dementia, Hypertension, and Insomnia.   Surgical History    Past Surgical History:  Procedure Laterality Date  . INTRAMEDULLARY (IM) NAIL  INTERTROCHANTERIC Left 08/19/2015   Procedure: INTRAMEDULLARY (IM) NAIL INTERTROCHANTRIC;  Surgeon: Jodi Geralds, MD;  Location: [facility] OR;  Service: Orthopedics;  Laterality: Left;     Social History   Social History   Socioeconomic History  . Marital status: Single    Spouse name: Not on file  . Number of children: Not on file  . Years of education: Not on file  . Highest education level: Not on file  Occupational History  . Occupation: retired  Engineer, production  . Financial resource strain: Not on file  . Food insecurity:    Worry: Not on  file    Inability: Not on file  . Transportation needs:    Medical: Not on file    Non-medical: Not on file  Tobacco Use  . Smoking status: Current Every Day Smoker    Packs/day: 2.00    Years: 50.00    Pack years: 100.00    Types: Cigarettes  . Smokeless tobacco: Never Used  Substance and Sexual Activity  . Alcohol use: No  . Drug use: No  . Sexual activity: Not on file  Lifestyle  . Physical activity:    Days per week: Not on file    Minutes per session: Not on file  . Stress: Not on file  Relationships  . Social connections:    Talks on phone: Not on file    Gets together: Not on file    Attends religious service: Not on file    Active member of club or organization: Not on file    Attends meetings of clubs or organizations: Not on file    Relationship status: Not on file  . Intimate partner violence:    Fear of current or ex partner: Not on file    Emotionally abused: Not on file    Physically abused: Not on file    Forced sexual activity: Not on file  Other Topics Concern  . Not on file  Social History Narrative  . Not on file  ,  reports that she has been smoking cigarettes. She has a 100.00 pack-year smoking history. She has never used smokeless tobacco. She reports that she does not drink alcohol or use drugs.   Family history   Her family history includes Congestive Heart Failure (age of onset: 5155) in her son.    Allergies No Known Allergies  Home meds  Prior to Admission medications   Medication Sig Start Date End Date Taking? Authorizing Provider  carvedilol (COREG) 3.125 MG tablet Take 1 tablet (3.125 mg total) by mouth 2 (two) times daily with a meal. 03/04/15   Rinehuls, Kinnie Scalesavid L, PA-C  feeding supplement, ENSURE ENLIVE, (ENSURE ENLIVE) LIQD Take 237 mLs by mouth 3 (three) times daily between meals. 12/01/15   Dhungel, Theda BelfastNishant, MD  furosemide (LASIX) 40 MG tablet Take 1 tablet (40 mg total) by mouth daily. 04/02/18   Tyrone NineGrunz, Ryan B, MD  lisinopril (PRINIVIL,ZESTRIL) 2.5 MG tablet Take 2.5 mg by mouth daily. 09/13/16   [provider]  pantoprazole (PROTONIX) 40 MG tablet Take 40 mg by mouth daily. 09/13/16   [provider]  sertraline (ZOLOFT) 50 MG tablet Take 50 mg by mouth daily. 09/13/16   [provider]  traZODone (DESYREL) 50 MG tablet Take 50 mg by mouth at bedtime as needed for sleep. 09/13/16   [provider]      Jovita KussmaulKatalina Eubanks, AGACNP-BC [facility]  Pgr: [phone]  PCCM Pgr: [phone]

## 2018-06-04 NOTE — Progress Notes (Signed)
Pt transported to MRI on vent, no complications  

## 2018-06-04 NOTE — Consult Note (Signed)
Neurology Consultation  Reason for Consult: Altered mental status, unresponsiveness, seizure Referring Physician: Dr. Bebe Shaggy  CC: Unresponsiveness, seizure  History is obtained from: Son, chart  HPI: [name] is a 77 y.o. female with a past medical history of chronic kidney disease, COPD-current smoker, advanced dementia, congestive heart failure, hypertension, history of prior stroke in 2016 with no residual deficits when she had presented initially with some confusion and weakness, was brought into the emergency room today via EMS when they were called to evaluate her for unresponsiveness at her house where she lives with a caregiver. The patient was in her house with a caregiver, last spoke to her son on the phone around 7 PM, also called him around 1:30 AM but did not speak with the son at that time and did not leave voicemail, was found to be unresponsive after having acted more confused than usual.  Per report, there was no pulse and CPR was started.  EMS arrived and the patient had pulse.  EMS noted patient having 2 seizures in route.  Patient has no history of seizures. According to the son, her dementia is "off the roof" recently.  She has also had unintentional weight loss.  She has had a lot of behavioral issues over the past few months to a year. When EMS noted the seizures, she was given Versed en route. She was unable to protect her airway and was intubated for airway protection in the emergency room. Critical care medicine was called for admitting the patient and neurological consultation was placed for the possible seizures, history of stroke and the current episode of unresponsiveness. It is unclear as to how long the patient was down for prior to CPR was initiated and how long after CPR was initiated did the patient have return of spontaneous circulation. It is also unclear if the patient was taking her medications properly.  Her blood pressures were very high on arrival  with systolics in the 200s.   LKW: 7 PM on 06/03/2018 tpa given?: no, side the window Premorbid modified Rankin scale (mRS): 4 (at baseline she uses a walker for most part, requires 24/7 care by caregiver but still lives at her own home)  ZOX:[id number] to obtain due to altered mental status.   Past Medical History:  Diagnosis Date  . CHF (congestive heart failure) (HCC)   . CKD (chronic kidney disease)   . COPD (chronic obstructive pulmonary disease) (HCC)   . Dementia   . Hypertension   . Insomnia     Family History  Problem Relation Age of Onset  . Congestive Heart Failure Son 32   Social History:   reports that she has been smoking cigarettes. She has a 100.00 pack-year smoking history. She has never used smokeless tobacco. She reports that she does not drink alcohol or use drugs.  Medications  Current Facility-Administered Medications:  .  0.9 %  sodium chloride infusion, 250 mL, Intravenous, PRN, Tobey Grim, NP .  heparin injection 5,000 Units, 5,000 Units, Subcutaneous, Q8H, Eubanks, Katalina M, NP .  pantoprazole sodium (PROTONIX) 40 mg/20 mL oral suspension 40 mg, 40 mg, Per Tube, Daily, Tobey Grim, NP  Current Outpatient Medications:  .  carvedilol (COREG) 3.125 MG tablet, Take 1 tablet (3.125 mg total) by mouth 2 (two) times daily with a meal., Disp: 60 tablet, Rfl: 2 .  feeding supplement, ENSURE ENLIVE, (ENSURE ENLIVE) LIQD, Take 237 mLs by mouth 3 (three) times daily between meals., Disp: 237 mL, Rfl:  12 .  furosemide (LASIX) 40 MG tablet, Take 1 tablet (40 mg total) by mouth daily., Disp: 30 tablet, Rfl: 0 .  lisinopril (PRINIVIL,ZESTRIL) 2.5 MG tablet, Take 2.5 mg by mouth daily., Disp: , Rfl:  .  pantoprazole (PROTONIX) 40 MG tablet, Take 40 mg by mouth daily., Disp: , Rfl:  .  sertraline (ZOLOFT) 50 MG tablet, Take 50 mg by mouth daily., Disp: , Rfl:  .  traZODone (DESYREL) 50 MG tablet, Take 50 mg by mouth at bedtime as needed for sleep., Disp:  , Rfl:    Exam: Current vital signs: BP (!) 168/77   Pulse 81   Temp (!) 97 F (36.1 C)   Resp 18   Ht 5' (1.524 m)   Wt 50 kg   SpO2 100%   BMI 21.53 kg/m  Vital signs in last 24 hours: Temp:  [97 F (36.1 C)-98.1 F (36.7 C)] 97 F (36.1 C) (08/30 0300) Pulse Rate:  [81-110] 81 (08/30 0200) Resp:  [18-28] 18 (08/30 0220) BP: (130-188)/(68-94) 168/77 (08/30 0300) SpO2:  [67 %-100 %] 100 % (08/30 0204) FiO2 (%):  [100 %] 100 % (08/30 0309) Weight:  [50 kg] 50 kg (08/30 0245) General: Cachectic looking woman, sedated on propofol, intubated. HEENT: Normocephalic atraumatic, and tracheal tube in place CVS: S1-S2 heard, regular rate rhythm Respiratory system: Chest is clear to auscultation Abdomen: Nondistended nontender Extremities: Chronic venous stasis changes in both lower extremities, no edema. Neurological exam Patient is sedated and intubated No response to voice or noxious stim. She is apparently intermittently chewing on her tube. Cranial nerves: Pupils are 2 mm very sluggishly reactive bilaterally, there is no gaze preference or deviation, corneals present, she is breathing over the vent, facial symmetry difficult to ascertain.  Motor exam: No spontaneous movement.  To noxious stimulus in both lower extremities there is triple flexion, there is slight flicker movement in the right upper extremity to noxious edematous, there is no movement to noxious stimulus in the left upper extremity. Sensory exam: As above Coordination and gait cannot be tested at this time She is hyporeflexic all over NIHSS 1a Level of Conscious.: 3 1b LOC Questions: 2 1c LOC Commands:2  2 Best Gaze: 0 3 Visual: 0 4 Facial Palsy:0  5a Motor Arm - left:3  5b Motor Arm - Righ: 4 6a Motor Leg - Left: 3 6b Motor Leg - Right:3  7 Limb Ataxia: 0 8 Sensory: 0 9 Best Language: 3 10 Dysarthria: un 11 Extinct. and Inatten.: 0 TOTAL: 23  Labs I have reviewed labs in epic and the results  pertinent to this consultation are: CBC    Component Value Date/Time   WBC 9.0 06/04/2018 0208   RBC 4.60 06/04/2018 0208   HGB 14.6 06/04/2018 0222   HCT 43.0 06/04/2018 0222   PLT 289 06/04/2018 0208   MCV 95.4 06/04/2018 0208   MCH 29.6 06/04/2018 0208   MCHC 31.0 06/04/2018 0208   RDW 12.9 06/04/2018 0208   LYMPHSABS 1.6 06/04/2018 0208   MONOABS 0.5 06/04/2018 0208   EOSABS 0.1 06/04/2018 0208   BASOSABS 0.1 06/04/2018 0208  CMP     Component Value Date/Time   NA 142 06/04/2018 0222   K 3.6 06/04/2018 0222   CL 108 06/04/2018 0222   CO2 19 (L) 06/04/2018 0208   GLUCOSE 157 (H) 06/04/2018 0222   BUN 21 06/04/2018 0222   CREATININE 0.70 06/04/2018 0222   CALCIUM 9.3 06/04/2018 0208   PROT 7.3 06/04/2018 0208  ALBUMIN 3.5 06/04/2018 0208   AST 33 06/04/2018 0208   ALT 17 06/04/2018 0208   ALKPHOS 75 06/04/2018 0208   BILITOT 0.4 06/04/2018 0208   GFRNONAA 59 (L) 06/04/2018 0208   GFRAA >60 06/04/2018 0208  Urinalysis negative U tox positive for benzos, likely Versed that was given for the seizure  Imaging I have reviewed the images obtained: CT-scan of the brain-shows no acute changes.  No evidence of bleed or evolving infarct.  Assessment:  77 year old woman past history of chronic kidney disease, COPD, advanced dementia, congestive heart failure, hypertension, history of prior stroke in 2016 with no residual deficits, brought in after she was found unresponsive at home.  This was preceded by some increasing confusion throughout the day.  CPR was done because she was probably pulseless but I have not been able to verify that as EMS had already left by the time.  Next line the patient lives at home with a caregiver.  She had last spoken to her son, who I spoke with at the bedside, at 7 PM.  She had attempted to call her son at some time around 1 AM but, the son said that she did not leave a message and he was not able to answer so did not speak with her. She was  brought in by EMS-and route had 2 seizures and received Versed for that. In the ER, she was intubated for airway protection. At this time, it is unclear as to the correct sequence of events. But on clinical exam, does not look like that she is actively seizing at this time. I suspect that she had a cardiac event or seizure leading to the unresponsiveness.  Based on the fact that there is no focality on the exam except for some left upper extremity weakness that might be possible, and based on the last known normal being 7 PM, she is not a candidate for IV TPA. She is a poor MRS to begin with, hence not a candidate for mechanical thrombectomy.  Impression -Questionable cardiac arrest-status post CPR -?  Anoxic brain injury/hypoxic ischemic encephalopathy -Witnessed seizure activity en route to the hospital-?  New onset seizure in the setting of worsening dementia. -Toxic metabolic encephalopathy -Of stroke, hypertension, COPD, chronic kidney disease and advanced dementia.  Recommendations: -Load with Keppra 1 g IV now -Routine EEG in the morning -Recommendations on continuing antiepileptics upon availability of EEG results as well as imaging findings and team rounds in the morning. -MRI of the brain without contrast when stable -Correction of toxic metabolic derangements per primary team as you are. I discussed the patient's philosophy of care and my plan with the pulmonary critical care nurse practitioner at the patient's bedside.  I also discussed my plan with the patient's son at bedside and answered his questions. Neurology service will continue to follow with you. -- Milon DikesAshish Mohannad Olivero, MD [facility] Pager: [phone] If 7pm to 7am, please call on call as listed on AMION.  CRITICAL CARE ATTESTATION This patient is critically ill and at significant risk of neurological worsening, death and care requires constant monitoring of vital signs, hemodynamics,respiratory and cardiac  monitoring. I spent 45  minutes of neurocritical care time performing neurological assessment, discussion with family, other specialists and medical decision making of high complexityin the care of  this patient.

## 2018-06-04 NOTE — Progress Notes (Signed)
CRITICAL VALUE STICKER  CRITICAL VALUE:  Troponin 0.03  RECEIVER (on-site recipient of call): P. Ayvion Kavanagh RN   DATE & TIME NOTIFIED: 06/04/18 0925  MESSENGER (representative from lab):  B.Earlene Plateravis  MD NOTIFIED: Dr. Denese KillingsAgarwala  TIME OF NOTIFICATION: 06/04/18 1000  RESPONSE: Serial Troponin order in place.

## 2018-06-04 NOTE — ED Triage Notes (Addendum)
Patient here by EMS for seizure x2 and increased altered mental status prior to the seizures.  EMS treated seizure activity with 2.5 mg Versed.  Family at bedside with patient.  Patient not responsive at this time, oral airway placed by EMS.

## 2018-06-04 NOTE — Progress Notes (Signed)
EEG completed after MRI. Results pending

## 2018-06-04 NOTE — Progress Notes (Signed)
Interim note  Patient was seen as a consult from my colleague Dr. Wilford CornerArora earlier this morning. Currently patient is intubated however moving all 4 extremities.  MRI brain showed no acute abnormality and EEG showed no epileptiform activity.  We will continue to follow.

## 2018-06-04 NOTE — ED Notes (Signed)
Notified respiratory regarding blood gas

## 2018-06-05 ENCOUNTER — Inpatient Hospital Stay (HOSPITAL_COMMUNITY): Payer: Medicare Other

## 2018-06-05 DIAGNOSIS — J9600 Acute respiratory failure, unspecified whether with hypoxia or hypercapnia: Secondary | ICD-10-CM

## 2018-06-05 DIAGNOSIS — R569 Unspecified convulsions: Secondary | ICD-10-CM

## 2018-06-05 DIAGNOSIS — J9690 Respiratory failure, unspecified, unspecified whether with hypoxia or hypercapnia: Secondary | ICD-10-CM

## 2018-06-05 LAB — CBC
HCT: 43.2 % (ref 36.0–46.0)
Hemoglobin: 13.6 g/dL (ref 12.0–15.0)
MCH: 29.3 pg (ref 26.0–34.0)
MCHC: 31.5 g/dL (ref 30.0–36.0)
MCV: 93.1 fL (ref 78.0–100.0)
Platelets: 255 10*3/uL (ref 150–400)
RBC: 4.64 MIL/uL (ref 3.87–5.11)
RDW: 13.1 % (ref 11.5–15.5)
WBC: 9.2 10*3/uL (ref 4.0–10.5)

## 2018-06-05 LAB — GLUCOSE, CAPILLARY
Glucose-Capillary: 59 mg/dL — ABNORMAL LOW (ref 70–99)
Glucose-Capillary: 94 mg/dL (ref 70–99)
Glucose-Capillary: 89 mg/dL (ref 70–99)
Glucose-Capillary: 74 mg/dL (ref 70–99)
Glucose-Capillary: 102 mg/dL — ABNORMAL HIGH (ref 70–99)
Glucose-Capillary: 114 mg/dL — ABNORMAL HIGH (ref 70–99)
Glucose-Capillary: 117 mg/dL — ABNORMAL HIGH (ref 70–99)

## 2018-06-05 LAB — BASIC METABOLIC PANEL
Anion gap: 7 (ref 5–15)
BUN: 17 mg/dL (ref 8–23)
CO2: 25 mmol/L (ref 22–32)
Calcium: 9.1 mg/dL (ref 8.9–10.3)
Chloride: 109 mmol/L (ref 98–111)
Creatinine, Ser: 0.88 mg/dL (ref 0.44–1.00)
GFR calc Af Amer: >60 mL/min (ref >60)
GFR calc non Af Amer: >60 mL/min (ref >60)
Glucose, Bld: 96 mg/dL (ref 70–99)
Potassium: 3.9 mmol/L (ref 3.5–5.1)
Sodium: 141 mmol/L (ref 135–145)

## 2018-06-05 LAB — URINE CULTURE: Culture: <10000 — AB

## 2018-06-05 LAB — PHOSPHORUS
Phosphorus: 3.1 mg/dL (ref 2.5–4.6)
Phosphorus: 2.7 mg/dL (ref 2.5–4.6)

## 2018-06-05 LAB — PROCALCITONIN: Procalcitonin: <0.1 ng/mL

## 2018-06-05 LAB — MAGNESIUM
Magnesium: 1.7 mg/dL (ref 1.7–2.4)
Magnesium: 1.7 mg/dL (ref 1.7–2.4)

## 2018-06-05 MED ORDER — ACETAMINOPHEN 160 MG/5ML PO SOLN
325.0000 mg | Freq: Four times a day (QID) | ORAL | Status: DC | PRN
  Administered 2018-06-05 – 2018-06-06 (×2): 325 mg
  Filled 2018-06-05 (×2): qty 20.3

## 2018-06-05 MED ORDER — SODIUM CHLORIDE 0.9 % IV SOLN
250.0000 mg | Freq: Two times a day (BID) | INTRAVENOUS | Status: DC
  Administered 2018-06-05 – 2018-06-08 (×6): 250 mg via INTRAVENOUS
  Filled 2018-06-05 (×6): qty 2.5

## 2018-06-05 MED ORDER — SODIUM CHLORIDE 0.9 % IV BOLUS: 500.0000 mL | Freq: Once | INTRAVENOUS | Status: DC

## 2018-06-05 MED ORDER — DEXTROSE 50 % IV SOLN
25.0000 mL | Freq: Once | INTRAVENOUS | Status: AC
  Administered 2018-06-05: 25 mL via INTRAVENOUS
  Filled 2018-06-05: qty 50

## 2018-06-05 MED ORDER — SODIUM CHLORIDE 0.9 % IV BOLUS
500.0000 mL | Freq: Once | INTRAVENOUS | Status: AC
  Administered 2018-06-05: 500 mL via INTRAVENOUS

## 2018-06-05 NOTE — H&P (Signed)
[name]  ZOX:[id number] DOB: 1941-05-03 DOA: 06/04/2018 PCP: Laurann Montana, MD    LOS: 1 day   Reason for Consult / Chief Complaint:  Encephalopathy   Consulting MD and date of consult:  Dr. 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 ED note reports that son had started chest compressions, however son denies this and states that patient was unresponsive but when EMS arrived they checked for a pulse and patient had a pulse however on the way to the ED she was noted to have 2 seizures. CT head negative for acute. Due to inability to protect airway with continued encephalopathy patient was intubated. Neurology was consulted and PCCM asked to admit.   Son states that for the last few months patient's dementia has worsened. She still is able to ambulate however unable to perform any ADLs independently.   Subjective  MRI done yesterday w/ no acute process  EEG w/ no seizure act .  No seizure act since admit  Labs pending this am  Increased alertness this am, opens eyes, f/c intermittently  Low UOP  Low BS , required D50 x 1 overnight .    Assessment & Plan:   Acute Hypercarbic Respiratory Failure  Respiratory Insufficieny with acute encephalopathy  H/O COPD, Tobacco Dependence Plan  -Vent Support -Trend ABG/CXR   -Albuterol PRN -VAP Bundle -Pulmonary Hygiene  -Assess for daily SBT /wean   Chronic Diastolic Heart Failure  -Recent ECHO with EF 25-30  -troponin mild bump suspect demand  H/O HTN, PVD -Cardiac Monitoring  -Maintain Systolic <180 -PRN Hydralazine  -Continue home Coreg   -Trend Troponin   Chronic Kidney Disease Stage 3  Plan  -Trend BMP  -Avoid Nephrotoxic  Medications  -Trend LA   Hyperglycemia -resolved  Hypoglycemia  Plan  -Trend Glucose  -D/c -SSI -increase TF to goal rate   Acute Encephalopathy due to metabolic derangements vs post-ictal state Seizure  H/O CVA, Depression, Dementia 8/30 MRI -NAD, EEG no seizure act  Plan  -Neurology following  -RASS goal 0/-1 -Fentanyl As needed  -limit sedation as able  -s/p  Keppra load  -PAN Culture   Fever ? Etiology  Plan  -follow cx data    Best Practice / Goals of Care / Disposition.   DVT prophylaxis: Heparin  GI prophylaxis: Protonix  Diet: NPO/TF  Mobility:Bedrest Code Status: Limited Code, Intubation/Bipap only  Family Communication: Son updated at bedside 8/31  Disposition / Summary of Today's Plan 06/05/18   77 year old female with progressive decline with dementia presents to ED unresponsive, s/p seizure. Intubated. Neurology consulted. MRI and EEG unrevealing . Increased alertness this am . Assess for weaning today .   Consultants: date of consult/date signed off (if applicable)/final recs  8/30 > PCCM admit   Procedures: ETT 8/30 >>  Significant Diagnostic Tests: CXR 8/30 > 1. Endotracheal tube tip projects 4.9 cm above the carina. Enteric tube tip extends below the field of view into the abdomen. 2. Stable cardiomegaly and aortic atherosclerosis. 3. Hyperinflated lungs.  No focal consolidation. CT Head 8/30 > No acute intracranial pathology. 2. Mild chronic microvascular ischemic changes. MRI 8/30 >nad  EEG 8/30 >>no seizure act  Micro Data: Blood 8/30 >> U/A 8/30 >> Sputum 8/30 >>  Antimicrobials:  N/A    Objective    Examination: General: Thin cachectic female on the vent  HENT: ETT in place Lungs: Clear breath sounds with diminished breath sounds in the bases Cardiovascular: Regular rate and rhythm Abdomen: Soft positive bowel sounds Extremities: No edema Neuro: Increased alertness, followed simple commands intermittently  Skin: Intact  without rash  Blood pressure (!) 107/48, pulse 95, temperature 99.9 F (37.7 C), resp. rate (!) 23, height 5\' 3"  (1.6 m), weight 41.3 kg, SpO2 92 %.    Vent Mode: PRVC FiO2 (%):  [40 %] 40 % Set Rate:  [26 bmp] 26 bmp Vt Set:  [420 mL] 420 mL PEEP:  [5 cmH20] 5 cmH20 Plateau Pressure:  [16 cmH20-19 cmH20] 17 cmH20   Intake/Output Summary (Last 24 hours) at 06/05/2018 0736 Last data filed at 06/05/2018 0700 Gross per 24 hour  Intake 1072.71 ml  Output 663 ml  Net 409.71 ml   Filed Weights   06/04/18 0245 06/04/18 0516 06/05/18 0415  Weight: 50 kg 49 kg 41.3 kg     Labs    CBC: Recent Labs  Lab 06/04/18 0208 06/04/18 0222  WBC 9.0  --   NEUTROABS 6.6  --   HGB 13.6 14.6  HCT 43.9 43.0  MCV 95.4  --   PLT 289  --    Basic Metabolic Panel: Recent Labs  Lab 06/04/18 0208 06/04/18 0222 06/04/18 1424  NA 141 142  --   K 3.7 3.6  --   CL 108 108  --   CO2 19*  --   --   GLUCOSE 161* 157*  --   BUN 18 21  --   CREATININE 0.91 0.70  --   CALCIUM 9.3  --   --   MG  --   --  1.8  PHOS  --   --  2.4*   GFR: Estimated Creatinine Clearance: 37.8 mL/min (by C-G formula based on SCr of 0.7 mg/dL). Recent Labs  Lab 06/04/18 0208 06/04/18 0350  PROCALCITON  --  <0.10  WBC 9.0  --   LATICACIDVEN  --  1.5   Liver Function Tests: Recent Labs  Lab 06/04/18 0208  AST 33  ALT 17  ALKPHOS 75  BILITOT 0.4  PROT 7.3  ALBUMIN 3.5   No results for input(s): LIPASE, AMYLASE in the last 168 hours. Recent Labs  Lab 06/04/18 0350  AMMONIA 42*   ABG    Component Value Date/Time   PHART 7.375 06/04/2018 0447   PCO2ART 37.3 06/04/2018 0447   PO2ART 156.0 (H) 06/04/2018 0447   HCO3 21.8 06/04/2018 0447   TCO2 23 06/04/2018 0447   ACIDBASEDEF 3.0 (H) 06/04/2018 0447   O2SAT 99.0 06/04/2018 0447    Coagulation Profile: Recent Labs  Lab 06/04/18 0208  INR 1.17   Cardiac Enzymes: Recent Labs  Lab 06/04/18 0751 06/04/18 1424 06/04/18 1935  TROPONINI 0.03*  0.03* 0.04*   HbA1C: Hgb A1c MFr Bld  Date/Time Value Ref Range Status  03/01/2015 02:18 AM 5.9 (H) 4.8 - 5.6 % Final    Comment:    (NOTE)         Pre-diabetes: 5.7 - 6.4         Diabetes: >6.4         Glycemic control for adults with diabetes: <7.0    CBG: Recent Labs  Lab 06/04/18 1849 06/04/18 2025 06/04/18 2328 06/05/18 0328  06/05/18 0425  GLUCAP 98 79 126* 59* 94    [facility]  [phone]   06/05/2018

## 2018-06-05 NOTE — Progress Notes (Signed)
E-Link MD made aware of patient's low urine output and low volume on bladder scan. MD made aware of current fluids and rate. MD made aware of patient's history of systolic heart failure and stage 3 CKD. New orders received, will continue to monitor.

## 2018-06-05 NOTE — Progress Notes (Signed)
Reason for consult: Seizures  Subjective: Patient is still intubated, however is alert and following basic commands intermittently.    ROS: Unable to obtain due to poor mental status  Examination  Vital signs in last 24 hours: Temp:  [99.1 F (37.3 C)-100.9 F (38.3 C)] 100 F (37.8 C) (08/31 1700) Pulse Rate:  [61-129] 68 (08/31 1700) Resp:  [13-31] 26 (08/31 1700) BP: (78-153)/(43-136) 123/44 (08/31 1700) SpO2:  [92 %-100 %] 100 % (08/31 1700) FiO2 (%):  [40 %] 40 % (08/31 1547) Weight:  [41.3 kg] 41.3 kg (08/31 0415)  General: Not in distress CVS: pulse-normal rate and rhythm RS: breathing comfortably Extremities: normal   Neuro: MS: Alert, closed eyes to command raised both upper extremities to command.   CN: pupils equal and reactive,  EOMI, face symmetric Motor: Moves all 4 extremities with good strength Gait: not tested  Basic Metabolic Panel: Recent Labs  Lab 06/04/18 0208 06/04/18 0222 06/04/18 1424 06/05/18 0635 06/05/18 1641  NA 141 142  --  141  --   K 3.7 3.6  --  3.9  --   CL 108 108  --  109  --   CO2 19*  --   --  25  --   GLUCOSE 161* 157*  --  96  --   BUN 18 21  --  17  --   CREATININE 0.91 0.70  --  0.88  --   CALCIUM 9.3  --   --  9.1  --   MG  --   --  1.8 1.7 1.7  PHOS  --   --  2.4* 3.1 2.7    CBC: Recent Labs  Lab 06/04/18 0208 06/04/18 0222 06/05/18 0635  WBC 9.0  --  9.2  NEUTROABS 6.6  --   --   HGB 13.6 14.6 13.6  HCT 43.9 43.0 43.2  MCV 95.4  --  93.1  PLT 289  --  255     Coagulation Studies: Recent Labs    06/04/18 0208  LABPROT 14.8  INR 1.17    Imaging Reviewed:     ASSESSMENT AND PLAN 77 year old woman past history of chronic kidney disease, COPD, advanced dementia, congestive heart failure, hypertension, history of prior stroke in 2016 with no residual deficits, brought in after she was found unresponsive at home. Found to pulseless by ED. Noted to have 2 seizures prior to arrival.  Intubated in the  ER loaded with Keppra.  MRI brain was negative for acute stroke and EEG following morning showed no epileptiform activity.  Seizure possibly provoked in the setting of hypoxia, however patient has advanced dementia and increased risk of seizures.  Possible Cardiac arrest  Seizures  Seizure possibly provoked in the setting of hypoxia, however patient has advanced dementia and increased risk of seizures Will start patient on Keppra 250mg  BID Extubate as tolerated     Neurology will sign off.       Georgiana SpinnerSushanth Aroor [facility] Pager Number [phone] For questions after 7pm please refer to AMION to reach the Neurologist on call

## 2018-06-05 NOTE — Progress Notes (Signed)
Called E-Link MD to verify rate on fluid bolus given patient's heart failure. New orders received, will continue to monitor.

## 2018-06-06 ENCOUNTER — Inpatient Hospital Stay (HOSPITAL_COMMUNITY): Payer: Medicare Other

## 2018-06-06 DIAGNOSIS — G9340 Encephalopathy, unspecified: Secondary | ICD-10-CM

## 2018-06-06 LAB — GLUCOSE, CAPILLARY
Glucose-Capillary: 109 mg/dL — ABNORMAL HIGH (ref 70–99)
Glucose-Capillary: 110 mg/dL — ABNORMAL HIGH (ref 70–99)
Glucose-Capillary: 112 mg/dL — ABNORMAL HIGH (ref 70–99)
Glucose-Capillary: 112 mg/dL — ABNORMAL HIGH (ref 70–99)
Glucose-Capillary: 112 mg/dL — ABNORMAL HIGH (ref 70–99)
Glucose-Capillary: 125 mg/dL — ABNORMAL HIGH (ref 70–99)

## 2018-06-06 LAB — CBC
HCT: 34 % — ABNORMAL LOW (ref 36.0–46.0)
Hemoglobin: 10.9 g/dL — ABNORMAL LOW (ref 12.0–15.0)
MCH: 29.4 pg (ref 26.0–34.0)
MCHC: 32.1 g/dL (ref 30.0–36.0)
MCV: 91.6 fL (ref 78.0–100.0)
Platelets: 223 10*3/uL (ref 150–400)
RBC: 3.71 MIL/uL — ABNORMAL LOW (ref 3.87–5.11)
RDW: 13.2 % (ref 11.5–15.5)
WBC: 11.1 10*3/uL — ABNORMAL HIGH (ref 4.0–10.5)

## 2018-06-06 LAB — BASIC METABOLIC PANEL
Anion gap: 6 (ref 5–15)
BUN: 21 mg/dL (ref 8–23)
CO2: 25 mmol/L (ref 22–32)
Calcium: 8.8 mg/dL — ABNORMAL LOW (ref 8.9–10.3)
Chloride: 109 mmol/L (ref 98–111)
Creatinine, Ser: 0.82 mg/dL (ref 0.44–1.00)
GFR calc Af Amer: >60 mL/min (ref >60)
GFR calc non Af Amer: >60 mL/min (ref >60)
Glucose, Bld: 104 mg/dL — ABNORMAL HIGH (ref 70–99)
Potassium: 3.7 mmol/L (ref 3.5–5.1)
Sodium: 140 mmol/L (ref 135–145)

## 2018-06-06 LAB — PHOSPHORUS: Phosphorus: 3 mg/dL (ref 2.5–4.6)

## 2018-06-06 LAB — PROCALCITONIN: Procalcitonin: <0.1 ng/mL

## 2018-06-06 LAB — MAGNESIUM: Magnesium: 1.8 mg/dL (ref 1.7–2.4)

## 2018-06-06 MED ORDER — MAGNESIUM SULFATE 2 GM/50ML IV SOLN
2.0000 g | Freq: Once | INTRAVENOUS | Status: AC
  Administered 2018-06-06: 2 g via INTRAVENOUS
  Filled 2018-06-06: qty 50

## 2018-06-06 NOTE — Progress Notes (Signed)
[name]  EXB:[id number] DOB: 22-Dec-1940 DOA: 06/04/2018 PCP: Laurann Montana, MD    LOS: 2 days   Reason for Consult / Chief Complaint:  Encephalopathy   Consulting MD and date of consult:  Dr. 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 ED note reports that son had started chest compressions, however son denies this and states that patient was unresponsive but when EMS arrived they checked for a pulse and patient had a pulse however on the way to the ED she was noted to have 2 seizures. CT head negative for acute. Due to inability to protect airway with continued encephalopathy patient was intubated. Neurology was consulted and PCCM asked to admit.   Son states that for the last few months patient's dementia has worsened. She still is able to ambulate however unable to perform any ADLs independently.   Subjective  No seizure activity since admit.  UOP remains low.  Increased alertness this am , followed commands.  No further hypoglycemic events. Tolerating TF well.  Failed SBT this am.   Assessment & Plan:   Acute Hypercarbic Respiratory Failure  Respiratory Insufficieny with acute encephalopathy  H/O COPD, Tobacco Dependence 9/1 -mentation is much improved, unclear why she fails SBT . Looks like she should be ready to extubate . Will adjust rate .  Plan  -Vent Support -Trend ABG/CXR   -Albuterol PRN -VAP Bundle -Pulmonary Hygiene  -Assess for daily SBT /wean  -Decrease Vent rate from 26 to 16 , see if this help stimulate her resp drive.   Chronic Diastolic Heart Failure  -Recent ECHO with EF 25-30  -troponin mild bump suspect demand  H/O HTN, PVD -Cardiac Monitoring    -Maintain Systolic <180 -PRN Hydralazine  -Continue home Coreg     Chronic Kidney Disease Stage 3  Plan  -Trend BMP  -Avoid Nephrotoxic Medications    Hyperglycemia -resolved  Hypoglycemia -resolved  Hypomagnesium  Plan  -Trend Glucose  -Replace mg   Acute Encephalopathy due to metabolic derangements vs post-ictal state Seizure  H/O CVA, Depression, Dementia 8/30 MRI -NAD, EEG no seizure act  Plan  -Neurology following  -RASS goal 0  -Fentanyl As needed  -limit sedation as able  - Keppra   -follow cx data   Fever ? Etiology  No sign of active infection  Plan  -follow cx data    Best Practice / Goals of Care / Disposition.   DVT prophylaxis: Heparin  GI prophylaxis: Protonix  Diet: NPO/TF  Mobility:Bedrest Code Status: Limited Code, Intubation/Bipap only  Family Communication: Son updated at bedside 8/31  Disposition / Summary of Today's Plan 06/06/18   77 year old female with progressive decline with dementia presents to ED unresponsive, s/p seizure. Intubated. Neurology consulted. MRI and EEG unrevealing . Increased alertness this am . Failed SBT , will adjust rate down to see if this stimulates her to breathe more on her own.   Consultants: date of consult/date signed off (if applicable)/final recs  8/30 > PCCM admit   Procedures: ETT 8/30 >>  Significant Diagnostic Tests: CXR 8/30 > 1. Endotracheal tube tip projects 4.9 cm above the carina. Enteric tube tip extends below the field of  view into the abdomen. 2. Stable cardiomegaly and aortic atherosclerosis. 3. Hyperinflated lungs.  No focal consolidation. CT Head 8/30 > No acute intracranial pathology. 2. Mild chronic microvascular ischemic changes. MRI 8/30 >nad  EEG 8/30 >>no seizure act   Micro Data: Blood 8/30 >> U/A 8/30 >>NEG  Sputum 8/30 >>  Antimicrobials:  N/A    Objective    Examination: General: Thin cachectic female on vent more alert  HENT: ETT in place  Lungs: Clear to  auscultation bilaterally on vent  Cardiovascular: RRR, no MRG regular rate and rhythm Abdomen: Soft positive bowel sounds  Extremities: No edema Neuro: Increase alertness follows simple commands Skin: Intact without rash   Blood pressure (!) 159/67, pulse 85, temperature (!) 100.6 F (38.1 C), temperature source Bladder, resp. rate 20, height 5\' 3"  (1.6 m), weight 42.6 kg, SpO2 97 %.    Vent Mode: PRVC FiO2 (%):  [40 %] 40 % Set Rate:  [26 bmp] 26 bmp Vt Set:  [420 mL] 420 mL PEEP:  [5 cmH20] 5 cmH20 Plateau Pressure:  [15 cmH20-21 cmH20] 15 cmH20   Intake/Output Summary (Last 24 hours) at 06/06/2018 0850 Last data filed at 06/06/2018 0800 Gross per 24 hour  Intake 1219.5 ml  Output 355 ml  Net 864.5 ml   Filed Weights   06/04/18 0516 06/05/18 0415 06/06/18 0500  Weight: 49 kg 41.3 kg 42.6 kg     Labs    CBC: Recent Labs  Lab 06/04/18 0208 06/04/18 0222 06/05/18 0635 06/06/18 0533  WBC 9.0  --  9.2 11.1*  NEUTROABS 6.6  --   --   --   HGB 13.6 14.6 13.6 10.9*  HCT 43.9 43.0 43.2 34.0*  MCV 95.4  --  93.1 91.6  PLT 289  --  255 223   Basic Metabolic Panel: Recent Labs  Lab 06/04/18 0208 06/04/18 0222 06/04/18 1424 06/05/18 0635 06/05/18 1641 06/06/18 0533  NA 141 142  --  141  --  140  K 3.7 3.6  --  3.9  --  3.7  CL 108 108  --  109  --  109  CO2 19*  --   --  25  --  25  GLUCOSE 161* 157*  --  96  --  104*  BUN 18 21  --  17  --  21  CREATININE 0.91 0.70  --  0.88  --  0.82  CALCIUM 9.3  --   --  9.1  --  8.8*  MG  --   --  1.8 1.7 1.7 1.8  PHOS  --   --  2.4* 3.1 2.7 3.0   GFR: Estimated Creatinine Clearance: 38 mL/min (by C-G formula based on SCr of 0.82 mg/dL). Recent Labs  Lab 06/04/18 0208 06/04/18 0350 06/05/18 0635 06/06/18 0533  PROCALCITON  --  <0.10 <0.10 <0.10  WBC 9.0  --  9.2 11.1*  LATICACIDVEN  --  1.5  --   --    Liver Function Tests: Recent Labs  Lab 06/04/18 0208  AST 33  ALT 17  ALKPHOS 75  BILITOT 0.4  PROT 7.3    ALBUMIN 3.5   No results for input(s): LIPASE, AMYLASE in the last 168 hours. Recent Labs  Lab 06/04/18 0350  AMMONIA 42*   ABG    Component Value Date/Time   PHART 7.375 06/04/2018 0447   PCO2ART 37.3 06/04/2018 0447   PO2ART 156.0 (H) 06/04/2018 0447   HCO3 21.8 06/04/2018 0447  TCO2 23 06/04/2018 0447   ACIDBASEDEF 3.0 (H) 06/04/2018 0447   O2SAT 99.0 06/04/2018 0447    Coagulation Profile: Recent Labs  Lab 06/04/18 0208  INR 1.17   Cardiac Enzymes: Recent Labs  Lab 06/04/18 0751 06/04/18 1424 06/04/18 1935  TROPONINI 0.03* 0.03* 0.04*   HbA1C: Hgb A1c MFr Bld  Date/Time Value Ref Range Status  03/01/2015 02:18 AM 5.9 (H) 4.8 - 5.6 % Final    Comment:    (NOTE)         Pre-diabetes: 5.7 - 6.4         Diabetes: >6.4         Glycemic control for adults with diabetes: <7.0    CBG: Recent Labs  Lab 06/05/18 1451 06/05/18 1943 06/05/18 2306 06/06/18 0319 06/06/18 0812  GLUCAP 102* 114* 117* 109* 112*   Mashayla Lavin NP-C  [facility]  [phone]   06/06/2018

## 2018-06-06 NOTE — Progress Notes (Signed)
Pt placed back on full vent support due to low RR and MVe.  RN notified. 

## 2018-06-07 LAB — GLUCOSE, CAPILLARY
Glucose-Capillary: 100 mg/dL — ABNORMAL HIGH (ref 70–99)
Glucose-Capillary: 128 mg/dL — ABNORMAL HIGH (ref 70–99)
Glucose-Capillary: 129 mg/dL — ABNORMAL HIGH (ref 70–99)
Glucose-Capillary: 71 mg/dL (ref 70–99)
Glucose-Capillary: 85 mg/dL (ref 70–99)
Glucose-Capillary: 88 mg/dL (ref 70–99)

## 2018-06-07 LAB — BASIC METABOLIC PANEL
Anion gap: 5 (ref 5–15)
BUN: 18 mg/dL (ref 8–23)
CO2: 27 mmol/L (ref 22–32)
Calcium: 9 mg/dL (ref 8.9–10.3)
Chloride: 112 mmol/L — ABNORMAL HIGH (ref 98–111)
Creatinine, Ser: 0.68 mg/dL (ref 0.44–1.00)
GFR calc Af Amer: >60 mL/min (ref >60)
GFR calc non Af Amer: >60 mL/min (ref >60)
Glucose, Bld: 128 mg/dL — ABNORMAL HIGH (ref 70–99)
Potassium: 3.7 mmol/L (ref 3.5–5.1)
Sodium: 144 mmol/L (ref 135–145)

## 2018-06-07 LAB — CBC
HCT: 34.7 % — ABNORMAL LOW (ref 36.0–46.0)
Hemoglobin: 10.9 g/dL — ABNORMAL LOW (ref 12.0–15.0)
MCH: 29.3 pg (ref 26.0–34.0)
MCHC: 31.4 g/dL (ref 30.0–36.0)
MCV: 93.3 fL (ref 78.0–100.0)
Platelets: 220 10*3/uL (ref 150–400)
RBC: 3.72 MIL/uL — ABNORMAL LOW (ref 3.87–5.11)
RDW: 13.2 % (ref 11.5–15.5)
WBC: 13.2 10*3/uL — ABNORMAL HIGH (ref 4.0–10.5)

## 2018-06-07 LAB — MAGNESIUM: Magnesium: 2.2 mg/dL (ref 1.7–2.4)

## 2018-06-07 MED ORDER — FUROSEMIDE 20 MG PO TABS
20.0000 mg | ORAL_TABLET | Freq: Every day | ORAL | Status: DC
  Administered 2018-06-07: 20 mg
  Filled 2018-06-07: qty 1

## 2018-06-07 MED ORDER — FENTANYL CITRATE (PF) 100 MCG/2ML IJ SOLN: 12.5000 ug | INTRAMUSCULAR | Status: DC | PRN

## 2018-06-07 MED ORDER — ORAL CARE MOUTH RINSE
15.0000 mL | Freq: Two times a day (BID) | OROMUCOSAL | Status: DC
  Administered 2018-06-09 – 2018-06-16 (×9): 15 mL via OROMUCOSAL

## 2018-06-07 MED ORDER — SERTRALINE HCL 50 MG PO TABS
50.0000 mg | ORAL_TABLET | Freq: Every day | ORAL | Status: DC
  Administered 2018-06-07: 50 mg
  Filled 2018-06-07: qty 1

## 2018-06-07 MED ORDER — CHLORHEXIDINE GLUCONATE 0.12 % MT SOLN
15.0000 mL | Freq: Two times a day (BID) | OROMUCOSAL | Status: DC
  Administered 2018-06-07: 15 mL via OROMUCOSAL
  Filled 2018-06-07: qty 15

## 2018-06-07 MED ORDER — IPRATROPIUM-ALBUTEROL 0.5-2.5 (3) MG/3ML IN SOLN
3.0000 mL | Freq: Four times a day (QID) | RESPIRATORY_TRACT | Status: DC
  Administered 2018-06-07 – 2018-06-08 (×5): 3 mL via RESPIRATORY_TRACT
  Filled 2018-06-07 (×6): qty 3

## 2018-06-07 NOTE — Progress Notes (Signed)
[name]  EAV:[id number] DOB: [birth year] DOA: 06/04/2018 PCP: Laurann Montana, MD    LOS: 3 days   Reason for Consult / Chief Complaint:  Encephalopathy   Consulting MD and date of consult:  Dr. 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 ED note reports that son had started chest compressions, however son denies this and states that patient was unresponsive but when EMS arrived they checked for a pulse and patient had a pulse however on the way to the ED she was noted to have 2 seizures. CT head negative for acute. Due to inability to protect airway with continued encephalopathy patient was intubated. Neurology was consulted and PCCM asked to admit.   Son states that for the last few months patient's dementia has worsened. She still is able to ambulate however unable to perform any ADLs independently.   Subjective  No seizure act since admit UOP remains low.  Alert, intermittent agitation, follows commands  Difficult weaning with low volumes    Assessment & Plan:   Acute Hypercarbic Respiratory Failure  Respiratory Insufficieny with acute encephalopathy  H/O COPD, Tobacco Dependence (heavy smoker )  9/2 -mentation is improved, intermittent agitation, suspect fentanyl is contributing to hypoventilation with weaning .  Plan  -Vent Support -Trend ABG/CXR   -add Duoneb q6  -VAP Bundle -Pulmonary Hygiene  -Assess for daily SBT /wean  D/c fent , see if able to wean better.    Chronic Diastolic Heart Failure  -Recent ECHO with EF 25-30  -troponin mild bump suspect demand  H/O HTN, PVD -Cardiac Monitoring  -Maintain Systolic <180 -PRN Hydralazine  -Continue home Coreg     -restart home lasix   Chronic Kidney Disease Stage 3  9/2 UOP remains low, scr ok  Plan  -Trend BMP  -Avoid Nephrotoxic Medications  -restart home lasix   Hyperglycemia -resolved  Hypoglycemia -resolved  Hypomagnesium -resolved  Plan  -Trend Glucose  -tr electrolytes   Acute Encephalopathy due to metabolic derangements vs post-ictal state Seizure  H/O CVA, Depression, Dementia 8/30 MRI -NAD, EEG no seizure act  Plan  -Neurology following  -RASS goal 0  -Change Fentanyl As needed  -limit sedation as able  - Keppra   -follow cx data  -restart home  zoloft   Fever ? Etiology  No sign of active infection  Plan  -follow cx data  -tr wbc/temp    Best Practice / Goals of Care / Disposition.   DVT prophylaxis: Heparin  GI prophylaxis: Protonix  Diet: NPO/TF  Mobility:Bedrest Code Status: Limited Code, Intubation/Bipap only  Family Communication: Son updated at bedside 8/31, 9/2   Disposition / Summary of Today's Plan 06/07/18   77 year old female with progressive decline with dementia presents to ED unresponsive, s/p seizure. Intubated. Neurology consulted. MRI and EEG unrevealing . Increased alertness this am . Has low volumes on weaning , d/c fentanyl . Limit sedation as able .   Consultants: date of consult/date signed off (if applicable)/final recs  8/30 > PCCM admit   Procedures: ETT 8/30 >>  Significant Diagnostic Tests: CXR 8/30 > 1. Endotracheal tube tip projects 4.9 cm above the carina. Enteric tube tip extends below the  field of view into the abdomen. 2. Stable cardiomegaly and aortic atherosclerosis. 3. Hyperinflated lungs.  No focal consolidation. CT Head 8/30 > No acute intracranial pathology. 2. Mild chronic microvascular ischemic changes. MRI 8/30 >nad  EEG 8/30 >>no seizure act   Micro Data: Blood 8/30 >> U/A 8/30 >>NEG  Sputum 8/30 >>  Antimicrobials:  N/A    Objective    Examination: General: thin cachectic female on vent  HENT:  ETT  Lungs: CTA on vent , decreased in bases   Cardiovascular: RRR , no mrg,  Abdomen: soft , +BS   Extremities: no edema  Neuro: alert, f/c intermittently , some agitation  Skin: intact, no rash    Blood pressure (!) 135/58, pulse 77, temperature 99.7 F (37.6 C), resp. rate 15, height 5\' 3"  (1.6 m), weight 41.8 kg, SpO2 100 %.    Vent Mode: PSV;CPAP FiO2 (%):  [40 %] 40 % Set Rate:  [16 bmp] 16 bmp Vt Set:  [420 mL] 420 mL PEEP:  [5 cmH20] 5 cmH20 Pressure Support:  [5 cmH20-10 cmH20] 10 cmH20 Plateau Pressure:  [8 cmH20-17 cmH20] 8 cmH20   Intake/Output Summary (Last 24 hours) at 06/07/2018 0951 Last data filed at 06/07/2018 0900 Gross per 24 hour  Intake 1410.04 ml  Output 165 ml  Net 1245.04 ml   Filed Weights   06/05/18 0415 06/06/18 0500 06/07/18 0500  Weight: 41.3 kg 42.6 kg 41.8 kg     Labs    CBC: Recent Labs  Lab 06/04/18 0208 06/04/18 0222 06/05/18 0635 06/06/18 0533 06/07/18 0244  WBC 9.0  --  9.2 11.1* 13.2*  NEUTROABS 6.6  --   --   --   --   HGB 13.6 14.6 13.6 10.9* 10.9*  HCT 43.9 43.0 43.2 34.0* 34.7*  MCV 95.4  --  93.1 91.6 93.3  PLT 289  --  255 223 220   Basic Metabolic Panel: Recent Labs  Lab 06/04/18 0208 06/04/18 0222 06/04/18 1424 06/05/18 0635 06/05/18 1641 06/06/18 0533 06/07/18 0244  NA 141 142  --  141  --  140 144  K 3.7 3.6  --  3.9  --  3.7 3.7  CL 108 108  --  109  --  109 112*  CO2 19*  --   --  25  --  25 27  GLUCOSE 161* 157*  --  96  --  104* 128*  BUN 18 21  --  17  --  21 18  CREATININE 0.91 0.70  --  0.88  --  0.82 0.68  CALCIUM 9.3  --   --  9.1  --  8.8* 9.0  MG  --   --  1.8 1.7 1.7 1.8 2.2  PHOS  --   --  2.4* 3.1 2.7 3.0  --    GFR: Estimated Creatinine Clearance: 38.2 mL/min (by C-G formula based on SCr of 0.68 mg/dL). Recent Labs  Lab 06/04/18 0208 06/04/18 0350 06/05/18 0635 06/06/18 0533 06/07/18 0244  PROCALCITON  --  <0.10 <0.10 <0.10  --   WBC 9.0  --  9.2 11.1* 13.2*  LATICACIDVEN  --   1.5  --   --   --    Liver Function Tests: Recent Labs  Lab 06/04/18 0208  AST 33  ALT 17  ALKPHOS 75  BILITOT 0.4  PROT 7.3  ALBUMIN 3.5   No results for input(s): LIPASE, AMYLASE in the last 168 hours. Recent Labs  Lab 06/04/18 0350  AMMONIA 42*   ABG    Component Value Date/Time   PHART 7.375 06/04/2018 0447   PCO2ART 37.3 06/04/2018 0447   PO2ART 156.0 (H) 06/04/2018 0447   HCO3 21.8 06/04/2018 0447   TCO2 23 06/04/2018 0447   ACIDBASEDEF 3.0 (H) 06/04/2018 0447   O2SAT 99.0 06/04/2018 0447    Coagulation Profile: Recent Labs  Lab 06/04/18 0208  INR 1.17   Cardiac Enzymes: Recent Labs  Lab 06/04/18 0751 06/04/18 1424 06/04/18 1935  TROPONINI 0.03* 0.03* 0.04*   HbA1C: Hgb A1c MFr Bld  Date/Time Value Ref Range Status  03/01/2015 02:18 AM 5.9 (H) 4.8 - 5.6 % Final    Comment:    (NOTE)         Pre-diabetes: 5.7 - 6.4         Diabetes: >6.4         Glycemic control for adults with diabetes: <7.0    CBG: Recent Labs  Lab 06/06/18 1556 06/06/18 2054 06/06/18 2331 06/07/18 0331 06/07/18 0804  GLUCAP 112* 110* 125* 129* 100*   Tammy Parrett NP-C  [facility]  [phone]   06/07/2018

## 2018-06-07 NOTE — Progress Notes (Signed)
[name]  TTS:[id number] DOB: Jan 07, 1941 DOA: 06/04/2018 PCP: Laurann Montana, MD    LOS: 3 days   Reason for Consult / Chief Complaint:  Encephalopathy   Consulting MD and date of consult:  Dr. 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 ED note reports that son had started chest compressions, however son denies this and states that patient was unresponsive but when EMS arrived they checked for a pulse and patient had a pulse however on the way to the ED she was noted to have 2 seizures. CT head negative for acute. Due to inability to protect airway with continued encephalopathy patient was intubated. Neurology was consulted and PCCM asked to admit.   Son states that for the last few months patient's dementia has worsened. She still is able to ambulate however unable to perform any ADLs independently.   Subjective  No seizure activity since admit.  UOP remains low.  Increased alertness this am , followed commands.  No further hypoglycemic events. Tolerating TF well.  Failed SBT this am.   Assessment & Plan:   Acute Hypercarbic Respiratory Failure  Respiratory Insufficieny with acute encephalopathy  H/O COPD, Tobacco Dependence 9/1 -mentation is much improved, unclear why she fails SBT . Looks like she should be ready to extubate . Will adjust rate .  Plan  -Vent Support -Trend ABG/CXR   -Albuterol PRN -VAP Bundle -Pulmonary Hygiene  -Assess for daily SBT /wean  -Decrease Vent rate from 26 to 16 , see if this help stimulate her resp drive.   Chronic Diastolic Heart Failure  -Recent ECHO with EF 25-30  -troponin mild bump suspect demand  H/O HTN, PVD -Cardiac Monitoring    -Maintain Systolic <180 -PRN Hydralazine  -Continue home Coreg     Chronic Kidney Disease Stage 3  Plan  -Trend BMP  -Avoid Nephrotoxic Medications    Hyperglycemia -resolved  Hypoglycemia -resolved  Hypomagnesium  Plan  -Trend Glucose  -Replace mg   Acute Encephalopathy due to metabolic derangements vs post-ictal state Seizure  H/O CVA, Depression, Dementia 8/30 MRI -NAD, EEG no seizure act  Plan  -Neurology following  -RASS goal 0  -Fentanyl As needed  -limit sedation as able  - Keppra   -follow cx data   Fever ? Etiology  No sign of active infection  Plan  -follow cx data    Best Practice / Goals of Care / Disposition.   DVT prophylaxis: Heparin  GI prophylaxis: Protonix  Diet: NPO/TF  Mobility:Bedrest Code Status: Limited Code, Intubation/Bipap only  Family Communication: Son updated at bedside 8/31  Disposition / Summary of Today's Plan 06/07/18   77 year old female with progressive decline with dementia presents to ED unresponsive, s/p seizure. Intubated. Neurology consulted. MRI and EEG unrevealing . Increased alertness this am . Failed SBT , will adjust rate down to see if this stimulates her to breathe more on her own.   Consultants: date of consult/date signed off (if applicable)/final recs  8/30 > PCCM admit   Procedures: ETT 8/30 >>  Significant Diagnostic Tests: CXR 8/30 > 1. Endotracheal tube tip projects 4.9 cm above the carina. Enteric tube tip extends below the field of  view into the abdomen. 2. Stable cardiomegaly and aortic atherosclerosis. 3. Hyperinflated lungs.  No focal consolidation. CT Head 8/30 > No acute intracranial pathology. 2. Mild chronic microvascular ischemic changes. MRI 8/30 >nad  EEG 8/30 >>no seizure act   Micro Data: Blood 8/30 >> U/A 8/30 >>NEG  Sputum 8/30 >>  Antimicrobials:  N/A    Objective    Examination: General: Thin cachectic female on vent more alert  HENT: ETT in place  Lungs: Clear to  auscultation bilaterally on vent  Cardiovascular: RRR, no MRG regular rate and rhythm Abdomen: Soft positive bowel sounds  Extremities: No edema Neuro: Increase alertness follows simple commands Skin: Intact without rash   Blood pressure (!) 143/50, pulse 72, temperature 99.3 F (37.4 C), temperature source Bladder, resp. rate (!) 23, height 5\' 3"  (1.6 m), weight 41.8 kg, SpO2 100 %.    Vent Mode: PSV;CPAP FiO2 (%):  [40 %] 40 % Set Rate:  [16 bmp] 16 bmp Vt Set:  [420 mL] 420 mL PEEP:  [5 cmH20] 5 cmH20 Pressure Support:  [5 cmH20-10 cmH20] 10 cmH20 Plateau Pressure:  [8 cmH20-17 cmH20] 8 cmH20   Intake/Output Summary (Last 24 hours) at 06/07/2018 0938 Last data filed at 06/07/2018 0800 Gross per 24 hour  Intake 1399.99 ml  Output 165 ml  Net 1234.99 ml   Filed Weights   06/05/18 0415 06/06/18 0500 06/07/18 0500  Weight: 41.3 kg 42.6 kg 41.8 kg     Labs    CBC: Recent Labs  Lab 06/04/18 0208 06/04/18 0222 06/05/18 0635 06/06/18 0533 06/07/18 0244  WBC 9.0  --  9.2 11.1* 13.2*  NEUTROABS 6.6  --   --   --   --   HGB 13.6 14.6 13.6 10.9* 10.9*  HCT 43.9 43.0 43.2 34.0* 34.7*  MCV 95.4  --  93.1 91.6 93.3  PLT 289  --  255 223 220   Basic Metabolic Panel: Recent Labs  Lab 06/04/18 0208 06/04/18 0222 06/04/18 1424 06/05/18 0635 06/05/18 1641 06/06/18 0533 06/07/18 0244  NA 141 142  --  141  --  140 144  K 3.7 3.6  --  3.9  --  3.7 3.7  CL 108 108  --  109  --  109 112*  CO2 19*  --   --  25  --  25 27  GLUCOSE 161* 157*  --  96  --  104* 128*  BUN 18 21  --  17  --  21 18  CREATININE 0.91 0.70  --  0.88  --  0.82 0.68  CALCIUM 9.3  --   --  9.1  --  8.8* 9.0  MG  --   --  1.8 1.7 1.7 1.8 2.2  PHOS  --   --  2.4* 3.1 2.7 3.0  --    GFR: Estimated Creatinine Clearance: 38.2 mL/min (by C-G formula based on SCr of 0.68 mg/dL). Recent Labs  Lab 06/04/18 0208 06/04/18 0350 06/05/18 0635 06/06/18 0533 06/07/18 0244  PROCALCITON  --  <0.10 <0.10 <0.10   --   WBC 9.0  --  9.2 11.1* 13.2*  LATICACIDVEN  --  1.5  --   --   --    Liver Function Tests: Recent Labs  Lab 06/04/18 0208  AST 33  ALT 17  ALKPHOS 75  BILITOT 0.4  PROT 7.3  ALBUMIN 3.5   No results for input(s): LIPASE, AMYLASE in the last 168 hours. Recent Labs  Lab 06/04/18  0350  AMMONIA 42*   ABG    Component Value Date/Time   PHART 7.375 06/04/2018 0447   PCO2ART 37.3 06/04/2018 0447   PO2ART 156.0 (H) 06/04/2018 0447   HCO3 21.8 06/04/2018 0447   TCO2 23 06/04/2018 0447   ACIDBASEDEF 3.0 (H) 06/04/2018 0447   O2SAT 99.0 06/04/2018 0447    Coagulation Profile: Recent Labs  Lab 06/04/18 0208  INR 1.17   Cardiac Enzymes: Recent Labs  Lab 06/04/18 0751 06/04/18 1424 06/04/18 1935  TROPONINI 0.03* 0.03* 0.04*   HbA1C: Hgb A1c MFr Bld  Date/Time Value Ref Range Status  03/01/2015 02:18 AM 5.9 (H) 4.8 - 5.6 % Final    Comment:    (NOTE)         Pre-diabetes: 5.7 - 6.4         Diabetes: >6.4         Glycemic control for adults with diabetes: <7.0    CBG: Recent Labs  Lab 06/06/18 1556 06/06/18 2054 06/06/18 2331 06/07/18 0331 06/07/18 0804  GLUCAP 112* 110* 125* 129* 100*   Tammy Parrett NP-C   Pulmonary and Critical Care  [phone]   06/07/2018

## 2018-06-07 NOTE — Procedures (Signed)
Extubation Procedure Note  Patient Details:   Name: Tammy [name] DOB: 01/02/1941 MRN: [id number]   Airway Documentation:    Vent end date: 06/07/18 Vent end time: 1334   Evaluation  O2 sats: stable throughout Complications: No apparent complications Patient did tolerate procedure well. Bilateral Breath Sounds: Rhonchi   Yes, Placed on 4L [place] SPO2 100%  Tammy [name] 06/07/2018, 1:36 PM

## 2018-06-07 NOTE — Evaluation (Signed)
Physical Therapy Evaluation Patient Details Name: Tammy [name] MRN: [id number] DOB: 10-23-1940 Today's Date: 06/07/2018   History of Present Illness  77 year old female with PMH of Systolic Heart Failure (EF 25-30, G2DD), HTN, Tobacco Use, PVD, Stage 3 CKD, Dementia, Depression, COPD, CVA  admitted to [facility] for seizures.  Clinical Impression  Orders received for PT evaluation. Patient demonstrates deficits in functional mobility as indicated below. Will benefit from continued skilled PT to address deficits and maximize function. Will see as indicated and progress as tolerated.  Patient known to this service from June admission. At that time, family declined SNF and reported family assist provided at home. No family at bedside this session to confirm if this remains the case. At this time, recommending SNF or home with increased physical assist from family.    Follow Up Recommendations Supervision/Assistance - 24 hour(has refused SNF in the past)    Equipment Recommendations  (TBD)    Recommendations for Other Services       Precautions / Restrictions Precautions Precautions: Fall Restrictions Weight Bearing Restrictions: No      Mobility  Bed Mobility Overal bed mobility: Needs Assistance Bed Mobility: Rolling;Supine to Sit Rolling: Mod assist   Supine to sit: Mod assist     General bed mobility comments: Moderate physical assist to initate movement to EOb and elevate trunk to upright at EOB. increased time and effort noted  Transfers Overall transfer level: Needs assistance Equipment used: (face to face ) Transfers: Sit to/from [employer] Sit to Stand: Mod assist Stand pivot transfers: Max assist       General transfer comment: Mod assist to power up to standing, significant posterior bias and poor ability to come to upright with weight over toes, max assist to pivot to chair  Ambulation/Gait             General Gait Details: unable to  attempt at this time  Stairs            Wheelchair Mobility    Modified Rankin (Stroke Patients Only) Modified Rankin (Stroke Patients Only) Pre-Morbid Rankin Score: Moderately severe disability Modified Rankin: Severe disability     Balance Overall balance assessment: Needs assistance Sitting-balance support: Feet supported Sitting balance-Leahy Scale: Poor Sitting balance - Comments: min assist to maintain EOB     Standing balance-Leahy Scale: Poor Standing balance comment: moderate to max assist to maintain upright, posterior bias prominent                             Pertinent Vitals/Pain      Home Living Family/patient expects to be discharged to:: Private residence Living Arrangements: Children Available Help at Discharge: Family;Personal care attendant(Unsure of availability) Type of Home: House Home Access: Level entry     Home Layout: One level Home Equipment: Walker - 2 wheels Additional Comments: Pt unable to provide information. Information gathered from prior admissions and current notes    Prior Function Level of Independence: Needs assistance         Comments: patient not able to provide much history     Hand Dominance   Dominant Hand: Right    Extremity/Trunk Assessment   Upper Extremity Assessment Upper Extremity Assessment: Generalized weakness;Difficult to assess due to impaired cognition    Lower Extremity Assessment Lower Extremity Assessment: Generalized weakness;Difficult to assess due to impaired cognition    Cervical / Trunk Assessment Cervical / Trunk Assessment: Kyphotic  Communication  Cognition Arousal/Alertness: Awake/alert Behavior During Therapy: WFL for tasks assessed/performed Overall Cognitive Status: History of cognitive impairments - at baseline                                 General Comments: patient limited verbally but is following some commands during session       General Comments      Exercises     Assessment/Plan    PT Assessment Patient needs continued PT services  PT Problem List Decreased strength;Decreased range of motion;Decreased activity tolerance;Decreased mobility;Decreased balance;Decreased coordination;Decreased cognition;Decreased safety awareness       PT Treatment Interventions DME instruction;Gait training;Functional mobility training;Therapeutic activities;Therapeutic exercise;Balance training;Neuromuscular re-education;Cognitive remediation;Patient/family education    PT Goals (Current goals can be found in the Care Plan section)  Acute Rehab PT Goals Patient Stated Goal: none stated PT Goal Formulation: Patient unable to participate in goal setting Time For Goal Achievement: 06/21/18 Potential to Achieve Goals: Fair    Frequency Min 3X/week   Barriers to discharge        Co-evaluation               AM-PAC PT "6 Clicks" Daily Activity  Outcome Measure Difficulty turning over in bed (including adjusting bedclothes, sheets and blankets)?: Unable Difficulty moving from lying on back to sitting on the side of the bed? : Unable Difficulty sitting down on and standing up from a chair with arms (e.g., wheelchair, bedside commode, etc,.)?: Unable Help needed moving to and from a bed to chair (including a wheelchair)?: Total Help needed walking in hospital room?: Total Help needed climbing 3-5 steps with a railing? : Total 6 Click Score: 6    End of Session Equipment Utilized During Treatment: Gait belt;Oxygen Activity Tolerance: Patient tolerated treatment well;Patient limited by fatigue Patient left: in chair;with call bell/phone within reach;with nursing/sitter in room Nurse Communication: Mobility status PT Visit Diagnosis: Unsteadiness on feet (R26.81);Muscle weakness (generalized) (M62.81);Difficulty in walking, not elsewhere classified (R26.2);Other symptoms and signs involving the nervous system (R29.898)     Time: 0454-0981 PT Time Calculation (min) (ACUTE ONLY): 22 min   Charges:   PT Evaluation $PT Eval Moderate Complexity: 1 Mod          Charlotte Crumb, PT DPT  Board Certified Neurologic Specialist [phone]   Fabio Asa 06/07/2018, 4:52 PM

## 2018-06-07 NOTE — Progress Notes (Signed)
RT attempted to instruct pt on the use of incentive spirometer.  Pt not sucking in on the mouthpiece.  When family comes, we will attempt again.

## 2018-06-08 LAB — BASIC METABOLIC PANEL
Anion gap: 11 (ref 5–15)
BUN: 18 mg/dL (ref 8–23)
CO2: 25 mmol/L (ref 22–32)
Calcium: 9.4 mg/dL (ref 8.9–10.3)
Chloride: 108 mmol/L (ref 98–111)
Creatinine, Ser: 0.75 mg/dL (ref 0.44–1.00)
GFR calc Af Amer: >60 mL/min (ref >60)
GFR calc non Af Amer: >60 mL/min (ref >60)
Glucose, Bld: 83 mg/dL (ref 70–99)
Potassium: 3.9 mmol/L (ref 3.5–5.1)
Sodium: 144 mmol/L (ref 135–145)

## 2018-06-08 LAB — CBC
HCT: 37.7 % (ref 36.0–46.0)
Hemoglobin: 11.9 g/dL — ABNORMAL LOW (ref 12.0–15.0)
MCH: 29.4 pg (ref 26.0–34.0)
MCHC: 31.6 g/dL (ref 30.0–36.0)
MCV: 93.1 fL (ref 78.0–100.0)
Platelets: 265 10*3/uL (ref 150–400)
RBC: 4.05 MIL/uL (ref 3.87–5.11)
RDW: 13.1 % (ref 11.5–15.5)
WBC: 13 10*3/uL — ABNORMAL HIGH (ref 4.0–10.5)

## 2018-06-08 LAB — GLUCOSE, CAPILLARY
Glucose-Capillary: 72 mg/dL (ref 70–99)
Glucose-Capillary: 46 mg/dL — ABNORMAL LOW (ref 70–99)
Glucose-Capillary: 104 mg/dL — ABNORMAL HIGH (ref 70–99)
Glucose-Capillary: 83 mg/dL (ref 70–99)
Glucose-Capillary: 130 mg/dL — ABNORMAL HIGH (ref 70–99)

## 2018-06-08 MED ORDER — ENSURE ENLIVE PO LIQD
237.0000 mL | Freq: Two times a day (BID) | ORAL | Status: DC
  Administered 2018-06-09 – 2018-06-16 (×16): 237 mL via ORAL

## 2018-06-08 MED ORDER — DEXTROSE 50 % IV SOLN
25.0000 mL | Freq: Once | INTRAVENOUS | Status: AC
  Administered 2018-06-08: 25 mL via INTRAVENOUS

## 2018-06-08 MED ORDER — ONDANSETRON HCL 4 MG/2ML IJ SOLN: 4.0000 mg | Freq: Four times a day (QID) | INTRAMUSCULAR | Status: DC | PRN

## 2018-06-08 MED ORDER — SERTRALINE HCL 50 MG PO TABS
50.0000 mg | ORAL_TABLET | Freq: Every day | ORAL | Status: DC
  Administered 2018-06-08 – 2018-06-16 (×9): 50 mg via ORAL
  Filled 2018-06-08 (×9): qty 1

## 2018-06-08 MED ORDER — LISINOPRIL 2.5 MG PO TABS
2.5000 mg | ORAL_TABLET | Freq: Every day | ORAL | Status: DC
  Administered 2018-06-08 – 2018-06-16 (×9): 2.5 mg via ORAL
  Filled 2018-06-08 (×9): qty 1

## 2018-06-08 MED ORDER — BISACODYL 10 MG RE SUPP
10.0000 mg | Freq: Every day | RECTAL | Status: DC | PRN
  Filled 2018-06-08: qty 1

## 2018-06-08 MED ORDER — FUROSEMIDE 20 MG PO TABS
20.0000 mg | ORAL_TABLET | Freq: Every day | ORAL | Status: DC
  Administered 2018-06-08 – 2018-06-16 (×9): 20 mg via ORAL
  Filled 2018-06-08 (×9): qty 1

## 2018-06-08 MED ORDER — IPRATROPIUM-ALBUTEROL 0.5-2.5 (3) MG/3ML IN SOLN
3.0000 mL | Freq: Three times a day (TID) | RESPIRATORY_TRACT | Status: DC
  Administered 2018-06-09: 3 mL via RESPIRATORY_TRACT
  Filled 2018-06-08: qty 3

## 2018-06-08 MED ORDER — DEXTROSE 50 % IV SOLN
INTRAVENOUS | Status: AC
  Filled 2018-06-08: qty 50

## 2018-06-08 MED ORDER — ONDANSETRON HCL 4 MG/2ML IJ SOLN
4.0000 mg | Freq: Four times a day (QID) | INTRAMUSCULAR | Status: DC
  Filled 2018-06-08: qty 2

## 2018-06-08 MED ORDER — ACETAMINOPHEN 325 MG PO TABS
325.0000 mg | ORAL_TABLET | Freq: Four times a day (QID) | ORAL | Status: DC | PRN
  Administered 2018-06-08 – 2018-06-14 (×6): 325 mg via ORAL
  Filled 2018-06-08 (×6): qty 1

## 2018-06-08 MED ORDER — LEVETIRACETAM 250 MG PO TABS
250.0000 mg | ORAL_TABLET | Freq: Two times a day (BID) | ORAL | Status: DC
  Administered 2018-06-08 – 2018-06-16 (×17): 250 mg via ORAL
  Filled 2018-06-08 (×18): qty 1

## 2018-06-08 NOTE — Progress Notes (Signed)
Nutrition Follow-up  DOCUMENTATION CODES:   Severe malnutrition in context of chronic illness  INTERVENTION:   Ensure Enlive po BID, each supplement provides 350 kcal and 20 grams of protein  NUTRITION DIAGNOSIS:   Severe Malnutrition related to chronic illness(COPD, dementia, CKD stage III, CHF) as evidenced by severe fat depletion, moderate muscle depletion, severe muscle depletion. Ongoing.   GOAL:   Patient will meet greater than or equal to 90% of their needs Progressing.   MONITOR:   PO intake, Supplement acceptance  ASSESSMENT:   77 year old female who presented to the ED for seizures and AMS. PMH significant for dementia, COPD, CKD stage III, hypertension, prior CVA, and CHF. Pt not a candidate for TPA. Pt intubated in the ED.  Pt discussed during ICU rounds and with RN.  Spoke with pt and Charity fundraiser. Pt unable to answer any questions. Per RN pt ate applesauce and pudding today with diet advancement to Dysphgia 2 with thin liquids.  Per RN son does report a lot of weight loss. No family present during visit.   Meal Completion: 10% at lunch   9/2 extubated  Medications reviewed and include: lasix 20 mg dialy,  Labs reviewed   Diet Order:   Diet Order            DIET DYS 2 Room service appropriate? Yes; Fluid consistency: Thin  Diet effective now              EDUCATION NEEDS:   No education needs have been identified at this time  Skin:  Skin Assessment: Reviewed RN Assessment(ecchymosis to bilateral knees, abrasion to R knee)  Last BM:  PTA  Height:   Ht Readings from Last 1 Encounters:  06/04/18 5\' 3"  (1.6 m)    Weight:   Wt Readings from Last 1 Encounters:  06/08/18 41.9 kg    Ideal Body Weight:  52.27 kg  BMI:  Body mass index is 16.36 kg/m.  Estimated Nutritional Needs:   Kcal:  3202-3343  Protein:  65-80 grams  Fluid:  >1.5 L/day  Kendell Bane RD, LDN, CNSC [phone] Pager [phone] After Hours Pager

## 2018-06-08 NOTE — Progress Notes (Signed)
[name]  MVH:[id number] DOB: [birth year] DOA: 06/04/2018 PCP: Laurann Montana, MD    LOS: 4 days   Reason for Consult / Chief Complaint:  Encephalopathy   Consulting MD and date of consult:  Dr. 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 ED note reports that son had started chest compressions, however son denies this and states that patient was unresponsive but when EMS arrived they checked for a pulse and patient had a pulse however on the way to the ED she was noted to have 2 seizures. CT head negative for acute. Due to inability to protect airway with continued encephalopathy patient was intubated. Neurology was consulted and PCCM asked to admit.   Son states that for the last few months patient's dementia has worsened. She still is able to ambulate however unable to perform any ADLs independently.   9/2 extubated and tolerated well 9/3 transferred out of ICU  Subjective  Extubated 9/2 and tolerated well.  Awake this AM, nods and answers some questions though has some confusion.  Vitals stable.  Objective    Examination: General: thin cachectic female , in NAD HENT: Temporal wasting.  MM dry. Lungs: Normal effort.  CTAB. Cardiovascular: RRR , no M/R/G. Abdomen: BS x 4, S/NT/ND. Extremities: no edema.  Neuro: Awake, follows commands intermitently, no deficits. Skin: intact, no rash.   Blood pressure (!) 173/67, pulse 93, temperature 98.3 F (36.8 C), temperature source Axillary, resp. rate (!) 27, height 5\' 3"  (1.6 m), weight 41.9 kg, SpO2 96 %.    Vent Mode: PSV;CPAP FiO2 (%):  [40 %] 40 % PEEP:  [5 cmH20] 5 cmH20 Pressure Support:  [10 cmH20] 10 cmH20   Intake/Output  Summary (Last 24 hours) at 06/08/2018 0944 Last data filed at 06/08/2018 0700 Gross per 24 hour  Intake 489.78 ml  Output 505 ml  Net -15.22 ml   Filed Weights   06/06/18 0500 06/07/18 0500 06/08/18 0457  Weight: 42.6 kg 41.8 kg 41.9 kg     Labs    CBC: Recent Labs  Lab 06/04/18 0208 06/04/18 0222 06/05/18 0635 06/06/18 0533 06/07/18 0244 06/08/18 0250  WBC 9.0  --  9.2 11.1* 13.2* 13.0*  NEUTROABS 6.6  --   --   --   --   --   HGB 13.6 14.6 13.6 10.9* 10.9* 11.9*  HCT 43.9 43.0 43.2 34.0* 34.7* 37.7  MCV 95.4  --  93.1 91.6 93.3 93.1  PLT 289  --  255 223 220 265   Basic Metabolic Panel: Recent Labs  Lab 06/04/18 0208 06/04/18 0222 06/04/18 1424 06/05/18 0635 06/05/18 1641 06/06/18 0533 06/07/18 0244 06/08/18 0250  NA 141 142  --  141  --  140 144 144  K 3.7 3.6  --  3.9  --  3.7 3.7 3.9  CL 108 108  --  109  --  109 112* 108  CO2 19*  --   --  25  --  25 27 25   GLUCOSE 161* 157*  --  96  --  104* 128* 83  BUN 18 21  --  17  --  21 18 18   CREATININE 0.91 0.70  --  0.88  --  0.82 0.68 0.75  CALCIUM 9.3  --   --  9.1  --  8.8* 9.0 9.4  MG  --   --  1.8 1.7 1.7 1.8 2.2  --   PHOS  --   --  2.4* 3.1 2.7 3.0  --   --    GFR: Estimated Creatinine Clearance: 38.3 mL/min (by C-G formula based on SCr of 0.75 mg/dL). Recent Labs  Lab 06/04/18 0350 06/05/18 0635 06/06/18 0533 06/07/18 0244 06/08/18 0250  PROCALCITON <0.10 <0.10 <0.10  --   --   WBC  --  9.2 11.1* 13.2* 13.0*  LATICACIDVEN 1.5  --   --   --   --    Liver Function Tests: Recent Labs  Lab 06/04/18 0208  AST 33  ALT 17  ALKPHOS 75  BILITOT 0.4  PROT 7.3  ALBUMIN 3.5   No results for input(s): LIPASE, AMYLASE in the last 168 hours. Recent Labs  Lab 06/04/18 0350  AMMONIA 42*   ABG    Component Value Date/Time   PHART 7.375 06/04/2018 0447   PCO2ART 37.3 06/04/2018 0447   PO2ART 156.0 (H) 06/04/2018 0447   HCO3 21.8 06/04/2018 0447   TCO2 23 06/04/2018 0447   ACIDBASEDEF 3.0 (H)  06/04/2018 0447   O2SAT 99.0 06/04/2018 0447    Coagulation Profile: Recent Labs  Lab 06/04/18 0208  INR 1.17   Cardiac Enzymes: Recent Labs  Lab 06/04/18 0751 06/04/18 1424 06/04/18 1935  TROPONINI 0.03* 0.03* 0.04*   HbA1C: Hgb A1c MFr Bld  Date/Time Value Ref Range Status  03/01/2015 02:18 AM 5.9 (H) 4.8 - 5.6 % Final    Comment:    (NOTE)         Pre-diabetes: 5.7 - 6.4         Diabetes: >6.4         Glycemic control for adults with diabetes: <7.0    CBG: Recent Labs  Lab 06/07/18 1943 06/07/18 2334 06/08/18 0324 06/08/18 0804 06/08/18 0843  GLUCAP 85 71 72 46* 104*     Consultants: date of consult/date signed off (if applicable)/final recs  Neuro 8/30, signed off 8/31.  Rec continue keppra 250mg  BID.  Procedures: ETT 8/30 > 9/2  Significant Diagnostic Tests: CXR 8/30 > no acute process CT Head 8/30 > No acute intracranial pathology. MRI 8/30 >nad  EEG 8/30 >>no seizure act   Micro Data: Blood 8/30 >> U/A 8/30 >>NEG  Sputum 8/30 >>  Antimicrobials:  N/A    Assessment & Plan:   Respiratory insufficiency with acute Hypercarbic Respiratory Failure - s/p intubation 8/30.  Extubated 9/2 and tolerated well H/O COPD, Tobacco Dependence (heavy smoker)  Plan  Supportive care Continue BD's Tobacco cessation counseling  Hx combined Heart Failure (echo from June 2019 with EF 25 - 30%, G2DD), H/O HTN, PVD Plan Continue carvedilol, furosemide, lisinopril, hydralazine PRN  Acute Encephalopathy due to metabolic derangements vs post-ictal state - resolved Seizures - EEG 8/30 negative H/O CVA, Depression, Dementia Plan  Continue empiric keppra - continue on discharge Continue zoloft Holding preadmission trazodone for now   Best Practice / Goals of Care / Disposition.   DVT prophylaxis: Heparin  GI prophylaxis: N/A Diet: NPO - swallow eval pending Mobility: Up with assist Code Status: Limited Code, Intubation/Bipap only  Family Communication:  Son updated at bedside 8/31, 9/2    Disposition / Summary of Today's Plan 06/08/18   77 year old female with progressive decline with dementia  presents to ED unresponsive, s/p seizure. Intubated, successfully extubated 9/2 Started on keppra by neurology, would continue indefinitely.  Stable for transfer out of ICU today 9/3.  Will transfer to med-surg and ask TRH to assume care in AM 9/4 with PCCM off at that time.   Rutherford Guys, [place] [facility] Pager: [phone]  or [phone] 06/08/2018, 10:03 AM

## 2018-06-08 NOTE — Plan of Care (Signed)
Patient is on room air, started on a Dys 2 diet, and stable for transfer to Med-Surg unit.

## 2018-06-08 NOTE — Evaluation (Signed)
Clinical/Bedside Swallow Evaluation Patient Details  Name: Tammy [name] MRN: [id number] Date of Birth: 1941/09/14  Today's Date: 06/08/2018 Time: SLP Start Time (ACUTE ONLY): 1020 SLP Stop Time (ACUTE ONLY): 1035 SLP Time Calculation (min) (ACUTE ONLY): 15 min  Past Medical History:  Past Medical History:  Diagnosis Date  . CHF (congestive heart failure) (HCC)   . CKD (chronic kidney disease)   . COPD (chronic obstructive pulmonary disease) (HCC)   . Dementia   . Hypertension   . Insomnia    Past Surgical History:  Past Surgical History:  Procedure Laterality Date  . INTRAMEDULLARY (IM) NAIL INTERTROCHANTERIC Left 08/19/2015   Procedure: INTRAMEDULLARY (IM) NAIL INTERTROCHANTRIC;  Surgeon: Jodi Geralds, MD;  Location: [facility] OR;  Service: Orthopedics;  Laterality: Left;   HPI:  77 year old female with PMH of Systolic Heart Failure (EF 25-30, G2DD), HTN, Tobacco Use, PVD, Stage 3 CKD, Dementia, Depression, COPD, CVA  admitted to [facility] for seizures. ETT 8/30-9/2.    Assessment / Plan / Recommendation Clinical Impression  Pt presents with functional oropharyngeal swallow marked by active mastication despite absence of teeth, brisk swallow response, no s/s of aspiration despite large, successive boluses.  Recommend initiating a dysphagia 2 diet, thin liquids (family/staffed chopped foods PTA per RN); give meds whole in puree.  SLP will f/u briefly to ensure safety/toleration.  SLP Visit Diagnosis: Dysphagia, unspecified (R13.10)    Aspiration Risk  No limitations    Diet Recommendation   dys2, thin liquids  Medication Administration: Whole meds with puree    Other  Recommendations Oral Care Recommendations: Oral care BID   Follow up Recommendations None      Frequency and Duration min 1 x/week  1 week       Prognosis Prognosis for Safe Diet Advancement: Fair      Swallow Study   General Date of Onset: 06/04/18 HPI: 77 year old female with PMH of Systolic Heart Failure  (EF 25-30, G2DD), HTN, Tobacco Use, PVD, Stage 3 CKD, Dementia, Depression, COPD, CVA  admitted to [facility] for seizures. ETT 8/30-9/2.  Type of Study: Bedside Swallow Evaluation Previous Swallow Assessment: 2016 Diet Prior to this Study: NPO Temperature Spikes Noted: No Respiratory Status: Room air History of Recent Intubation: Yes Length of Intubations (days): 3 days Date extubated: 06/07/18 Behavior/Cognition: Alert;Cooperative;Confused Oral Cavity Assessment: Within Functional Limits Oral Care Completed by SLP: Recent completion by staff Oral Cavity - Dentition: Edentulous Vision: Functional for self-feeding Self-Feeding Abilities: Able to feed self;Needs set up Patient Positioning: Upright in bed Baseline Vocal Quality: Normal Volitional Cough: Strong Volitional Swallow: Unable to elicit    Oral/Motor/Sensory Function Overall Oral Motor/Sensory Function: Within functional limits   Ice Chips Ice chips: Not tested   Thin Liquid Thin Liquid: Within functional limits    Nectar Thick Nectar Thick Liquid: Not tested   Honey Thick Honey Thick Liquid: Not tested   Puree Puree: Within functional limits   Solid     Solid: Not tested      Blenda Mounts Laurice 06/08/2018,10:47 AM

## 2018-06-09 DIAGNOSIS — Z66 Do not resuscitate: Secondary | ICD-10-CM

## 2018-06-09 DIAGNOSIS — Z515 Encounter for palliative care: Secondary | ICD-10-CM

## 2018-06-09 DIAGNOSIS — R531 Weakness: Secondary | ICD-10-CM

## 2018-06-09 LAB — CULTURE, BLOOD (ROUTINE X 2)
Culture: NO GROWTH
Culture: NO GROWTH
Special Requests: ADEQUATE

## 2018-06-09 NOTE — Care Management Important Message (Signed)
Important Message  Patient Details  Name: Tammy [name] MRN: [id number] Date of Birth: [birth year]   Medicare Important Message Given:  Yes    Joscelynn Brutus 06/09/2018, 1:30 PM

## 2018-06-09 NOTE — NC FL2 (Signed)
[place] MEDICAID FL2 LEVEL OF CARE SCREENING TOOL     IDENTIFICATION  Patient Name: Tammy [name] Birthdate: April 29, 1941 Sex: female Admission Date (Current Location): 06/04/2018  [facility] and [place] Number:  Producer, television/film/video and Address:  [facility] [place], [address], [place], [place] [address]      Provider Number: [id number]  Attending Physician Name and Address:  Delaine Lame, MD  Relative Name and Phone Number:  Jeannetta Luiz, son, [phone]    Current Level of Care: Hospital Recommended Level of Care: Skilled Nursing Facility Prior Approval Number:    Date Approved/Denied:   PASRR Number: [id number]  Discharge Plan: SNF    Current Diagnoses: Patient Active Problem List   Diagnosis Date Noted  . Respiratory failure (HCC)   . Seizure (HCC)   . Acute encephalopathy 06/04/2018  . Status epilepticus (HCC)   . Acute metabolic encephalopathy   . Acute hypercapnic respiratory failure (HCC) 04/01/2018  . Acute CHF (congestive heart failure) (HCC) 03/29/2018  . CKD (chronic kidney disease) stage 2, GFR 60-89 ml/min 03/29/2018  . Complicated UTI (urinary tract infection)   . Acute confusional state 09/17/2016  . Rhabdomyolysis 07/02/2016  . Fall at home 07/01/2016  . Lactic acid acidosis 12/01/2015  . Metabolic acidosis 12/01/2015  . Protein-calorie malnutrition, severe (HCC) 12/01/2015  . AKI (acute kidney injury) (HCC)   . Hyponatremia   . Hypothermia   . Tachycardia   . Hyperkalemia 11/29/2015  . Acute renal failure (HCC) 11/29/2015  . Chronic combined systolic and diastolic CHF, 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/2015  . Mitral regurgitation 11/29/2015  . Elevated lactic acid level 11/29/2015  . Agitation 11/29/2015  . COPD (chronic obstructive pulmonary disease) (HCC) 11/29/2015  . Dementia 11/29/2015  . Confusion   . Acute blood loss anemia 08/21/2015  . Postoperative anemia due to acute blood loss 08/20/2015  . Displaced  intertrochanteric fracture of left femur (HCC) 08/19/2015  . Closed left hip fracture (HCC) 08/18/2015  . Secondary cardiomyopathy (HCC)   . Acute systolic congestive heart failure, NYHA class 3 (HCC) 03/02/2015  . COPD exacerbation (HCC) 03/02/2015  . Tobacco abuse 03/02/2015  . Severe protein-calorie malnutrition (HCC) 03/02/2015  . HTN (hypertension) 03/02/2015  . Congestive dilated cardiomyopathy (HCC)   . CVA (cerebral infarction) 02/28/2015  . Stroke (HCC)     Orientation RESPIRATION BLADDER Height & Weight     Self  Normal Incontinent Weight: 113 lb 8.6 oz (51.5 kg) Height:  5\' 3"  (160 cm)  BEHAVIORAL SYMPTOMS/MOOD NEUROLOGICAL BOWEL NUTRITION STATUS      Incontinent Diet(please see DC summary)  AMBULATORY STATUS COMMUNICATION OF NEEDS Skin   Extensive Assist Verbally Normal                       Personal Care Assistance Level of Assistance  Bathing, Feeding, Dressing Bathing Assistance: Maximum assistance Feeding assistance: Limited assistance Dressing Assistance: Maximum assistance     Functional Limitations Info  Sight, Hearing, Speech Sight Info: Adequate Hearing Info: Adequate Speech Info: Adequate    SPECIAL CARE FACTORS FREQUENCY  PT (By licensed PT)     PT Frequency: 5x/week              Contractures Contractures Info: Not present    Additional Factors Info  Code Status, Allergies, Psychotropic Code Status Info: Partial Allergies Info: No Known Allergies Psychotropic Info: zoloft         Current Medications (06/09/2018):  This is  the current hospital active medication list Current Facility-Administered Medications  Medication Dose Route Frequency Provider Last Rate Last Dose  . 0.9 %  sodium chloride infusion  250 mL Intravenous PRN Tobey Grim, NP 10 mL/hr at 06/08/18 1800    . acetaminophen (TYLENOL) tablet 325 mg  325 mg Oral Q6H PRN Titus Mould, RPH   325 mg at 06/08/18 1147  . albuterol (PROVENTIL) (2.5 MG/3ML) 0.083%  nebulizer solution 2.5 mg  2.5 mg Nebulization Q4H PRN Tobey Grim, NP   2.5 mg at 06/07/18 1149  . bisacodyl (DULCOLAX) suppository 10 mg  10 mg Rectal Daily PRN Arther Abbott R, MD      . carvedilol (COREG) tablet 3.125 mg  3.125 mg Oral BID WC Tobey Grim, NP   3.125 mg at 06/09/18 0827  . feeding supplement (ENSURE ENLIVE) (ENSURE ENLIVE) liquid 237 mL  237 mL Oral BID BM Agarwala, Ravi, MD      . furosemide (LASIX) tablet 20 mg  20 mg Oral Daily Desai, Rahul P, PA-C   20 mg at 06/08/18 1051  . heparin injection 5,000 Units  5,000 Units Subcutaneous Q8H Tobey Grim, NP   5,000 Units at 06/08/18 1455  . hydrALAZINE (APRESOLINE) injection 10 mg  10 mg Intravenous Q6H PRN Tobey Grim, NP   10 mg at 06/04/18 0710  . ipratropium-albuterol (DUONEB) 0.5-2.5 (3) MG/3ML nebulizer solution 3 mL  3 mL Nebulization TID Parrett, Tammy S, NP   3 mL at 06/09/18 0822  . levETIRAcetam (KEPPRA) tablet 250 mg  250 mg Oral BID Celine Mans, Rahul P, PA-C   250 mg at 06/08/18 2251  . lisinopril (PRINIVIL,ZESTRIL) tablet 2.5 mg  2.5 mg Oral Daily Desai, Rahul P, PA-C   2.5 mg at 06/08/18 1051  . MEDLINE mouth rinse  15 mL Mouth Rinse q12n4p Leslye Peer, MD      . ondansetron [facility]) injection 4 mg  4 mg Intravenous Q6H PRN Arther Abbott R, MD      . sertraline (ZOLOFT) tablet 50 mg  50 mg Oral Daily Desai, Rahul P, PA-C   50 mg at 06/08/18 1050     Discharge Medications: Please see discharge summary for a list of discharge medications.  Relevant Imaging Results:  Relevant Lab Results:   Additional Information SSN: [id number]  Abigail Butts, LCSW

## 2018-06-09 NOTE — Care Management Note (Signed)
Case Management Note  Patient Details  Name: Tammy [name] MRN: [id number] Date of Birth: Dec 23, 1940  Subjective/Objective:    Pt admitted on 06/04/18 with seizure activity.  PTA, pt needed assistance with ADLS; has son and in-home caregiver, though uncertain of availability.                   Action/Plan: PT recommending SNF at dc; CSW to follow to facilitate possible dc to SNF upon medical stability.  Expected Discharge Date:                  Expected Discharge Plan:  Skilled Nursing Facility  In-House Referral:  Clinical Social Work  Discharge planning Services  CM Consult  Post Acute Care Choice:    Choice offered to:     DME Arranged:    DME Agency:     HH Arranged:    HH Agency:     Status of Service:  In process, will continue to follow  If discussed at Long Length of Stay Meetings, dates discussed:    Additional Comments:  Quintella Baton, RN, BSN  Trauma/Neuro ICU Case Manager [phone]

## 2018-06-09 NOTE — Progress Notes (Signed)
PROGRESS NOTE    [name]  YQM:[id number] DOB: [birth year] DOA: 06/04/2018 PCP: Laurann Montana, MD    Brief Narrative:  77 year old with past medical history relevant for hypertension, chronic systolic and diastolic heart failure with EF of 25 to 30% and grade 2 diastolic dysfunction by echo on 03/30/2018, dementia, mild to moderate peripheral vascular disease who was admitted with unresponsive episode and found to have seizures and hypoglycemia.  Patient was admitted to the ICU after being intubated on 06/04/2018 and extubated on 06/07/2017.   Assessment & Plan:   Active Problems:   Acute hypercapnic respiratory failure (HCC)   Acute encephalopathy   Status epilepticus (HCC)   Acute metabolic encephalopathy   Respiratory failure (HCC)   Seizure (HCC)   #) Seizures: At this time her EEG showed no evidence of seizure activity and her MRI was negative.  Thought possibly that her seizures were provoked by hypoglycemic episode. -Continue levetiracetam 250 mg twice daily -Neurology following, appreciate recommendations  #) Chronic systolic heart failure: Last echo was approximately 3 months ago. -Continue home furosemide 20 mg daily -Continue carvedilol 3.125 mg twice daily -Continue lisinopril 2.5 mg daily  #) Hypertension: -Continue beta-blocker and ACE inhibitor per above  #) Dementia: Unfortunately patient is quite demented at this time he cannot give much of history.  She keeps saying that she is at home.  She apparently is listed as a partial code. - Palliative care consult -We will keep family appraised as patient likely need skilled nursing facility per physical therapy however she apparently has refused this in the past  #) Pain/psych: -Continue sertraline 50 mg daily  Fluids: Tolerating p.o. Electrolytes: Monitor and supplement Nutrition: Dysphagia 2 diet  Prophylaxis: Subcu heparin  Disposition: Pending discussion with family about dispel  Partial  code  Consultants:   PCCM  Neurology  Palliative care  Procedures:   Intubation 06/04/2018 to 06/07/2018  EEG 06/04/2018: Diffuse slowing, no seizure activities  Antimicrobials:   None   Subjective: Patient does not have any complaints.  She is quite suspicious when this Clinical research associate talks to her.  When asked where she is she tells me you should know where I am.  When asked what year is that she says you do not know?.  She otherwise denies any chest pain, abdominal pain, cough, congestion, rhinorrhea.  Objective: Vitals:   06/09/18 0014 06/09/18 0534 06/09/18 0738 06/09/18 0824  BP: (!) 169/63 (!) 176/69 (!) 164/78   Pulse: 78 81 83   Resp: 18 18 16    Temp:  98.7 F (37.1 C) 98.1 F (36.7 C)   TempSrc:  Oral Axillary   SpO2: 100% 96% 95% 90%  Weight:  51.5 kg    Height:        Intake/Output Summary (Last 24 hours) at 06/09/2018 1124 Last data filed at 06/09/2018 0745 Gross per 24 hour  Intake 760 ml  Output 280 ml  Net 480 ml   Filed Weights   06/07/18 0500 06/08/18 0457 06/09/18 0534  Weight: 41.8 kg 41.9 kg 51.5 kg    Examination:  General exam: Appears calm and comfortable  Respiratory system: Clear to auscultation. Respiratory effort normal. Cardiovascular system: Regular rate and rhythm, no murmurs Gastrointestinal system: Abdomen is nondistended, soft and nontender. No organomegaly or masses felt. Normal bowel sounds heard. Central nervous system: Alert but not oriented, clearly confabulating, suspicious and paranoid no focal neurological deficits. Extremities: No lower extremity edema Skin: No rashes over visible skin Psychiatry: Unable to assess due  to dementia    Data Reviewed: I have personally reviewed following labs and imaging studies  CBC: Recent Labs  Lab 06/04/18 0208 06/04/18 0222 06/05/18 4098 06/06/18 0533 06/07/18 0244 06/08/18 0250  WBC 9.0  --  9.2 11.1* 13.2* 13.0*  NEUTROABS 6.6  --   --   --   --   --   HGB 13.6 14.6 13.6 10.9*  10.9* 11.9*  HCT 43.9 43.0 43.2 34.0* 34.7* 37.7  MCV 95.4  --  93.1 91.6 93.3 93.1  PLT 289  --  255 223 220 265   Basic Metabolic Panel: Recent Labs  Lab 06/04/18 0208 06/04/18 0222 06/04/18 1424 06/05/18 0635 06/05/18 1641 06/06/18 0533 06/07/18 0244 06/08/18 0250  NA 141 142  --  141  --  140 144 144  K 3.7 3.6  --  3.9  --  3.7 3.7 3.9  CL 108 108  --  109  --  109 112* 108  CO2 19*  --   --  25  --  25 27 25   GLUCOSE 161* 157*  --  96  --  104* 128* 83  BUN 18 21  --  17  --  21 18 18   CREATININE 0.91 0.70  --  0.88  --  0.82 0.68 0.75  CALCIUM 9.3  --   --  9.1  --  8.8* 9.0 9.4  MG  --   --  1.8 1.7 1.7 1.8 2.2  --   PHOS  --   --  2.4* 3.1 2.7 3.0  --   --    GFR: Estimated Creatinine Clearance: 47.1 mL/min (by C-G formula based on SCr of 0.75 mg/dL). Liver Function Tests: Recent Labs  Lab 06/04/18 0208  AST 33  ALT 17  ALKPHOS 75  BILITOT 0.4  PROT 7.3  ALBUMIN 3.5   No results for input(s): LIPASE, AMYLASE in the last 168 hours. Recent Labs  Lab 06/04/18 0350  AMMONIA 42*   Coagulation Profile: Recent Labs  Lab 06/04/18 0208  INR 1.17   Cardiac Enzymes: Recent Labs  Lab 06/04/18 0751 06/04/18 1424 06/04/18 1935  TROPONINI 0.03* 0.03* 0.04*   BNP (last 3 results) No results for input(s): PROBNP in the last 8760 hours. HbA1C: No results for input(s): HGBA1C in the last 72 hours. CBG: Recent Labs  Lab 06/08/18 0324 06/08/18 0804 06/08/18 0843 06/08/18 1142 06/08/18 1540  GLUCAP 72 46* 104* 83 130*   Lipid Profile: No results for input(s): CHOL, HDL, LDLCALC, TRIG, CHOLHDL, LDLDIRECT in the last 72 hours. Thyroid Function Tests: No results for input(s): TSH, T4TOTAL, FREET4, T3FREE, THYROIDAB in the last 72 hours. Anemia Panel: No results for input(s): VITAMINB12, FOLATE, FERRITIN, TIBC, IRON, RETICCTPCT in the last 72 hours. Sepsis Labs: Recent Labs  Lab 06/04/18 0350 06/05/18 0635 06/06/18 0533  PROCALCITON <0.10 <0.10  <0.10  LATICACIDVEN 1.5  --   --     Recent Results (from the past 240 hour(s))  Culture, blood (routine x 2)     Status: None   Collection Time: 06/04/18  2:00 AM  Result Value Ref Range Status   Specimen Description BLOOD LEFT FOREARM  Final   Special Requests   Final    BOTTLES DRAWN AEROBIC ONLY Blood Culture adequate volume   Culture   Final    NO GROWTH 5 DAYS Performed at [facility], [address] [place], [place] [address]    Report Status 06/09/2018 FINAL  Final  Urine culture     Status: Abnormal   Collection Time: 06/04/18  2:45 AM  Result Value Ref Range Status   Specimen Description URINE, CATHETERIZED  Final   Special Requests NONE  Final   Culture (A)  Final    <10,000 COLONIES/mL INSIGNIFICANT GROWTH Performed at [facility], [address], [place], [place] [address]    Report Status 06/05/2018 FINAL  Final  Culture, blood (routine x 2)     Status: None   Collection Time: 06/04/18  3:50 AM  Result Value Ref Range Status   Specimen Description BLOOD RIGHT EJ  Final   Special Requests   Final    BOTTLES DRAWN AEROBIC AND ANAEROBIC Blood Culture results may not be optimal due to an inadequate volume of blood received in culture bottles   Culture   Final    NO GROWTH 5 DAYS Performed at [facility], [address], [place], [place] [address]    Report Status 06/09/2018 FINAL  Final  MRSA PCR Screening     Status: None   Collection Time: 06/04/18  5:07 AM  Result Value Ref Range Status   MRSA by PCR NEGATIVE NEGATIVE Final    Comment:        The GeneXpert MRSA Assay (FDA approved for NASAL specimens only), is one component of a comprehensive MRSA colonization surveillance program. It is not intended to diagnose MRSA infection nor to guide or monitor treatment for MRSA infections. Performed at [facility], [address], [place], [place] [address]          Radiology Studies: No results found.      Scheduled  Meds: . carvedilol  3.125 mg Oral BID WC  . feeding supplement (ENSURE ENLIVE)  237 mL Oral BID BM  . furosemide  20 mg Oral Daily  . heparin  5,000 Units Subcutaneous Q8H  . ipratropium-albuterol  3 mL Nebulization TID  . levETIRAcetam  250 mg Oral BID  . lisinopril  2.5 mg Oral Daily  . mouth rinse  15 mL Mouth Rinse q12n4p  . sertraline  50 mg Oral Daily   Continuous Infusions: . sodium chloride 10 mL/hr at 06/08/18 1800     LOS: 5 days    Time spent: 35    Delaine Lame, MD [facility]  If 7PM-7AM, please contact night-coverage [email or web address] Password [email or web address] 06/09/2018, 11:24 AM

## 2018-06-09 NOTE — Progress Notes (Signed)
Physical Therapy Treatment Patient Details Name: Tammy [name] MRN: [id number] DOB: 1940-12-15 Today's Date: 06/09/2018    History of Present Illness 77 year old female with PMH of Systolic Heart Failure (EF 25-30, G2DD), HTN, Tobacco Use, PVD, Stage 3 CKD, Dementia, Depression, COPD, CVA  admitted to [facility] for seizures.    PT Comments    Patient seen for therapy session. Session focused on bed level mobility, therapeutic exercise and functional task performance. Patient with some confusion during session. Son preset at end of session, spoke with son regarding concerns for d/c home. At this time, patient presents with mobility impairments that impact ability to mobilize. Prior to admission patient was able to ambulate with device and assist. After speaking with son, continue to feel SNF would be most appropriate venue disposition for further rehabilitation attempts. Son appears receptive. Educated son regarding potential for confusion with altered environment and spoke about the importance of increased activity. Will continue to see and progress as tolerated.  Follow Up Recommendations  SNF;Supervision/Assistance - 24 hour     Equipment Recommendations       Recommendations for Other Services       Precautions / Restrictions Precautions Precautions: Fall    Mobility  Bed Mobility Overal bed mobility: Needs Assistance Bed Mobility: Rolling;Supine to Sit;Sit to Supine Rolling: Max assist   Supine to sit: Total assist Sit to supine: Total assist   General bed mobility comments: Max to total assist due to confusion today.   Transfers                 General transfer comment: deferred OOB attempts due to confusion  Ambulation/Gait                 Stairs             Wheelchair Mobility    Modified Rankin (Stroke Patients Only) Modified Rankin (Stroke Patients Only) Pre-Morbid Rankin Score: Moderately severe disability Modified Rankin: Severe  disability     Balance Overall balance assessment: Needs assistance Sitting-balance support: Feet supported Sitting balance-Leahy Scale: Poor                                      Cognition Arousal/Alertness: Awake/alert Behavior During Therapy: Restless Overall Cognitive Status: History of cognitive impairments - at baseline                                        Exercises      General Comments General comments (skin integrity, edema, etc.): some hygiene performed during session      Pertinent Vitals/Pain Pain Assessment: No/denies pain    Home Living                      Prior Function            PT Goals (current goals can now be found in the care plan section) Acute Rehab PT Goals Patient Stated Goal: none stated PT Goal Formulation: Patient unable to participate in goal setting Time For Goal Achievement: 06/21/18 Potential to Achieve Goals: Fair Progress towards PT goals: Progressing toward goals(modest)    Frequency    Min 2X/week      PT Plan Discharge plan needs to be updated    Co-evaluation  AM-PAC PT "6 Clicks" Daily Activity  Outcome Measure  Difficulty turning over in bed (including adjusting bedclothes, sheets and blankets)?: Unable Difficulty moving from lying on back to sitting on the side of the bed? : Unable Difficulty sitting down on and standing up from a chair with arms (e.g., wheelchair, bedside commode, etc,.)?: Unable Help needed moving to and from a bed to chair (including a wheelchair)?: Total Help needed walking in hospital room?: Total Help needed climbing 3-5 steps with a railing? : Total 6 Click Score: 6    End of Session   Activity Tolerance: Patient tolerated treatment well;Patient limited by fatigue Patient left: in bed;with call bell/phone within reach;with bed alarm set;with nursing/sitter in room Nurse Communication: Mobility status PT Visit Diagnosis:  Difficulty in walking, not elsewhere classified (R26.2);Other symptoms and signs involving the nervous system (R29.898)     Time: 0932-3557 PT Time Calculation (min) (ACUTE ONLY): 18 min  Charges:  $Self Care/Home Management: 8-22                     Charlotte Crumb, PT DPT  Board Certified Neurologic Specialist [phone]    Fabio Asa 06/09/2018, 1:50 PM

## 2018-06-09 NOTE — Progress Notes (Signed)
  Speech Language Pathology Treatment: Dysphagia  Patient Details Name: Tammy [name] MRN: [id number] DOB: 12-10-1940 Today's Date: 06/09/2018 Time: 0900-0910 SLP Time Calculation (min) (ACUTE ONLY): 10 min  Assessment / Plan / Recommendation Clinical Impression  Pt is likely back to baseline with regard to eating/swallowing function.  Given assistance with tray set-up, she is able to self-feed.  Needs occasional cues to clear oral cavity after completion of meal.  Demonstrates active mastication of chopped foods despite absence of teeth, swift swallow response, and no s/s of aspiration.  Pt is confused; today less willing to participate and more suspicious of clinician's efforts to assist with feeding/eating.  No further SLP intervention is needed - our services will sign off.     HPI HPI: 77 year old female with PMH of Systolic Heart Failure (EF 25-30, G2DD), HTN, Tobacco Use, PVD, Stage 3 CKD, Dementia, Depression, COPD, CVA  admitted to [facility] for seizures. ETT 8/30-9/2.       SLP Plan  All goals met       Recommendations  Diet recommendations: Dysphagia 2 (fine chop);Thin liquid Liquids provided via: Cup;Straw Medication Administration: Whole meds with puree Supervision: Staff to assist with self feeding(tray set up) Compensations: Minimize environmental distractions                Oral Care Recommendations: Oral care BID Follow up Recommendations: None Plan: All goals met       GO                Juan Quam Laurice 06/09/2018, 9:51 AM

## 2018-06-09 NOTE — Consult Note (Signed)
Consultation Note Date: 06/09/2018   Patient Name: Tammy [name]  DOB: [birth year]  MRN: [id number]  Age / Sex: 76 y.o., female  PCP: Tammy Montana, MD Referring Physician: Delaine Lame, MD  Reason for Consultation: Establishing goals of care and Psychosocial/spiritual support  HPI/Patient Profile: 77 y.o. female  admitted on 06/04/2018 with past medical history significant for systolic CHF, hypertension, COPD, dementia who was found unresponsive and was brought to the emergency department with active seizures.    No clear etiology identified, head CT negative MRI brain 8/30 negative.    Per family patient has had significant physical, functional, and cognitive decline over the last several months.  It has become increasingly difficult to manage her care at home.  Family face treatment option decisions, advanced directive decisions, and anticipatory care needs.   Clinical Assessment and Goals of Care:  This NP Tammy [name] reviewed medical records, received report from team, assessed the patient and then meet at the patient's bedside along with only living son and main decision maker to discuss diagnosis, prognosis, GOC, EOL wishes disposition and options.  Discussed patient with Tammy [name]/caregiver at home.  Concept of  Palliative Care were discussed  A detailed discussion was had today regarding advanced directives.  Concepts specific to code status, artifical feeding and hydration, continued IV antibiotics and rehospitalization was had.    The difference between an aggressive medical intervention path and a palliative comfort path for this patient at this time was detailed Values and goals of care important to patient and family were attempted to be elicited.  MOST form introduced and a Hard Choices booklet left for review.  Natural trajectory and expectations at EOL were discussed.   Questions and concerns addressed.   Family encouraged to call with questions or concerns.    PMT will continue to support holistically.  No documented healthcare power of attorney on file.  Patient's son Tammy [name] is her only living child and he is her main Management consultant.  He tells me he has documented power of attorney and will look for it and bring it in for scanning  SUMMARY OF RECOMMENDATIONS    Main focus of care is comfort and dignity.  Family is open to SNF for short-term rehab.  They are open to hospice services when patient is eligible.    Code Status/Advance Care Planning:  DNR-documented today   Symptom Management:   Weakness: SNF for short-term rehab  Palliative Prophylaxis:   Aspiration, Bowel Regimen, Delirium Protocol and Oral Care  Additional Recommendations (Limitations, Scope, Preferences):  Avoid Hospitalization and No Artificial Feeding  Psycho-social/Spiritual:   Desire for further Chaplaincy support:no  Additional Recommendations: Education on Hospice  Prognosis:   Unable to determine  Discharge Planning: Recommend palliative services to follow at facility   Skilled Nursing Facility for rehab with Palliative care service follow-up      Primary Diagnoses: Present on Admission: . Acute encephalopathy   I have reviewed the medical record, interviewed the patient and family, and examined the patient.  The following aspects are pertinent.  Past Medical History:  Diagnosis Date  . CHF (congestive heart failure) (HCC)   . CKD (chronic kidney disease)   . COPD (chronic obstructive pulmonary disease) (HCC)   . Dementia   . Hypertension   . Insomnia    Social History   Socioeconomic History  . Marital status: Single    Spouse name: Not on file  . Number of children: Not on file  . Years of education: Not on file  . Highest education level: Not on file  Occupational History  . Occupation: retired  Engineer, production  . Financial  resource strain: Not on file  . Food insecurity:    Worry: Not on file    Inability: Not on file  . Transportation needs:    Medical: Not on file    Non-medical: Not on file  Tobacco Use  . Smoking status: Current Every Day Smoker    Packs/day: 2.00    Years: 50.00    Pack years: 100.00    Types: Cigarettes  . Smokeless tobacco: Never Used  Substance and Sexual Activity  . Alcohol use: No  . Drug use: No  . Sexual activity: Not on file  Lifestyle  . Physical activity:    Days per week: Not on file    Minutes per session: Not on file  . Stress: Not on file  Relationships  . Social connections:    Talks on phone: Not on file    Gets together: Not on file    Attends religious service: Not on file    Active member of club or organization: Not on file    Attends meetings of clubs or organizations: Not on file    Relationship status: Not on file  Other Topics Concern  . Not on file  Social History Narrative  . Not on file   Family History  Problem Relation Age of Onset  . Congestive Heart Failure Son 55   Scheduled Meds: . carvedilol  3.125 mg Oral BID WC  . feeding supplement (ENSURE ENLIVE)  237 mL Oral BID BM  . furosemide  20 mg Oral Daily  . heparin  5,000 Units Subcutaneous Q8H  . ipratropium-albuterol  3 mL Nebulization TID  . levETIRAcetam  250 mg Oral BID  . lisinopril  2.5 mg Oral Daily  . mouth rinse  15 mL Mouth Rinse q12n4p  . sertraline  50 mg Oral Daily   Continuous Infusions: . sodium chloride 10 mL/hr at 06/08/18 1800   PRN Meds:.sodium chloride, acetaminophen, albuterol, bisacodyl, hydrALAZINE, ondansetron (ZOFRAN) IV Medications Prior to Admission:  Prior to Admission medications   Medication Sig Start Date End Date Taking? Authorizing Provider  carvedilol (COREG) 3.125 MG tablet Take 1 tablet (3.125 mg total) by mouth 2 (two) times daily with a meal. 03/04/15  Yes Rinehuls, David L, PA-C  diphenhydramine-acetaminophen (TYLENOL PM) 25-500 MG TABS  tablet Take 1 tablet by mouth at bedtime as needed (sleep).   Yes [provider]  feeding supplement, ENSURE ENLIVE, (ENSURE ENLIVE) LIQD Take 237 mLs by mouth 3 (three) times daily between meals. 12/01/15  Yes Dhungel, Nishant, MD  furosemide (LASIX) 20 MG tablet Take 20 mg by mouth daily.   Yes [provider]  lisinopril (PRINIVIL,ZESTRIL) 2.5 MG tablet Take 2.5 mg by mouth daily. 09/13/16  Yes [provider]  sertraline (ZOLOFT) 50 MG tablet Take 50 mg by mouth daily. 09/13/16  Yes [provider]  traZODone (DESYREL) 50 MG  tablet Take 25 mg by mouth at bedtime as needed for sleep.  09/13/16  Yes [provider]  furosemide (LASIX) 40 MG tablet Take 1 tablet (40 mg total) by mouth daily. Patient not taking: Reported on 06/04/2018 04/02/18   Tyrone Nine, MD   No Known Allergies Review of Systems  Unable to perform ROS: Dementia    Physical Exam  Constitutional: She appears cachectic. She appears ill.  Cardiovascular: Normal rate, regular rhythm and normal heart sounds.  Pulmonary/Chest: Effort normal and breath sounds normal.  Musculoskeletal:  -Generalized weakness and muscle atrophy  Skin: Skin is warm and dry.    Vital Signs: BP (!) 164/78 (BP Location: Right Arm)   Pulse 83   Temp 98.1 F (36.7 C) (Axillary)   Resp 16   Ht 5\' 3"  (1.6 m)   Wt 51.5 kg   SpO2 90%   BMI 20.11 kg/m  Pain Scale: 0-10   Pain Score: 0-No pain   SpO2: SpO2: 90 % O2 Device:SpO2: 90 % O2 Flow Rate: .O2 Flow Rate (L/min): 1 L/min  IO: Intake/output summary:   Intake/Output Summary (Last 24 hours) at 06/09/2018 0929 Last data filed at 06/08/2018 2200 Gross per 24 hour  Intake 980 ml  Output 280 ml  Net 700 ml    LBM: Last BM Date: 06/08/18 Baseline Weight: Weight: 50 kg Most recent weight: Weight: 51.5 kg     Palliative Assessment/Data: 30% at best   Discussed with Dr Clearnce Sorrel  Time In: 1145 Time Out: 1300 Time Total: 75 minutes Greater  than 50%  of this time was spent counseling and coordinating care related to the above assessment and plan.  Signed by: Tammy Creed, NP   Please contact Palliative Medicine Team phone at [phone] for questions and concerns.  For individual provider: See Loretha Stapler

## 2018-06-10 LAB — GLUCOSE, CAPILLARY: Glucose-Capillary: 133 mg/dL — ABNORMAL HIGH (ref 70–99)

## 2018-06-10 NOTE — Progress Notes (Signed)
PROGRESS NOTE    [name]  XBM:[id number] DOB: August 01, 1941 DOA: 06/04/2018 PCP: Laurann Montana, MD    Brief Narrative:  77 year old with past medical history relevant for hypertension, chronic systolic and diastolic heart failure with EF of 25 to 30% and grade 2 diastolic dysfunction by echo on 03/30/2018, dementia, mild to moderate peripheral vascular disease who was admitted with unresponsive episode and found to have seizures and hypoglycemia.  Patient was admitted to the ICU after being intubated on 06/04/2018 and extubated on 06/07/2017.   Assessment & Plan:   Active Problems:   Acute hypercapnic respiratory failure (HCC)   Acute encephalopathy   Status epilepticus (HCC)   Acute metabolic encephalopathy   Respiratory failure (HCC)   Seizure (HCC)   Palliative care by specialist   DNR (do not resuscitate)   Weakness generalized   #) Seizures: At this time her EEG showed no evidence of seizure activity and her MRI was negative.  Thought possibly that her seizures were provoked by hypoglycemic episode. -Continue levetiracetam 250 mg twice daily -Neurology following, appreciate recommendations -Pending discharge to skilled nursing facility  #) Chronic systolic heart failure: Last echo was approximately 3 months ago. -Continue home furosemide 20 mg daily -Continue carvedilol 3.125 mg twice daily -Continue lisinopril 2.5 mg daily  #) Hypertension: -Continue beta-blocker and ACE inhibitor per above  #) Dementia: Unfortunately patient is quite demented at this time he cannot give much of history.  She keeps saying that she is at home.   - Palliative care consult -Pending discharge to skilled nursing facility  #) Pain/psych: -Continue sertraline 50 mg daily  Fluids: Tolerating p.o. Electrolytes: Monitor and supplement Nutrition: Dysphagia 2 diet  Prophylaxis: Subcu heparin  Disposition: Pending skilled nursing facility with rehab and outpatient hospice/palliative  care  DO NOT RESUSCITATE Consultants:   PCCM  Neurology  Palliative care  Procedures:   Intubation 06/04/2018 to 06/07/2018  EEG 06/04/2018: Diffuse slowing, no seizure activities  Antimicrobials:   None   Subjective: Patient does not have any complaints.  When asked orientation questions he only says why you keep asking these questions.  She denies any cough, congestion, rhinorrhea, nausea, vomiting, diarrhea. Objective: Vitals:   06/09/18 1613 06/09/18 2335 06/10/18 0400 06/10/18 0745  BP: 139/64 131/63 (!) 154/76 (!) 171/73  Pulse: 96 92 79 78  Resp: 16   20  Temp: 98.1 F (36.7 C) 98.2 F (36.8 C) 98.6 F (37 C) 98.5 F (36.9 C)  TempSrc: Oral Oral Oral Oral  SpO2: 99% 99% 99% 97%  Weight:      Height:        Intake/Output Summary (Last 24 hours) at 06/10/2018 1027 Last data filed at 06/10/2018 0624 Gross per 24 hour  Intake 200 ml  Output -  Net 200 ml   Filed Weights   06/07/18 0500 06/08/18 0457 06/09/18 0534  Weight: 41.8 kg 41.9 kg 51.5 kg    Examination:  General exam: Appears calm and comfortable  Respiratory system: Clear to auscultation. Respiratory effort normal. Cardiovascular system: Regular rate and rhythm, no murmurs Gastrointestinal system: Abdomen is nondistended, soft and nontender. No organomegaly or masses felt. Normal bowel sounds heard. Central nervous system: Alert but not oriented, clearly confabulating, suspicious and paranoid no focal neurological deficits. Extremities: No lower extremity edema Skin: No rashes over visible skin Psychiatry: Unable to assess due to dementia    Data Reviewed: I have personally reviewed following labs and imaging studies  CBC: Recent Labs  Lab 06/04/18 0208  06/04/18 0222 06/05/18 8299 06/06/18 0533 06/07/18 0244 06/08/18 0250  WBC 9.0  --  9.2 11.1* 13.2* 13.0*  NEUTROABS 6.6  --   --   --   --   --   HGB 13.6 14.6 13.6 10.9* 10.9* 11.9*  HCT 43.9 43.0 43.2 34.0* 34.7* 37.7  MCV 95.4   --  93.1 91.6 93.3 93.1  PLT 289  --  255 223 220 265   Basic Metabolic Panel: Recent Labs  Lab 06/04/18 0208 06/04/18 0222 06/04/18 1424 06/05/18 0635 06/05/18 1641 06/06/18 0533 06/07/18 0244 06/08/18 0250  NA 141 142  --  141  --  140 144 144  K 3.7 3.6  --  3.9  --  3.7 3.7 3.9  CL 108 108  --  109  --  109 112* 108  CO2 19*  --   --  25  --  25 27 25   GLUCOSE 161* 157*  --  96  --  104* 128* 83  BUN 18 21  --  17  --  21 18 18   CREATININE 0.91 0.70  --  0.88  --  0.82 0.68 0.75  CALCIUM 9.3  --   --  9.1  --  8.8* 9.0 9.4  MG  --   --  1.8 1.7 1.7 1.8 2.2  --   PHOS  --   --  2.4* 3.1 2.7 3.0  --   --    GFR: Estimated Creatinine Clearance: 47.1 mL/min (by C-G formula based on SCr of 0.75 mg/dL). Liver Function Tests: Recent Labs  Lab 06/04/18 0208  AST 33  ALT 17  ALKPHOS 75  BILITOT 0.4  PROT 7.3  ALBUMIN 3.5   No results for input(s): LIPASE, AMYLASE in the last 168 hours. Recent Labs  Lab 06/04/18 0350  AMMONIA 42*   Coagulation Profile: Recent Labs  Lab 06/04/18 0208  INR 1.17   Cardiac Enzymes: Recent Labs  Lab 06/04/18 0751 06/04/18 1424 06/04/18 1935  TROPONINI 0.03* 0.03* 0.04*   BNP (last 3 results) No results for input(s): PROBNP in the last 8760 hours. HbA1C: No results for input(s): HGBA1C in the last 72 hours. CBG: Recent Labs  Lab 06/08/18 0324 06/08/18 0804 06/08/18 0843 06/08/18 1142 06/08/18 1540  GLUCAP 72 46* 104* 83 130*   Lipid Profile: No results for input(s): CHOL, HDL, LDLCALC, TRIG, CHOLHDL, LDLDIRECT in the last 72 hours. Thyroid Function Tests: No results for input(s): TSH, T4TOTAL, FREET4, T3FREE, THYROIDAB in the last 72 hours. Anemia Panel: No results for input(s): VITAMINB12, FOLATE, FERRITIN, TIBC, IRON, RETICCTPCT in the last 72 hours. Sepsis Labs: Recent Labs  Lab 06/04/18 0350 06/05/18 0635 06/06/18 0533  PROCALCITON <0.10 <0.10 <0.10  LATICACIDVEN 1.5  --   --     Recent Results (from the  past 240 hour(s))  Culture, blood (routine x 2)     Status: None   Collection Time: 06/04/18  2:00 AM  Result Value Ref Range Status   Specimen Description BLOOD LEFT FOREARM  Final   Special Requests   Final    BOTTLES DRAWN AEROBIC ONLY Blood Culture adequate volume   Culture   Final    NO GROWTH 5 DAYS Performed at [facility], [address] [place], [place] [address]    Report Status 06/09/2018 FINAL  Final  Urine culture     Status: Abnormal   Collection Time: 06/04/18  2:45 AM  Result Value Ref Range Status  Specimen Description URINE, CATHETERIZED  Final   Special Requests NONE  Final   Culture (A)  Final    <10,000 COLONIES/mL INSIGNIFICANT GROWTH Performed at [facility], [address], [place], [place] [address]    Report Status 06/05/2018 FINAL  Final  Culture, blood (routine x 2)     Status: None   Collection Time: 06/04/18  3:50 AM  Result Value Ref Range Status   Specimen Description BLOOD RIGHT EJ  Final   Special Requests   Final    BOTTLES DRAWN AEROBIC AND ANAEROBIC Blood Culture results may not be optimal due to an inadequate volume of blood received in culture bottles   Culture   Final    NO GROWTH 5 DAYS Performed at [facility], [address] [place], [place] [address]    Report Status 06/09/2018 FINAL  Final  MRSA PCR Screening     Status: None   Collection Time: 06/04/18  5:07 AM  Result Value Ref Range Status   MRSA by PCR NEGATIVE NEGATIVE Final    Comment:        The GeneXpert MRSA Assay (FDA approved for NASAL specimens only), is one component of a comprehensive MRSA colonization surveillance program. It is not intended to diagnose MRSA infection nor to guide or monitor treatment for MRSA infections. Performed at [facility], [address] [place], [place] [address]          Radiology Studies: No results found.      Scheduled Meds: . carvedilol  3.125 mg Oral BID WC  . feeding supplement  (ENSURE ENLIVE)  237 mL Oral BID BM  . furosemide  20 mg Oral Daily  . heparin  5,000 Units Subcutaneous Q8H  . levETIRAcetam  250 mg Oral BID  . lisinopril  2.5 mg Oral Daily  . mouth rinse  15 mL Mouth Rinse q12n4p  . sertraline  50 mg Oral Daily   Continuous Infusions: . sodium chloride 10 mL/hr at 06/08/18 1800     LOS: 6 days    Time spent: 35    Delaine Lame, MD [facility]  If 7PM-7AM, please contact night-coverage [email or web address] Password [email or web address] 06/10/2018, 10:27 AM

## 2018-06-10 NOTE — Progress Notes (Signed)
CSW met with patient's son at bedside and provided SNF bed offers. Son to review and follow up with choice. Patient will need [facility] authorization once facility selected. CSW to follow.  Estanislado Emms, [place]

## 2018-06-11 LAB — GLUCOSE, CAPILLARY
Glucose-Capillary: 105 mg/dL — ABNORMAL HIGH (ref 70–99)
Glucose-Capillary: 108 mg/dL — ABNORMAL HIGH (ref 70–99)
Glucose-Capillary: 122 mg/dL — ABNORMAL HIGH (ref 70–99)
Glucose-Capillary: 153 mg/dL — ABNORMAL HIGH (ref 70–99)
Glucose-Capillary: 54 mg/dL — ABNORMAL LOW (ref 70–99)
Glucose-Capillary: 96 mg/dL (ref 70–99)

## 2018-06-11 NOTE — Progress Notes (Addendum)
3:24 pm Patient's son called CSW and indicated he has chosen [facility]. CSW left message with [facility] admissions requesting they start [facility] auth. Auth required before patient admits to SNF. CSW to follow.  11:41 am Patient's son continuing to review SNF bed offers. He plans to visit [facility] today and call CSW back with choice. CSW advised that bed choice is needed to start [facility] authorization. CSW to follow.  Abigail Butts, LCSWA [phone]

## 2018-06-11 NOTE — Progress Notes (Signed)
PROGRESS NOTE    [name]  PNT:[id number] DOB: 1940/11/09 DOA: 06/04/2018 PCP: Laurann Montana, MD    Brief Narrative:  77 year old with past medical history relevant for hypertension, chronic systolic and diastolic heart failure with EF of 25 to 30% and grade 2 diastolic dysfunction by echo on 03/30/2018, dementia, mild to moderate peripheral vascular disease who was admitted with unresponsive episode and found to have seizures and hypoglycemia.  Patient was admitted to the ICU after being intubated on 06/04/2018 and extubated on 06/07/2017.   Assessment & Plan:   Active Problems:   Acute hypercapnic respiratory failure (HCC)   Acute encephalopathy   Status epilepticus (HCC)   Acute metabolic encephalopathy   Respiratory failure (HCC)   Seizure (HCC)   Palliative care by specialist   DNR (do not resuscitate)   Weakness generalized   #) Seizures: At this time her EEG showed no evidence of seizure activity and her MRI was negative.  Thought possibly that her seizures were provoked by hypoglycemic episode. -Continue levetiracetam 250 mg twice daily -Neurology following, appreciate recommendations -Pending discharge to skilled nursing facility  #) Chronic systolic heart failure: Last echo was approximately 3 months ago. -Continue home furosemide 20 mg daily -Continue carvedilol 3.125 mg twice daily -Continue lisinopril 2.5 mg daily  #) Hypertension: -Continue beta-blocker and ACE inhibitor per above  #) Dementia: Unfortunately patient is quite demented at this time he cannot give much of history.  She keeps saying that she is at home.   - Palliative care consult -Pending discharge to skilled nursing facility  #) Pain/psych: -Continue sertraline 50 mg daily  Fluids: Tolerating p.o. Electrolytes: Monitor and supplement Nutrition: Dysphagia 2 diet  Prophylaxis: Subcu heparin  Disposition: Pending skilled nursing facility with rehab and outpatient hospice/palliative  care  DO NOT RESUSCITATE Consultants:   PCCM  Neurology  Palliative care  Procedures:   Intubation 06/04/2018 to 06/07/2018  EEG 06/04/2018: Diffuse slowing, no seizure activities  Antimicrobials:   None   Subjective: Patient does not have any complaints.  She is sleeping in bed.  She continues not be oriented to place or situation. Objective: Vitals:   06/11/18 0500 06/11/18 0530 06/11/18 0744 06/11/18 0941  BP:  (!) 161/88 (!) 184/77 140/63  Pulse:  92 81 82  Resp:  18 16   Temp:  98.6 F (37 C) 98.4 F (36.9 C)   TempSrc:  Oral Axillary   SpO2:  99% 99%   Weight: 52.7 kg     Height:       No intake or output data in the 24 hours ending 06/11/18 1049 Filed Weights   06/09/18 0534 06/10/18 1812 06/11/18 0500  Weight: 51.5 kg 51.7 kg 52.7 kg    Examination:  General exam: Appears calm and comfortable  Respiratory system: Clear to auscultation. Respiratory effort normal. Cardiovascular system: Regular rate and rhythm, no murmurs Gastrointestinal system: Abdomen is nondistended, soft and nontender. No organomegaly or masses felt. Normal bowel sounds heard. Central nervous system: Alert but not oriented, clearly confabulating, suspicious and paranoid no focal neurological deficits. Extremities: No lower extremity edema Skin: No rashes over visible skin Psychiatry: Unable to assess due to dementia    Data Reviewed: I have personally reviewed following labs and imaging studies  CBC: Recent Labs  Lab 06/05/18 0635 06/06/18 0533 06/07/18 0244 06/08/18 0250  WBC 9.2 11.1* 13.2* 13.0*  HGB 13.6 10.9* 10.9* 11.9*  HCT 43.2 34.0* 34.7* 37.7  MCV 93.1 91.6 93.3 93.1  PLT 255  223 220 265   Basic Metabolic Panel: Recent Labs  Lab 06/04/18 1424 06/05/18 0635 06/05/18 1641 06/06/18 0533 06/07/18 0244 06/08/18 0250  NA  --  141  --  140 144 144  K  --  3.9  --  3.7 3.7 3.9  CL  --  109  --  109 112* 108  CO2  --  25  --  25 27 25   GLUCOSE  --  96  --   104* 128* 83  BUN  --  17  --  21 18 18   CREATININE  --  0.88  --  0.82 0.68 0.75  CALCIUM  --  9.1  --  8.8* 9.0 9.4  MG 1.8 1.7 1.7 1.8 2.2  --   PHOS 2.4* 3.1 2.7 3.0  --   --    GFR: Estimated Creatinine Clearance: 47.9 mL/min (by C-G formula based on SCr of 0.75 mg/dL). Liver Function Tests: No results for input(s): AST, ALT, ALKPHOS, BILITOT, PROT, ALBUMIN in the last 168 hours. No results for input(s): LIPASE, AMYLASE in the last 168 hours. No results for input(s): AMMONIA in the last 168 hours. Coagulation Profile: No results for input(s): INR, PROTIME in the last 168 hours. Cardiac Enzymes: Recent Labs  Lab 06/04/18 1424 06/04/18 1935  TROPONINI 0.03* 0.04*   BNP (last 3 results) No results for input(s): PROBNP in the last 8760 hours. HbA1C: No results for input(s): HGBA1C in the last 72 hours. CBG: Recent Labs  Lab 06/08/18 0843 06/08/18 1142 06/08/18 1540 06/10/18 2301 06/11/18 0542  GLUCAP 104* 83 130* 133* 96   Lipid Profile: No results for input(s): CHOL, HDL, LDLCALC, TRIG, CHOLHDL, LDLDIRECT in the last 72 hours. Thyroid Function Tests: No results for input(s): TSH, T4TOTAL, FREET4, T3FREE, THYROIDAB in the last 72 hours. Anemia Panel: No results for input(s): VITAMINB12, FOLATE, FERRITIN, TIBC, IRON, RETICCTPCT in the last 72 hours. Sepsis Labs: Recent Labs  Lab 06/05/18 0635 06/06/18 0533  PROCALCITON <0.10 <0.10    Recent Results (from the past 240 hour(s))  Culture, blood (routine x 2)     Status: None   Collection Time: 06/04/18  2:00 AM  Result Value Ref Range Status   Specimen Description BLOOD LEFT FOREARM  Final   Special Requests   Final    BOTTLES DRAWN AEROBIC ONLY Blood Culture adequate volume   Culture   Final    NO GROWTH 5 DAYS Performed at [facility], [address], [place], [place] [address]    Report Status 06/09/2018 FINAL  Final  Urine culture     Status: Abnormal   Collection Time: 06/04/18  2:45 AM  Result  Value Ref Range Status   Specimen Description URINE, CATHETERIZED  Final   Special Requests NONE  Final   Culture (A)  Final    <10,000 COLONIES/mL INSIGNIFICANT GROWTH Performed at [facility], [address] [place], [place] [address]    Report Status 06/05/2018 FINAL  Final  Culture, blood (routine x 2)     Status: None   Collection Time: 06/04/18  3:50 AM  Result Value Ref Range Status   Specimen Description BLOOD RIGHT EJ  Final   Special Requests   Final    BOTTLES DRAWN AEROBIC AND ANAEROBIC Blood Culture results may not be optimal due to an inadequate volume of blood received in culture bottles   Culture   Final    NO GROWTH 5 DAYS Performed at [facility]  Lab, [address], [place], [place] [address]    Report Status 06/09/2018 FINAL  Final  MRSA PCR Screening     Status: None   Collection Time: 06/04/18  5:07 AM  Result Value Ref Range Status   MRSA by PCR NEGATIVE NEGATIVE Final    Comment:        The GeneXpert MRSA Assay (FDA approved for NASAL specimens only), is one component of a comprehensive MRSA colonization surveillance program. It is not intended to diagnose MRSA infection nor to guide or monitor treatment for MRSA infections. Performed at [facility], [address], [place], [place] [address]          Radiology Studies: No results found.      Scheduled Meds: . carvedilol  3.125 mg Oral BID WC  . feeding supplement (ENSURE ENLIVE)  237 mL Oral BID BM  . furosemide  20 mg Oral Daily  . heparin  5,000 Units Subcutaneous Q8H  . levETIRAcetam  250 mg Oral BID  . lisinopril  2.5 mg Oral Daily  . mouth rinse  15 mL Mouth Rinse q12n4p  . sertraline  50 mg Oral Daily   Continuous Infusions: . sodium chloride 10 mL/hr at 06/08/18 1800     LOS: 7 days    Time spent: 35    Delaine Lame, MD [facility]  If 7PM-7AM, please contact night-coverage [email or web address] Password [email or web address] 06/11/2018, 10:49 AM

## 2018-06-11 NOTE — Plan of Care (Signed)
Pt sat in recliner for approx 2 hours. Pt tolerated well.

## 2018-06-12 LAB — GLUCOSE, CAPILLARY
Glucose-Capillary: 89 mg/dL (ref 70–99)
Glucose-Capillary: 114 mg/dL — ABNORMAL HIGH (ref 70–99)
Glucose-Capillary: 105 mg/dL — ABNORMAL HIGH (ref 70–99)
Glucose-Capillary: 107 mg/dL — ABNORMAL HIGH (ref 70–99)
Glucose-Capillary: 119 mg/dL — ABNORMAL HIGH (ref 70–99)
Glucose-Capillary: 109 mg/dL — ABNORMAL HIGH (ref 70–99)

## 2018-06-12 NOTE — Progress Notes (Signed)
PROGRESS NOTE    [name]  ZOX:[id number] DOB: 1941/06/06 DOA: 06/04/2018 PCP: Laurann Montana, MD    Brief Narrative:  77 year old with past medical history relevant for hypertension, chronic systolic and diastolic heart failure with EF of 25 to 30% and grade 2 diastolic dysfunction by echo on 03/30/2018, dementia, mild to moderate peripheral vascular disease who was admitted with unresponsive episode and found to have seizures and hypoglycemia.  Patient was admitted to the ICU after being intubated on 06/04/2018 and extubated on 06/07/2017.   Assessment & Plan:   Active Problems:   Acute hypercapnic respiratory failure (HCC)   Acute encephalopathy   Status epilepticus (HCC)   Acute metabolic encephalopathy   Respiratory failure (HCC)   Seizure (HCC)   Palliative care by specialist   DNR (do not resuscitate)   Weakness generalized   #) Seizures: At this time her EEG showed no evidence of seizure activity and her MRI was negative.  Thought possibly that her seizures were provoked by hypoglycemic episode. -Continue levetiracetam 250 mg twice daily -Neurology following, appreciate recommendations -Pending discharge to skilled nursing facility  #) Chronic systolic heart failure: Last echo was approximately 3 months ago. -Continue home furosemide 20 mg daily -Continue carvedilol 3.125 mg twice daily -Continue lisinopril 2.5 mg daily  #) Hypertension: -Continue beta-blocker and ACE inhibitor per above  #) Dementia:  - Palliative care consult -Pending discharge to skilled nursing facility  #) Pain/psych: -Continue sertraline 50 mg daily  Fluids: Tolerating p.o. Electrolytes: Monitor and supplement Nutrition: Dysphagia 2 diet  Prophylaxis: Subcu heparin  Disposition: Pending skilled nursing facility with rehab and outpatient hospice/palliative care  DO NOT RESUSCITATE Consultants:   PCCM  Neurology  Palliative care  Procedures:   Intubation 06/04/2018 to  06/07/2018  EEG 06/04/2018: Diffuse slowing, no seizure activities  Antimicrobials:   None   Subjective: Patient does not have any complaints.  She had breakfast this morning and tolerated well.  She denies any cough, congestion, rhinorrhea.  Objective: Vitals:   06/11/18 2337 06/11/18 2337 06/12/18 0341 06/12/18 0743  BP: (!) 133/58 (!) 133/58 (!) 158/63 (!) 182/89  Pulse: 79 79 80 82  Resp: 18 20 18 16   Temp: 98.2 F (36.8 C) 98.2 F (36.8 C) 98 F (36.7 C)   TempSrc: Oral Oral Oral   SpO2: 99% 99% 96% 99%  Weight:   52.4 kg   Height:        Intake/Output Summary (Last 24 hours) at 06/12/2018 1011 Last data filed at 06/12/2018 0800 Gross per 24 hour  Intake 480 ml  Output -  Net 480 ml   Filed Weights   06/10/18 1812 06/11/18 0500 06/12/18 0341  Weight: 51.7 kg 52.7 kg 52.4 kg    Examination:  General exam: Appears calm and comfortable  Respiratory system: Clear to auscultation. Respiratory effort normal. Cardiovascular system: Regular rate and rhythm, no murmurs Gastrointestinal system: Abdomen is nondistended, soft and nontender. No organomegaly or masses felt. Normal bowel sounds heard. Central nervous system: Alert but not oriented, patient confabulates somewhat.  Less suspicious and paranoid than before Extremities: No lower extremity edema Skin: No rashes over visible skin Psychiatry: Unable to assess due to dementia    Data Reviewed: I have personally reviewed following labs and imaging studies  CBC: Recent Labs  Lab 06/06/18 0533 06/07/18 0244 06/08/18 0250  WBC 11.1* 13.2* 13.0*  HGB 10.9* 10.9* 11.9*  HCT 34.0* 34.7* 37.7  MCV 91.6 93.3 93.1  PLT 223 220 265  Basic Metabolic Panel: Recent Labs  Lab 06/05/18 1641 06/06/18 0533 06/07/18 0244 06/08/18 0250  NA  --  140 144 144  K  --  3.7 3.7 3.9  CL  --  109 112* 108  CO2  --  25 27 25   GLUCOSE  --  104* 128* 83  BUN  --  21 18 18   CREATININE  --  0.82 0.68 0.75  CALCIUM  --  8.8*  9.0 9.4  MG 1.7 1.8 2.2  --   PHOS 2.7 3.0  --   --    GFR: Estimated Creatinine Clearance: 47.9 mL/min (by C-G formula based on SCr of 0.75 mg/dL). Liver Function Tests: No results for input(s): AST, ALT, ALKPHOS, BILITOT, PROT, ALBUMIN in the last 168 hours. No results for input(s): LIPASE, AMYLASE in the last 168 hours. No results for input(s): AMMONIA in the last 168 hours. Coagulation Profile: No results for input(s): INR, PROTIME in the last 168 hours. Cardiac Enzymes: No results for input(s): CKTOTAL, CKMB, CKMBINDEX, TROPONINI in the last 168 hours. BNP (last 3 results) No results for input(s): PROBNP in the last 8760 hours. HbA1C: No results for input(s): HGBA1C in the last 72 hours. CBG: Recent Labs  Lab 06/11/18 1610 06/11/18 1946 06/11/18 2345 06/12/18 0342 06/12/18 0741  GLUCAP 108* 105* 122* 89 114*   Lipid Profile: No results for input(s): CHOL, HDL, LDLCALC, TRIG, CHOLHDL, LDLDIRECT in the last 72 hours. Thyroid Function Tests: No results for input(s): TSH, T4TOTAL, FREET4, T3FREE, THYROIDAB in the last 72 hours. Anemia Panel: No results for input(s): VITAMINB12, FOLATE, FERRITIN, TIBC, IRON, RETICCTPCT in the last 72 hours. Sepsis Labs: Recent Labs  Lab 06/06/18 0533  PROCALCITON <0.10    Recent Results (from the past 240 hour(s))  Culture, blood (routine x 2)     Status: None   Collection Time: 06/04/18  2:00 AM  Result Value Ref Range Status   Specimen Description BLOOD LEFT FOREARM  Final   Special Requests   Final    BOTTLES DRAWN AEROBIC ONLY Blood Culture adequate volume   Culture   Final    NO GROWTH 5 DAYS Performed at [facility], [address] [place], [place] [address]    Report Status 06/09/2018 FINAL  Final  Urine culture     Status: Abnormal   Collection Time: 06/04/18  2:45 AM  Result Value Ref Range Status   Specimen Description URINE, CATHETERIZED  Final   Special Requests NONE  Final   Culture (A)  Final    <10,000  COLONIES/mL INSIGNIFICANT GROWTH Performed at [facility], [address] [place], [place] [address]    Report Status 06/05/2018 FINAL  Final  Culture, blood (routine x 2)     Status: None   Collection Time: 06/04/18  3:50 AM  Result Value Ref Range Status   Specimen Description BLOOD RIGHT EJ  Final   Special Requests   Final    BOTTLES DRAWN AEROBIC AND ANAEROBIC Blood Culture results may not be optimal due to an inadequate volume of blood received in culture bottles   Culture   Final    NO GROWTH 5 DAYS Performed at [facility], [address] [place], [place] [address]    Report Status 06/09/2018 FINAL  Final  MRSA PCR Screening     Status: None   Collection Time: 06/04/18  5:07 AM  Result Value Ref Range Status   MRSA by PCR NEGATIVE NEGATIVE Final  Comment:        The GeneXpert MRSA Assay (FDA approved for NASAL specimens only), is one component of a comprehensive MRSA colonization surveillance program. It is not intended to diagnose MRSA infection nor to guide or monitor treatment for MRSA infections. Performed at [facility], [address], [place], [place] [address]          Radiology Studies: No results found.      Scheduled Meds: . carvedilol  3.125 mg Oral BID WC  . feeding supplement (ENSURE ENLIVE)  237 mL Oral BID BM  . furosemide  20 mg Oral Daily  . heparin  5,000 Units Subcutaneous Q8H  . levETIRAcetam  250 mg Oral BID  . lisinopril  2.5 mg Oral Daily  . mouth rinse  15 mL Mouth Rinse q12n4p  . sertraline  50 mg Oral Daily   Continuous Infusions: . sodium chloride 10 mL/hr at 06/08/18 1800     LOS: 8 days    Time spent: 35    Delaine Lame, MD [facility]  If 7PM-7AM, please contact night-coverage [email or web address] Password [email or web address] 06/12/2018, 10:11 AM

## 2018-06-13 DIAGNOSIS — R627 Adult failure to thrive: Secondary | ICD-10-CM

## 2018-06-13 LAB — GLUCOSE, CAPILLARY
Glucose-Capillary: 124 mg/dL — ABNORMAL HIGH (ref 70–99)
Glucose-Capillary: 104 mg/dL — ABNORMAL HIGH (ref 70–99)
Glucose-Capillary: 110 mg/dL — ABNORMAL HIGH (ref 70–99)
Glucose-Capillary: 114 mg/dL — ABNORMAL HIGH (ref 70–99)
Glucose-Capillary: 107 mg/dL — ABNORMAL HIGH (ref 70–99)

## 2018-06-13 NOTE — Progress Notes (Signed)
PROGRESS NOTE    [name]  AQT:[id number] DOB: 06-18-1941 DOA: 06/04/2018 PCP: Laurann Montana, MD    Brief Narrative:  77 year old with past medical history relevant for hypertension, chronic systolic and diastolic heart failure with EF of 25 to 30% and grade 2 diastolic dysfunction by echo on 03/30/2018, dementia, mild to moderate peripheral vascular disease who was admitted with unresponsive episode and found to have seizures and hypoglycemia.  Patient was admitted to the ICU after being intubated on 06/04/2018 and extubated on 06/07/2017.   Assessment & Plan:   Active Problems:   Acute hypercapnic respiratory failure (HCC)   Acute encephalopathy   Status epilepticus (HCC)   Acute metabolic encephalopathy   Respiratory failure (HCC)   Seizure (HCC)   Palliative care by specialist   DNR (do not resuscitate)   Weakness generalized   #) Seizures: At this time her EEG showed no evidence of seizure activity and her MRI was negative.  Thought possibly that her seizures were provoked by hypoglycemic episode. -Continue levetiracetam 250 mg twice daily -Neurology has signed off -Pending discharge to skilled nursing facility  #) Chronic systolic heart failure: Last echo was approximately 3 months ago. -Continue home furosemide 20 mg daily -Continue carvedilol 3.125 mg twice daily -Continue lisinopril 2.5 mg daily  #) Hypertension: -Continue beta-blocker and ACE inhibitor per above  #) Dementia:  - Palliative care consult, son is opted for outpatient palliative care after discharge to skilled nursing facility for rehab -Pending discharge to skilled nursing facility  #) Pain/psych: -Continue sertraline 50 mg daily  Fluids: Tolerating p.o. Electrolytes: Monitor and supplement Nutrition: Dysphagia 2 diet  Prophylaxis: Subcu heparin  Disposition: Pending skilled nursing facility with rehab and outpatient hospice/palliative care  DO NOT RESUSCITATE Consultants:    PCCM  Neurology  Palliative care  Procedures:   Intubation 06/04/2018 to 06/07/2018  EEG 06/04/2018: Diffuse slowing, no seizure activities  Antimicrobials:   None   Subjective: Patient does not have any complaints.  She has not had breakfast this morning but reports that she is hungry.  She denies any chest pain, nausea, vomiting, diarrhea, cough, congestion.  Objective: Vitals:   06/12/18 1937 06/12/18 2320 06/13/18 0315 06/13/18 0837  BP: (!) 121/58 (!) 134/57 (!) 131/51 (!) 147/67  Pulse: 90 (!) 101 97 85  Resp: 18 20 18 16   Temp: 98.2 F (36.8 C) 98.1 F (36.7 C) 98.3 F (36.8 C)   TempSrc: Oral Oral Oral   SpO2: 99% 96% 97% 98%  Weight:   53 kg   Height:        Intake/Output Summary (Last 24 hours) at 06/13/2018 1040 Last data filed at 06/13/2018 0600 Gross per 24 hour  Intake 800 ml  Output -  Net 800 ml   Filed Weights   06/11/18 0500 06/12/18 0341 06/13/18 0315  Weight: 52.7 kg 52.4 kg 53 kg    Examination:  General exam: Appears calm and comfortable  Respiratory system: Clear to auscultation. Respiratory effort normal. Cardiovascular system: Regular rate and rhythm, no murmurs Gastrointestinal system: Abdomen is nondistended, soft and nontender. No organomegaly or masses felt. Normal bowel sounds heard. Central nervous system: Alert but not oriented, patient confabulates somewhat.  Less suspicious and paranoid than before Extremities: No lower extremity edema Skin: No rashes over visible skin Psychiatry: Unable to assess due to dementia    Data Reviewed: I have personally reviewed following labs and imaging studies  CBC: Recent Labs  Lab 06/07/18 0244 06/08/18 0250  WBC 13.2*  13.0*  HGB 10.9* 11.9*  HCT 34.7* 37.7  MCV 93.3 93.1  PLT 220 265   Basic Metabolic Panel: Recent Labs  Lab 06/07/18 0244 06/08/18 0250  NA 144 144  K 3.7 3.9  CL 112* 108  CO2 27 25  GLUCOSE 128* 83  BUN 18 18  CREATININE 0.68 0.75  CALCIUM 9.0 9.4   MG 2.2  --    GFR: Estimated Creatinine Clearance: 47.9 mL/min (by C-G formula based on SCr of 0.75 mg/dL). Liver Function Tests: No results for input(s): AST, ALT, ALKPHOS, BILITOT, PROT, ALBUMIN in the last 168 hours. No results for input(s): LIPASE, AMYLASE in the last 168 hours. No results for input(s): AMMONIA in the last 168 hours. Coagulation Profile: No results for input(s): INR, PROTIME in the last 168 hours. Cardiac Enzymes: No results for input(s): CKTOTAL, CKMB, CKMBINDEX, TROPONINI in the last 168 hours. BNP (last 3 results) No results for input(s): PROBNP in the last 8760 hours. HbA1C: No results for input(s): HGBA1C in the last 72 hours. CBG: Recent Labs  Lab 06/12/18 1628 06/12/18 2025 06/12/18 2320 06/13/18 0316 06/13/18 0834  GLUCAP 107* 119* 109* 124* 104*   Lipid Profile: No results for input(s): CHOL, HDL, LDLCALC, TRIG, CHOLHDL, LDLDIRECT in the last 72 hours. Thyroid Function Tests: No results for input(s): TSH, T4TOTAL, FREET4, T3FREE, THYROIDAB in the last 72 hours. Anemia Panel: No results for input(s): VITAMINB12, FOLATE, FERRITIN, TIBC, IRON, RETICCTPCT in the last 72 hours. Sepsis Labs: No results for input(s): PROCALCITON, LATICACIDVEN in the last 168 hours.  Recent Results (from the past 240 hour(s))  Culture, blood (routine x 2)     Status: None   Collection Time: 06/04/18  2:00 AM  Result Value Ref Range Status   Specimen Description BLOOD LEFT FOREARM  Final   Special Requests   Final    BOTTLES DRAWN AEROBIC ONLY Blood Culture adequate volume   Culture   Final    NO GROWTH 5 DAYS Performed at [facility], [address] [place], [place] [address]    Report Status 06/09/2018 FINAL  Final  Urine culture     Status: Abnormal   Collection Time: 06/04/18  2:45 AM  Result Value Ref Range Status   Specimen Description URINE, CATHETERIZED  Final   Special Requests NONE  Final   Culture (A)  Final    <10,000 COLONIES/mL  INSIGNIFICANT GROWTH Performed at [facility], [address] [place], [place] [address]    Report Status 06/05/2018 FINAL  Final  Culture, blood (routine x 2)     Status: None   Collection Time: 06/04/18  3:50 AM  Result Value Ref Range Status   Specimen Description BLOOD RIGHT EJ  Final   Special Requests   Final    BOTTLES DRAWN AEROBIC AND ANAEROBIC Blood Culture results may not be optimal due to an inadequate volume of blood received in culture bottles   Culture   Final    NO GROWTH 5 DAYS Performed at [facility], [address], [place], [place] [address]    Report Status 06/09/2018 FINAL  Final  MRSA PCR Screening     Status: None   Collection Time: 06/04/18  5:07 AM  Result Value Ref Range Status   MRSA by PCR NEGATIVE NEGATIVE Final    Comment:        The GeneXpert MRSA Assay (FDA approved for NASAL specimens only), is one component of a comprehensive MRSA colonization  surveillance program. It is not intended to diagnose MRSA infection nor to guide or monitor treatment for MRSA infections. Performed at [facility], [address], [place], [place] [address]          Radiology Studies: No results found.      Scheduled Meds: . carvedilol  3.125 mg Oral BID WC  . feeding supplement (ENSURE ENLIVE)  237 mL Oral BID BM  . furosemide  20 mg Oral Daily  . heparin  5,000 Units Subcutaneous Q8H  . levETIRAcetam  250 mg Oral BID  . lisinopril  2.5 mg Oral Daily  . mouth rinse  15 mL Mouth Rinse q12n4p  . sertraline  50 mg Oral Daily   Continuous Infusions: . sodium chloride 10 mL/hr at 06/08/18 1800     LOS: 9 days    Time spent: 35    Delaine Lame, MD [facility]  If 7PM-7AM, please contact night-coverage [email or web address] Password [email or web address] 06/13/2018, 10:40 AM

## 2018-06-13 NOTE — Discharge Instructions (Signed)
Epilepsy °Epilepsy is a condition in which a person has repeated seizures over time. A seizure is a sudden burst of abnormal electrical and chemical activity in the brain. Seizures can cause a change in attention, behavior, or the ability to remain awake and alert (altered mental status). °Epilepsy increases a person's risk of falls, accidents, and injury. It can also lead to complications, including: °· Depression. °· Poor memory. °· Sudden unexplained death in epilepsy (SUDEP). This complication is rare, and its cause is not known. ° °Most people with epilepsy lead normal lives. °What are the causes? °This condition may be caused by: °· A head injury. °· An injury that happens at birth. °· A high fever during childhood. °· A stroke. °· Bleeding that goes into or around the brain. °· Certain medicines and drugs. °· Having too little oxygen for a long period of time. °· Abnormal brain development. °· Certain infections, such as meningitis and encephalitis. °· Brain tumors. °· Conditions that are passed along from parent to child (are hereditary). ° °What are the signs or symptoms? °Symptoms of a seizure vary greatly from person to person. They include: °· Convulsions. °· Stiffening of the body. °· Involuntary movements of the arms or legs. °· Loss of consciousness. °· Breathing problems. °· Falling suddenly. °· Confusion. °· Head nodding. °· Eye blinking or fluttering. °· Lip smacking. °· Drooling. °· Rapid eye movements. °· Grunting. °· Loss of bladder control and bowel control. °· Staring. °· Unresponsiveness. ° °Some people have symptoms right before a seizure happens (aura) and right after a seizure happens. Symptoms of an aura include: °· Fear or anxiety. °· Nausea. °· Feeling like the room is spinning (vertigo). °· A feeling of having seen or heard something before (deja vu). °· Odd tastes or smells. °· Changes in vision, such as seeing flashing lights or spots. ° °Symptoms that follow a seizure  include: °· Confusion. °· Sleepiness. °· Headache. ° °How is this diagnosed? °This condition is diagnosed based on: °· Your symptoms. °· Your medical history. °· A physical exam. °· A neurological exam. A neurological exam is similar to a physical exam. It involves checking your strength, reflexes, coordination, and sensations. °· Tests, such as: °? An electroencephalogram (EEG). This is a painless test that creates a diagram of your brain waves. °? An MRI of the brain. °? A CT scan of the brain. °? A lumbar puncture, also called a spinal tap. °? Blood tests to check for signs of infection or abnormal blood chemistry. ° °How is this treated? °There is no cure for this condition, but treatment can help control seizures. Treatment may involve: °· Taking medicines to control seizures. These include medicines to prevent seizures and medicines to stop seizures as they occur. °· Having a device called a vagus nerve stimulator implanted in the chest. The device sends electrical impulses to the vagus nerve and to the brain to prevent seizures. This treatment may be recommended if medicines do not help. °· Brain surgery. There are several kinds of surgeries that may be done to stop seizures from happening or to reduce how often seizures happen. °· Having regular blood tests. You may need to have blood tests regularly to check that you are getting the right amount of medicine. ° °Once this condition has been diagnosed, it is important to begin treatment as soon as possible. For some people, epilepsy eventually goes away. °Follow these instructions at home: °Medicines ° °· Take over-the-counter and prescription medicines only as   told by your health care provider. °· Avoid any substances that may prevent your medicine from working properly, such as alcohol. °Activity °· Get enough rest. Lack of sleep can make seizures more likely to occur. °· Follow instructions from your health care provider about driving, swimming, and doing  any other activities that would be dangerous if you had a seizure. °Educating others °Teach friends and family what to do if you have a seizure. They should: °· Lay you on the ground to prevent a fall. °· Cushion your head and body. °· Loosen any tight clothing around your neck. °· Turn you on your side. If vomiting occurs, this helps keep your airway clear. °· Stay with you until you recover. °· Not hold you down. Holding you down will not stop the seizure. °· Not put anything in your mouth. °· Know whether or not you need emergency care. ° °General instructions °· Avoid anything that has ever triggered a seizure for you. °· Keep a seizure diary. Record what you remember about each seizure, especially anything that might have triggered the seizure. °· Keep all follow-up visits as told by your health care provider. This is important. °Contact a health care provider if: °· Your seizure pattern changes. °· You have symptoms of infection or another illness. This might increase your risk of having a seizure. °Get help right away if: °· You have a seizure that does not stop after 5 minutes. °· You have several seizures in a row without a complete recovery in between seizures. °· You have a seizure that makes it harder to breathe. °· You have a seizure that is different from previous seizures. °· You have a seizure that leaves you unable to speak or use a part of your body. °· You did not wake up immediately after a seizure. °This information is not intended to replace advice given to you by your health care provider. Make sure you discuss any questions you have with your health care provider. °Document Released: 09/22/2005 Document Revised: 04/19/2016 Document Reviewed: 04/01/2016 °Elsevier Interactive Patient Education © 2018 [employer] ° °

## 2018-06-14 LAB — GLUCOSE, CAPILLARY
Glucose-Capillary: 88 mg/dL (ref 70–99)
Glucose-Capillary: 137 mg/dL — ABNORMAL HIGH (ref 70–99)
Glucose-Capillary: 86 mg/dL (ref 70–99)

## 2018-06-14 MED ORDER — HALOPERIDOL LACTATE 5 MG/ML IJ SOLN
2.0000 mg | Freq: Four times a day (QID) | INTRAMUSCULAR | Status: DC | PRN
  Administered 2018-06-14: 2 mg via INTRAMUSCULAR
  Filled 2018-06-14: qty 1

## 2018-06-14 MED ORDER — HALOPERIDOL 2 MG PO TABS
2.0000 mg | ORAL_TABLET | Freq: Four times a day (QID) | ORAL | Status: DC | PRN
  Filled 2018-06-14: qty 1

## 2018-06-14 NOTE — Plan of Care (Signed)
  Problem: Activity: Goal: Risk for activity intolerance will decrease Outcome: Not Progressing   

## 2018-06-14 NOTE — Progress Notes (Signed)
Notified MD of agitation, patient is combative and uncooperative, orders received.

## 2018-06-14 NOTE — Progress Notes (Signed)
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  Patient was admitted to the ICU after being intubated on 06/04/2018 and extubated on 06/07/2017.   Assessment & Plan:   Active Problems:   Acute hypercapnic respiratory failure (HCC)   Acute encephalopathy   Status epilepticus (HCC)   Acute metabolic encephalopathy   Respiratory failure (HCC)   Seizure (HCC)   Palliative care by specialist   DNR (do not resuscitate)   Weakness generalized   #) Seizures: Resolved, thought to be possibly provoked secondary to metabolic causes including hypoglycemia -Continue levetiracetam 250 mg twice daily -Neurology has signed off -Pending discharge to skilled nursing facility  #) Chronic systolic heart failure: Last echo was approximately 3 months ago. -Continue home furosemide 20 mg daily -Continue carvedilol 3.125 mg twice daily -Continue lisinopril 2.5 mg daily  #) Hypertension: -Continue beta-blocker and ACE inhibitor per above  #) Dementia:  - Palliative care consult, son is opted for outpatient palliative care after discharge to skilled nursing facility for rehab -Pending discharge to skilled nursing facility  #) Pain/psych: -Continue sertraline 50 mg daily  Fluids: Tolerating p.o. Electrolytes: Monitor and supplement Nutrition: Dysphagia 2 diet  Prophylaxis: Subcu heparin  Disposition: Pending skilled nursing facility with rehab and outpatient hospice/palliative care  DO NOT RESUSCITATE Consultants:   PCCM  Neurology  Palliative care  Procedures:   Intubation  06/04/2018 to 06/07/2018  EEG 06/04/2018: Diffuse slowing, no seizure activities  Antimicrobials:   None   Subjective: Patient does not have any complaints.  She is awake in bed and feeding herself breakfast.  She reportedly had a good night.  She denies any nausea, vomiting, diarrhea, cough, congestion.  Objective: Vitals:   06/13/18 1932 06/13/18 2311 06/14/18 0334 06/14/18 0800  BP: (!) 150/64 (!) 145/62 (!) 155/62 (!) 151/78  Pulse: 98 92 80 93  Resp: 18 18 18 20   Temp: 99.2 F (37.3 C) 99.1 F (37.3 C) 97.7 F (36.5 C) (!) 96.1 F (35.6 C)  TempSrc: Oral Oral Oral Oral  SpO2: 96% 96% 97% 97%  Weight:   50.4 kg   Height:        Intake/Output Summary (Last 24 hours) at 06/14/2018 0918 Last data filed at 06/14/2018 0900 Gross per 24 hour  Intake 720 ml  Output -  Net 720 ml   Filed Weights   06/12/18 0341 06/13/18 0315 06/14/18 0334  Weight: 52.4 kg 53 kg 50.4 kg    Examination:  General exam: Appears calm and comfortable  Respiratory system: Clear to auscultation. Respiratory effort normal. Cardiovascular system: Regular rate and rhythm, no murmurs Gastrointestinal system: Abdomen is nondistended, soft and nontender. No organomegaly or masses felt. Normal bowel sounds heard. Central nervous system: Alert but not oriented, patient confabulates somewhat.  Appears to be in good spirits, moving all extremities Extremities: No lower extremity edema Skin: No rashes over visible skin Psychiatry: Unable to assess due to dementia    Data Reviewed: I have personally reviewed following labs and imaging studies  CBC: Recent Labs  Lab 06/08/18 0250  WBC 13.0*  HGB 11.9*  HCT 37.7  MCV 93.1  PLT 265   Basic Metabolic Panel: Recent Labs  Lab 06/08/18 0250  NA 144  K 3.9  CL 108  CO2 25  GLUCOSE 83  BUN 18  CREATININE 0.75  CALCIUM 9.4   GFR: Estimated Creatinine Clearance: 46.1 mL/min (by C-G formula based on SCr of 0.75 mg/dL). Liver Function Tests: No  results for input(s): AST, ALT, ALKPHOS, BILITOT, PROT, ALBUMIN in the last 168 hours. No results for input(s): LIPASE, AMYLASE in the last 168 hours. No results for input(s): AMMONIA in the last 168 hours. Coagulation Profile: No results for input(s): INR, PROTIME in the last 168 hours. Cardiac Enzymes: No results for input(s): CKTOTAL, CKMB, CKMBINDEX, TROPONINI in the last 168 hours. BNP (last 3 results) No results for input(s): PROBNP in the last 8760 hours. HbA1C: No results for input(s): HGBA1C in the last 72 hours. CBG: Recent Labs  Lab 06/13/18 0834 06/13/18 1227 06/13/18 1635 06/13/18 1932 06/14/18 0619  GLUCAP 104* 110* 114* 107* 88   Lipid Profile: No results for input(s): CHOL, HDL, LDLCALC, TRIG, CHOLHDL, LDLDIRECT in the last 72 hours. Thyroid Function Tests: No results for input(s): TSH, T4TOTAL, FREET4, T3FREE, THYROIDAB in the last 72 hours. Anemia Panel: No results for input(s): VITAMINB12, FOLATE, FERRITIN, TIBC, IRON, RETICCTPCT in the last 72 hours. Sepsis Labs: No results for input(s): PROCALCITON, LATICACIDVEN in the last 168 hours.  No results found for this or any previous visit (from the past 240 hour(s)).       Radiology Studies: No results found.      Scheduled Meds: . carvedilol  3.125 mg Oral BID WC  . feeding supplement (ENSURE ENLIVE)  237 mL Oral BID BM  . furosemide  20 mg Oral Daily  . heparin  5,000 Units Subcutaneous Q8H  . levETIRAcetam  250 mg Oral BID  . lisinopril  2.5 mg Oral Daily  . mouth rinse  15 mL Mouth Rinse q12n4p  . sertraline  50 mg Oral Daily   Continuous Infusions: . sodium chloride 10 mL/hr at 06/08/18 1800     LOS: 10 days    Time spent: 35    Delaine Lame, MD [facility]  If 7PM-7AM, please contact night-coverage [email or web address] Password [email or web address] 06/14/2018, 9:18 AM

## 2018-06-14 NOTE — Progress Notes (Signed)
Patient ID: Tammy [name], female   DOB: 1940-12-07, 77 y.o.   MRN: [id number]  This NP visited patient at the bedside as a follow up to  Initial GOCs meeting.  Patient is pleasantly confused, appears comfortable, oritened only to self.  Son at bedside, continued conversation regarding current medical situation; natural trajectory of dementia and  long term care needs.  Plan is SNF for rehabilitation and likely need for long term placement.  MOST form introduced.  Recommend Palliative services at SNF  Discussed with son the importance of continued conversation with medical providers regarding overall plan of care and treatment options,  ensuring decisions are within the context of the patients values and GOCs.  Questions and concerns addressed   Discussed with Dr Clearnce Sorrel  Total time spent on the unit was 25 minutes  Greater than 50% of the time was spent in counseling and coordination of care  Lorinda Creed NP  Palliative Medicine Team Team Phone # [phone] Pager [phone]

## 2018-06-15 LAB — GLUCOSE, CAPILLARY
Glucose-Capillary: 93 mg/dL (ref 70–99)
Glucose-Capillary: 113 mg/dL — ABNORMAL HIGH (ref 70–99)
Glucose-Capillary: 65 mg/dL — ABNORMAL LOW (ref 70–99)
Glucose-Capillary: 97 mg/dL (ref 70–99)
Glucose-Capillary: 110 mg/dL — ABNORMAL HIGH (ref 70–99)

## 2018-06-15 NOTE — Progress Notes (Signed)
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  Patient was admitted to the ICU after being intubated on 06/04/2018 and extubated on 06/07/2017.   Assessment & Plan:   Active Problems:   Acute hypercapnic respiratory failure (HCC)   Acute encephalopathy   Status epilepticus (HCC)   Acute metabolic encephalopathy   Respiratory failure (HCC)   Seizure (HCC)   Palliative care by specialist   DNR (do not resuscitate)   Weakness generalized   #) Seizures: Resolved, thought to be possibly provoked secondary to metabolic causes including hypoglycemia -Continue levetiracetam 250 mg twice daily -Neurology has signed off -Pending discharge to skilled nursing facility  #) Chronic systolic heart failure: Last echo was approximately 3 months ago. -Continue home furosemide 20 mg daily -Continue carvedilol 3.125 mg twice daily -Continue lisinopril 2.5 mg daily  #) Hypertension: -Continue beta-blocker and ACE inhibitor per above  #) Dementia:  - Palliative care consult, son is opted for outpatient palliative care after discharge to skilled nursing facility for rehab -Pending discharge to skilled nursing facility  #) Pain/psych: -Continue sertraline 50 mg daily  Fluids: Tolerating p.o. Electrolytes: Monitor and supplement Nutrition: Dysphagia 2 diet  Prophylaxis: Subcu heparin  Disposition: Pending skilled nursing facility with rehab and outpatient hospice/palliative care  DO NOT RESUSCITATE Consultants:   PCCM  Neurology  Palliative care  Procedures:   Intubation  06/04/2018 to 06/07/2018  EEG 06/04/2018: Diffuse slowing, no seizure activities  Antimicrobials:   None   Subjective: Patient does not have any complaints.  She did not eat much breakfast as she was not particularly hungry.  She denies any cough, congestion, rhinorrhea, nausea, vomiting, diarrhea.  Objective: Vitals:   06/14/18 2311 06/15/18 0212 06/15/18 0436 06/15/18 0833  BP: (!) 142/36  (!) 152/52 (!) 126/52  Pulse: 95  92 79  Resp: 18  20 16   Temp: 98.6 F (37 C)  98.1 F (36.7 C) 98 F (36.7 C)  TempSrc: Oral  Oral Oral  SpO2: 97%  99% 99%  Weight:  50.3 kg    Height:        Intake/Output Summary (Last 24 hours) at 06/15/2018 1050 Last data filed at 06/15/2018 0708 Gross per 24 hour  Intake 777 ml  Output -  Net 777 ml   Filed Weights   06/13/18 0315 06/14/18 0334 06/15/18 0212  Weight: 53 kg 50.4 kg 50.3 kg    Examination:  General exam: Appears calm and comfortable  Respiratory system: Clear to auscultation. Respiratory effort normal. Cardiovascular system: Regular rate and rhythm, no murmurs Gastrointestinal system: Abdomen is nondistended, soft and nontender. No organomegaly or masses felt. Normal bowel sounds heard. Central nervous system: Alert but not oriented, patient confabulates somewhat.  Appears to be in good spirits, moving all extremities Extremities: No lower extremity edema Skin: No rashes over visible skin Psychiatry: Unable to assess due to dementia    Data Reviewed: I have personally reviewed following labs and imaging studies  CBC: No results for input(s): WBC, NEUTROABS, HGB, HCT, MCV, PLT in the last 168 hours. Basic Metabolic Panel: No results for input(s): NA, K, CL, CO2, GLUCOSE,  BUN, CREATININE, CALCIUM, MG, PHOS in the last 168 hours. GFR: Estimated Creatinine Clearance: 46 mL/min (by C-G formula based on SCr of 0.75 mg/dL). Liver Function Tests: No results for input(s): AST, ALT, ALKPHOS, BILITOT, PROT, ALBUMIN in the last 168  hours. No results for input(s): LIPASE, AMYLASE in the last 168 hours. No results for input(s): AMMONIA in the last 168 hours. Coagulation Profile: No results for input(s): INR, PROTIME in the last 168 hours. Cardiac Enzymes: No results for input(s): CKTOTAL, CKMB, CKMBINDEX, TROPONINI in the last 168 hours. BNP (last 3 results) No results for input(s): PROBNP in the last 8760 hours. HbA1C: No results for input(s): HGBA1C in the last 72 hours. CBG: Recent Labs  Lab 06/13/18 1932 06/14/18 0619 06/14/18 1948 06/14/18 2312 06/15/18 0415  GLUCAP 107* 88 137* 86 93   Lipid Profile: No results for input(s): CHOL, HDL, LDLCALC, TRIG, CHOLHDL, LDLDIRECT in the last 72 hours. Thyroid Function Tests: No results for input(s): TSH, T4TOTAL, FREET4, T3FREE, THYROIDAB in the last 72 hours. Anemia Panel: No results for input(s): VITAMINB12, FOLATE, FERRITIN, TIBC, IRON, RETICCTPCT in the last 72 hours. Sepsis Labs: No results for input(s): PROCALCITON, LATICACIDVEN in the last 168 hours.  No results found for this or any previous visit (from the past 240 hour(s)).       Radiology Studies: No results found.      Scheduled Meds: . carvedilol  3.125 mg Oral BID WC  . feeding supplement (ENSURE ENLIVE)  237 mL Oral BID BM  . furosemide  20 mg Oral Daily  . heparin  5,000 Units Subcutaneous Q8H  . levETIRAcetam  250 mg Oral BID  . lisinopril  2.5 mg Oral Daily  . mouth rinse  15 mL Mouth Rinse q12n4p  . sertraline  50 mg Oral Daily   Continuous Infusions: . sodium chloride 10 mL/hr at 06/08/18 1800     LOS: 11 days    Time spent: 35    Delaine Lame, MD [facility]  If 7PM-7AM, please contact night-coverage [email or web address] Password [email or web address] 06/15/2018, 10:50 AM

## 2018-06-15 NOTE — Care Management Note (Signed)
Case Management Note  Patient Details  Name: Tammy [name] MRN: [id number] Date of Birth: October 31, 1940  Subjective/Objective:                    Action/Plan: Pt is awaiting insurance authorization through [facility] medicare for SNF placement. CM following.   Expected Discharge Date:                  Expected Discharge Plan:  Skilled Nursing Facility  In-House Referral:  Clinical Social Work  Discharge planning Services  CM Consult  Post Acute Care Choice:    Choice offered to:     DME Arranged:    DME Agency:     HH Arranged:    HH Agency:     Status of Service:  In process, will continue to follow  If discussed at Long Length of Stay Meetings, dates discussed:    Additional Comments:  Kermit Balo, RN 06/15/2018, 3:26 PM

## 2018-06-15 NOTE — Progress Notes (Signed)
Physical Therapy Treatment Patient Details Name: Tammy [name] MRN: [id number] DOB: 1940-10-26 Today's Date: 06/15/2018    History of Present Illness 77 year old female with PMH of Systolic Heart Failure (EF 25-30, G2DD), HTN, Tobacco Use, PVD, Stage 3 CKD, Dementia, Depression, COPD, CVA  admitted to [facility] for seizures.    PT Comments    Patient tolerated treatment well. In room ambulation, functional tasks, and cognitive interventions performed during session. Patient required max multi modal cues for encouragement but was very receptive today. SNF remains appropriate. Will continue to see and progress as tolerated.   Follow Up Recommendations  SNF;Supervision/Assistance - 24 hour     Equipment Recommendations  (TBD)    Recommendations for Other Services       Precautions / Restrictions Precautions Precautions: Fall Restrictions Weight Bearing Restrictions: No    Mobility  Bed Mobility               General bed mobility comments: Pt was seated EOB upon arrival   Transfers Overall transfer level: Needs assistance Equipment used: 1 person hand held assist Transfers: Sit to/from Stand Sit to Stand: Mod assist         General transfer comment: sit<>stand X4 from EOB, VC required, mod A for stabilty and safety    Ambulation/Gait Ambulation/Gait assistance: Mod assist Gait Distance (Feet): 10 Feet Assistive device: 1 person hand held assist Gait Pattern/deviations: Step-to pattern;Decreased stride length;Trunk flexed;Narrow base of support Gait velocity: decreased Gait velocity interpretation: <1.31 ft/sec, indicative of household ambulator General Gait Details: Pt performed AMB to sink to wash hands and  AMB back to EOB. Moderate assist for stability and guidance   Stairs             Wheelchair Mobility    Modified Rankin (Stroke Patients Only) Modified Rankin (Stroke Patients Only) Pre-Morbid Rankin Score: Moderately severe disability Modified  Rankin: Moderately severe disability     Balance Overall balance assessment: Needs assistance Sitting-balance support: No upper extremity supported;Feet supported Sitting balance-Leahy Scale: Fair     Standing balance support: Bilateral upper extremity supported Standing balance-Leahy Scale: Poor Standing balance comment: Pt used sink to support UE when standing at sink to wash hands                             Cognition Arousal/Alertness: Awake/alert Behavior During Therapy: Anxious Overall Cognitive Status: History of cognitive impairments - at baseline                                 General Comments: Pt repeated same stories during therapy , patient  attention is easily diverted      Exercises      General Comments General comments (skin integrity, edema, etc.): hygiene and pericare performed patient + BM       Pertinent Vitals/Pain Pain Assessment: No/denies pain    Home Living                      Prior Function            PT Goals (current goals can now be found in the care plan section) Acute Rehab PT Goals Patient Stated Goal: To return to prior level of function  PT Goal Formulation: With patient Time For Goal Achievement: 06/29/18 Potential to Achieve Goals: Fair Progress towards PT goals: Progressing toward goals  Frequency    Min 2X/week      PT Plan Current plan remains appropriate    Co-evaluation              AM-PAC PT "6 Clicks" Daily Activity  Outcome Measure  Difficulty turning over in bed (including adjusting bedclothes, sheets and blankets)?: A Lot Difficulty moving from lying on back to sitting on the side of the bed? : Unable Difficulty sitting down on and standing up from a chair with arms (e.g., wheelchair, bedside commode, etc,.)?: Unable Help needed moving to and from a bed to chair (including a wheelchair)?: A Lot Help needed walking in hospital room?: A Lot Help needed climbing  3-5 steps with a railing? : A Lot 6 Click Score: 10    End of Session   Activity Tolerance: Patient tolerated treatment well Patient left: in bed;with call bell/phone within reach;with bed alarm set(sitting EOB) Nurse Communication: Mobility status PT Visit Diagnosis: Unsteadiness on feet (R26.81);Other abnormalities of gait and mobility (R26.89);Muscle weakness (generalized) (M62.81);History of falling (Z91.81);Other symptoms and signs involving the nervous system (R29.898)     Time: 1429-1450 PT Time Calculation (min) (ACUTE ONLY): 21 min  Charges:  $Therapeutic Activity: 8-22 mins                     Charlotte Crumb, PT DPT  Board Certified Neurologic Specialist Acute Rehabilitation Services Pager [phone] Office [phone]   Tammy [name] 06/15/2018, 3:57 PM

## 2018-06-15 NOTE — Progress Notes (Signed)
Nutrition Brief Note  Chart reviewed. Pt now transitioning to comfort care.  No further nutrition interventions warranted at this time.  Please re-consult as needed.   Ontario Pettengill RD, LDN Clinical Nutrition Pager # - [phone]    

## 2018-06-16 DIAGNOSIS — R627 Adult failure to thrive: Secondary | ICD-10-CM

## 2018-06-16 LAB — GLUCOSE, CAPILLARY
Glucose-Capillary: 105 mg/dL — ABNORMAL HIGH (ref 70–99)
Glucose-Capillary: 100 mg/dL — ABNORMAL HIGH (ref 70–99)
Glucose-Capillary: 93 mg/dL (ref 70–99)
Glucose-Capillary: 131 mg/dL — ABNORMAL HIGH (ref 70–99)

## 2018-06-16 MED ORDER — ENSURE ENLIVE PO LIQD: 237.0000 mL | Freq: Two times a day (BID) | ORAL | 12 refills | Status: DC

## 2018-06-16 MED ORDER — ACETAMINOPHEN 325 MG PO TABS: 325.0000 mg | ORAL_TABLET | Freq: Four times a day (QID) | ORAL | Status: DC | PRN

## 2018-06-16 MED ORDER — LEVETIRACETAM 250 MG PO TABS: 250.0000 mg | ORAL_TABLET | Freq: Two times a day (BID) | ORAL | 1 refills | Status: DC

## 2018-06-16 NOTE — Discharge Summary (Signed)
Physician Discharge Summary   Patient ID: Tammy [name] MRN: [id number] DOB/AGE: Dec 11, 1940 77 y.o.  Admit date: 06/04/2018 Discharge date: 06/16/2018  Primary Care Physician:  Laurann Montana, MD   Recommendations for Outpatient Follow-up:  1. Follow up with PCP in 1-2 weeks 2. Please obtain BMP/CBC in one week 3. Palliative care to follow at the facility  Home Health: PT recommending skilled nursing facility Equipment/Devices:   Discharge Condition: stable  CODE STATUS:  DNR   Diet recommendation: Dysphagia 2 diet with thin liquid, whole meds with pure   Discharge Diagnoses:   Seizures likely due to hypoglycemia . Acute metabolic encephalopathy Chronic systolic CHF Hypertension Dementia Acute respiratory failure    Consults: Patient was admitted by critical care service on 06/04/2018    Allergies:  No Known Allergies   DISCHARGE MEDICATIONS: Allergies as of 06/16/2018   No Known Allergies     Medication List    STOP taking these medications   traZODone 50 MG tablet Commonly known as:  DESYREL     TAKE these medications   acetaminophen 325 MG tablet Commonly known as:  TYLENOL Take 1 tablet (325 mg total) by mouth every 6 (six) hours as needed for fever.   carvedilol 3.125 MG tablet Commonly known as:  COREG Take 1 tablet (3.125 mg total) by mouth 2 (two) times daily with a meal.   diphenhydramine-acetaminophen 25-500 MG Tabs tablet Commonly known as:  TYLENOL PM Take 1 tablet by mouth at bedtime as needed (sleep).   feeding supplement (ENSURE ENLIVE) Liqd Take 237 mLs by mouth 3 (three) times daily between meals. What changed:  Another medication with the same name was added. Make sure you understand how and when to take each.   feeding supplement (ENSURE ENLIVE) Liqd Take 237 mLs by mouth 2 (two) times daily between meals. What changed:  You were already taking a medication with the same name, and this prescription was added. Make sure you  understand how and when to take each.   furosemide 20 MG tablet Commonly known as:  LASIX Take 20 mg by mouth daily.   levETIRAcetam 250 MG tablet Commonly known as:  KEPPRA Take 1 tablet (250 mg total) by mouth 2 (two) times daily.   lisinopril 2.5 MG tablet Commonly known as:  PRINIVIL,ZESTRIL Take 2.5 mg by mouth daily.   sertraline 50 MG tablet Commonly known as:  ZOLOFT Take 50 mg by mouth daily.        Brief H and P: For complete details please refer to admission H and P, but in brief 77 year old with past medical history relevant for hypertension, chronic systolic and diastolic heart failure with EF of 25 to 30% and grade 2 diastolic dysfunction by echo on 03/30/2018, dementia, mild to moderate peripheral vascular disease who was admitted with unresponsive episode and found to have seizures and hypoglycemia.  Patient was admitted to the ICU after being intubated on 06/04/2018 and extubated on 06/07/2017.  Hospital Course:    Seizures likely provoked due to hypoglycemia -Patient was found unresponsive due to seizures and hypoglycemia -She was intubated in the ED for airway protection -Neurology was consulted, patient was placed on Keppra, continue 250 mg twice a day -Ambulatory referral to neurology sent -No driving, no underwater sports, hiking.  Continue nutritional supplements  Acute respiratory failure with hypoxia:  Patient was admitted to the ICU after being intubated on 06/04/2018 for airway  protection and extubated on 06/07/2017. - currently O2 sats 96% on RA  Chronic systolic CHF -2D echo 25/19 showed EF of 25 to 30% with grade 2 diastolic dysfunction -Currently euvolemic, continue Lasix, ACE inhibitor, beta-blocker  Hypertension -BP currently soft, currently on Lasix, ACE inhibitor, beta-blocker follow closely  Dementia Palliative care was consulted and patient son opted for outpatient palliative care after discharge to skilled nursing facility    Day  of Discharge S: Alert and awake, eating breakfast at the time of my encounter, sitting up in the chair, no acute issues.  BP (!) 109/45 (BP Location: Left Arm)   Pulse 80   Temp 97.7 F (36.5 C) (Oral)   Resp 15   Ht 5\' 3"  (1.6 m)   Wt 50.3 kg   SpO2 96%   BMI 19.66 kg/m   Physical Exam: General: Alert and awake oriented, not in any acute distress.  Appears at her baseline HEENT: anicteric sclera, pupils reactive to light and accommodation CVS: S1-S2 clear no murmur rubs or gallops Chest: clear to auscultation bilaterally, no wheezing rales or rhonchi Abdomen: soft nontender, nondistended, normal bowel sounds Extremities: no cyanosis, clubbing or edema noted bilaterally Neuro: no new deficits   The results of significant diagnostics from this hospitalization (including imaging, microbiology, ancillary and laboratory) are listed below for reference.      Procedures/Studies:  Ct Head Wo Contrast  Result Date: 06/04/2018 CLINICAL DATA:  77 year old female with altered mental status. EXAM: CT HEAD WITHOUT CONTRAST TECHNIQUE: Contiguous axial images were obtained from the base of the skull through the vertex without intravenous contrast. COMPARISON:  Head CT dated 06/11/2016 FINDINGS: Brain: The ventricles and sulci appropriate size for patient's age. Mild periventricular and deep white matter chronic microvascular ischemic changes noted. There is no acute intracranial hemorrhage. No mass effect midline shift. No extra-axial fluid collection. Vascular: No hyperdense vessel or unexpected calcification. Skull: Normal. Negative for fracture or focal lesion. Sinuses/Orbits: Mild mucoperiosteal thickening of paranasal sinuses. No air-fluid levels. The mastoid air cells are clear. There is opacification of the nasopharynx. Other: Partially visualized endotracheal and enteric tubes. IMPRESSION: 1. No acute intracranial pathology. 2. Mild chronic microvascular ischemic changes. Electronically  Signed   By: Elgie Collard M.D.   On: 06/04/2018 02:57   Mr Brain Wo Contrast  Result Date: 06/04/2018 CLINICAL DATA:  Seizure.  Unresponsive.  Ventilator support. EXAM: MRI HEAD WITHOUT CONTRAST TECHNIQUE: Multiplanar, multiecho pulse sequences of the brain and surrounding structures were obtained without intravenous contrast. COMPARISON:  CT same day.  MRI 02/28/2015 FINDINGS: Brain: Diffusion imaging does not show any acute or subacute infarction or other cause of restricted diffusion. There is generalized brain atrophy. There are moderate chronic small-vessel ischemic changes of the cerebral hemispheric deep and subcortical white matter. No cortical or large vessel territory infarction. No mass lesion, hemorrhage, hydrocephalus or extra-axial collection. Temporal lobe atrophy but without evidence of focal mesial temporal lesion. Vascular: Major vessels at the base of the brain show flow. Skull and upper cervical spine: Negative Sinuses/Orbits: Clear/normal Other: None IMPRESSION: No acute or reversible finding.  No cause of seizure identified. Generalized brain atrophy. Moderate chronic small-vessel ischemic changes of the cerebral hemispheric white matter. Electronically Signed   By: Paulina Fusi M.D.   On: 06/04/2018 11:39   Dg Chest Port 1 View  Result Date: 06/06/2018 CLINICAL DATA:  Respiratory failure in a patient with a history of COPD. Status post seizure x2 06/04/2018. EXAM: PORTABLE CHEST 1 VIEW COMPARISON:  Single-view of the chest 06/04/2018 and 06/05/2018. FINDINGS: Endotracheal tube and NG tube remain  in place. The chest is markedly hyperexpanded but the lungs are clear. Heart size is upper normal. No pneumothorax or pleural effusion. Aortic atherosclerosis noted. IMPRESSION: ETT and NG tube in good position. Emphysema without acute disease. Electronically Signed   By: Drusilla Kanner M.D.   On: 06/06/2018 08:45   Dg Chest Port 1 View  Result Date: 06/05/2018 CLINICAL DATA:   Respiratory failure EXAM: PORTABLE CHEST 1 VIEW COMPARISON:  Yesterday FINDINGS: Endotracheal tube tip at the clavicular heads. An orogastric tube reaches the stomach. Distorted chest from scoliosis. Borderline heart size is stable. The lungs are clear. No effusion or pneumothorax. IMPRESSION: 1. Stable hardware positioning. 2. No evidence of acute cardiopulmonary disease. Electronically Signed   By: Marnee Spring M.D.   On: 06/05/2018 08:26   Dg Chest Portable 1 View  Result Date: 06/04/2018 CLINICAL DATA:  77 y/o  F; post intubation. EXAM: PORTABLE CHEST 1 VIEW COMPARISON:  03/29/2018 chest radiograph FINDINGS: Stable cardiomegaly given projection and technique. Aortic atherosclerosis with calcification. Endotracheal tube tip projects 4.9 cm above the carina. Enteric tube tip extends below the field of view into the abdomen. Hyperinflated lungs. No consolidation, effusion, or pneumothorax. No acute osseous abnormality is evident. IMPRESSION: 1. Endotracheal tube tip projects 4.9 cm above the carina. Enteric tube tip extends below the field of view into the abdomen. 2. Stable cardiomegaly and aortic atherosclerosis. 3. Hyperinflated lungs.  No focal consolidation. Electronically Signed   By: Mitzi Hansen M.D.   On: 06/04/2018 02:27      LAB RESULTS: Basic Metabolic Panel: No results for input(s): NA, K, CL, CO2, GLUCOSE, BUN, CREATININE, CALCIUM, MG, PHOS in the last 168 hours. Liver Function Tests: No results for input(s): AST, ALT, ALKPHOS, BILITOT, PROT, ALBUMIN in the last 168 hours. No results for input(s): LIPASE, AMYLASE in the last 168 hours. No results for input(s): AMMONIA in the last 168 hours. CBC: No results for input(s): WBC, NEUTROABS, HGB, HCT, MCV, PLT in the last 168 hours. Cardiac Enzymes: No results for input(s): CKTOTAL, CKMB, CKMBINDEX, TROPONINI in the last 168 hours. BNP: Invalid input(s): POCBNP CBG: Recent Labs  Lab 06/16/18 0747 06/16/18 1138   GLUCAP 93 131*      Disposition and Follow-up: Discharge Instructions    Ambulatory referral to Neurology   Complete by:  As directed    An appointment is requested in approximately: 4 Week(s): for seizure   Diet - low sodium heart healthy   Complete by:  As directed    Increase activity slowly   Complete by:  As directed        DISPOSITION: Skilled nursing facility   DISCHARGE FOLLOW-UP  Contact information for follow-up providers    Laurann Montana, MD. Schedule an appointment as soon as possible for a visit in 2 week(s).   Specialty:  Family Medicine Contact information: [address] [place] [address] [phone]        [facility]. Schedule an appointment as soon as possible for a visit in 4 week(s).   Why:  seizure follow-up Contact information: [address]     [address] [place] [address] [phone]           Contact information for after-discharge care    Destination    [facility] .   Service:  Skilled Nursing Contact information: [address] [place] [address] [phone]  Time coordinating discharge:  35 mins   Signed:   Thad Ranger M.D. [facility] 06/16/2018, 1:52 PM Pager: [phone]

## 2018-06-16 NOTE — Care Management Important Message (Signed)
Important Message  Patient Details  Name: Tammy [name] MRN: [id number] Date of Birth: [birth year]   Medicare Important Message Given:  Yes    Anisten Tomassi P Tywanna Seifer 06/16/2018, 2:30 PM

## 2018-06-16 NOTE — Progress Notes (Signed)
Attempted report x 1 will attempt a second time

## 2018-06-16 NOTE — Clinical Social Work Placement (Signed)
Nurse to call report to [phone], Room 105 [facility]     CLINICAL SOCIAL WORK PLACEMENT  NOTE  Date:  06/16/2018  Patient Details  Name: Tammy [name] MRN: [id number] Date of Birth: 09-22-1941  Clinical Social Work is seeking post-discharge placement for this patient at the Skilled  Nursing Facility level of care (*CSW will initial, date and re-position this form in  chart as items are completed):  Yes   Patient/family provided with [facility] Clinical Social Work Department's list of facilities offering this level of care within the geographic area requested by the patient (or if unable, by the patient's family).  Yes   Patient/family informed of their freedom to choose among providers that offer the needed level of care, that participate in Medicare, Medicaid or managed care program needed by the patient, have an available bed and are willing to accept the patient.  Yes   Patient/family informed of [facility]'s ownership interest in [facility] and [facility], as well as of the fact that they are under no obligation to receive care at these facilities.  PASRR submitted to EDS on       PASRR number received on       Existing PASRR number confirmed on       FL2 transmitted to all facilities in geographic area requested by pt/family on       FL2 transmitted to all facilities within larger geographic area on       Patient informed that his/her managed care company has contracts with or will negotiate with certain facilities, including the following:        Yes   Patient/family informed of bed offers received.  Patient chooses bed at [facility]     Physician recommends and patient chooses bed at      Patient to be transferred to [facility] on 06/16/18.  Patient to be transferred to facility by [name]     Patient family notified on 06/16/18 of transfer.  Name of family member notified:  Son     PHYSICIAN       Additional Comment:     _______________________________________________ Baldemar Lenis, LCSW 06/16/2018, 2:56 PM

## 2018-06-16 NOTE — Progress Notes (Signed)
Gave report to RN Erline Levine at [facility]

## 2018-06-16 NOTE — Progress Notes (Signed)
[name] arrived to transport patient to facility. No new questions or concerns.

## 2018-06-18 ENCOUNTER — Ambulatory Visit: Payer: Medicare Other | Admitting: Neurology

## 2018-06-21 ENCOUNTER — Encounter: Payer: Self-pay | Admitting: Neurology

## 2018-08-16 ENCOUNTER — Ambulatory Visit: Payer: Medicare Other | Admitting: Diagnostic Neuroimaging

## 2018-08-16 ENCOUNTER — Telehealth: Payer: Self-pay | Admitting: *Deleted

## 2018-08-16 NOTE — Telephone Encounter (Signed)
Pt no showed appt today

## 2018-08-17 ENCOUNTER — Telehealth: Payer: Self-pay | Admitting: Diagnostic Neuroimaging

## 2018-08-17 NOTE — Telephone Encounter (Signed)
FYI- Patient is a new patient and has no-showed twice in 2019. °

## 2018-08-17 NOTE — Telephone Encounter (Signed)
Noted  

## 2018-11-17 ENCOUNTER — Emergency Department (HOSPITAL_COMMUNITY): Payer: Medicare Other

## 2018-11-17 ENCOUNTER — Other Ambulatory Visit: Payer: Self-pay

## 2018-11-17 ENCOUNTER — Emergency Department (HOSPITAL_COMMUNITY)
Admission: EM | Admit: 2018-11-17 | Discharge: 2018-11-17 | Disposition: A | Discharge status: Home / Self Care | Payer: Medicare Other | Attending: Emergency Medicine | Admitting: Emergency Medicine

## 2018-11-17 DIAGNOSIS — I130 Hypertensive heart and chronic kidney disease with heart failure and stage 1 through stage 4 chronic kidney disease, or unspecified chronic kidney disease: Secondary | ICD-10-CM | POA: Insufficient documentation

## 2018-11-17 DIAGNOSIS — Z23 Encounter for immunization: Secondary | ICD-10-CM | POA: Insufficient documentation

## 2018-11-17 DIAGNOSIS — W19XXXA Unspecified fall, initial encounter: Secondary | ICD-10-CM | POA: Insufficient documentation

## 2018-11-17 DIAGNOSIS — Y92129 Unspecified place in nursing home as the place of occurrence of the external cause: Secondary | ICD-10-CM | POA: Diagnosis not present

## 2018-11-17 DIAGNOSIS — F0390 Unspecified dementia without behavioral disturbance: Secondary | ICD-10-CM | POA: Insufficient documentation

## 2018-11-17 DIAGNOSIS — Z79899 Other long term (current) drug therapy: Secondary | ICD-10-CM | POA: Insufficient documentation

## 2018-11-17 DIAGNOSIS — N182 Chronic kidney disease, stage 2 (mild): Secondary | ICD-10-CM | POA: Diagnosis not present

## 2018-11-17 DIAGNOSIS — Y939 Activity, unspecified: Secondary | ICD-10-CM | POA: Diagnosis not present

## 2018-11-17 DIAGNOSIS — Y999 Unspecified external cause status: Secondary | ICD-10-CM | POA: Insufficient documentation

## 2018-11-17 DIAGNOSIS — I5042 Chronic combined systolic (congestive) and diastolic (congestive) heart failure: Secondary | ICD-10-CM | POA: Diagnosis not present

## 2018-11-17 DIAGNOSIS — J441 Chronic obstructive pulmonary disease with (acute) exacerbation: Secondary | ICD-10-CM | POA: Insufficient documentation

## 2018-11-17 DIAGNOSIS — S0990XA Unspecified injury of head, initial encounter: Secondary | ICD-10-CM | POA: Diagnosis present

## 2018-11-17 DIAGNOSIS — S0181XA Laceration without foreign body of other part of head, initial encounter: Secondary | ICD-10-CM | POA: Insufficient documentation

## 2018-11-17 DIAGNOSIS — F17210 Nicotine dependence, cigarettes, uncomplicated: Secondary | ICD-10-CM | POA: Diagnosis not present

## 2018-11-17 MED ORDER — TETANUS-DIPHTH-ACELL PERTUSSIS 5-2.5-18.5 LF-MCG/0.5 IM SUSP
0.5000 mL | Freq: Once | INTRAMUSCULAR | Status: AC
  Administered 2018-11-17: 0.5 mL via INTRAMUSCULAR
  Filled 2018-11-17: qty 0.5

## 2018-11-17 NOTE — ED Provider Notes (Signed)
[facility] EMERGENCY DEPARTMENT Provider Note   CSN: [id number] Arrival date & time: 11/17/18  0023     History   Chief Complaint Chief Complaint  Patient presents with  . Fall  . Head Injury    HPI [place] T Tammy [name] is a 78 y.o. female.  The history is provided by the nursing home. The history is limited by the condition of the patient (Dementia).  Fall   Head Injury  She has history of hypertension, chronic kidney disease, heart failure, seizure disorder, dementia and comes in following an unwitnessed fall.  She did suffer a laceration to her forehead.  Past Medical History:  Diagnosis Date  . CHF (congestive heart failure) (HCC)   . CKD (chronic kidney disease)   . COPD (chronic obstructive pulmonary disease) (HCC)   . Dementia   . Hypertension   . Insomnia     Patient Active Problem List   Diagnosis Date Noted  . Adult failure to thrive   . Palliative care by specialist   . DNR (do not resuscitate)   . Weakness generalized   . Respiratory failure (HCC)   . Seizure (HCC)   . Acute encephalopathy 06/04/2018  . Status epilepticus (HCC)   . Acute metabolic encephalopathy   . Acute hypercapnic respiratory failure (HCC) 04/01/2018  . Acute CHF (congestive heart failure) (HCC) 03/29/2018  . CKD (chronic kidney disease) stage 2, GFR 60-89 ml/min 03/29/2018  . Complicated UTI (urinary tract infection)   . Acute confusional state 09/17/2016  . Rhabdomyolysis 07/02/2016  . Fall at home 07/01/2016  . Lactic acid acidosis 12/01/2015  . Metabolic acidosis 12/01/2015  . Protein-calorie malnutrition, severe (HCC) 12/01/2015  . AKI (acute kidney injury) (HCC)   . Hyponatremia   . Hypothermia   . Tachycardia   . Hyperkalemia 11/29/2015  . Acute renal failure (HCC) 11/29/2015  . Chronic combined systolic and diastolic CHF, 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/2015  . Mitral regurgitation 11/29/2015  . Elevated lactic acid level 11/29/2015  . Agitation  11/29/2015  . COPD (chronic obstructive pulmonary disease) (HCC) 11/29/2015  . Dementia (HCC) 11/29/2015  . Confusion   . Acute blood loss anemia 08/21/2015  . Postoperative anemia due to acute blood loss 08/20/2015  . Displaced intertrochanteric fracture of left femur (HCC) 08/19/2015  . Closed left hip fracture (HCC) 08/18/2015  . Secondary cardiomyopathy (HCC)   . Acute systolic congestive heart failure, NYHA class 3 (HCC) 03/02/2015  . COPD exacerbation (HCC) 03/02/2015  . Tobacco abuse 03/02/2015  . Severe protein-calorie malnutrition (HCC) 03/02/2015  . HTN (hypertension) 03/02/2015  . Congestive dilated cardiomyopathy (HCC)   . CVA (cerebral infarction) 02/28/2015  . Stroke [facility])     Past Surgical History:  Procedure Laterality Date  . INTRAMEDULLARY (IM) NAIL INTERTROCHANTERIC Left 08/19/2015   Procedure: INTRAMEDULLARY (IM) NAIL INTERTROCHANTRIC;  Surgeon: Jodi Geralds, MD;  Location: [facility] OR;  Service: Orthopedics;  Laterality: Left;     OB History   No obstetric history on file.      Home Medications    Prior to Admission medications   Medication Sig Start Date End Date Taking? Authorizing Provider  acetaminophen (TYLENOL) 325 MG tablet Take 1 tablet (325 mg total) by mouth every 6 (six) hours as needed for fever. 06/16/18   Rai, Delene Ruffini, MD  carvedilol (COREG) 3.125 MG tablet Take 1 tablet (3.125 mg total) by mouth 2 (two) times daily with a meal. 03/04/15   Rinehuls, Kinnie Scales, PA-C  diphenhydramine-acetaminophen (TYLENOL  PM) 25-500 MG TABS tablet Take 1 tablet by mouth at bedtime as needed (sleep).    [provider]  feeding supplement, ENSURE ENLIVE, (ENSURE ENLIVE) LIQD Take 237 mLs by mouth 3 (three) times daily between meals. 12/01/15   Dhungel, Theda BelfastNishant, MD  feeding supplement, ENSURE ENLIVE, (ENSURE ENLIVE) LIQD Take 237 mLs by mouth 2 (two) times daily between meals. 06/16/18   Rai, Delene Ruffiniipudeep K, MD  furosemide (LASIX) 20 MG tablet Take 20 mg by mouth  daily.    [provider]  levETIRAcetam (KEPPRA) 250 MG tablet Take 1 tablet (250 mg total) by mouth 2 (two) times daily. 06/16/18   Rai, Ripudeep K, MD  lisinopril (PRINIVIL,ZESTRIL) 2.5 MG tablet Take 2.5 mg by mouth daily. 09/13/16   [provider]  sertraline (ZOLOFT) 50 MG tablet Take 50 mg by mouth daily. 09/13/16   [provider]    Family History Family History  Problem Relation Age of Onset  . Congestive Heart Failure Son 3855    Social History Social History   Tobacco Use  . Smoking status: Current Every Day Smoker    Packs/day: 2.00    Years: 50.00    Pack years: 100.00    Types: Cigarettes  . Smokeless tobacco: Never Used  Substance Use Topics  . Alcohol use: No  . Drug use: No     Allergies   Patient has no known allergies.   Review of Systems Review of Systems  Unable to perform ROS: Dementia     Physical Exam Updated Vital Signs BP (!) 152/59   Pulse 82   Temp 98 F (36.7 C) (Oral)   Resp 14   SpO2 100%   Physical Exam Vitals signs and nursing note reviewed.    78 year old female, resting comfortably and in no acute distress. Vital signs are significant for elevated blood pressure. Oxygen saturation is 100%, which is normal. Head is normocephalic.  Laceration noted on left side of the forehead oriented transversely. PERRLA, EOMI. Oropharynx is clear. Neck is nontender without adenopathy or JVD. Back is nontender and there is no CVA tenderness. Lungs are clear without rales, wheezes, or rhonchi. Chest is nontender. Heart has regular rate and rhythm without murmur. Abdomen is soft, flat, nontender without masses or hepatosplenomegaly and peristalsis is normoactive. Extremities have no cyanosis or edema, full range of motion is present. Skin is warm and dry without rash. Neurologic: Awake and alert, oriented to person but not place or time, cranial nerves are intact, there are no motor or sensory deficits.  ED  Treatments / Results   EKG EKG Interpretation  Date/Time:  Wednesday November 17 2018 00:30:14 EST Ventricular Rate:  84 PR Interval:    QRS Duration: 76 QT Interval:  341 QTC Calculation: 403 R Axis:   70 Text Interpretation:  Sinus rhythm Probable left atrial enlargement Anterior infarct, old Nonspecific T abnormalities, lateral leads When compared with ECG of 06/04/2018, Nonspecific ST abnormality has improved Confirmed by Dione BoozeGlick, Rachit Grim ([id number]) on 11/17/2018 12:33:28 AM   Radiology Ct Head Wo Contrast  Result Date: 11/17/2018 CLINICAL DATA:  Recent fall with forehead laceration EXAM: CT HEAD WITHOUT CONTRAST CT CERVICAL SPINE WITHOUT CONTRAST TECHNIQUE: Multidetector CT imaging of the head and cervical spine was performed following the standard protocol without intravenous contrast. Multiplanar CT image reconstructions of the cervical spine were also generated. COMPARISON:  None. FINDINGS: CT HEAD FINDINGS Brain: No evidence of acute infarction, hemorrhage, hydrocephalus, extra-axial collection or mass lesion/mass effect. Vascular:  No hyperdense vessel or unexpected calcification. Skull: Normal. Negative for fracture or focal lesion. Sinuses/Orbits: No acute finding. Other: Left forehead scalp laceration is noted consistent with the recent injury. CT CERVICAL SPINE FINDINGS Alignment: Mild straightening of the normal cervical lordosis is noted which may be related to muscular spasm. Skull base and vertebrae: 7 cervical segments are well visualized. Multilevel osteophytic changes are noted from C2-C7. Facet hypertrophic changes are seen at multiple levels. The odontoid is within normal limits. No fracture or acute facet abnormality is seen. Central canal stenosis is noted most marked at C4-5 and C5-6. Soft tissues and spinal canal: Surrounding soft tissue structures show vascular calcification. No acute abnormality is noted. Upper chest: Within normal limits. Other: None IMPRESSION: CT of the head:  Left forehead scalp laceration without acute intracranial abnormality. CT of the cervical spine: Multilevel degenerative change without acute abnormality. Electronically Signed   By: Alcide Clever M.D.   On: 11/17/2018 01:56   Ct Cervical Spine Wo Contrast  Result Date: 11/17/2018 CLINICAL DATA:  Recent fall with forehead laceration EXAM: CT HEAD WITHOUT CONTRAST CT CERVICAL SPINE WITHOUT CONTRAST TECHNIQUE: Multidetector CT imaging of the head and cervical spine was performed following the standard protocol without intravenous contrast. Multiplanar CT image reconstructions of the cervical spine were also generated. COMPARISON:  None. FINDINGS: CT HEAD FINDINGS Brain: No evidence of acute infarction, hemorrhage, hydrocephalus, extra-axial collection or mass lesion/mass effect. Vascular: No hyperdense vessel or unexpected calcification. Skull: Normal. Negative for fracture or focal lesion. Sinuses/Orbits: No acute finding. Other: Left forehead scalp laceration is noted consistent with the recent injury. CT CERVICAL SPINE FINDINGS Alignment: Mild straightening of the normal cervical lordosis is noted which may be related to muscular spasm. Skull base and vertebrae: 7 cervical segments are well visualized. Multilevel osteophytic changes are noted from C2-C7. Facet hypertrophic changes are seen at multiple levels. The odontoid is within normal limits. No fracture or acute facet abnormality is seen. Central canal stenosis is noted most marked at C4-5 and C5-6. Soft tissues and spinal canal: Surrounding soft tissue structures show vascular calcification. No acute abnormality is noted. Upper chest: Within normal limits. Other: None IMPRESSION: CT of the head: Left forehead scalp laceration without acute intracranial abnormality. CT of the cervical spine: Multilevel degenerative change without acute abnormality. Electronically Signed   By: Alcide Clever M.D.   On: 11/17/2018 01:56    Procedures .Marland KitchenLaceration  Repair Date/Time: 11/17/2018 2:32 AM Performed by: Dione Booze, MD Authorized by: Dione Booze, MD   Consent:    Consent obtained:  Verbal   Consent given by:  Guardian   Risks discussed:  Infection, pain and poor cosmetic result   Alternatives discussed:  No treatment Anesthesia (see MAR for exact dosages):    Anesthesia method:  None Laceration details:    Location:  Face   Face location:  Forehead   Length (cm):  2.5   Depth (mm):  3 Repair type:    Repair type:  Simple Pre-procedure details:    Preparation:  Patient was prepped and draped in usual sterile fashion and imaging obtained to evaluate for foreign bodies Exploration:    Hemostasis achieved with:  Direct pressure   Wound exploration: entire depth of wound probed and visualized     Wound extent: no foreign bodies/material noted     Contaminated: no   Treatment:    Area cleansed with:  Saline   Amount of cleaning:  Standard Skin repair:    Repair method:  Tissue  adhesive Approximation:    Approximation:  Close Post-procedure details:    Dressing:  Open (no dressing)   Patient tolerance of procedure:  Tolerated well, no immediate complications     Medications Ordered in ED Medications  Tdap (BOOSTRIX) injection 0.5 mL (has no administration in time range)     Initial Impression / Assessment and Plan / ED Course  I have reviewed the triage vital signs and the nursing notes.  Pertinent imaging results that were available during my care of the patient were reviewed by me and considered in my medical decision making (see chart for details).  Unwitnessed fall with forehead laceration.  She will be sent for CT of head and cervical spine.  I can find no documentation regarding tetanus immunization, so she will be given Tdap booster.  Old records are reviewed, and she has no relevant past visits.  CT scan shows no acute injury.  Laceration is closed with tissue adhesive and she is discharged back to her skilled  care facility.  Final Clinical Impressions(s) / ED Diagnoses   Final diagnoses:  Fall at nursing home, initial encounter  Forehead laceration, initial encounter    ED Discharge Orders    None       Dione BoozeGlick, Mikeria Valin, MD 11/17/18 [phone]

## 2018-11-17 NOTE — ED Notes (Signed)
Pt's son is taking pt back to facility

## 2018-11-17 NOTE — ED Notes (Signed)
Patient transported to CT 

## 2018-11-17 NOTE — ED Notes (Signed)
Pt back from CT

## 2018-11-17 NOTE — ED Triage Notes (Signed)
Per [facility],  Pt from [facility]. Pt had unwitnessed fall tonight. Pt has lac to forehead. Pt has hx of dementia. Pt denies LOC, pain or headache. Pt states, "Nothing's wrong. I just want y'all to hurry up so I can go back home." No other injuries noted.

## 2019-09-23 ENCOUNTER — Other Ambulatory Visit (HOSPITAL_COMMUNITY): Payer: Self-pay

## 2019-09-23 DIAGNOSIS — R1310 Dysphagia, unspecified: Secondary | ICD-10-CM

## 2019-10-04 ENCOUNTER — Ambulatory Visit (HOSPITAL_COMMUNITY)
Admission: RE | Admit: 2019-10-04 | Discharge: 2019-10-04 | Disposition: A | Discharge status: Home / Self Care | Payer: Medicare Other | Source: Ambulatory Visit | Attending: Internal Medicine | Admitting: Internal Medicine

## 2019-10-04 ENCOUNTER — Other Ambulatory Visit: Payer: Self-pay

## 2019-10-04 DIAGNOSIS — Z87891 Personal history of nicotine dependence: Secondary | ICD-10-CM | POA: Insufficient documentation

## 2019-10-04 DIAGNOSIS — J449 Chronic obstructive pulmonary disease, unspecified: Secondary | ICD-10-CM | POA: Insufficient documentation

## 2019-10-04 DIAGNOSIS — R1310 Dysphagia, unspecified: Secondary | ICD-10-CM | POA: Insufficient documentation

## 2019-10-04 DIAGNOSIS — I509 Heart failure, unspecified: Secondary | ICD-10-CM | POA: Insufficient documentation

## 2019-10-04 DIAGNOSIS — N189 Chronic kidney disease, unspecified: Secondary | ICD-10-CM | POA: Insufficient documentation

## 2019-10-04 DIAGNOSIS — F0390 Unspecified dementia without behavioral disturbance: Secondary | ICD-10-CM | POA: Insufficient documentation

## 2019-10-04 DIAGNOSIS — I130 Hypertensive heart and chronic kidney disease with heart failure and stage 1 through stage 4 chronic kidney disease, or unspecified chronic kidney disease: Secondary | ICD-10-CM | POA: Insufficient documentation

## 2019-10-04 NOTE — Progress Notes (Signed)
Objective Swallowing Evaluation: Type of Study: MBS-Modified Barium Swallow Study   Patient Details  Name: Tammy [name] MRN: [id number] Date of Birth: Jan 25, 1941  Today's Date: 10/04/2019 Time: SLP Start Time (ACUTE ONLY): 1200 -SLP Stop Time (ACUTE ONLY): 1220  SLP Time Calculation (min) (ACUTE ONLY): 20 min   Past Medical History:  Past Medical History:  Diagnosis Date  . CHF (congestive heart failure) (HCC)   . CKD (chronic kidney disease)   . COPD (chronic obstructive pulmonary disease) (HCC)   . Dementia   . Hypertension   . Insomnia    Past Surgical History:  Past Surgical History:  Procedure Laterality Date  . INTRAMEDULLARY (IM) NAIL INTERTROCHANTERIC Left 08/19/2015   Procedure: INTRAMEDULLARY (IM) NAIL INTERTROCHANTRIC;  Surgeon: Jodi Geralds, MD;  Location: [facility] OR;  Service: Orthopedics;  Laterality: Left;   HPI: Pt is a 78 yo female who presents from SNF for OP MBS. She is not able to provide reliable hx, but upon review of documentation from SNF she is currently on a Dys 1 diet and thin liquids with meds crushed in puree. Previous clinical swallow evals (2016, 2019) without significant signs of dysphagia, recommending chopped food (pt edentulous) with thin liquids. PMH: CVA, HTN, CHF, COPD, dementia, tobacco abuse, CKD, FTT, falls, seixures   Subjective: alert, confused, not able to provide hx'    Assessment / Plan / Recommendation  CHL IP CLINICAL IMPRESSIONS 10/04/2019  Clinical Impression Pt has a mild-moderate oral dysphagia, likely primarily impacted by lack of dentition and cognitive impairment with incomplete posterior transit. She has mild lingual residue, that she mostly clears with a cued second swallow (of note, residue looked more significant on imaging, but when looking directly in her oral cavity, pt appeared to have just a coating). There is no aspiration on this study. There are suspected osteophytes (not confirmed by MD) but they do not impede  bolus flow, and although there appears to be esophageal residuals, there is no backflow in the more proximal esophagus. Recommend continuing Dys 1 (puree) diet and thin liquids with meds crushed in puree. If pt/family want to consider a diet upgrade, I think this could be managed clinically with advanced trials given by SLP at SNF.  SLP Visit Diagnosis Dysphagia, oral phase (R13.11)  Attention and concentration deficit following --  Frontal lobe and executive function deficit following --  Impact on safety and function Mild aspiration risk      CHL IP TREATMENT RECOMMENDATION 10/04/2019  Treatment Recommendations Defer treatment plan to f/u with SLP     Prognosis 10/04/2019  Prognosis for Safe Diet Advancement Fair  Barriers to Reach Goals Cognitive deficits  Barriers/Prognosis Comment --    CHL IP DIET RECOMMENDATION 10/04/2019  SLP Diet Recommendations Dysphagia 1 (Puree) solids;Thin liquid  Liquid Administration via Cup;Straw  Medication Administration Crushed with puree  Compensations Slow rate;Small sips/bites  Postural Changes Remain semi-upright after after feeds/meals (Comment);Seated upright at 90 degrees      CHL IP OTHER RECOMMENDATIONS 10/04/2019  Recommended Consults --  Oral Care Recommendations Oral care BID  Other Recommendations --      CHL IP FOLLOW UP RECOMMENDATIONS 10/04/2019  Follow up Recommendations Skilled Nursing facility      [facility] IP FREQUENCY AND DURATION 06/08/2018  Speech Therapy Frequency (ACUTE ONLY) min 1 x/week  Treatment Duration 1 week           CHL IP ORAL PHASE 10/04/2019  Oral Phase Impaired  Oral - Pudding Teaspoon --  Oral -  Pudding Cup --  Oral - Honey Teaspoon --  Oral - Honey Cup --  Oral - Nectar Teaspoon --  Oral - Nectar Cup --  Oral - Nectar Straw --  Oral - Thin Teaspoon --  Oral - Thin Cup Reduced posterior propulsion;Lingual/palatal residue  Oral - Thin Straw Reduced posterior propulsion;Lingual/palatal residue  Oral  - Puree Reduced posterior propulsion;Lingual/palatal residue  Oral - Mech Soft Reduced posterior propulsion;Lingual/palatal residue;Impaired mastication  Oral - Regular --  Oral - Multi-Consistency --  Oral - Pill --  Oral Phase - Comment --    CHL IP PHARYNGEAL PHASE 10/04/2019  Pharyngeal Phase WFL  Pharyngeal- Pudding Teaspoon --  Pharyngeal --  Pharyngeal- Pudding Cup --  Pharyngeal --  Pharyngeal- Honey Teaspoon --  Pharyngeal --  Pharyngeal- Honey Cup --  Pharyngeal --  Pharyngeal- Nectar Teaspoon --  Pharyngeal --  Pharyngeal- Nectar Cup --  Pharyngeal --  Pharyngeal- Nectar Straw --  Pharyngeal --  Pharyngeal- Thin Teaspoon --  Pharyngeal --  Pharyngeal- Thin Cup --  Pharyngeal --  Pharyngeal- Thin Straw --  Pharyngeal --  Pharyngeal- Puree --  Pharyngeal --  Pharyngeal- Mechanical Soft --  Pharyngeal --  Pharyngeal- Regular --  Pharyngeal --  Pharyngeal- Multi-consistency --  Pharyngeal --  Pharyngeal- Pill --  Pharyngeal --  Pharyngeal Comment --     CHL IP CERVICAL ESOPHAGEAL PHASE 10/04/2019  Cervical Esophageal Phase WFL  Pudding Teaspoon --  Pudding Cup --  Honey Teaspoon --  Honey Cup --  Nectar Teaspoon --  Nectar Cup --  Nectar Straw --  Thin Teaspoon --  Thin Cup --  Thin Straw --  Puree --  Mechanical Soft --  Regular --  Multi-consistency --  Pill --  Cervical Esophageal Comment --      Osie Bond., M.A. [place] [facility] Pager [phone] Office [phone]  10/04/2019, 2:44 PM

## 2020-11-23 ENCOUNTER — Inpatient Hospital Stay (HOSPITAL_COMMUNITY)
Admission: EM | Admit: 2020-11-23 | Discharge: 2020-11-29 | DRG: 682 | Disposition: A | Discharge status: Skilled Nursing Facility | Payer: Medicare Other | Attending: Internal Medicine | Admitting: Internal Medicine

## 2020-11-23 ENCOUNTER — Other Ambulatory Visit: Payer: Self-pay

## 2020-11-23 ENCOUNTER — Encounter (HOSPITAL_COMMUNITY): Payer: Self-pay

## 2020-11-23 ENCOUNTER — Emergency Department (HOSPITAL_COMMUNITY): Payer: Medicare Other

## 2020-11-23 DIAGNOSIS — N179 Acute kidney failure, unspecified: Principal | ICD-10-CM | POA: Diagnosis present

## 2020-11-23 DIAGNOSIS — J449 Chronic obstructive pulmonary disease, unspecified: Secondary | ICD-10-CM | POA: Diagnosis present

## 2020-11-23 DIAGNOSIS — I10 Essential (primary) hypertension: Secondary | ICD-10-CM | POA: Diagnosis present

## 2020-11-23 DIAGNOSIS — E860 Dehydration: Secondary | ICD-10-CM | POA: Diagnosis present

## 2020-11-23 DIAGNOSIS — Z8249 Family history of ischemic heart disease and other diseases of the circulatory system: Secondary | ICD-10-CM

## 2020-11-23 DIAGNOSIS — Z79899 Other long term (current) drug therapy: Secondary | ICD-10-CM

## 2020-11-23 DIAGNOSIS — I5042 Chronic combined systolic (congestive) and diastolic (congestive) heart failure: Secondary | ICD-10-CM | POA: Diagnosis present

## 2020-11-23 DIAGNOSIS — F17210 Nicotine dependence, cigarettes, uncomplicated: Secondary | ICD-10-CM | POA: Diagnosis present

## 2020-11-23 DIAGNOSIS — Z515 Encounter for palliative care: Secondary | ICD-10-CM

## 2020-11-23 DIAGNOSIS — I420 Dilated cardiomyopathy: Secondary | ICD-10-CM | POA: Diagnosis present

## 2020-11-23 DIAGNOSIS — N189 Chronic kidney disease, unspecified: Secondary | ICD-10-CM | POA: Diagnosis present

## 2020-11-23 DIAGNOSIS — Z20822 Contact with and (suspected) exposure to covid-19: Secondary | ICD-10-CM | POA: Diagnosis present

## 2020-11-23 DIAGNOSIS — G9341 Metabolic encephalopathy: Secondary | ICD-10-CM | POA: Diagnosis present

## 2020-11-23 DIAGNOSIS — R627 Adult failure to thrive: Secondary | ICD-10-CM

## 2020-11-23 DIAGNOSIS — Z66 Do not resuscitate: Secondary | ICD-10-CM | POA: Diagnosis present

## 2020-11-23 DIAGNOSIS — I130 Hypertensive heart and chronic kidney disease with heart failure and stage 1 through stage 4 chronic kidney disease, or unspecified chronic kidney disease: Secondary | ICD-10-CM | POA: Diagnosis present

## 2020-11-23 DIAGNOSIS — R32 Unspecified urinary incontinence: Secondary | ICD-10-CM | POA: Diagnosis present

## 2020-11-23 DIAGNOSIS — G40909 Epilepsy, unspecified, not intractable, without status epilepticus: Secondary | ICD-10-CM

## 2020-11-23 DIAGNOSIS — F0390 Unspecified dementia without behavioral disturbance: Secondary | ICD-10-CM | POA: Diagnosis present

## 2020-11-23 DIAGNOSIS — L8990 Pressure ulcer of unspecified site, unspecified stage: Secondary | ICD-10-CM | POA: Diagnosis present

## 2020-11-23 DIAGNOSIS — Z87440 Personal history of urinary (tract) infections: Secondary | ICD-10-CM

## 2020-11-23 DIAGNOSIS — R4182 Altered mental status, unspecified: Secondary | ICD-10-CM

## 2020-11-23 LAB — CBC WITH DIFFERENTIAL/PLATELET
Abs Immature Granulocytes: 0.07 10*3/uL (ref 0.00–0.07)
Basophils Absolute: 0 10*3/uL (ref 0.0–0.1)
Basophils Relative: 0 %
Eosinophils Absolute: 0.2 10*3/uL (ref 0.0–0.5)
Eosinophils Relative: 2 %
HCT: 36.7 % (ref 36.0–46.0)
Hemoglobin: 12 g/dL (ref 12.0–15.0)
Immature Granulocytes: 1 %
Lymphocytes Relative: 36 %
Lymphs Abs: 4.7 10*3/uL — ABNORMAL HIGH (ref 0.7–4.0)
MCH: 31.3 pg (ref 26.0–34.0)
MCHC: 32.7 g/dL (ref 30.0–36.0)
MCV: 95.6 fL (ref 80.0–100.0)
Monocytes Absolute: 1.7 10*3/uL — ABNORMAL HIGH (ref 0.1–1.0)
Monocytes Relative: 13 %
Neutro Abs: 6.3 10*3/uL (ref 1.7–7.7)
Neutrophils Relative %: 48 %
Platelets: 275 10*3/uL (ref 150–400)
RBC: 3.84 MIL/uL — ABNORMAL LOW (ref 3.87–5.11)
RDW: 13.7 % (ref 11.5–15.5)
WBC: 13 10*3/uL — ABNORMAL HIGH (ref 4.0–10.5)
nRBC: 0 % (ref 0.0–0.2)

## 2020-11-23 LAB — COMPREHENSIVE METABOLIC PANEL
ALT: 27 U/L (ref 0–44)
AST: 21 U/L (ref 15–41)
Albumin: 3.4 g/dL — ABNORMAL LOW (ref 3.5–5.0)
Alkaline Phosphatase: 40 U/L (ref 38–126)
Anion gap: 9 (ref 5–15)
BUN: 58 mg/dL — ABNORMAL HIGH (ref 8–23)
CO2: 22 mmol/L (ref 22–32)
Calcium: 8.7 mg/dL — ABNORMAL LOW (ref 8.9–10.3)
Chloride: 110 mmol/L (ref 98–111)
Creatinine, Ser: 1.5 mg/dL — ABNORMAL HIGH (ref 0.44–1.00)
GFR, Estimated: 35 mL/min — ABNORMAL LOW (ref >60)
Glucose, Bld: 94 mg/dL (ref 70–99)
Potassium: 4.7 mmol/L (ref 3.5–5.1)
Sodium: 141 mmol/L (ref 135–145)
Total Bilirubin: 0.7 mg/dL (ref 0.3–1.2)
Total Protein: 6.6 g/dL (ref 6.5–8.1)

## 2020-11-23 LAB — RESP PANEL BY RT-PCR (FLU A&B, COVID) ARPGX2
Influenza A by PCR: NEGATIVE
Influenza B by PCR: NEGATIVE
SARS Coronavirus 2 by RT PCR: NEGATIVE

## 2020-11-23 LAB — TSH: TSH: 1.503 u[IU]/mL (ref 0.350–4.500)

## 2020-11-23 LAB — AMMONIA: Ammonia: 38 umol/L — ABNORMAL HIGH (ref 9–35)

## 2020-11-23 LAB — CBG MONITORING, ED: Glucose-Capillary: 84 mg/dL (ref 70–99)

## 2020-11-23 MED ORDER — LACTATED RINGERS IV BOLUS
1000.0000 mL | Freq: Once | INTRAVENOUS | Status: AC
  Administered 2020-11-23: 1000 mL via INTRAVENOUS

## 2020-11-23 NOTE — ED Triage Notes (Signed)
Pt brought in by [facility] from [facility] d/t RN at the facility reporting AMS. Staff there reports she has been lethargic, slow to speak & confused more than baseline. They report that she usually refuses allowing them to draw her blood and she allowed it today & said that was not normal for her. Her BUN & creatinie was elevated & valproic acid was low. She has a Hx of UTI & HF. EMS reports her V/S were 106 bpm, resp 28, CBG 170.

## 2020-11-23 NOTE — ED Notes (Signed)
Attempted IV insertion, no success. Will put in for IV team.

## 2020-11-23 NOTE — ED Notes (Signed)
Pt pulled out IV after multiple redirections from this RN

## 2020-11-23 NOTE — ED Provider Notes (Signed)
[facility]   CSN: [id number] Arrival date & time: 11/23/20  1612     History Chief Complaint: AMS  Tammy [name] is a 80 y.o. female with h/o CKD, COPD, dementia, HTN, and CHF (LVEF 25-30%) [facility] for [facility]MS and concern for UTI. Patient usually awake, alert, and oriented, and can carry conversation. Today, patient more tired and not as talkative with nursing facility staff. Patient also developed new urinary incontinence today; she is usually able to let staff knows when she needs to urinate. Lab work done today concerning for AKI, prompting EMS to be called who transported patient to ED for further evaluation. Patient denies chest pain, SOB, N/V/D, or abdominal pain.  The history is provided by the patient, the EMS personnel, a relative and medical records. The history is limited by the condition of the patient.  Altered Mental Status Presenting symptoms: confusion, disorientation and lethargy   Severity:  Moderate Most recent episode:  Today Episode history:  Single Duration:  1 day Timing:  Constant Progression:  Worsening Chronicity:  New Context: nursing home resident   Associated symptoms: bladder incontinence   Associated symptoms: no abdominal pain, no difficulty breathing, no fever, no headaches, no nausea, no palpitations, no rash, no seizures and no vomiting        Past Medical History:  Diagnosis Date  . CHF (congestive heart failure) (HCC)   . CKD (chronic kidney disease)   . COPD (chronic obstructive pulmonary disease) (HCC)   . Dementia (HCC)   . Hypertension   . Insomnia     Patient Active Problem List   Diagnosis Date Noted  . Adult failure to thrive   . Palliative care by specialist   . DNR (do not resuscitate)   . Weakness generalized   . Respiratory failure (HCC)   . Seizure (HCC)   . Acute encephalopathy 06/04/2018  . Status epilepticus (HCC)   . Acute  metabolic encephalopathy   . Acute hypercapnic respiratory failure (HCC) 04/01/2018  . Acute CHF (congestive heart failure) (HCC) 03/29/2018  . CKD (chronic kidney disease) stage 2, GFR 60-89 ml/min 03/29/2018  . Complicated UTI (urinary tract infection)   . Acute confusional state 09/17/2016  . Rhabdomyolysis 07/02/2016  . Fall at home 07/01/2016  . Lactic acid acidosis 12/01/2015  . Metabolic acidosis 12/01/2015  . Protein-calorie malnutrition, severe (HCC) 12/01/2015  . AKI (acute kidney injury) (HCC)   . Hyponatremia   . Hypothermia   . Tachycardia   . Hyperkalemia 11/29/2015  . Acute renal failure (HCC) 11/29/2015  . Chronic combined systolic and diastolic CHF, 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/2015  . Mitral regurgitation 11/29/2015  . Elevated lactic acid level 11/29/2015  . Agitation 11/29/2015  . COPD (chronic obstructive pulmonary disease) (HCC) 11/29/2015  . Dementia (HCC) 11/29/2015  . Confusion   . Acute blood loss anemia 08/21/2015  . Postoperative anemia due to acute blood loss 08/20/2015  . Displaced intertrochanteric fracture of left femur (HCC) 08/19/2015  . Closed left hip fracture (HCC) 08/18/2015  . Secondary cardiomyopathy (HCC)   . Acute systolic congestive heart failure, NYHA class 3 (HCC) 03/02/2015  . COPD exacerbation (HCC) 03/02/2015  . Tobacco abuse 03/02/2015  . Severe protein-calorie malnutrition (HCC) 03/02/2015  . HTN (hypertension) 03/02/2015  . Congestive dilated cardiomyopathy (HCC)   . CVA (cerebral infarction) 02/28/2015  . Stroke [facility](HCC)     Past Surgical History:  Procedure Laterality  Date  . INTRAMEDULLARY (IM) NAIL INTERTROCHANTERIC Left 08/19/2015   Procedure: INTRAMEDULLARY (IM) NAIL INTERTROCHANTRIC;  Surgeon: Jodi Geralds, MD;  Location: [facility] OR;  Service: Orthopedics;  Laterality: Left;     OB History   No obstetric history on file.     Family History  Problem Relation Age of Onset  . Congestive Heart Failure Son 32    Social  History   Tobacco Use  . Smoking status: Current Every Day Smoker    Packs/day: 2.00    Years: 50.00    Pack years: 100.00    Types: Cigarettes  . Smokeless tobacco: Never Used  Vaping Use  . Vaping Use: Never used  Substance Use Topics  . Alcohol use: No  . Drug use: No    Home Medications Prior to Admission medications   Medication Sig Start Date End Date Taking? Authorizing Provider  acetaminophen (TYLENOL) 325 MG tablet Take 1 tablet (325 mg total) by mouth every 6 (six) hours as needed for fever. 06/16/18   Rai, Delene Ruffini, MD  acetaminophen (TYLENOL) 500 MG tablet Take 500 mg by mouth at bedtime as needed for mild pain.    [provider]  carvedilol (COREG) 3.125 MG tablet Take 1 tablet (3.125 mg total) by mouth 2 (two) times daily with a meal. 03/04/15   Rinehuls, Kinnie Scales, PA-C  furosemide (LASIX) 20 MG tablet Take 20 mg by mouth daily.    [provider]  levETIRAcetam (KEPPRA) 250 MG tablet Take 1 tablet (250 mg total) by mouth 2 (two) times daily. 06/16/18   Rai, Ripudeep K, MD  lisinopril (PRINIVIL,ZESTRIL) 2.5 MG tablet Take 2.5 mg by mouth daily. 09/13/16   [provider]  sertraline (ZOLOFT) 50 MG tablet Take 50 mg by mouth daily. 09/13/16   [provider]  UNABLE TO FIND Take 1 each by mouth 3 (three) times daily with meals. Med Name: [facility]    [provider]    Allergies    Patient has no known allergies.  Review of Systems   Review of Systems  Constitutional: Negative for chills and fever.  HENT: Negative for ear pain and sore throat.   Eyes: Negative for pain and visual disturbance.  Respiratory: Negative for cough and shortness of breath.   Cardiovascular: Negative for chest pain and palpitations.  Gastrointestinal: Negative for abdominal pain, nausea and vomiting.  Genitourinary: Positive for bladder incontinence. Negative for dysuria and hematuria.  Musculoskeletal: Negative for arthralgias and back pain.   Skin: Negative for color change and rash.  Neurological: Negative for seizures, syncope and headaches.  Psychiatric/Behavioral: Positive for confusion.  All other systems reviewed and are negative.   Physical Exam Updated Vital Signs BP (!) 161/137   Pulse 90   Temp 98.1 F (36.7 C) (Oral)   Resp (!) 23   SpO2 96%   Physical Exam Vitals and nursing note reviewed.  Constitutional:      General: She is awake. She is not in acute distress.    Appearance: She is well-developed, normal weight and well-nourished. She is not ill-appearing.  HENT:     Head: Normocephalic and atraumatic.     Right Ear: External ear normal.     Left Ear: External ear normal.     Nose: Nose normal. No congestion or rhinorrhea.     Mouth/Throat:     Mouth: Mucous membranes are dry.     Pharynx: Oropharynx is clear.  Eyes:     General: No scleral  icterus.       Right eye: No discharge.        Left eye: No discharge.     Extraocular Movements: Extraocular movements intact.     Conjunctiva/sclera: Conjunctivae normal.     Pupils: Pupils are equal, round, and reactive to light.  Cardiovascular:     Rate and Rhythm: Normal rate and regular rhythm.     Heart sounds: No murmur heard.   Pulmonary:     Effort: Pulmonary effort is normal. No respiratory distress.     Breath sounds: Normal breath sounds. No wheezing, rhonchi or rales.  Abdominal:     General: Abdomen is flat. There is no distension.     Palpations: Abdomen is soft.     Tenderness: There is no abdominal tenderness. There is no guarding or rebound.  Musculoskeletal:        General: No edema.     Cervical back: Neck supple.     Right lower leg: No edema.     Left lower leg: No edema.  Skin:    General: Skin is warm and dry.     Findings: No rash.  Neurological:     General: No focal deficit present.     Mental Status: She is alert. She is disoriented.     Motor: No weakness.  Psychiatric:        Mood and Affect: Mood and affect  and mood normal.        Behavior: Behavior normal. Behavior is cooperative.     ED Results / Procedures / Treatments   Labs (all labs ordered are listed, but only abnormal results are displayed) Labs Reviewed  COMPREHENSIVE METABOLIC PANEL - Abnormal; Notable for the following components:      Result Value   BUN 58 (*)    Creatinine, Ser 1.50 (*)    Calcium 8.7 (*)    Albumin 3.4 (*)    GFR, Estimated 35 (*)    All other components within normal limits  CBC WITH DIFFERENTIAL/PLATELET - Abnormal; Notable for the following components:   WBC 13.0 (*)    RBC 3.84 (*)    Lymphs Abs 4.7 (*)    Monocytes Absolute 1.7 (*)    All other components within normal limits  AMMONIA - Abnormal; Notable for the following components:   Ammonia 38 (*)    All other components within normal limits  RESP PANEL BY RT-PCR (FLU A&B, COVID) ARPGX2  CULTURE, BLOOD (ROUTINE X 2)  CULTURE, BLOOD (ROUTINE X 2)  URINE CULTURE  TSH  URINALYSIS, COMPLETE (UACMP) WITH MICROSCOPIC  RAPID URINE DRUG SCREEN, HOSP PERFORMED  LACTIC ACID, PLASMA  CBG MONITORING, ED  I-STAT VENOUS BLOOD GAS, ED    EKG EKG Interpretation  Date/Time:  Friday November 23 2020 16:52:03 EST Ventricular Rate:  83 PR Interval:    QRS Duration: 82 QT Interval:  347 QTC Calculation: 408 R Axis:   78 Text Interpretation: Sinus rhythm Anteroseptal infarct, age indeterminate When compared with ECG of 11/17/2020, No significant change was found Confirmed by Dione Booze ([id number]) on 11/23/2020 11:53:10 PM   Radiology CT HEAD WO CONTRAST  Result Date: 11/23/2020 CLINICAL DATA:  Altered level of consciousness EXAM: CT HEAD WITHOUT CONTRAST TECHNIQUE: Contiguous axial images were obtained from the base of the skull through the vertex without intravenous contrast. COMPARISON:  11/17/2018 FINDINGS: Brain: Confluent hypodensities in the periventricular white matter are most consistent with chronic small vessel ischemic changes. No signs of  acute infarct or  hemorrhage. Lateral ventricles and remaining midline structures are unremarkable. No acute extra-axial fluid collections. No mass effect. Vascular: No hyperdense vessel or unexpected calcification. Skull: Normal. Negative for fracture or focal lesion. Sinuses/Orbits: No acute finding. Other: None. IMPRESSION: 1. Chronic small-vessel ischemic changes in the white matter. No acute intracranial process. Electronically Signed   By: Sharlet Salina M.D.   On: 11/23/2020 17:58   DG Chest Port 1 View  Result Date: 11/23/2020 CLINICAL DATA:  Lethargic, confusion EXAM: PORTABLE CHEST 1 VIEW COMPARISON:  06/06/2018 FINDINGS: Single frontal view of the chest was obtained with the patient rotated to the right. The cardiac silhouette is unremarkable. No airspace disease, effusion, or pneumothorax. No acute bony abnormalities. IMPRESSION: 1. No acute intrathoracic process. Electronically Signed   By: Sharlet Salina M.D.   On: 11/23/2020 16:56    Procedures Procedures  Medications Ordered in ED Medications  lactated ringers bolus 1,000 mL (1,000 mLs Intravenous New Bag/Given 11/23/20 2220)    ED Course  I have reviewed the triage vital signs and the nursing notes.  Pertinent labs & imaging results that were available during my care of the patient were reviewed by me and considered in my medical decision making (see chart for details).    MDM Rules/Calculators/A&P                          Patient is a 80yoF with history and physical as described above who presents to the ED for AMS and concern for UTI. VS reassuring and HDS. No focal neuro deficits on exam. Patient awake, follows some commands, and oriented to self. Broad AMS and septic workup initiated in addition to head CT. Initial treatment includes IVF bolus.  CT head and CXR unremarkable. Labs notable for AKI and leukocytosis with WBC 13. Suspect AKI likely prerenal given poor PO intake. UA withstanding, no metabolic source for AMS yet  identified. Patient warrants admission for further AMS workup, AKI evaluation, and hydration. At time of sign out, UA still pending. Plan to admit once UTI resulted to determine need for antibiotics. Patient otherwise remained HDS with no acute events during ED course.   Transfer for care at 2330 to [facility]. Please refer to her note for further MDM and ED events.   Final Clinical Impression(s) / ED Diagnoses Final diagnoses:  Altered mental status, unspecified altered mental status type  AKI (acute kidney injury) [facility])    Rx / DC Orders ED Discharge Orders    None       Tonia Brooms, MD 11/24/20 Valentina Lucks    Pricilla Loveless, MD 11/24/20 [phone]

## 2020-11-24 DIAGNOSIS — G9341 Metabolic encephalopathy: Secondary | ICD-10-CM

## 2020-11-24 DIAGNOSIS — I5042 Chronic combined systolic (congestive) and diastolic (congestive) heart failure: Secondary | ICD-10-CM

## 2020-11-24 DIAGNOSIS — F0151 Vascular dementia with behavioral disturbance: Secondary | ICD-10-CM | POA: Diagnosis not present

## 2020-11-24 DIAGNOSIS — N179 Acute kidney failure, unspecified: Secondary | ICD-10-CM | POA: Diagnosis not present

## 2020-11-24 DIAGNOSIS — I10 Essential (primary) hypertension: Secondary | ICD-10-CM

## 2020-11-24 DIAGNOSIS — G40909 Epilepsy, unspecified, not intractable, without status epilepticus: Secondary | ICD-10-CM

## 2020-11-24 LAB — RAPID URINE DRUG SCREEN, HOSP PERFORMED
Amphetamines: NOT DETECTED
Barbiturates: NOT DETECTED
Benzodiazepines: NOT DETECTED
Cocaine: NOT DETECTED
Opiates: NOT DETECTED
Tetrahydrocannabinol: NOT DETECTED

## 2020-11-24 LAB — URINALYSIS, COMPLETE (UACMP) WITH MICROSCOPIC
Bilirubin Urine: NEGATIVE
Glucose, UA: NEGATIVE mg/dL
Hgb urine dipstick: NEGATIVE
Ketones, ur: NEGATIVE mg/dL
Nitrite: NEGATIVE
Protein, ur: NEGATIVE mg/dL
Specific Gravity, Urine: 1.021 (ref 1.005–1.030)
pH: 6 (ref 5.0–8.0)

## 2020-11-24 LAB — CBC
HCT: 38 % (ref 36.0–46.0)
Hemoglobin: 12 g/dL (ref 12.0–15.0)
MCH: 30.4 pg (ref 26.0–34.0)
MCHC: 31.6 g/dL (ref 30.0–36.0)
MCV: 96.2 fL (ref 80.0–100.0)
Platelets: 250 10*3/uL (ref 150–400)
RBC: 3.95 MIL/uL (ref 3.87–5.11)
RDW: 13.4 % (ref 11.5–15.5)
WBC: 14.1 10*3/uL — ABNORMAL HIGH (ref 4.0–10.5)
nRBC: 0 % (ref 0.0–0.2)

## 2020-11-24 LAB — BASIC METABOLIC PANEL
Anion gap: 10 (ref 5–15)
BUN: 42 mg/dL — ABNORMAL HIGH (ref 8–23)
CO2: 20 mmol/L — ABNORMAL LOW (ref 22–32)
Calcium: 8.9 mg/dL (ref 8.9–10.3)
Chloride: 109 mmol/L (ref 98–111)
Creatinine, Ser: 1.17 mg/dL — ABNORMAL HIGH (ref 0.44–1.00)
GFR, Estimated: 47 mL/min — ABNORMAL LOW (ref >60)
Glucose, Bld: 96 mg/dL (ref 70–99)
Potassium: 4.4 mmol/L (ref 3.5–5.1)
Sodium: 139 mmol/L (ref 135–145)

## 2020-11-24 LAB — VALPROIC ACID LEVEL: Valproic Acid Lvl: 46 ug/mL — ABNORMAL LOW (ref 50.0–100.0)

## 2020-11-24 LAB — LACTIC ACID, PLASMA: Lactic Acid, Venous: 1.2 mmol/L (ref 0.5–1.9)

## 2020-11-24 MED ORDER — ONDANSETRON HCL 4 MG/2ML IJ SOLN: 4.0000 mg | Freq: Four times a day (QID) | INTRAMUSCULAR | Status: DC | PRN

## 2020-11-24 MED ORDER — LACTATED RINGERS IV SOLN: INTRAVENOUS | Status: DC

## 2020-11-24 MED ORDER — MEGESTROL ACETATE 400 MG/10ML PO SUSP
400.0000 mg | Freq: Two times a day (BID) | ORAL | Status: AC
  Administered 2020-11-24 – 2020-11-25 (×2): 400 mg via ORAL
  Filled 2020-11-24 (×6): qty 10

## 2020-11-24 MED ORDER — BOOST / RESOURCE BREEZE PO LIQD CUSTOM
237.0000 mL | Freq: Three times a day (TID) | ORAL | Status: DC
  Administered 2020-11-25 (×2): 1 via ORAL
  Administered 2020-11-26: 237 mL via ORAL
  Administered 2020-11-26 – 2020-11-29 (×7): 1 via ORAL

## 2020-11-24 MED ORDER — DICLOFENAC SODIUM 1 % EX GEL
4.0000 g | Freq: Two times a day (BID) | CUTANEOUS | Status: DC
  Administered 2020-11-25 – 2020-11-27 (×4): 4 g via TOPICAL
  Filled 2020-11-24: qty 100

## 2020-11-24 MED ORDER — ENOXAPARIN SODIUM 30 MG/0.3ML ~~LOC~~ SOLN
30.0000 mg | SUBCUTANEOUS | Status: DC
  Administered 2020-11-24 – 2020-11-26 (×3): 30 mg via SUBCUTANEOUS
  Filled 2020-11-24 (×6): qty 0.3

## 2020-11-24 MED ORDER — DIVALPROEX SODIUM 125 MG PO CSDR
250.0000 mg | DELAYED_RELEASE_CAPSULE | Freq: Every day | ORAL | Status: DC
  Administered 2020-11-24 – 2020-11-28 (×5): 250 mg via ORAL
  Filled 2020-11-24 (×6): qty 2

## 2020-11-24 MED ORDER — LEVETIRACETAM 250 MG PO TABS
250.0000 mg | ORAL_TABLET | Freq: Every day | ORAL | Status: DC
  Administered 2020-11-24 – 2020-11-29 (×4): 250 mg via ORAL
  Filled 2020-11-24 (×6): qty 1

## 2020-11-24 MED ORDER — OLANZAPINE 5 MG PO TABS
5.0000 mg | ORAL_TABLET | Freq: Every day | ORAL | Status: DC
  Administered 2020-11-24 – 2020-11-28 (×6): 5 mg via ORAL
  Filled 2020-11-24 (×5): qty 1

## 2020-11-24 MED ORDER — DIVALPROEX SODIUM 125 MG PO CSDR
125.0000 mg | DELAYED_RELEASE_CAPSULE | Freq: Two times a day (BID) | ORAL | Status: DC
  Administered 2020-11-24 – 2020-11-29 (×9): 125 mg via ORAL
  Filled 2020-11-24 (×12): qty 1

## 2020-11-24 MED ORDER — SERTRALINE HCL 50 MG PO TABS
50.0000 mg | ORAL_TABLET | Freq: Every day | ORAL | Status: DC
  Administered 2020-11-24 – 2020-11-29 (×4): 50 mg via ORAL
  Filled 2020-11-24 (×6): qty 1

## 2020-11-24 MED ORDER — ONDANSETRON HCL 4 MG PO TABS: 4.0000 mg | ORAL_TABLET | Freq: Four times a day (QID) | ORAL | Status: DC | PRN

## 2020-11-24 MED ORDER — CARVEDILOL 3.125 MG PO TABS
3.1250 mg | ORAL_TABLET | Freq: Two times a day (BID) | ORAL | Status: DC
  Administered 2020-11-24 – 2020-11-29 (×9): 3.125 mg via ORAL
  Filled 2020-11-24 (×11): qty 1

## 2020-11-24 MED ORDER — POLYETHYLENE GLYCOL 3350 17 G PO PACK
17.0000 g | PACK | Freq: Every day | ORAL | Status: DC
  Administered 2020-11-24 – 2020-11-26 (×3): 17 g via ORAL
  Filled 2020-11-24 (×6): qty 1

## 2020-11-24 MED ORDER — ACETAMINOPHEN 325 MG PO TABS
650.0000 mg | ORAL_TABLET | Freq: Four times a day (QID) | ORAL | Status: DC | PRN
  Administered 2020-11-25 – 2020-11-27 (×3): 650 mg via ORAL
  Filled 2020-11-24 (×4): qty 2

## 2020-11-24 MED ORDER — ACETAMINOPHEN 650 MG RE SUPP: 650.0000 mg | Freq: Four times a day (QID) | RECTAL | Status: DC | PRN

## 2020-11-24 NOTE — ED Provider Notes (Signed)
1:43 AM Patient care assumed at shift change from MD Mellody Dance.  In short, patient is an 80 year old female with hx of HTN, CHF, dementia presenting from SNF for altered mental status and concern for UTI.  She is usually able to carry on a conversation, but has been more tired today and less talkative.  She was incontinent of urine x1.  Patient noted to have AKI today.  She has received 1 L of LR.  Urinalysis with large leukocytes, but no bacteriuria or nitrites.  Urine culture in process.  Presently, low suspicion for urinary tract infection.  Plan for admission for continued hydration, monitoring of kidney function, reassessment of mental status in comparison with baseline.   3:17 AM Case discussed with Dr. Julian Reil of [facility] who will admit.   Antony Madura, PA-C 11/24/20 [id number]    Dione Booze, MD 11/24/20 [phone]

## 2020-11-24 NOTE — H&P (Signed)
History and Physical    Tammy [name] NIO:[id number] DOB: [birth year] DOA: 11/23/2020  PCP: Laurann Montana, MD  Patient coming from: [facility]  I have personally briefly reviewed patient's old medical records in [facility]  Chief Complaint: AMS  HPI: Tammy [name] is a 80 y.o. female with medical history significant of dementia, CHF, HTN, seizures (only in Aug 2019).  Pt sent in to ED from facility due to lethargy and confusion more than baseline.  Usually she is able to cary on conversation with staff, let them know when she needs to urinate.  Today pt had urinary incontinence which is unusual for her.  Additionally she allowed staff to draw blood work for labs which is also reportedly unusual for her (usually she refuses blood work for labs).  On lab work, BUN and creatinine was elevated, VPA was 27 (low).  No reported seizure activity (only seizures in the past that I can see in her records were in Aug 2019 when she had status epilepticus and intubation after being found unresponsive).   ED Course: WBC 13k, Creat 1.5, BUN 58.  Ammonia 38, COVID neg.  UA with large LE but no nitrites, no WBC, and only rare bacteria.  CXR neg  TSH nl  UDS neg  By the time I am seeing patient: Pt is awake and alert, denies pain, denies fever, headache, nausea.  Eventually patient just tells me to "get out of my room".  Apparently also told 2nd EDP this, as well as RNs.  From RN discussion with son earlier in the evening, sounds like this may be her baseline mental status, and son reports significantly improved from when she initially came in to the ED yesterday around 4pm.  Review of Systems: As per HPI, otherwise all review of systems negative.  Past Medical History:  Diagnosis Date  . CHF (congestive heart failure) (HCC)   . CKD (chronic kidney disease)   . COPD (chronic obstructive pulmonary disease) (HCC)   . Dementia (HCC)   . Hypertension   . Insomnia      Past Surgical History:  Procedure Laterality Date  . INTRAMEDULLARY (IM) NAIL INTERTROCHANTERIC Left 08/19/2015   Procedure: INTRAMEDULLARY (IM) NAIL INTERTROCHANTRIC;  Surgeon: Jodi Geralds, MD;  Location: [facility] OR;  Service: Orthopedics;  Laterality: Left;     reports that she has been smoking cigarettes. She has a 100.00 pack-year smoking history. She has never used smokeless tobacco. She reports that she does not drink alcohol and does not use drugs.  No Known Allergies  Family History  Problem Relation Age of Onset  . Congestive Heart Failure Son 55     Prior to Admission medications   Medication Sig Start Date End Date Taking? Authorizing Provider  diclofenac Sodium (VOLTAREN) 1 % GEL Apply 4 g topically in the morning and at bedtime. Right ankle   Yes [provider]  divalproex (DEPAKOTE SPRINKLE) 125 MG capsule Take 125-250 mg by mouth See admin instructions. 125 mg bid, 250 mg at 2pm 06/24/19  Yes [provider]  Nutritional Supplements (RESOURCE 2.0 PO) Take 120 mLs by mouth 3 (three) times daily.   Yes [provider]  OVER THE COUNTER MEDICATION Take 1 Dose by mouth with breakfast, with lunch, and with evening meal. Mighty shake   Yes [provider]  polyethylene glycol (MIRALAX / GLYCOLAX) 17 g packet Take 17 g by mouth daily.   Yes [provider]  acetaminophen (TYLENOL) 325 MG tablet  Take 1 tablet (325 mg total) by mouth every 6 (six) hours as needed for fever. Patient taking differently: Take 650 mg by mouth in the morning, at noon, and at bedtime. 06/16/18   Rai, Delene Ruffini, MD  acetaminophen (TYLENOL) 500 MG tablet Take 500 mg by mouth at bedtime as needed for mild pain.    [provider]  carvedilol (COREG) 3.125 MG tablet Take 1 tablet (3.125 mg total) by mouth 2 (two) times daily with a meal. 03/04/15   Rinehuls, Kinnie Scales, PA-C  furosemide (LASIX) 20 MG tablet Take 20 mg by mouth daily.    [provider]  levETIRAcetam (KEPPRA) 250 MG tablet Take 1 tablet (250 mg total) by mouth 2 (two) times daily. Patient taking differently: Take 250 mg by mouth daily. 06/16/18   Rai, Ripudeep K, MD  lisinopril (PRINIVIL,ZESTRIL) 2.5 MG tablet Take 2.5 mg by mouth daily. 09/13/16   [provider]  megestrol (MEGACE) 40 MG/ML suspension Take 10 mLs by mouth in the morning and at bedtime. 10/30/20   [provider]  sertraline (ZOLOFT) 50 MG tablet Take 50 mg by mouth daily. 09/13/16   [provider]    Physical Exam: Vitals:   11/24/20 0100 11/24/20 0215 11/24/20 0300 11/24/20 0400  BP: (!) 147/73 (!) 156/59 (!) 151/69 (!) 150/66  Pulse: 94 (!) 102 87 92  Resp: (!) 23 (!) 24 (!) 22 (!) 22  Temp:      TempSrc:      SpO2: 96% 94% 94% 96%    Constitutional: NAD, calm, comfortable Eyes: PERRL, lids and conjunctivae normal ENMT: Mucous membranes are moist. Posterior pharynx clear of any exudate or lesions.Normal dentition.  Neck: normal, supple, no masses, no thyromegaly Respiratory: clear to auscultation bilaterally, no wheezing, no crackles. Normal respiratory effort. No accessory muscle use.  Cardiovascular: Regular rate and rhythm, no murmurs / rubs / gallops. No extremity edema. 2+ pedal pulses. No carotid bruits.  Abdomen: no tenderness, no masses palpated. No hepatosplenomegaly. Bowel sounds positive.  Musculoskeletal: no clubbing / cyanosis. No joint deformity upper and lower extremities. Good ROM, no contractures. Normal muscle tone.  Skin: no rashes, lesions, ulcers. No induration Neurologic: CN 2-12 grossly intact. Sensation intact, DTR normal. Strength 5/5 in all 4.  Psychiatric: Awake, alert, not oriented    Labs on Admission: I have personally reviewed following labs and imaging studies  CBC: Recent Labs  Lab 11/23/20 1643  WBC 13.0*  NEUTROABS 6.3  HGB 12.0  HCT 36.7  MCV 95.6  PLT 275   Basic Metabolic Panel: Recent Labs  Lab 11/23/20 1643  NA  141  K 4.7  CL 110  CO2 22  GLUCOSE 94  BUN 58*  CREATININE 1.50*  CALCIUM 8.7*   GFR: CrCl cannot be calculated (Unknown ideal weight.). Liver Function Tests: Recent Labs  Lab 11/23/20 1643  AST 21  ALT 27  ALKPHOS 40  BILITOT 0.7  PROT 6.6  ALBUMIN 3.4*   No results for input(s): LIPASE, AMYLASE in the last 168 hours. Recent Labs  Lab 11/23/20 1644  AMMONIA 38*   Coagulation Profile: No results for input(s): INR, PROTIME in the last 168 hours. Cardiac Enzymes: No results for input(s): CKTOTAL, CKMB, CKMBINDEX, TROPONINI in the last 168 hours. BNP (last 3 results) No results for input(s): PROBNP in the last 8760 hours. HbA1C: No results for input(s): HGBA1C in the last 72 hours. CBG: Recent Labs  Lab 11/23/20 1636  GLUCAP 84   Lipid  Profile: No results for input(s): CHOL, HDL, LDLCALC, TRIG, CHOLHDL, LDLDIRECT in the last 72 hours. Thyroid Function Tests: Recent Labs    11/23/20 1644  TSH 1.503   Anemia Panel: No results for input(s): VITAMINB12, FOLATE, FERRITIN, TIBC, IRON, RETICCTPCT in the last 72 hours. Urine analysis:    Component Value Date/Time   COLORURINE YELLOW 11/24/2020 0021   APPEARANCEUR CLEAR 11/24/2020 0021   LABSPEC 1.021 11/24/2020 0021   PHURINE 6.0 11/24/2020 0021   GLUCOSEU NEGATIVE 11/24/2020 0021   HGBUR NEGATIVE 11/24/2020 0021   BILIRUBINUR NEGATIVE 11/24/2020 0021   KETONESUR NEGATIVE 11/24/2020 0021   PROTEINUR NEGATIVE 11/24/2020 0021   UROBILINOGEN 0.2 08/19/2015 0756   NITRITE NEGATIVE 11/24/2020 0021   LEUKOCYTESUR LARGE (A) 11/24/2020 0021    Radiological Exams on Admission: CT HEAD WO CONTRAST  Result Date: 11/23/2020 CLINICAL DATA:  Altered level of consciousness EXAM: CT HEAD WITHOUT CONTRAST TECHNIQUE: Contiguous axial images were obtained from the base of the skull through the vertex without intravenous contrast. COMPARISON:  11/17/2018 FINDINGS: Brain: Confluent hypodensities in the periventricular white  matter are most consistent with chronic small vessel ischemic changes. No signs of acute infarct or hemorrhage. Lateral ventricles and remaining midline structures are unremarkable. No acute extra-axial fluid collections. No mass effect. Vascular: No hyperdense vessel or unexpected calcification. Skull: Normal. Negative for fracture or focal lesion. Sinuses/Orbits: No acute finding. Other: None. IMPRESSION: 1. Chronic small-vessel ischemic changes in the white matter. No acute intracranial process. Electronically Signed   By: Sharlet SalinaMichael  Brown M.D.   On: 11/23/2020 17:58   DG Chest Port 1 View  Result Date: 11/23/2020 CLINICAL DATA:  Lethargic, confusion EXAM: PORTABLE CHEST 1 VIEW COMPARISON:  06/06/2018 FINDINGS: Single frontal view of the chest was obtained with the patient rotated to the right. The cardiac silhouette is unremarkable. No airspace disease, effusion, or pneumothorax. No acute bony abnormalities. IMPRESSION: 1. No acute intrathoracic process. Electronically Signed   By: Sharlet SalinaMichael  Brown M.D.   On: 11/23/2020 16:56    EKG: Independently reviewed.  Assessment/Plan Principal Problem:   Acute metabolic encephalopathy Active Problems:   HTN (hypertension)   Chronic combined systolic and diastolic CHF, NYHA class 2 (HCC)   Dementia (HCC)   AKI (acute kidney injury) (HCC)   Seizure disorder (HCC)    1. Acute metabolic encephalopathy - on top of chronic dementia 1. Possibly just due to dehydration / AKI 2. UA: large LE but rare bacteria and 0-5 WBC 3. Holding off on ABx and waiting for BCx first 4. Mental status improved after IVF already per son 5. Not clear if at baseline yet or just "improved" 2. AKI - 1. Pre-renal / dehydration 2. IVF: 1L bolus in ED and LR at 100 cc/hr 3. Repeat BMP in AM (though sounds like when at baseline mental status she will typically refuse labs). 3. HTN - 1. Cont coreg 2. Hold lisinopril due to AKI 4. Chronic combined CHF - 1. Hold  diuretics 5. Seizure disorder - 1. Cont Keppra (only takes once daily) 2. Cont Depakote 3. Recheck VPA level 4. No seizure activity at SNF nor in ED today  DVT prophylaxis: Lovenox Code Status: Full code - per SNF paperwork Family Communication: No family in room Disposition Plan: SNF after mental status improved and AKI resolved Consults called: None Admission status: Place in obs    GARDNER, JARED M. DO [facility]  How to contact the [facility] Attending or Consulting provider 7A - 7P or covering provider during after  hours 7P -7A, for this patient?  1. Check the care team in [name] and look for a) attending/consulting [facility] provider listed and b) the [facility] team listed 2. Log into [email or web address]  Amion Physician Scheduling and messaging for groups and whole hospitals  On call and physician scheduling software for group practices, residents, hospitalists and other medical providers for call, clinic, rotation and shift schedules. On[employer] is a hospital-wide system for scheduling doctors and paging doctors on call. EasyPlot is for scientific plotting and data analysis.  [email or web address]  and use [place]'s universal password to access. If you do not have the password, please contact the hospital operator.  3. Locate the [facility] provider you are looking for under [facility] and page to a number that you can be directly reached. 4. If you still have difficulty reaching the provider, please page the [facility] (Director on Call) for the Hospitalists listed on amion for assistance.  11/24/2020, 4:37 AM

## 2020-11-24 NOTE — ED Notes (Signed)
Offered pt food while son present. Pt denying food at this time.

## 2020-11-24 NOTE — ED Notes (Addendum)
Pts son refusing rectal temperature for pt. PA made aware.

## 2020-11-24 NOTE — ED Notes (Signed)
MS Breakfast Ordered 

## 2020-11-24 NOTE — Progress Notes (Signed)
PROGRESS NOTE  [name] CWC:[id number] DOB: [birth year] DOA: 11/23/2020 PCP: Laurann Montana, MD  Brief History   The patient is an 80 yr old woman with a medical history of dementia,CHF, HTN, and seizures (limited to August 2019). She was sent from a nursing facility due to lethargy and confusion more than baseline. She had also been incontinent which was new for her.   In the ED her valproic acid level was found to be low at 27. She was also found to have leukocytosis with WBC of 13. Her creatinine was 1.5 and her BUN was 58. UA was positive for large LE, but no nitrites, no WBC and only rare bacteria. Urine culture is pending.  [facility] were called to admit the patient for further evaluation and treatment. The patient was admitted to observation status. She is receiving IV fluids. Urine culture has had no growth.  PT/OT has been consulted.   Consultants  . None  Procedures  . None  Antibiotics   Anti-infectives (From admission, onward)   None    .  Subjective  The patient is lying quietly on the gurney. Her son is at bedside. He is anxious to have the patient moved to a bed upstairs. No new complaints.  Objective   Vitals:  Vitals:   11/24/20 1200 11/24/20 1215  BP: (!) 130/54 (!) 142/61  Pulse:  68  Resp:  18  Temp:    SpO2:  100%   Exam:  Constitutional:  . The patient is awake and alert. She is oppositional and confused. She is interactive. She slaps away my hand when I attempt to examine her.  No acute distress. Respiratory:  . No increased work of breathing. . No wheezes, rales, or rhonchi . No tactile fremitus Cardiovascular:  . Regular rate and rhythm . No murmurs, ectopy, or gallups. . No lateral PMI. No thrills. Abdomen:  . Abdomen is soft, non-tender, non-distended . No hernias, masses, or organomegaly . Normoactive bowel sounds.  Musculoskeletal:  . No cyanosis, clubbing, or edema Skin:  . No rashes, lesions, ulcers . palpation  of skin: no induration or nodules Neurologic:  . CN 2-12 intact . Movign all extremities. Psychiatric:  . The patient is confused and agitated.   I have personally reviewed the following:   Today's Data  . BMP, lactic acid, CBC  Micro Data  . Blood cultures x 2: No growth . Urine culture: no growth  Imaging  . CT head: no acute pathology . CXR  Cardiology Data  . EKG  Scheduled Meds: . carvedilol  3.125 mg Oral BID WC  . diclofenac Sodium  4 g Topical BID  . divalproex  125 mg Oral BID  . divalproex  250 mg Oral Q1400  . enoxaparin (LOVENOX) injection  30 mg Subcutaneous Q24H  . levETIRAcetam  250 mg Oral Daily  . megestrol  400 mg Oral BID  . OLANZapine  5 mg Oral QHS  . polyethylene glycol  17 g Oral Daily  . Resource 2.0   Oral TID  . sertraline  50 mg Oral Daily   Continuous Infusions: . lactated ringers 100 mL/hr at 11/24/20 7616    Principal Problem:   Acute metabolic encephalopathy Active Problems:   HTN (hypertension)   Chronic combined systolic and diastolic CHF, NYHA class 2 (HCC)   Dementia (HCC)   AKI (acute kidney injury) (HCC)   Seizure disorder (HCC)   LOS: 0 days   A & P   Acute  metabolic encephalopathy in the setting of chronic dementia Secondary to dehydration / AKI. UTI possibly also playing a role, but UA is equivocal. Urine culture is pending. Blood cultures have had no growth. Per son mental status is improving. Not yet at baseline.   AKI: Appears prerenal. Baseline creatinine appears to be between 0.75 and 1.5. Likely 1.0. Creatinine was 1.5 upon presentation. Likely due to dehydration. The patient received a 1 liter bolus in the ED and continues to receive LR at 100 cc/hr. Monitor creatinine, electrolytes, and volume status. Avoid nephrotoxins and hypotension.   HTN: The patient is normotensive on Coreg 3.125 mg bid. Lisinopril has been held due to AKI.  Chronic combined CHF: Most recent echocardiogram was from 03/30/2018  demonstrated. Will repeat. Diuretics were held.   Seizure disorder: No seizure activity since August 2019. Cont Keppra (only takes once daily), Depakote, and recheck the valproic acid.  I have seen and examined this patient myself. I have spent 34 minutes in his evaluation an d assiss  DVT prophylaxis: Lovenox Code Status: Full code - per SNF paperwork Family Communication: No family in room Disposition Plan: SNF after mental status improved and AKI resolved  Jerick Khachatryan, DO [facility] Direct contact: see [email or web address]  7PM-7AM contact night coverage as above 11/24/2020, 1:15 PM  LOS: 0 days

## 2020-11-24 NOTE — ED Notes (Signed)
Pt continuously taking off monitoring devices after multiple redirection attempts by this RN. Will continue to monitor pt.

## 2020-11-25 DIAGNOSIS — I5042 Chronic combined systolic (congestive) and diastolic (congestive) heart failure: Secondary | ICD-10-CM | POA: Diagnosis present

## 2020-11-25 DIAGNOSIS — Z20822 Contact with and (suspected) exposure to covid-19: Secondary | ICD-10-CM | POA: Diagnosis present

## 2020-11-25 DIAGNOSIS — L8990 Pressure ulcer of unspecified site, unspecified stage: Secondary | ICD-10-CM | POA: Insufficient documentation

## 2020-11-25 DIAGNOSIS — Z87440 Personal history of urinary (tract) infections: Secondary | ICD-10-CM | POA: Diagnosis not present

## 2020-11-25 DIAGNOSIS — R32 Unspecified urinary incontinence: Secondary | ICD-10-CM | POA: Diagnosis present

## 2020-11-25 DIAGNOSIS — F0151 Vascular dementia with behavioral disturbance: Secondary | ICD-10-CM | POA: Diagnosis not present

## 2020-11-25 DIAGNOSIS — G9341 Metabolic encephalopathy: Secondary | ICD-10-CM | POA: Diagnosis present

## 2020-11-25 DIAGNOSIS — R0600 Dyspnea, unspecified: Secondary | ICD-10-CM | POA: Diagnosis not present

## 2020-11-25 DIAGNOSIS — I130 Hypertensive heart and chronic kidney disease with heart failure and stage 1 through stage 4 chronic kidney disease, or unspecified chronic kidney disease: Secondary | ICD-10-CM | POA: Diagnosis present

## 2020-11-25 DIAGNOSIS — Z66 Do not resuscitate: Secondary | ICD-10-CM | POA: Diagnosis present

## 2020-11-25 DIAGNOSIS — Z515 Encounter for palliative care: Secondary | ICD-10-CM | POA: Diagnosis not present

## 2020-11-25 DIAGNOSIS — F17210 Nicotine dependence, cigarettes, uncomplicated: Secondary | ICD-10-CM | POA: Diagnosis present

## 2020-11-25 DIAGNOSIS — Z79899 Other long term (current) drug therapy: Secondary | ICD-10-CM | POA: Diagnosis not present

## 2020-11-25 DIAGNOSIS — F0390 Unspecified dementia without behavioral disturbance: Secondary | ICD-10-CM | POA: Diagnosis present

## 2020-11-25 DIAGNOSIS — J449 Chronic obstructive pulmonary disease, unspecified: Secondary | ICD-10-CM | POA: Diagnosis present

## 2020-11-25 DIAGNOSIS — Z8249 Family history of ischemic heart disease and other diseases of the circulatory system: Secondary | ICD-10-CM | POA: Diagnosis not present

## 2020-11-25 DIAGNOSIS — G40909 Epilepsy, unspecified, not intractable, without status epilepticus: Secondary | ICD-10-CM | POA: Diagnosis present

## 2020-11-25 DIAGNOSIS — N179 Acute kidney failure, unspecified: Secondary | ICD-10-CM | POA: Diagnosis present

## 2020-11-25 DIAGNOSIS — I420 Dilated cardiomyopathy: Secondary | ICD-10-CM | POA: Diagnosis present

## 2020-11-25 DIAGNOSIS — N189 Chronic kidney disease, unspecified: Secondary | ICD-10-CM | POA: Diagnosis present

## 2020-11-25 DIAGNOSIS — E860 Dehydration: Secondary | ICD-10-CM | POA: Diagnosis present

## 2020-11-25 LAB — BASIC METABOLIC PANEL
Anion gap: 11 (ref 5–15)
BUN: 23 mg/dL (ref 8–23)
CO2: 19 mmol/L — ABNORMAL LOW (ref 22–32)
Calcium: 9.3 mg/dL (ref 8.9–10.3)
Chloride: 110 mmol/L (ref 98–111)
Creatinine, Ser: 1.04 mg/dL — ABNORMAL HIGH (ref 0.44–1.00)
GFR, Estimated: 54 mL/min — ABNORMAL LOW (ref >60)
Glucose, Bld: 83 mg/dL (ref 70–99)
Potassium: 5.6 mmol/L — ABNORMAL HIGH (ref 3.5–5.1)
Sodium: 140 mmol/L (ref 135–145)

## 2020-11-25 LAB — URINE CULTURE

## 2020-11-25 LAB — CBC WITH DIFFERENTIAL/PLATELET
Abs Immature Granulocytes: 0.07 10*3/uL (ref 0.00–0.07)
Basophils Absolute: 0.1 10*3/uL (ref 0.0–0.1)
Basophils Relative: 0 %
Eosinophils Absolute: 0.4 10*3/uL (ref 0.0–0.5)
Eosinophils Relative: 3 %
HCT: 36 % (ref 36.0–46.0)
Hemoglobin: 12 g/dL (ref 12.0–15.0)
Immature Granulocytes: 1 %
Lymphocytes Relative: 33 %
Lymphs Abs: 4.3 10*3/uL — ABNORMAL HIGH (ref 0.7–4.0)
MCH: 31.1 pg (ref 26.0–34.0)
MCHC: 33.3 g/dL (ref 30.0–36.0)
MCV: 93.3 fL (ref 80.0–100.0)
Monocytes Absolute: 1.3 10*3/uL — ABNORMAL HIGH (ref 0.1–1.0)
Monocytes Relative: 10 %
Neutro Abs: 7 10*3/uL (ref 1.7–7.7)
Neutrophils Relative %: 53 %
Platelets: 222 10*3/uL (ref 150–400)
RBC: 3.86 MIL/uL — ABNORMAL LOW (ref 3.87–5.11)
RDW: 13.2 % (ref 11.5–15.5)
WBC: 13.1 10*3/uL — ABNORMAL HIGH (ref 4.0–10.5)
nRBC: 0 % (ref 0.0–0.2)

## 2020-11-25 MED ORDER — SODIUM CHLORIDE 0.9 % IV SOLN
1.0000 g | INTRAVENOUS | Status: DC
  Administered 2020-11-25 – 2020-11-28 (×4): 1 g via INTRAVENOUS
  Filled 2020-11-25 (×2): qty 1
  Filled 2020-11-25 (×3): qty 10

## 2020-11-25 MED ORDER — SODIUM CHLORIDE 0.9 % IV BOLUS
500.0000 mL | Freq: Once | INTRAVENOUS | Status: AC
  Administered 2020-11-25: 500 mL via INTRAVENOUS

## 2020-11-25 MED ORDER — MEGESTROL ACETATE 400 MG/10ML PO SUSP
400.0000 mg | Freq: Two times a day (BID) | ORAL | Status: DC
  Administered 2020-11-25 – 2020-11-28 (×5): 400 mg via ORAL
  Filled 2020-11-25 (×9): qty 10

## 2020-11-25 NOTE — Progress Notes (Signed)
PROGRESS NOTE  [name] PPI:[id number] DOB: 1941/04/05 DOA: 11/23/2020 PCP: Laurann Montana, MD  Brief History   The patient is an 80 yr old woman with a medical history of dementia,CHF, HTN, and seizures (limited to August 2019). She was sent from a nursing facility due to lethargy and confusion more than baseline. She had also been incontinent which was new for her.   In the ED her valproic acid level was found to be low at 27. She was also found to have leukocytosis with WBC of 13. Her creatinine was 1.5 and her BUN was 58. UA was positive for large LE, but no nitrites, no WBC and only rare bacteria. Urine culture is pending.  [facility] were called to admit the patient for further evaluation and treatment. The patient was admitted to observation status. She is receiving IV fluids. Urine culture has had no growth.  PT/OT has been consulted.   Urine culture is polymicrobial and must be repeated. She has been started on IV Rocephin.  Consultants  . None  Procedures  . None  Antibiotics   Anti-infectives (From admission, onward)   Start     Dose/Rate Route Frequency Ordered Stop   11/25/20 1700  cefTRIAXone (ROCEPHIN) 1 g in sodium chloride 0.9 % 100 mL IVPB        1 g 200 mL/hr over 30 Minutes Intravenous Every 24 hours 11/25/20 1600       Subjective  The patient resting quietly. She is easily awakened. She is confused and agitated upon awakening.  Objective   Vitals:  Vitals:   11/25/20 0514 11/25/20 0905  BP: (!) 148/73 (!) 141/75  Pulse: 82 80  Resp:  18  Temp: 98.4 F (36.9 C) 98 F (36.7 C)  SpO2: 98% 99%   Exam:  Constitutional:  . The patient is somolent, but is easily awakened. She is confused and agitated. No acute distress. Respiratory:  . No increased work of breathing. . No wheezes, rales, or rhonchi . No tactile fremitus Cardiovascular:  . Regular rate and rhythm . No murmurs, ectopy, or gallups. . No lateral PMI. No  thrills. Abdomen:  . Abdomen is soft, non-tender, non-distended . No hernias, masses, or organomegaly . Normoactive bowel sounds.  Musculoskeletal:  . No cyanosis, clubbing, or edema Skin:  . No rashes, lesions, ulcers . palpation of skin: no induration or nodules Neurologic:  . CN 2-12 intact . Movign all extremities. Psychiatric:  . The patient is confused and agitated.   I have personally reviewed the following:   Today's Data  . BMP CBC  Micro Data  . Blood cultures x 2: No growth . Urine culture: polymicrobial, repeated.  Imaging  . CT head: no acute pathology . CXR  Cardiology Data  . EKG  Scheduled Meds: . carvedilol  3.125 mg Oral BID WC  . diclofenac Sodium  4 g Topical BID  . divalproex  125 mg Oral BID  . divalproex  250 mg Oral Q1400  . enoxaparin (LOVENOX) injection  30 mg Subcutaneous Q24H  . feeding supplement  237 mL Oral TID  . levETIRAcetam  250 mg Oral Daily  . megestrol  400 mg Oral BID  . megestrol  400 mg Oral BID  . OLANZapine  5 mg Oral QHS  . polyethylene glycol  17 g Oral Daily  . sertraline  50 mg Oral Daily   Continuous Infusions: . cefTRIAXone (ROCEPHIN)  IV      Principal Problem:   Acute  metabolic encephalopathy Active Problems:   HTN (hypertension)   Chronic combined systolic and diastolic CHF, NYHA class 2 (HCC)   Dementia (HCC)   AKI (acute kidney injury) (HCC)   Seizure disorder (HCC)   Pressure injury of skin   LOS: 0 days   A & P   Acute metabolic encephalopathy in the setting of chronic dementia Secondary to dehydration / AKI. Patient continues to be confused and   UTI possibly also playing a role, but UA is equivocal. Urine culture is polymicrobial and needs to be repeated. Blood cultures have had no growth. The patient continues to have a leukocytosis. Will start Rocephin.  AKI: Appears prerenal. Baseline creatinine appears to be between 0.75 and 1.5. . Creatinine was 1.5 upon presentation. Likely due to  dehydration. The patient received a 1 liter bolus in the ED and continues to receive LR at 100 cc/hr. Monitor creatinine, electrolytes, and volume status. Avoid nephrotoxins and hypotension. Creatinine this morning is 1.04. Monitor.  HTN: The patient is normotensive on Coreg 3.125 mg bid. Lisinopril has been held due to AKI.  Chronic combined CHF: Most recent echocardiogram was from 03/30/2018 demonstrated. Will repeat. Diuretics were held.   Seizure disorder: No seizure activity since August 2019. Cont Keppra (only takes once daily), Depakote, and recheck the valproic acid.  I have seen and examined this patient myself. I have spent 32 minutes in his evaluation an d assiss  DVT prophylaxis: Lovenox Code Status: Full code - per SNF paperwork Family Communication: No family in room Disposition Plan: SNF after mental status improved and AKI resolved  Nicholes Hibler, DO [facility] Direct contact: see [email or web address]  7PM-7AM contact night coverage as above 11/25/2020, 4:21 PM  LOS: 0 days

## 2020-11-25 NOTE — Progress Notes (Signed)
Spoke with her son Casimiro Needle. He requested that her MD to call him tomorrow.

## 2020-11-25 NOTE — Plan of Care (Signed)
  Problem: Education: Goal: Knowledge of General Education information will improve Description: Including pain rating scale, medication(s)/side effects and non-pharmacologic comfort measures Outcome: Completed/Met

## 2020-11-25 NOTE — Progress Notes (Signed)
Pt admitted to floor.  Telemetry started.  IV flushed and patent.  Bedside pulse ox ordered.  Skin check completed with charge nurse.  R lateral ankle has stage 2 PI.  Unable to complete admission navigator d/t AMS.

## 2020-11-26 LAB — BASIC METABOLIC PANEL
Anion gap: 9 (ref 5–15)
BUN: 26 mg/dL — ABNORMAL HIGH (ref 8–23)
CO2: 19 mmol/L — ABNORMAL LOW (ref 22–32)
Calcium: 8.8 mg/dL — ABNORMAL LOW (ref 8.9–10.3)
Chloride: 110 mmol/L (ref 98–111)
Creatinine, Ser: 1.15 mg/dL — ABNORMAL HIGH (ref 0.44–1.00)
GFR, Estimated: 48 mL/min — ABNORMAL LOW (ref >60)
Glucose, Bld: 94 mg/dL (ref 70–99)
Potassium: 4.9 mmol/L (ref 3.5–5.1)
Sodium: 138 mmol/L (ref 135–145)

## 2020-11-26 NOTE — Progress Notes (Addendum)
PROGRESS NOTE  [name] UXN:[id number] DOB: [birth year] DOA: 11/23/2020 PCP: Laurann Montana, MD  Brief History   The patient is an 80 yr old woman with a medical history of dementia,CHF, HTN, and seizures (limited to August 2019). She was sent from a nursing facility due to lethargy and confusion more than baseline. She had also been incontinent which was new for her.   In the ED her valproic acid level was found to be low at 27. She was also found to have leukocytosis with WBC of 13. Her creatinine was 1.5 and her BUN was 58. UA was positive for large LE, but no nitrites, no WBC and only rare bacteria. Urine culture is pending.  [facility] were called to admit the patient for further evaluation and treatment. The patient was admitted to observation status. She is receiving IV fluids. Urine culture has had no growth.  PT/OT has been consulted.   Urine culture is polymicrobial and must be repeated. She has been started on IV Rocephin.  The patient is becoming combative and refusing labs. For this reason we have been unable to follow hyperkalemia.  Consultants  . None  Procedures  . None  Antibiotics   Anti-infectives (From admission, onward)   Start     Dose/Rate Route Frequency Ordered Stop   11/25/20 1700  cefTRIAXone (ROCEPHIN) 1 g in sodium chloride 0.9 % 100 mL IVPB        1 g 200 mL/hr over 30 Minutes Intravenous Every 24 hours 11/25/20 1600       Subjective  The patient resting quietly. She is sleeping soundly after being agitated and combative overnight. Not awakened.  Objective   Vitals:  Vitals:   11/26/20 0453 11/26/20 1151  BP: 130/75 98/63  Pulse: 96 65  Resp: 19 16  Temp: 98.1 F (36.7 C) 98.4 F (36.9 C)  SpO2: 95% 97%   Exam:  Constitutional:  . The patient is sleeping soundly after being agitated all night. She is not awakened. Respiratory:  . No increased work of breathing. . No wheezes, rales, or rhonchi . No tactile  fremitus Cardiovascular:  . Regular rate and rhythm . No murmurs, ectopy, or gallups. . No lateral PMI. No thrills. Abdomen:  . Abdomen is soft, non-tender, non-distended . No hernias, masses, or organomegaly . Normoactive bowel sounds.  Musculoskeletal:  . No cyanosis, clubbing, or edema Skin:  . No rashes, lesions, ulcers . palpation of skin: no induration or nodules Neurologic:  . CN 2-12 intact . Movign all extremities. Psychiatric:  . The patient is confused and agitated.   I have personally reviewed the following:   Today's Data  . BMP  Micro Data  . Blood cultures x 2: No growth . Urine culture: polymicrobial, repeated.  Imaging  . CT head: no acute pathology . CXR  Cardiology Data  . EKG  Scheduled Meds: . carvedilol  3.125 mg Oral BID WC  . diclofenac Sodium  4 g Topical BID  . divalproex  125 mg Oral BID  . divalproex  250 mg Oral Q1400  . enoxaparin (LOVENOX) injection  30 mg Subcutaneous Q24H  . feeding supplement  237 mL Oral TID  . levETIRAcetam  250 mg Oral Daily  . megestrol  400 mg Oral BID  . OLANZapine  5 mg Oral QHS  . polyethylene glycol  17 g Oral Daily  . sertraline  50 mg Oral Daily   Continuous Infusions: . cefTRIAXone (ROCEPHIN)  IV 1 g (11/25/20  1816)    Principal Problem:   Acute metabolic encephalopathy Active Problems:   HTN (hypertension)   Chronic combined systolic and diastolic CHF, NYHA class 2 (HCC)   Dementia (HCC)   AKI (acute kidney injury) (HCC)   Seizure disorder (HCC)   Pressure injury of skin   LOS: 1 day   A & P   Acute metabolic encephalopathy in the setting of chronic dementia Secondary to dehydration / AKI. Patient continues to be confused and agitated/combative. Unknown where she is with regard to her baseline.  UTI possibly also playing a role, but UA is equivocal. Urine culture is polymicrobial and needs to be repeated. Blood cultures have had no growth. The patient continues to have a leukocytosis.  Will start Rocephin.  AKI: Appears prerenal. Baseline creatinine appears to be between 0.75 and 1.5. . Creatinine was 1.5 upon presentation. Likely due to dehydration. The patient received a 1 liter bolus in the ED and continues to receive LR at 100 cc/hr. Monitor creatinine, electrolytes, and volume status. Avoid nephrotoxins and hypotension. Creatinine this morning is 1.15. Monitor.  HTN: The patient is a little on the low side/normotensive on Coreg 3.125 mg bid. Lisinopril has been held due to AKI.  Chronic combined CHF: Most recent echocardiogram was from 03/30/2018 demonstrated. Will repeat. Diuretics were held.   Seizure disorder: No seizure activity since August 2019. Cont Keppra (only takes once daily), Depakote, and recheck the valproic acid.  I have seen and examined this patient myself. I have spent 30 minutes in her evaluation and care.  DVT prophylaxis: Lovenox Code Status: Full code - per SNF paperwork Family Communication: No family in room Disposition Plan: Status is: Inpatient  Remains inpatient appropriate because:Persistent severe electrolyte disturbances, Altered mental status and IV treatments appropriate due to intensity of illness or inability to take PO   Dispo: The patient is from: SNF              Anticipated d/c is to: SNF              Anticipated d/c date is: 1 day              Patient currently is not medically stable to d/c.   Difficult to place patient No  Iana Buzan, DO [facility] Direct contact: see [email or web address]  7PM-7AM contact night coverage as above 11/26/2020, 2:42 PM  LOS: 0 days

## 2020-11-26 NOTE — Progress Notes (Signed)
Patient refused lads. Patient got combative. Will try later.

## 2020-11-27 ENCOUNTER — Inpatient Hospital Stay (HOSPITAL_COMMUNITY): Payer: Medicare Other

## 2020-11-27 DIAGNOSIS — R0600 Dyspnea, unspecified: Secondary | ICD-10-CM

## 2020-11-27 LAB — URINE CULTURE

## 2020-11-27 LAB — ECHOCARDIOGRAM COMPLETE

## 2020-11-27 NOTE — Progress Notes (Signed)
Patient ID: Tammy FallingVirginia T Maker, female   DOB: 27-Jan-1941, 80 y.o.   MRN: [id number]  PROGRESS NOTE    Tammy [name]  NWG:[id number] DOB: 27-Jan-1941 DOA: 11/23/2020 PCP: Laurann MontanaWhite, Cynthia, MD   Brief Narrative:  80 year old female with history of dementia, chronic CHF, hypertension, seizures presented from nursing facility due to worsening lethargy and confusion.  On presentation, valproic acid was found to be low at 27; WBC of thirteen, creatinine of 1.5 and UA was positive for large leukocyte esterase but no WBCs and only rare bacteria.  She was started on IV fluids and antibiotics.  Assessment & Plan:   Acute metabolic encephalopathy in the setting of chronic dementia -Possibly secondary to dehydration/acute kidney injury -Patient's mental status has not improved.  Still intermittently agitated with confusion and refusing medications. -Psychiatry evaluation.  Palliative care consultation for goals of care discussion  Patient does not have a UTI -UA on presentation showed large leukocyte esterase but negative for nitrites, only 0-5 WBCs.  This is not UTI.  She was empirically started on Rocephin.  I will DC Rocephin.  Urine culture grew multiple species.  Acute kidney injury -Creatinine 1.5 on presentation.  Creatinine 1.15 on 11/26/2020.  No labs today.  Monitor  Hypertension -Blood pressure currently stable.  Continue Coreg.  Lisinopril on hold due to AKI  Chronic combined CHF -Strict input and output.  Daily weights.  Fluid restriction.  Check 2D echo.  Seizure disorder -No seizure activity since August 2019.  Continue Keppra, Depakote  Generalized conditioning -Overall condition is guarded to poor.  Palliative care consultation   DVT prophylaxis: Lovenox Code Status: Full Family Communication: None at bedside Disposition Plan: Status is: Inpatient  Remains inpatient appropriate because:Inpatient level of care appropriate due to severity of illness   Dispo: The patient is  from: SNF              Anticipated d/c is to: SNF              Anticipated d/c date is: 2 days              Patient currently is not medically stable to d/c.   Difficult to place patient No  Consultants:  will consult psychiatry and palliative care  Procedures: None  Antimicrobials:  Anti-infectives (From admission, onward)   Start     Dose/Rate Route Frequency Ordered Stop   11/25/20 1700  cefTRIAXone (ROCEPHIN) 1 g in sodium chloride 0.9 % 100 mL IVPB        1 g 200 mL/hr over 30 Minutes Intravenous Every 24 hours 11/25/20 1600         Subjective: Patient seen and examined at bedside.  She is awake, hardly participates in any conversation.  Keeps stating "leave me alone".  Patient refused to take a.m. medications as per nursing staff.  No overnight fever or vomiting reported.  Objective: Vitals:   11/26/20 1628 11/26/20 2014 11/27/20 0502 11/27/20 1121  BP: 126/72 128/66 (!) 128/55 124/62  Pulse: 62 83 83 80  Resp:   20 19  Temp:  (!) 97.5 F (36.4 C) 98.3 F (36.8 C) 98.4 F (36.9 C)  TempSrc:  Oral Oral Oral  SpO2:  97% 97% 97%    Intake/Output Summary (Last 24 hours) at 11/27/2020 1123 Last data filed at 11/26/2020 1900 Gross per 24 hour  Intake 360 ml  Output -  Net 360 ml   There were no vitals filed for this visit.  Examination:  General  exam: Appears calm and comfortable.  Looks chronically ill.  Currently on room air. Respiratory system: Bilateral decreased breath sounds at bases Cardiovascular system: S1 & S2 heard, Rate controlled Gastrointestinal system: Abdomen is nondistended, soft and nontender. Normal bowel sounds heard. Extremities: No cyanosis, clubbing, edema  Central nervous system: Awake, hardly participates in any conversation.  No focal neurological deficits. Moving extremities Skin: No rashes, lesions or ulcers Psychiatry: Flat affect.    Data Reviewed: I have personally reviewed following labs and imaging studies  CBC: Recent Labs   Lab 11/23/20 1643 11/24/20 0609 11/25/20 0457  WBC 13.0* 14.1* 13.1*  NEUTROABS 6.3  --  7.0  HGB 12.0 12.0 12.0  HCT 36.7 38.0 36.0  MCV 95.6 96.2 93.3  PLT 275 250 222   Basic Metabolic Panel: Recent Labs  Lab 11/23/20 1643 11/24/20 0609 11/25/20 0457 11/26/20 0902  NA 141 139 140 138  K 4.7 4.4 5.6* 4.9  CL 110 109 110 110  CO2 22 20* 19* 19*  GLUCOSE 94 96 83 94  BUN 58* 42* 23 26*  CREATININE 1.50* 1.17* 1.04* 1.15*  CALCIUM 8.7* 8.9 9.3 8.8*   GFR: CrCl cannot be calculated (Unknown ideal weight.). Liver Function Tests: Recent Labs  Lab 11/23/20 1643  AST 21  ALT 27  ALKPHOS 40  BILITOT 0.7  PROT 6.6  ALBUMIN 3.4*   No results for input(s): LIPASE, AMYLASE in the last 168 hours. Recent Labs  Lab 11/23/20 1644  AMMONIA 38*   Coagulation Profile: No results for input(s): INR, PROTIME in the last 168 hours. Cardiac Enzymes: No results for input(s): CKTOTAL, CKMB, CKMBINDEX, TROPONINI in the last 168 hours. BNP (last 3 results) No results for input(s): PROBNP in the last 8760 hours. HbA1C: No results for input(s): HGBA1C in the last 72 hours. CBG: Recent Labs  Lab 11/23/20 1636  GLUCAP 84   Lipid Profile: No results for input(s): CHOL, HDL, LDLCALC, TRIG, CHOLHDL, LDLDIRECT in the last 72 hours. Thyroid Function Tests: No results for input(s): TSH, T4TOTAL, FREET4, T3FREE, THYROIDAB in the last 72 hours. Anemia Panel: No results for input(s): VITAMINB12, FOLATE, FERRITIN, TIBC, IRON, RETICCTPCT in the last 72 hours. Sepsis Labs: Recent Labs  Lab 11/24/20 0021  LATICACIDVEN 1.2    Recent Results (from the past 240 hour(s))  Blood Cultures (routine x 2)     Status: None (Preliminary result)   Collection Time: 11/23/20  4:33 PM   Specimen: BLOOD  Result Value Ref Range Status   Specimen Description BLOOD SITE NOT SPECIFIED  Final   Special Requests   Final    BOTTLES DRAWN AEROBIC AND ANAEROBIC Blood Culture results may not be optimal  due to an inadequate volume of blood received in culture bottles   Culture   Final    NO GROWTH 4 DAYS Performed at [facility], [address], [place], [place] [address]    Report Status PENDING  Incomplete  Resp Panel by RT-PCR (Flu A&B, Covid) Nasopharyngeal Swab     Status: None   Collection Time: 11/23/20  4:33 PM   Specimen: Nasopharyngeal Swab; Nasopharyngeal(NP) swabs in vial transport medium  Result Value Ref Range Status   SARS Coronavirus 2 by RT PCR NEGATIVE NEGATIVE Final    Comment: (NOTE) SARS-CoV-2 target nucleic acids are NOT DETECTED.  The SARS-CoV-2 RNA is generally detectable in upper respiratory specimens during the acute phase of infection. The lowest concentration of SARS-CoV-2 viral copies this assay can detect is 138 copies/mL.  A negative result does not preclude SARS-Cov-2 infection and should not be used as the sole basis for treatment or other patient management decisions. A negative result may occur with  improper specimen collection/handling, submission of specimen other than nasopharyngeal swab, presence of viral mutation(s) within the areas targeted by this assay, and inadequate number of viral copies(<138 copies/mL). A negative result must be combined with clinical observations, patient history, and epidemiological information. The expected result is Negative.  Fact Sheet for Patients:  BloggerCourse.com  Fact Sheet for Healthcare Providers:  SeriousBroker.it  This test is no t yet approved or cleared by the [place] FDA and  has been authorized for detection and/or diagnosis of SARS-CoV-2 by FDA under an Emergency Use Authorization (EUA). This EUA will remain  in effect (meaning this test can be used) for the duration of the COVID-19 declaration under Section 564(b)(1) of the Act, 21 U.S.C.section 360bbb-3(b)(1), unless the authorization is terminated  or revoked sooner.        Influenza A by PCR NEGATIVE NEGATIVE Final   Influenza B by PCR NEGATIVE NEGATIVE Final    Comment: (NOTE) The Xpert Xpress SARS-CoV-2/FLU/RSV plus assay is intended as an aid in the diagnosis of influenza from Nasopharyngeal swab specimens and should not be used as a sole basis for treatment. Nasal washings and aspirates are unacceptable for Xpert Xpress SARS-CoV-2/FLU/RSV testing.  Fact Sheet for Patients: BloggerCourse.com  Fact Sheet for Healthcare Providers: SeriousBroker.it  This test is not yet approved or cleared by the [place] FDA and has been authorized for detection and/or diagnosis of SARS-CoV-2 by FDA under an Emergency Use Authorization (EUA). This EUA will remain in effect (meaning this test can be used) for the duration of the COVID-19 declaration under Section 564(b)(1) of the Act, 21 U.S.C. section 360bbb-3(b)(1), unless the authorization is terminated or revoked.  Performed at [facility], [address] [place], [place] [address]   Blood Cultures (routine x 2)     Status: None (Preliminary result)   Collection Time: 11/23/20  4:44 PM   Specimen: BLOOD  Result Value Ref Range Status   Specimen Description BLOOD SITE NOT SPECIFIED  Final   Special Requests   Final    BOTTLES DRAWN AEROBIC AND ANAEROBIC Blood Culture adequate volume   Culture   Final    NO GROWTH 4 DAYS Performed at [facility], [address], [place], [place] [address]    Report Status PENDING  Incomplete  Urine culture     Status: Abnormal   Collection Time: 11/24/20 12:21 AM   Specimen: Urine, Random  Result Value Ref Range Status   Specimen Description URINE, RANDOM  Final   Special Requests   Final    NONE Performed at [facility], [address], [place], [place] [address]    Culture MULTIPLE SPECIES PRESENT, SUGGEST RECOLLECTION (A)  Final   Report Status 11/25/2020 FINAL  Final  Culture, Urine      Status: Abnormal   Collection Time: 11/25/20  9:15 PM   Specimen: Urine, Random  Result Value Ref Range Status   Specimen Description URINE, RANDOM  Final   Special Requests   Final    NONE Performed at [facility], [address], [place], [place] [address]    Culture MULTIPLE SPECIES PRESENT, SUGGEST RECOLLECTION (A)  Final   Report Status 11/27/2020 FINAL  Final         Radiology Studies: No results found.  Scheduled Meds: . carvedilol  3.125 mg Oral BID WC  . diclofenac Sodium  4 g Topical BID  . divalproex  125 mg Oral BID  . divalproex  250 mg Oral Q1400  . enoxaparin (LOVENOX) injection  30 mg Subcutaneous Q24H  . feeding supplement  237 mL Oral TID  . levETIRAcetam  250 mg Oral Daily  . megestrol  400 mg Oral BID  . OLANZapine  5 mg Oral QHS  . polyethylene glycol  17 g Oral Daily  . sertraline  50 mg Oral Daily   Continuous Infusions: . cefTRIAXone (ROCEPHIN)  IV 1 g (11/26/20 1635)          Glade Lloyd, MD [facility] 11/27/2020, 11:23 AM

## 2020-11-27 NOTE — Progress Notes (Signed)
Patient refused to take AM scheduled medications, MD has been notified, RN will continue to monitor this patient.

## 2020-11-27 NOTE — Progress Notes (Incomplete)
  Echocardiogram 2D Echocardiogram has been performed.  Delcie Roch 11/27/2020, 4:17 PM

## 2020-11-27 NOTE — TOC Initial Note (Addendum)
Transition of Care [facility]) - Initial/Assessment Note    Patient Details  Name: Tammy [name] MRN: [id number] Date of Birth: Feb 18, 1941  Transition of Care [facility]) CM/SW Contact:    Cristobal Goldmann, LCSW Phone Number: 11/27/2020, 3:41 PM  Clinical Narrative:  Talked with son, Tammy [name] ([phone]) by phone regarding his mother and her discharge plan. Son confirmed that his mother is from [facility] and has been there about 3 years. Per son, his mother will return to [place] at discharge. When asked, Mr. Bittinger thinks his mother has been vaccinated at the facility but is not 100% sure.                 Expected Discharge Plan: Skilled Nursing Facility [facility]) Barriers to Discharge: Continued Medical Work up   Patient Goals and CMS Choice Patient states their goals for this hospitalization and ongoing recovery are:: Per son, patient LTC resident at [facility] and will return there at discharge CMS Medicare.gov Compare Post Acute Care list provided to:: Other (Comment Required) (Not needed as per son, patient will return to [facility]) Choice offered to / list presented to : NA  Expected Discharge Plan and Services Expected Discharge Plan: Skilled Nursing Facility [facility] [place]) In-house Referral: Clinical Social Work   Post Acute Care Choice: Nursing Home Living arrangements for the past 2 months: Skilled Nursing Facility [facility] [place])                                     Prior Living Arrangements/Services Living arrangements for the past 2 months: Skilled Nursing Facility ([place]) Lives with:: Facility Resident Patient language and need for interpreter reviewed:: No Do you feel safe going back to the place where you live?: Yes (Son agreeable to patient returning to [place] at discharge)      Need for Family Participation in Patient Care: Yes (Comment) Care giver support system in place?: Yes (comment) (Patient lives at [facility])    Criminal Activity/Legal Involvement Pertinent to Current Situation/Hospitalization: No - Comment as needed  Activities of Daily Living      Permission Sought/Granted Permission sought to share information with : Family Supports (Patient oriented to self only, called son as he is primary contact) Permission granted to share information with : No (Patient oriented to self only)              Emotional Assessment Appearance:: Other (Comment Required (Did not visit with patient) Attitude/Demeanor/Rapport: Unable to Assess (Patient oriented to self only) Affect (typically observed): Unable to Assess (Patient oriented to self only) Orientation: : Oriented to Self Alcohol / Substance Use: Tobacco Use,Alcohol Use,Illicit Drugs (Per H&P, patient reports that she smokes cigarettes and does not drink alcohol or use illicit drugs) Psych Involvement: No (comment)  Admission diagnosis:  AKI (acute kidney injury) (HCC) [N17.9] Altered mental status, unspecified altered mental status type [R41.82] Acute metabolic encephalopathy [G93.41] Patient Active Problem List   Diagnosis Date Noted  . Pressure injury of skin 11/25/2020  . Adult failure to thrive   . Palliative care by specialist   . DNR (do not resuscitate)   . Weakness generalized   . Respiratory failure (HCC)   . Seizure (HCC)   . Seizure disorder (HCC) 06/04/2018  . Status epilepticus (HCC)   . Acute metabolic encephalopathy   . Acute hypercapnic respiratory failure (HCC) 04/01/2018  . Acute CHF (congestive heart  failure) (HCC) 03/29/2018  . CKD (chronic kidney disease) stage 2, GFR 60-89 ml/min 03/29/2018  . Complicated UTI (urinary tract infection)   . Acute confusional state 09/17/2016  . Rhabdomyolysis 07/02/2016  . Fall at home 07/01/2016  . Lactic acid acidosis 12/01/2015  . Metabolic acidosis 12/01/2015  . Protein-calorie malnutrition, severe (HCC) 12/01/2015  . AKI (acute kidney injury) (HCC)   . Hyponatremia   .  Hypothermia   . Tachycardia   . Hyperkalemia 11/29/2015  . Acute renal failure (HCC) 11/29/2015  . Chronic combined systolic and diastolic CHF, 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/2015  . Mitral regurgitation 11/29/2015  . Elevated lactic acid level 11/29/2015  . Agitation 11/29/2015  . COPD (chronic obstructive pulmonary disease) (HCC) 11/29/2015  . Dementia (HCC) 11/29/2015  . Confusion   . Acute blood loss anemia 08/21/2015  . Postoperative anemia due to acute blood loss 08/20/2015  . Displaced intertrochanteric fracture of left femur (HCC) 08/19/2015  . Closed left hip fracture (HCC) 08/18/2015  . Secondary cardiomyopathy (HCC)   . Acute systolic congestive heart failure, NYHA class 3 (HCC) 03/02/2015  . COPD exacerbation (HCC) 03/02/2015  . Tobacco abuse 03/02/2015  . Severe protein-calorie malnutrition (HCC) 03/02/2015  . HTN (hypertension) 03/02/2015  . Congestive dilated cardiomyopathy (HCC)   . CVA (cerebral infarction) 02/28/2015  . Stroke [facility])    PCP:  Laurann Montana, MD Pharmacy:  No Pharmacies Listed   Social Determinants of Health (SDOH) Interventions  No SDOH interventions requested or needed at this time.  Readmission Risk Interventions No flowsheet data found.

## 2020-11-28 DIAGNOSIS — Z66 Do not resuscitate: Secondary | ICD-10-CM

## 2020-11-28 DIAGNOSIS — Z515 Encounter for palliative care: Secondary | ICD-10-CM

## 2020-11-28 LAB — CULTURE, BLOOD (ROUTINE X 2)
Culture: NO GROWTH
Culture: NO GROWTH
Special Requests: ADEQUATE

## 2020-11-28 NOTE — Progress Notes (Signed)
Patient refused assessment of right ankle, RN was unable to obtain measurements of current pressure injury that has been previously charted.

## 2020-11-28 NOTE — Progress Notes (Addendum)
Lab technician informed RN that patient refused morning lab draws, MD and the son Shreena Baines have been notified and RN will continue to monitor this patient.

## 2020-11-28 NOTE — Consult Note (Signed)
Consultation Note Date: 11/28/2020   Patient Name: Tammy [name]  DOB: 1941/09/05  MRN: [id number]  Age / Sex: 80 y.o., female  PCP: Tammy Montana, MD Referring Physician: Glade Lloyd, MD  Reason for Consultation: Establishing goals of care  HPI/Patient Profile: 80 y.o. female  with past medical history of dementia, diastolic heart failure, seizures who was admitted from [facility] on 11/23/2020 with lethargy and confusion.  She was found to have a below therapeutic Depakote level, AKI and a possible UTI (microbiology grew multiple species).  In the hospital patient has been eating relatively well but has been refusing medications and having difficulty with agitation.  Clinical Assessment and Goals of Care:  I have reviewed medical records including EPIC notes, labs and imaging, received report from the care team, examined the patient and spoke on the phone with Tammy [name] (son and POA) to discuss diagnosis prognosis, GOC, EOL wishes, disposition and options.  I introduced Palliative Medicine as specialized medical care for people living with serious illness. It focuses on providing relief from the symptoms and stress of a serious illness.   [name] is sitting comfortably in bed eating lunch.  She responds appropriately with short answer to all of my questions.  Says she has no pain with the exception of her right ankle which is very tender to the touch.  She says she does pretty well at [facility] and knows several people out there.  She tells me that her son Tammy [name] handles everything for her.  I spoke with Tammy [name] on the phone.  We discussed a brief life review of the patient. When she started to decline Tammy [name] took her into his home.  Eventually she needed skilled level care.  Tammy [name] says it was the hardest decision of his life to put her in a facility.  Tammy [name] is close to his home so he  sees her often.  As far as functional and nutritional status "she eats what she wants to eat"  Tammy [name] states he will bring her what she wants from outside of the facility.  She is unable to walk, but was able to be in a wheelchair.  She's had right ankle pain for a long time since she had surgery there.   We discussed her current illness and what it means in the larger context of her on-going co-morbidities.  Natural disease trajectory and expectations at EOL were discussed.  I attempted to elicit values and goals of care important to the patient.  The difference between aggressive medical intervention and comfort care was considered in light of the patient's goals of care.   Advanced directives, concepts specific to code status, were considered and discussed.  Tammy [name] states that he and his mother have talked about her goals of care.  If she suffered cardiac or respiratory arrest she would not want to be resuscitated.  However he does want her well cared for an appropriately treated if she has not arrested.  Questions and concerns were addressed.  The family was  encouraged to call with questions or concerns.   Primary Decision Maker:  HCPOA  Son, Tammy [name]    SUMMARY OF RECOMMENDATIONS    Code status changed to DNR.  Tammy [name] form placed on chart. Per Tammy [name] patient is more agitated and difficult in the morning.  In the early afternoon she is very agreeable.  This is her baseline. Recommend Palliative Care to follow at [facility].   Please put in DC instructions "Palliative Care to follow at [facility]"  Code Status/Advance Care Planning: DNR  Symptom Management:  Per primary  Additional Recommendations (Limitations, Scope, Preferences):  Full Scope Treatment  Palliative Prophylaxis:   Delirium Protocol  Psycho-social/Spiritual:  Desire for further Chaplaincy support:  Not discussed.  Prognosis:  Unable to determine     Discharge Planning: Return to long term care  with Palliative to follow      Primary Diagnoses: Present on Admission: . AKI (acute kidney injury) (HCC) . Chronic combined systolic and diastolic CHF, NYHA class 2 (HCC) . Acute metabolic encephalopathy . Dementia (HCC) . HTN (hypertension)   I have reviewed the medical record, interviewed the patient and family, and examined the patient. The following aspects are pertinent.  Past Medical History:  Diagnosis Date  . CHF (congestive heart failure) (HCC)   . CKD (chronic kidney disease)   . COPD (chronic obstructive pulmonary disease) (HCC)   . Dementia (HCC)   . Hypertension   . Insomnia    Social History   Socioeconomic History  . Marital status: Single    Spouse name: Not on file  . Number of children: Not on file  . Years of education: Not on file  . Highest education level: Not on file  Occupational History  . Occupation: retired  Tobacco Use  . Smoking status: Current Every Day Smoker    Packs/day: 2.00    Years: 50.00    Pack years: 100.00    Types: Cigarettes  . Smokeless tobacco: Never Used  Vaping Use  . Vaping Use: Never used  Substance and Sexual Activity  . Alcohol use: No  . Drug use: No  . Sexual activity: Not on file  Other Topics Concern  . Not on file  Social History Narrative  . Not on file   Social Determinants of Health   Financial Resource Strain: Not on file  Food Insecurity: Not on file  Transportation Needs: Not on file  Physical Activity: Not on file  Stress: Not on file  Social Connections: Not on file   Family History  Problem Relation Age of Onset  . Congestive Heart Failure Son 55    No Known Allergies    Vital Signs: BP 138/66 (BP Location: Left Arm)   Pulse 100   Temp 98.1 F (36.7 C) (Oral)   Resp 18   SpO2 98%  Pain Scale: 0-10   Pain Score: 0-No pain   SpO2: SpO2: 98 % O2 Device:SpO2: 98 % O2 Flow Rate: .O2 Flow Rate (L/min): 0 L/min    Palliative Assessment/Data: 40%     Time In: 4:00 Time  Out: 5:00 Time Total: 60 min Visit consisted of counseling and education dealing with the complex and emotionally intense issues surrounding the need for palliative care and symptom management in the setting of serious and potentially life-threatening illness. Greater than 50%  of this time was spent counseling and coordinating care related to the above assessment and plan.  Signed by: Norvel Richards, PA-C Palliative Medicine  Please contact Palliative  Medicine Team phone at [phone] for questions and concerns.  For individual provider: See Shea Evans

## 2020-11-28 NOTE — Progress Notes (Signed)
Patient refused scheduled AM medications and vital sign checks, RN will continue to monitor this patient.

## 2020-11-28 NOTE — Consult Note (Signed)
  80 year old female with history of dementia, chronic CHF, hypertension, seizures presented from nursing facility due to worsening lethargy and confusion.  On presentation, valproic acid was found to be low at 27; WBC of thirteen, creatinine of 1.5 and UA was positive for large leukocyte esterase but no WBCs and only rare bacteria.  She was started on IV fluids and antibiotics.  Psych consult placed for agitation. Patient is seen and attempted to assess. Patient declined psychiatric evaluation. Patient at baseline appears to have diagnosis of dementia, who presented with worsening lethargy and confusion. It is likely patient has developed delirium in the setting of dementia, likely secondary to underlying infection.   On evaluation patient is appeared to be in a deep slumber, however is able to follow commands by opening her eyes. She is observed to be lying in bed, unrestrained, and does not appear to be exhibiting any overt aggression, agitation, and or irritability. She has been started on olanzapine 5 mg p.o. nightly, Zoloft 50 mg p.o. daily, Depakote 125 mg p.o. twice daily and Depakote 250 mg p.o. daily. Last valproic acid level obtained on admission was 27. Will repeat at this time.  Patient does not appear to be an imminent risk to self or others. She does not appear to be exhibiting any agitation, aggression, or irritability. Patient will be expected to have intermittent periods of confusion, as she has a diagnosis of dementia. She has also refused medications, recommend continuing to try to administer those throughout the day as she appears to be oppositional.   Patient's current presentation very consistent with a metabolic encephalopathy/delirium related to infection and AKI.  Cognitive improvement will likely lag behind improvement in lab work.   -Patient urinalysis showed large leukocyte Estrace, but negative for nitrites. Although patient does not have clinical findings suggestive of urinary  tract infection, the smallest amount of bacteria/infection in geriatric patients can lead to confusion, lethargy, agitation, and paranoia. This is not uncommon finding, will recommend continuing treatment to see if she improves.   -Will continue current medications to include olanzapine 5 mg as this is appropriate management for dementia with behavioral disturbances.  -Psychiatry to sign off at this time.

## 2020-11-28 NOTE — Progress Notes (Addendum)
Patient ID: Tammy [name], female   DOB: Aug 12, 1941, 80 y.o.   MRN: [id number]  PROGRESS NOTE    Tammy [name]  CBS:[id number] DOB: Feb 04, 1941 DOA: 11/23/2020 PCP: Laurann Montana, MD   Brief Narrative:  80 year old female with history of dementia, chronic CHF, hypertension, seizures presented from nursing facility due to worsening lethargy and confusion.  On presentation, valproic acid was found to be low at 27; WBC of thirteen, creatinine of 1.5 and UA was positive for large leukocyte esterase but no WBCs and only rare bacteria.  She was started on IV fluids and antibiotics.  Assessment & Plan:   Acute metabolic encephalopathy in the setting of chronic dementia -Possibly secondary to dehydration/acute kidney injury -Patient's mental status has not improved.  Still intermittently agitated with confusion and refusing medications. -Psychiatry evaluation is pending.  Palliative care consultation for goals of care discussion is also pending.  Patient does not have a UTI -UA on presentation showed large leukocyte esterase but negative for nitrites, only 0-5 WBCs.  This is not UTI.  She was empirically started on Rocephin which was discontinued on 11/27/2020.  Urine culture grew multiple species.  Acute kidney injury -Creatinine 1.5 on presentation.  Creatinine 1.15 on 11/26/2020.  No labs today.  Monitor.  Patient has been intermittently refusing labs  Hypertension -Blood pressure currently stable.  Continue Coreg.  Lisinopril on hold due to AKI  Chronic combined CHF -Strict input and output.  Daily weights.  Fluid restriction.  Echo showed EF of 60 to 65%.  Seizure disorder -No seizure activity since August 2019.  Continue Keppra, Depakote  Generalized conditioning -Overall condition is guarded to poor.  Palliative care consultation   DVT prophylaxis: Lovenox Code Status: Full Family Communication: None at bedside Disposition Plan: Status is: Inpatient  Remains inpatient  appropriate because:Inpatient level of care appropriate due to severity of illness   Dispo: The patient is from: SNF              Anticipated d/c is to: SNF              Anticipated d/c date is: 2 days              Patient currently is not medically stable to d/c.   Difficult to place patient No  Consultants:  psychiatry and palliative care  Procedures: Echo  Antimicrobials:  Anti-infectives (From admission, onward)   Start     Dose/Rate Route Frequency Ordered Stop   11/25/20 1700  cefTRIAXone (ROCEPHIN) 1 g in sodium chloride 0.9 % 100 mL IVPB        1 g 200 mL/hr over 30 Minutes Intravenous Every 24 hours 11/25/20 1600         Subjective: Patient seen and examined at bedside.  No overnight fever, vomiting or agitation reported by nursing staff.  Intermittently refusing medications and labs including this morning's labs: as per nursing staff Objective: Vitals:   11/27/20 1737 11/27/20 1934 11/28/20 0135 11/28/20 0434  BP: 130/62 (!) 107/58 (!) 154/56 138/66  Pulse: 80 90 96 100  Resp: 18  20 18   Temp: 98.6 F (37 C) 98.2 F (36.8 C) 99.3 F (37.4 C) 98.1 F (36.7 C)  TempSrc: Oral Oral Oral Oral  SpO2: 97% 100% 99% 98%    Intake/Output Summary (Last 24 hours) at 11/28/2020 0732 Last data filed at 11/27/2020 1739 Gross per 24 hour  Intake 360 ml  Output --  Net 360 ml   There were no vitals  filed for this visit.  Examination:  General exam: No acute distress.  Looks chronically ill.  Currently still on room air.  Extremely poor historian.  Wakes up slightly; hardly participates in any conversation. Respiratory system: Decreased breath sounds at bases bilaterally, no wheezing cardiovascular system: Rate controlled, S1-S2 heard Gastrointestinal system: Abdomen is nondistended, soft and nontender.  Bowel sounds are heard  extremities: No edema, cyanosis   Data Reviewed: I have personally reviewed following labs and imaging studies  CBC: Recent Labs  Lab  11/23/20 1643 11/24/20 0609 11/25/20 0457  WBC 13.0* 14.1* 13.1*  NEUTROABS 6.3  --  7.0  HGB 12.0 12.0 12.0  HCT 36.7 38.0 36.0  MCV 95.6 96.2 93.3  PLT 275 250 222   Basic Metabolic Panel: Recent Labs  Lab 11/23/20 1643 11/24/20 0609 11/25/20 0457 11/26/20 0902  NA 141 139 140 138  K 4.7 4.4 5.6* 4.9  CL 110 109 110 110  CO2 22 20* 19* 19*  GLUCOSE 94 96 83 94  BUN 58* 42* 23 26*  CREATININE 1.50* 1.17* 1.04* 1.15*  CALCIUM 8.7* 8.9 9.3 8.8*   GFR: CrCl cannot be calculated (Unknown ideal weight.). Liver Function Tests: Recent Labs  Lab 11/23/20 1643  AST 21  ALT 27  ALKPHOS 40  BILITOT 0.7  PROT 6.6  ALBUMIN 3.4*   No results for input(s): LIPASE, AMYLASE in the last 168 hours. Recent Labs  Lab 11/23/20 1644  AMMONIA 38*   Coagulation Profile: No results for input(s): INR, PROTIME in the last 168 hours. Cardiac Enzymes: No results for input(s): CKTOTAL, CKMB, CKMBINDEX, TROPONINI in the last 168 hours. BNP (last 3 results) No results for input(s): PROBNP in the last 8760 hours. HbA1C: No results for input(s): HGBA1C in the last 72 hours. CBG: Recent Labs  Lab 11/23/20 1636  GLUCAP 84   Lipid Profile: No results for input(s): CHOL, HDL, LDLCALC, TRIG, CHOLHDL, LDLDIRECT in the last 72 hours. Thyroid Function Tests: No results for input(s): TSH, T4TOTAL, FREET4, T3FREE, THYROIDAB in the last 72 hours. Anemia Panel: No results for input(s): VITAMINB12, FOLATE, FERRITIN, TIBC, IRON, RETICCTPCT in the last 72 hours. Sepsis Labs: Recent Labs  Lab 11/24/20 0021  LATICACIDVEN 1.2    Recent Results (from the past 240 hour(s))  Blood Cultures (routine x 2)     Status: None   Collection Time: 11/23/20  4:33 PM   Specimen: BLOOD  Result Value Ref Range Status   Specimen Description BLOOD SITE NOT SPECIFIED  Final   Special Requests   Final    BOTTLES DRAWN AEROBIC AND ANAEROBIC Blood Culture results may not be optimal due to an inadequate volume  of blood received in culture bottles   Culture   Final    NO GROWTH 5 DAYS Performed at [facility], [address] [place], [place] [address]    Report Status 11/28/2020 FINAL  Final  Resp Panel by RT-PCR (Flu A&B, Covid) Nasopharyngeal Swab     Status: None   Collection Time: 11/23/20  4:33 PM   Specimen: Nasopharyngeal Swab; Nasopharyngeal(NP) swabs in vial transport medium  Result Value Ref Range Status   SARS Coronavirus 2 by RT PCR NEGATIVE NEGATIVE Final    Comment: (NOTE) SARS-CoV-2 target nucleic acids are NOT DETECTED.  The SARS-CoV-2 RNA is generally detectable in upper respiratory specimens during the acute phase of infection. The lowest concentration of SARS-CoV-2 viral copies this assay can detect is 138 copies/mL. A negative result does not preclude  SARS-Cov-2 infection and should not be used as the sole basis for treatment or other patient management decisions. A negative result may occur with  improper specimen collection/handling, submission of specimen other than nasopharyngeal swab, presence of viral mutation(s) within the areas targeted by this assay, and inadequate number of viral copies(<138 copies/mL). A negative result must be combined with clinical observations, patient history, and epidemiological information. The expected result is Negative.  Fact Sheet for Patients:  BloggerCourse.comhttps://www.fda.gov/media/152166/download  Fact Sheet for Healthcare Providers:  SeriousBroker.ithttps://www.fda.gov/media/152162/download  This test is no t yet approved or cleared by the [place] FDA and  has been authorized for detection and/or diagnosis of SARS-CoV-2 by FDA under an Emergency Use Authorization (EUA). This EUA will remain  in effect (meaning this test can be used) for the duration of the COVID-19 declaration under Section 564(b)(1) of the Act, 21 U.S.C.section 360bbb-3(b)(1), unless the authorization is terminated  or revoked sooner.       Influenza A by PCR  NEGATIVE NEGATIVE Final   Influenza B by PCR NEGATIVE NEGATIVE Final    Comment: (NOTE) The Xpert Xpress SARS-CoV-2/FLU/RSV plus assay is intended as an aid in the diagnosis of influenza from Nasopharyngeal swab specimens and should not be used as a sole basis for treatment. Nasal washings and aspirates are unacceptable for Xpert Xpress SARS-CoV-2/FLU/RSV testing.  Fact Sheet for Patients: BloggerCourse.comhttps://www.fda.gov/media/152166/download  Fact Sheet for Healthcare Providers: SeriousBroker.ithttps://www.fda.gov/media/152162/download  This test is not yet approved or cleared by the [place] FDA and has been authorized for detection and/or diagnosis of SARS-CoV-2 by FDA under an Emergency Use Authorization (EUA). This EUA will remain in effect (meaning this test can be used) for the duration of the COVID-19 declaration under Section 564(b)(1) of the Act, 21 U.S.C. section 360bbb-3(b)(1), unless the authorization is terminated or revoked.  Performed at [facility], [address] [place], [place] [address]   Blood Cultures (routine x 2)     Status: None   Collection Time: 11/23/20  4:44 PM   Specimen: BLOOD  Result Value Ref Range Status   Specimen Description BLOOD SITE NOT SPECIFIED  Final   Special Requests   Final    BOTTLES DRAWN AEROBIC AND ANAEROBIC Blood Culture adequate volume   Culture   Final    NO GROWTH 5 DAYS Performed at [facility], [address] [place], [place] [address]    Report Status 11/28/2020 FINAL  Final  Urine culture     Status: Abnormal   Collection Time: 11/24/20 12:21 AM   Specimen: Urine, Random  Result Value Ref Range Status   Specimen Description URINE, RANDOM  Final   Special Requests   Final    NONE Performed at [facility], [address] [place], [place] [address]    Culture MULTIPLE SPECIES PRESENT, SUGGEST RECOLLECTION (A)  Final   Report Status 11/25/2020 FINAL  Final  Culture, Urine     Status: Abnormal   Collection Time:  11/25/20  9:15 PM   Specimen: Urine, Random  Result Value Ref Range Status   Specimen Description URINE, RANDOM  Final   Special Requests   Final    NONE Performed at [facility], [address] [place], [place] [address]    Culture MULTIPLE SPECIES PRESENT, SUGGEST RECOLLECTION (A)  Final   Report Status 11/27/2020 FINAL  Final         Radiology Studies: ECHOCARDIOGRAM COMPLETE  Result Date: 11/27/2020    ECHOCARDIOGRAM REPORT  Patient Name:   Tammy [name] Date of Exam: 11/27/2020 Medical Rec #:  [id number]         Height:       63.0 in Accession #:    [id number]        Weight:       111.0 lb Date of Birth:  01/21/1941         BSA:          1.506 m Patient Age:    80 years          BP:           124/62 mmHg Patient Gender: F                 HR:           105 bpm. Exam Location:  Inpatient Procedure: 2D Echo Indications:    dyspnea  History:        Patient has prior history of Echocardiogram examinations, most                 recent 03/30/2018. CHF; Risk Factors:Hypertension and Current                 Smoker.  Sonographer:    Delcie Roch Referring Phys: [id number] [facility]  Sonographer Comments: Technically difficult study due to poor echo windows. Image acquisition challenging due to uncooperative patient and Image acquisition challenging due to respiratory motion. IMPRESSIONS  1. Left ventricular ejection fraction, by estimation, is 60 to 65%. The left ventricle has normal function. The left ventricle has no regional wall motion abnormalities. There is not well visualized left ventricular hypertrophy. Left ventricular diastolic function could not be evaluated.  2. Right ventricular systolic function is normal. The right ventricular size is normal.  3. The mitral valve is grossly normal. Trivial mitral valve regurgitation. No evidence of mitral stenosis.  4. The aortic valve was not well visualized. Aortic valve regurgitation is not visualized.  5. The inferior vena cava is  normal in size with greater than 50% respiratory variability, suggesting right atrial pressure of 3 mmHg. Comparison(s): Changes from prior study are noted. Conclusion(s)/Recommendation(s): Technically challening study due to limited acoustic windows and patient cooperation. Grossly normal LVEF, improved from prior. No focal wall motion abnormalities noted but reduced sensitivity for this based on available images. FINDINGS  Left Ventricle: Left ventricular ejection fraction, by estimation, is 60 to 65%. The left ventricle has normal function. The left ventricle has no regional wall motion abnormalities. The left ventricular internal cavity size was normal in size. There is  not well visualized left ventricular hypertrophy. Left ventricular diastolic function could not be evaluated. Right Ventricle: The right ventricular size is normal. Right vetricular wall thickness was not well visualized. Right ventricular systolic function is normal. Left Atrium: Left atrial size was not well visualized. Right Atrium: Right atrial size was not well visualized. Pericardium: The pericardium was not well visualized. Mitral Valve: The mitral valve is grossly normal. Trivial mitral valve regurgitation. No evidence of mitral valve stenosis. Tricuspid Valve: The tricuspid valve is grossly normal. Tricuspid valve regurgitation is trivial. No evidence of tricuspid stenosis. Aortic Valve: The aortic valve was not well visualized. Aortic valve regurgitation is not visualized. Pulmonic Valve: The pulmonic valve was not well visualized. Pulmonic valve regurgitation is not visualized. Aorta: The ascending aorta was not well visualized and the aortic root was not well visualized. Venous: The inferior vena cava is normal in size with greater than 50%  respiratory variability, suggesting right atrial pressure of 3 mmHg. IAS/Shunts: The interatrial septum was not well visualized.   Diastology LV e' medial:    5.77 cm/s LV E/e' medial:  12.1 LV e'  lateral:   5.98 cm/s LV E/e' lateral: 11.7  RIGHT VENTRICLE             IVC RV S prime:     14.50 cm/s  IVC diam: 1.50 cm TAPSE (M-mode): 1.6 cm LEFT ATRIUM           Index       RIGHT ATRIUM          Index LA Vol (A4C): 28.6 ml 19.00 ml/m RA Area:     7.07 cm                                   RA Volume:   12.40 ml 8.24 ml/m  AORTIC VALVE LVOT Vmax:   138.00 cm/s LVOT Vmean:  89.300 cm/s LVOT VTI:    0.220 m MV E velocity: 69.80 cm/s MV A velocity: 146.00 cm/s  SHUNTS MV E/A ratio:  0.48         Systemic VTI: 0.22 m Jodelle Red MD Electronically signed by Jodelle Red MD Signature Date/Time: 11/27/2020/8:12:57 PM    Final         Scheduled Meds: . carvedilol  3.125 mg Oral BID WC  . diclofenac Sodium  4 g Topical BID  . divalproex  125 mg Oral BID  . divalproex  250 mg Oral Q1400  . enoxaparin (LOVENOX) injection  30 mg Subcutaneous Q24H  . feeding supplement  237 mL Oral TID  . levETIRAcetam  250 mg Oral Daily  . megestrol  400 mg Oral BID  . OLANZapine  5 mg Oral QHS  . polyethylene glycol  17 g Oral Daily  . sertraline  50 mg Oral Daily   Continuous Infusions: . cefTRIAXone (ROCEPHIN)  IV 1 g (11/27/20 2114)          Glade Lloyd, MD [facility] 11/28/2020, 7:32 AM

## 2020-11-29 LAB — SARS CORONAVIRUS 2 (TAT 6-24 HRS): SARS Coronavirus 2: NEGATIVE

## 2020-11-29 LAB — VALPROIC ACID LEVEL: Valproic Acid Lvl: 47 ug/mL — ABNORMAL LOW (ref 50.0–100.0)

## 2020-11-29 MED ORDER — LEVETIRACETAM 250 MG PO TABS
250.0000 mg | ORAL_TABLET | Freq: Every morning | ORAL | Status: AC
Start: 2020-11-29 — End: ?

## 2020-11-29 MED ORDER — OLANZAPINE 5 MG PO TABS: 5.0000 mg | ORAL_TABLET | Freq: Every day | ORAL | 0 refills | Status: DC

## 2020-11-29 NOTE — Care Management Important Message (Signed)
Important Message  Patient Details  Name: [name] MRN: [id number] Date of Birth: 04/20/1941   Medicare Important Message Given:  Yes - Important Message mailed due to current National Emergency   Verbal consent obtained due to current National Emergency  Relationship to patient: Child Contact Name: Casimiro Needle Call Date: 11/29/20  Time: 1148 Phone: [phone] Outcome: No Answer/Busy Important Message mailed to: Patient address on file    Orson Aloe 11/29/2020, 11:48 AM

## 2020-11-29 NOTE — Discharge Summary (Signed)
Physician Discharge Summary  ZERIYAH WAIN LKT:[id number] DOB: May 03, 1941 DOA: 11/23/2020  PCP: Laurann Montana, MD  Admit date: 11/23/2020 Discharge date: 11/29/2020  Admitted From: Home Disposition: Home  Recommendations for Outpatient Follow-up:  1. Follow up with SNF provider at earliest convenience with repeat CBC/BMP in the next week.  Patient has refused blood work over the last few days during the hospital  2. Outpatient evaluation and follow-up by palliative care  3. follow up in ED if symptoms worsen or new appear   Home Health: No Equipment/Devices: None  Discharge Condition: Guarded to poor CODE STATUS: DNR Diet recommendation: Heart healthy/fluid restriction of 1200 cc a day  Brief/Interim Summary: 80 year old female with history of dementia, chronic CHF, hypertension, seizures presented from nursing facility due to worsening lethargy and confusion.  On presentation, valproic acid was found to be low at 27; WBC of thirteen, creatinine of 1.5 and UA was positive for large leukocyte esterase but no WBCs and only rare bacteria.  She was started on IV fluids and antibiotics.  During the hospitalizing, her condition has improved and remained stable.  Patient intermittently has refused blood work over the last few days and intermittently refuses medications.  Psychiatry evaluated the patient and did not recommend inpatient psychiatric hospitalization.  Palliative care evaluated the patient and her CODE STATUS has been changed to DNR.  She will be discharged back to SNF today.  Discharge Diagnoses:   Acute metabolic encephalopathy in the setting of chronic dementia -Possibly secondary to dehydration/acute kidney injury -Patient's mental status has not improved.  Still intermittently agitated with confusion and refusing medications and has refused labs for the last few days. -Psychiatry evaluated the patient and did not recommend inpatient psychiatric hospitalization.  Palliative  care evaluated the patient and her CODE STATUS has been changed to DNR.  She will be discharged back to SNF today.  Patient does not have a UTI -UA on presentation showed large leukocyte esterase but negative for nitrites, only 0-5 WBCs.  This is not UTI.  She was empirically started on Rocephin which was discontinued on 11/27/2020.  Urine culture grew multiple species.  Acute kidney injury -Creatinine 1.5 on presentation.  Creatinine 1.15 on 11/26/2020.  No labs today as well.  Outpatient follow-up.  Hypertension -Blood pressure currently stable.  Continue Coreg.  Lisinopril and Lasix on hold due to AKI  Chronic combined CHF -Continue fluid restriction. Echo showed EF of 60 to 65%.  We will continue to hold lisinopril and Lasix for now and these can be restarted based on kidney function as an outpatient  Seizure disorder -No seizure activity since August 2019.  Continue Keppra, Depakote  Generalized conditioning -Overall condition is guarded to poor.  Palliative care consultation appreciated.  CODE STATUS has been changed to DNR.  Outpatient follow-up with palliative care   Discharge Instructions  Discharge Instructions    Amb Referral to Palliative Care   Complete by: As directed    followup   Diet - low sodium heart healthy   Complete by: As directed    Increase activity slowly   Complete by: As directed    No wound care   Complete by: As directed      Allergies as of 11/29/2020   No Known Allergies     Medication List    STOP taking these medications   furosemide 20 MG tablet Commonly known as: LASIX   lisinopril 2.5 MG tablet Commonly known as: ZESTRIL   PREDNISONE PO  TAKE these medications   acetaminophen 500 MG tablet Commonly known as: TYLENOL Take 500 mg by mouth at bedtime as needed (pain). What changed: Another medication with the same name was changed. Make sure you understand how and when to take each.   acetaminophen 325 MG  tablet Commonly known as: TYLENOL Take 1 tablet (325 mg total) by mouth every 6 (six) hours as needed for fever. What changed:   how much to take  when to take this  additional instructions   carvedilol 3.125 MG tablet Commonly known as: COREG Take 1 tablet (3.125 mg total) by mouth 2 (two) times daily with a meal. What changed: when to take this   diclofenac Sodium 1 % Gel Commonly known as: VOLTAREN Apply 1 application topically in the morning and at bedtime. Right ankle pain   divalproex 125 MG capsule Commonly known as: DEPAKOTE SPRINKLE Take 125 mg by mouth See admin instructions. Take one capsule (125 mg) by mouth twice daily - 10am and 6pm; take two capsules (250 mg) daily at 2pm   levETIRAcetam 250 MG tablet Commonly known as: KEPPRA Take 1 tablet (250 mg total) by mouth every morning.   megestrol 40 MG/ML suspension Commonly known as: MEGACE Take 400 mg by mouth in the morning and at bedtime.   OLANZapine 5 MG tablet Commonly known as: ZYPREXA Take 1 tablet (5 mg total) by mouth at bedtime.   polyethylene glycol 17 g packet Commonly known as: MIRALAX / GLYCOLAX Take 17 g by mouth every morning.   RESOURCE 2.0 PO Take 120 mLs by mouth 3 (three) times daily. What changed: Another medication with the same name was removed. Continue taking this medication, and follow the directions you see here.   sertraline 50 MG tablet Commonly known as: ZOLOFT Take 50 mg by mouth every morning.       No Known Allergies  Consultations:  Palliative care   Procedures/Studies: CT HEAD WO CONTRAST  Result Date: 11/23/2020 CLINICAL DATA:  Altered level of consciousness EXAM: CT HEAD WITHOUT CONTRAST TECHNIQUE: Contiguous axial images were obtained from the base of the skull through the vertex without intravenous contrast. COMPARISON:  11/17/2018 FINDINGS: Brain: Confluent hypodensities in the periventricular white matter are most consistent with chronic small vessel  ischemic changes. No signs of acute infarct or hemorrhage. Lateral ventricles and remaining midline structures are unremarkable. No acute extra-axial fluid collections. No mass effect. Vascular: No hyperdense vessel or unexpected calcification. Skull: Normal. Negative for fracture or focal lesion. Sinuses/Orbits: No acute finding. Other: None. IMPRESSION: 1. Chronic small-vessel ischemic changes in the white matter. No acute intracranial process. Electronically Signed   By: Sharlet Salina M.D.   On: 11/23/2020 17:58   DG Chest Port 1 View  Result Date: 11/23/2020 CLINICAL DATA:  Lethargic, confusion EXAM: PORTABLE CHEST 1 VIEW COMPARISON:  06/06/2018 FINDINGS: Single frontal view of the chest was obtained with the patient rotated to the right. The cardiac silhouette is unremarkable. No airspace disease, effusion, or pneumothorax. No acute bony abnormalities. IMPRESSION: 1. No acute intrathoracic process. Electronically Signed   By: Sharlet Salina M.D.   On: 11/23/2020 16:56   ECHOCARDIOGRAM COMPLETE  Result Date: 11/27/2020    ECHOCARDIOGRAM REPORT   Patient Name:   GLADA WICKSTROM Latulippe Date of Exam: 11/27/2020 Medical Rec #:  [id number]         Height:       63.0 in Accession #:    [id number]        Weight:  111.0 lb Date of Birth:  Mar 06, 1941         BSA:          1.506 m Patient Age:    80 years          BP:           124/62 mmHg Patient Gender: F                 HR:           105 bpm. Exam Location:  Inpatient Procedure: 2D Echo Indications:    dyspnea  History:        Patient has prior history of Echocardiogram examinations, most                 recent 03/30/2018. CHF; Risk Factors:Hypertension and Current                 Smoker.  Sonographer:    Delcie Roch Referring Phys: [id number] [facility]  Sonographer Comments: Technically difficult study due to poor echo windows. Image acquisition challenging due to uncooperative patient and Image acquisition challenging due to respiratory motion.  IMPRESSIONS  1. Left ventricular ejection fraction, by estimation, is 60 to 65%. The left ventricle has normal function. The left ventricle has no regional wall motion abnormalities. There is not well visualized left ventricular hypertrophy. Left ventricular diastolic function could not be evaluated.  2. Right ventricular systolic function is normal. The right ventricular size is normal.  3. The mitral valve is grossly normal. Trivial mitral valve regurgitation. No evidence of mitral stenosis.  4. The aortic valve was not well visualized. Aortic valve regurgitation is not visualized.  5. The inferior vena cava is normal in size with greater than 50% respiratory variability, suggesting right atrial pressure of 3 mmHg. Comparison(s): Changes from prior study are noted. Conclusion(s)/Recommendation(s): Technically challening study due to limited acoustic windows and patient cooperation. Grossly normal LVEF, improved from prior. No focal wall motion abnormalities noted but reduced sensitivity for this based on available images. FINDINGS  Left Ventricle: Left ventricular ejection fraction, by estimation, is 60 to 65%. The left ventricle has normal function. The left ventricle has no regional wall motion abnormalities. The left ventricular internal cavity size was normal in size. There is  not well visualized left ventricular hypertrophy. Left ventricular diastolic function could not be evaluated. Right Ventricle: The right ventricular size is normal. Right vetricular wall thickness was not well visualized. Right ventricular systolic function is normal. Left Atrium: Left atrial size was not well visualized. Right Atrium: Right atrial size was not well visualized. Pericardium: The pericardium was not well visualized. Mitral Valve: The mitral valve is grossly normal. Trivial mitral valve regurgitation. No evidence of mitral valve stenosis. Tricuspid Valve: The tricuspid valve is grossly normal. Tricuspid valve regurgitation  is trivial. No evidence of tricuspid stenosis. Aortic Valve: The aortic valve was not well visualized. Aortic valve regurgitation is not visualized. Pulmonic Valve: The pulmonic valve was not well visualized. Pulmonic valve regurgitation is not visualized. Aorta: The ascending aorta was not well visualized and the aortic root was not well visualized. Venous: The inferior vena cava is normal in size with greater than 50% respiratory variability, suggesting right atrial pressure of 3 mmHg. IAS/Shunts: The interatrial septum was not well visualized.   Diastology LV e' medial:    5.77 cm/s LV E/e' medial:  12.1 LV e' lateral:   5.98 cm/s LV E/e' lateral: 11.7  RIGHT VENTRICLE  IVC RV S prime:     14.50 cm/s  IVC diam: 1.50 cm TAPSE (M-mode): 1.6 cm LEFT ATRIUM           Index       RIGHT ATRIUM          Index LA Vol (A4C): 28.6 ml 19.00 ml/m RA Area:     7.07 cm                                   RA Volume:   12.40 ml 8.24 ml/m  AORTIC VALVE LVOT Vmax:   138.00 cm/s LVOT Vmean:  89.300 cm/s LVOT VTI:    0.220 m MV E velocity: 69.80 cm/s MV A velocity: 146.00 cm/s  SHUNTS MV E/A ratio:  0.48         Systemic VTI: 0.22 m Jodelle Red MD Electronically signed by Jodelle Red MD Signature Date/Time: 11/27/2020/8:12:57 PM    Final        Subjective: Patient seen and examined at bedside.  Wakes up slightly, hardly participates in any conversation.  Very poor historian.  No fever, vomiting or agitation reported by nursing staff.  Discharge Exam: Vitals:   11/29/20 0637 11/29/20 0858  BP: (!) 157/62 (!) 159/64  Pulse: 74 70  Resp:    Temp: 98.2 F (36.8 C)   SpO2: 94%     General: Wakes up slightly, hardly participates in any conversation Cardiovascular: rate controlled, S1/S2 + Respiratory: bilateral decreased breath sounds at bases Abdominal: Soft, NT, ND, bowel sounds + Extremities: no edema, no cyanosis    The results of significant diagnostics from this  hospitalization (including imaging, microbiology, ancillary and laboratory) are listed below for reference.     Microbiology: Recent Results (from the past 240 hour(s))  Blood Cultures (routine x 2)     Status: None   Collection Time: 11/23/20  4:33 PM   Specimen: BLOOD  Result Value Ref Range Status   Specimen Description BLOOD SITE NOT SPECIFIED  Final   Special Requests   Final    BOTTLES DRAWN AEROBIC AND ANAEROBIC Blood Culture results may not be optimal due to an inadequate volume of blood received in culture bottles   Culture   Final    NO GROWTH 5 DAYS Performed at [facility], [address] [place], [place] [address]    Report Status 11/28/2020 FINAL  Final  Resp Panel by RT-PCR (Flu A&B, Covid) Nasopharyngeal Swab     Status: None   Collection Time: 11/23/20  4:33 PM   Specimen: Nasopharyngeal Swab; Nasopharyngeal(NP) swabs in vial transport medium  Result Value Ref Range Status   SARS Coronavirus 2 by RT PCR NEGATIVE NEGATIVE Final    Comment: (NOTE) SARS-CoV-2 target nucleic acids are NOT DETECTED.  The SARS-CoV-2 RNA is generally detectable in upper respiratory specimens during the acute phase of infection. The lowest concentration of SARS-CoV-2 viral copies this assay can detect is 138 copies/mL. A negative result does not preclude SARS-Cov-2 infection and should not be used as the sole basis for treatment or other patient management decisions. A negative result may occur with  improper specimen collection/handling, submission of specimen other than nasopharyngeal swab, presence of viral mutation(s) within the areas targeted by this assay, and inadequate number of viral copies(<138 copies/mL). A negative result must be combined with clinical observations, patient history, and epidemiological information. The expected result is Negative.  Fact  Sheet for Patients:  BloggerCourse.comhttps://www.fda.gov/media/152166/download  Fact Sheet for Healthcare Providers:   SeriousBroker.ithttps://www.fda.gov/media/152162/download  This test is no t yet approved or cleared by the [place] FDA and  has been authorized for detection and/or diagnosis of SARS-CoV-2 by FDA under an Emergency Use Authorization (EUA). This EUA will remain  in effect (meaning this test can be used) for the duration of the COVID-19 declaration under Section 564(b)(1) of the Act, 21 U.S.C.section 360bbb-3(b)(1), unless the authorization is terminated  or revoked sooner.       Influenza A by PCR NEGATIVE NEGATIVE Final   Influenza B by PCR NEGATIVE NEGATIVE Final    Comment: (NOTE) The Xpert Xpress SARS-CoV-2/FLU/RSV plus assay is intended as an aid in the diagnosis of influenza from Nasopharyngeal swab specimens and should not be used as a sole basis for treatment. Nasal washings and aspirates are unacceptable for Xpert Xpress SARS-CoV-2/FLU/RSV testing.  Fact Sheet for Patients: BloggerCourse.comhttps://www.fda.gov/media/152166/download  Fact Sheet for Healthcare Providers: SeriousBroker.ithttps://www.fda.gov/media/152162/download  This test is not yet approved or cleared by the [place] FDA and has been authorized for detection and/or diagnosis of SARS-CoV-2 by FDA under an Emergency Use Authorization (EUA). This EUA will remain in effect (meaning this test can be used) for the duration of the COVID-19 declaration under Section 564(b)(1) of the Act, 21 U.S.C. section 360bbb-3(b)(1), unless the authorization is terminated or revoked.  Performed at [facility], [address] [place], [place] [address]   Blood Cultures (routine x 2)     Status: None   Collection Time: 11/23/20  4:44 PM   Specimen: BLOOD  Result Value Ref Range Status   Specimen Description BLOOD SITE NOT SPECIFIED  Final   Special Requests   Final    BOTTLES DRAWN AEROBIC AND ANAEROBIC Blood Culture adequate volume   Culture   Final    NO GROWTH 5 DAYS Performed at [facility], [address] [place], [place]  [address]    Report Status 11/28/2020 FINAL  Final  Urine culture     Status: Abnormal   Collection Time: 11/24/20 12:21 AM   Specimen: Urine, Random  Result Value Ref Range Status   Specimen Description URINE, RANDOM  Final   Special Requests   Final    NONE Performed at [facility], [address] [place], [place] [address]    Culture MULTIPLE SPECIES PRESENT, SUGGEST RECOLLECTION (A)  Final   Report Status 11/25/2020 FINAL  Final  Culture, Urine     Status: Abnormal   Collection Time: 11/25/20  9:15 PM   Specimen: Urine, Random  Result Value Ref Range Status   Specimen Description URINE, RANDOM  Final   Special Requests   Final    NONE Performed at [facility], [address] [place], [place] [address]    Culture MULTIPLE SPECIES PRESENT, SUGGEST RECOLLECTION (A)  Final   Report Status 11/27/2020 FINAL  Final     Labs: BNP (last 3 results) No results for input(s): BNP in the last 8760 hours. Basic Metabolic Panel: Recent Labs  Lab 11/23/20 1643 11/24/20 0609 11/25/20 0457 11/26/20 0902  NA 141 139 140 138  K 4.7 4.4 5.6* 4.9  CL 110 109 110 110  CO2 22 20* 19* 19*  GLUCOSE 94 96 83 94  BUN 58* 42* 23 26*  CREATININE 1.50* 1.17* 1.04* 1.15*  CALCIUM 8.7* 8.9 9.3 8.8*   Liver Function Tests: Recent Labs  Lab 11/23/20 1643  AST  21  ALT 27  ALKPHOS 40  BILITOT 0.7  PROT 6.6  ALBUMIN 3.4*   No results for input(s): LIPASE, AMYLASE in the last 168 hours. Recent Labs  Lab 11/23/20 1644  AMMONIA 38*   CBC: Recent Labs  Lab 11/23/20 1643 11/24/20 0609 11/25/20 0457  WBC 13.0* 14.1* 13.1*  NEUTROABS 6.3  --  7.0  HGB 12.0 12.0 12.0  HCT 36.7 38.0 36.0  MCV 95.6 96.2 93.3  PLT 275 250 222   Cardiac Enzymes: No results for input(s): CKTOTAL, CKMB, CKMBINDEX, TROPONINI in the last 168 hours. BNP: Invalid input(s): POCBNP CBG: Recent Labs  Lab 11/23/20 1636  GLUCAP 84   D-Dimer No results for input(s): DDIMER in the last 72  hours. Hgb A1c No results for input(s): HGBA1C in the last 72 hours. Lipid Profile No results for input(s): CHOL, HDL, LDLCALC, TRIG, CHOLHDL, LDLDIRECT in the last 72 hours. Thyroid function studies No results for input(s): TSH, T4TOTAL, T3FREE, THYROIDAB in the last 72 hours.  Invalid input(s): FREET3 Anemia work up No results for input(s): VITAMINB12, FOLATE, FERRITIN, TIBC, IRON, RETICCTPCT in the last 72 hours. Urinalysis    Component Value Date/Time   COLORURINE YELLOW 11/24/2020 0021   APPEARANCEUR CLEAR 11/24/2020 0021   LABSPEC 1.021 11/24/2020 0021   PHURINE 6.0 11/24/2020 0021   GLUCOSEU NEGATIVE 11/24/2020 0021   HGBUR NEGATIVE 11/24/2020 0021   BILIRUBINUR NEGATIVE 11/24/2020 0021   KETONESUR NEGATIVE 11/24/2020 0021   PROTEINUR NEGATIVE 11/24/2020 0021   UROBILINOGEN 0.2 08/19/2015 0756   NITRITE NEGATIVE 11/24/2020 0021   LEUKOCYTESUR LARGE (A) 11/24/2020 0021   Sepsis Labs Invalid input(s): PROCALCITONIN,  WBC,  LACTICIDVEN Microbiology Recent Results (from the past 240 hour(s))  Blood Cultures (routine x 2)     Status: None   Collection Time: 11/23/20  4:33 PM   Specimen: BLOOD  Result Value Ref Range Status   Specimen Description BLOOD SITE NOT SPECIFIED  Final   Special Requests   Final    BOTTLES DRAWN AEROBIC AND ANAEROBIC Blood Culture results may not be optimal due to an inadequate volume of blood received in culture bottles   Culture   Final    NO GROWTH 5 DAYS Performed at [facility], [address] [place], [place] [address]    Report Status 11/28/2020 FINAL  Final  Resp Panel by RT-PCR (Flu A&B, Covid) Nasopharyngeal Swab     Status: None   Collection Time: 11/23/20  4:33 PM   Specimen: Nasopharyngeal Swab; Nasopharyngeal(NP) swabs in vial transport medium  Result Value Ref Range Status   SARS Coronavirus 2 by RT PCR NEGATIVE NEGATIVE Final    Comment: (NOTE) SARS-CoV-2 target nucleic acids are NOT DETECTED.  The SARS-CoV-2 RNA is  generally detectable in upper respiratory specimens during the acute phase of infection. The lowest concentration of SARS-CoV-2 viral copies this assay can detect is 138 copies/mL. A negative result does not preclude SARS-Cov-2 infection and should not be used as the sole basis for treatment or other patient management decisions. A negative result may occur with  improper specimen collection/handling, submission of specimen other than nasopharyngeal swab, presence of viral mutation(s) within the areas targeted by this assay, and inadequate number of viral copies(<138 copies/mL). A negative result must be combined with clinical observations, patient history, and epidemiological information. The expected result is Negative.  Fact Sheet for Patients:  BloggerCourse.com  Fact Sheet for Healthcare Providers:  SeriousBroker.it  This test is no t yet approved or  cleared by the [place] and  has been authorized for detection and/or diagnosis of SARS-CoV-2 by FDA under an Emergency Use Authorization (EUA). This EUA will remain  in effect (meaning this test can be used) for the duration of the COVID-19 declaration under Section 564(b)(1) of the Act, 21 U.S.C.section 360bbb-3(b)(1), unless the authorization is terminated  or revoked sooner.       Influenza A by PCR NEGATIVE NEGATIVE Final   Influenza B by PCR NEGATIVE NEGATIVE Final    Comment: (NOTE) The Xpert Xpress SARS-CoV-2/FLU/RSV plus assay is intended as an aid in the diagnosis of influenza from Nasopharyngeal swab specimens and should not be used as a sole basis for treatment. Nasal washings and aspirates are unacceptable for Xpert Xpress SARS-CoV-2/FLU/RSV testing.  Fact Sheet for Patients: BloggerCourse.com  Fact Sheet for Healthcare Providers: SeriousBroker.it  This test is not yet approved or cleared by the [place] FDA and has been authorized for detection and/or diagnosis of SARS-CoV-2 by FDA under an Emergency Use Authorization (EUA). This EUA will remain in effect (meaning this test can be used) for the duration of the COVID-19 declaration under Section 564(b)(1) of the Act, 21 U.S.C. section 360bbb-3(b)(1), unless the authorization is terminated or revoked.  Performed at [facility], [address], [place], [place] [address]   Blood Cultures (routine x 2)     Status: None   Collection Time: 11/23/20  4:44 PM   Specimen: BLOOD  Result Value Ref Range Status   Specimen Description BLOOD SITE NOT SPECIFIED  Final   Special Requests   Final    BOTTLES DRAWN AEROBIC AND ANAEROBIC Blood Culture adequate volume   Culture   Final    NO GROWTH 5 DAYS Performed at [facility], [address], [place], [place] [address]    Report Status 11/28/2020 FINAL  Final  Urine culture     Status: Abnormal   Collection Time: 11/24/20 12:21 AM   Specimen: Urine, Random  Result Value Ref Range Status   Specimen Description URINE, RANDOM  Final   Special Requests   Final    NONE Performed at [facility], [address], [place], [place] [address]    Culture MULTIPLE SPECIES PRESENT, SUGGEST RECOLLECTION (A)  Final   Report Status 11/25/2020 FINAL  Final  Culture, Urine     Status: Abnormal   Collection Time: 11/25/20  9:15 PM   Specimen: Urine, Random  Result Value Ref Range Status   Specimen Description URINE, RANDOM  Final   Special Requests   Final    NONE Performed at [facility], [address], [place], [place] [address]    Culture MULTIPLE SPECIES PRESENT, SUGGEST RECOLLECTION (A)  Final   Report Status 11/27/2020 FINAL  Final     Time coordinating discharge: 35 minutes  SIGNED:   Glade Lloyd, MD  [facility] 11/29/2020, 10:38 AM

## 2020-11-29 NOTE — Progress Notes (Addendum)
Patient refused morning vital sign check, RN will continue to monitor this patient.

## 2020-11-29 NOTE — TOC Transition Note (Signed)
Transition of Care [facility]) - CM/SW Discharge Note *Discharged back to [facility] *Room [address] [place] [phone]   Patient Details  Name: LAURELIN ELSON MRN: [id number] Date of Birth: 1941/01/26  Transition of Care [facility]) CM/SW Contact:  Cristobal Goldmann, LCSW Phone Number: 11/29/2020, 11:40 AM   Clinical Narrative: Patient medically stable for discharge and will return to [facility], where she is a LTC resident. Facility admissions director Shazma contacted and informed regarding discharge and discharge clinicals transmitted to facility. Nurse provided with information to call report.       Final next level of care: Skilled Nursing Facility [facility]) Barriers to Discharge: Barriers Resolved   Patient Goals and CMS Choice Patient states their goals for this hospitalization and ongoing recovery are:: Patient LTC resident at [facility] and will return there at discharge CMS Medicare.gov Compare Post Acute Care list provided to:: Other (Comment Required) (Not provided as patient LTC resident at [facility]) Choice offered to / list presented to : NA  Discharge Placement   Existing PASRR number confirmed : 11/29/20          Patient chooses bed at: [facility] Patient to be transferred to facility by: Non-emergency ambulance transport Name of family member notified: Eyleen Rawlinson - [phone] (called and voice mail left) Patient and family notified of of transfer: 11/29/20  Discharge Plan and Services In-house Referral: Clinical Social Work   Post Acute Care Choice: Nursing Home                               Social Determinants of Health (SDOH) Interventions  No SDOH interventions requested or needed at this time.   Readmission Risk Interventions No flowsheet data found.

## 2020-11-29 NOTE — Progress Notes (Signed)
DISCHARGE NOTE SNF [place] T Retter to be discharged SNF per MD order. Patient verbalized understanding.  Skin clean, dry and intact without evidence of skin break down, no evidence of skin tears noted. IV catheter discontinued intact. Site without signs and symptoms of complications. Dressing and pressure applied. Pt denies pain at the site currently. No complaints noted.  Patient free of lines, drains, and wounds.   Discharge packet assembled. An After Visit Summary (AVS) was printed and given to the EMS personnel. Patient escorted via stretcher and discharged to [facility] via ambulance. Report called to accepting facility; all questions and concerns addressed.   RN attempted to call report on 2 separate occassions to receiving nurse at [facility] Rm. 121 where the patient will be discharging and returning too with no response.   Pat Patrick, RN

## 2020-11-29 NOTE — Progress Notes (Signed)
RN was unsuccessful in obtaining the COVID swab I attempted multiple times and each time the patient slapped my hand away at one point knocking the COVID swab to the ground. MD has been notified, RN will continue to monitor this patient.

## 2020-11-29 NOTE — NC FL2 (Signed)
[place] MEDICAID FL2 LEVEL OF CARE SCREENING TOOL     IDENTIFICATION  Patient Name: [name] Birthdate: 1941-05-01 Sex: female Admission Date (Current Location): 11/23/2020  [place] and [place] Number:  Haynes Bast [id number] T Facility and Address:         Provider Number: [id number]  Attending Physician Name and Address:  Glade Lloyd, MD  Relative Name and Phone Number:  Emelynn Rance - son - [phone]    Current Level of Care: Hospital Recommended Level of Care: Nursing Facility (Patient returning to [facility], where she is a LTC resident) Prior Approval Number:    Date Approved/Denied:   PASRR Number:    Discharge Plan: SNF    Current Diagnoses: Patient Active Problem List   Diagnosis Date Noted  . Pressure injury of skin 11/25/2020  . Adult failure to thrive   . Palliative care by specialist   . DNR (do not resuscitate)   . Weakness generalized   . Respiratory failure (HCC)   . Seizure (HCC)   . Seizure disorder (HCC) 06/04/2018  . Status epilepticus (HCC)   . Acute metabolic encephalopathy   . Acute hypercapnic respiratory failure (HCC) 04/01/2018  . Acute CHF (congestive heart failure) (HCC) 03/29/2018  . CKD (chronic kidney disease) stage 2, GFR 60-89 ml/min 03/29/2018  . Complicated UTI (urinary tract infection)   . Acute confusional state 09/17/2016  . Rhabdomyolysis 07/02/2016  . Fall at home 07/01/2016  . Lactic acid acidosis 12/01/2015  . Metabolic acidosis 12/01/2015  . Protein-calorie malnutrition, severe (HCC) 12/01/2015  . AKI (acute kidney injury) (HCC)   . Hyponatremia   . Hypothermia   . Tachycardia   . Hyperkalemia 11/29/2015  . Acute renal failure (HCC) 11/29/2015  . Chronic combined systolic and diastolic CHF, 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/2015  . Mitral regurgitation 11/29/2015  . Elevated lactic acid level 11/29/2015  . Agitation 11/29/2015  . COPD (chronic obstructive pulmonary disease) (HCC) 11/29/2015  . Dementia  (HCC) 11/29/2015  . Confusion   . Acute blood loss anemia 08/21/2015  . Postoperative anemia due to acute blood loss 08/20/2015  . Displaced intertrochanteric fracture of left femur (HCC) 08/19/2015  . Closed left hip fracture (HCC) 08/18/2015  . Secondary cardiomyopathy (HCC)   . Acute systolic congestive heart failure, NYHA class 3 (HCC) 03/02/2015  . COPD exacerbation (HCC) 03/02/2015  . Tobacco abuse 03/02/2015  . Severe protein-calorie malnutrition (HCC) 03/02/2015  . HTN (hypertension) 03/02/2015  . Congestive dilated cardiomyopathy (HCC)   . CVA (cerebral infarction) 02/28/2015  . Stroke [facility])     Orientation RESPIRATION BLADDER Height & Weight     Self  Normal Incontinent Weight: 104 lb 8 oz (47.4 kg) Height:  5\' 7"  (170.2 cm)  BEHAVIORAL SYMPTOMS/MOOD NEUROLOGICAL BOWEL NUTRITION STATUS      Continent Diet (Heart healthy)  AMBULATORY STATUS COMMUNICATION OF NEEDS Skin   Extensive Assist Verbally Other (Comment) (Stage 2 wound to right outer ankle with foam dressing)                       Personal Care Assistance Level of Assistance  Bathing,Feeding,Dressing Bathing Assistance: Maximum assistance Feeding assistance: Limited assistance (assistance with set-up) Dressing Assistance: Maximum assistance     Functional Limitations Info  Sight,Hearing,Speech Sight Info: Adequate Hearing Info: Adequate Speech Info: Adequate    SPECIAL CARE FACTORS FREQUENCY   (Not evaluated by PT/OT)  Contractures Contractures Info: Not present    Additional Factors Info  Code Status,Allergies Code Status Info: DNR Allergies Info: No known allergies           Current Medications (11/29/2020):  This is the current hospital active medication list Current Facility-Administered Medications  Medication Dose Route Frequency Provider Last Rate Last Admin  . acetaminophen (TYLENOL) tablet 650 mg  650 mg Oral Q6H PRN Hillary Bow, DO   650 mg at  11/27/20 2046   Or  . acetaminophen (TYLENOL) suppository 650 mg  650 mg Rectal Q6H PRN Hillary Bow, DO      . carvedilol (COREG) tablet 3.125 mg  3.125 mg Oral BID WC Lyda Perone M, DO   3.125 mg at 11/29/20 0858  . cefTRIAXone (ROCEPHIN) 1 g in sodium chloride 0.9 % 100 mL IVPB  1 g Intravenous Q24H Swayze, Ava, DO 200 mL/hr at 11/28/20 1646 1 g at 11/28/20 1646  . diclofenac Sodium (VOLTAREN) 1 % topical gel 4 g  4 g Topical BID Hillary Bow, DO   4 g at 11/27/20 2047  . divalproex (DEPAKOTE SPRINKLE) capsule 125 mg  125 mg Oral BID Lyda Perone M, DO   125 mg at 11/29/20 1010  . divalproex (DEPAKOTE SPRINKLE) capsule 250 mg  250 mg Oral Q1400 Hillary Bow, DO   250 mg at 11/28/20 1420  . enoxaparin (LOVENOX) injection 30 mg  30 mg Subcutaneous Q24H Lyda Perone M, DO   30 mg at 11/26/20 1749  . feeding supplement (BOOST / RESOURCE BREEZE) liquid 1 Container  237 mL Oral TID Hillary Bow, DO   1 Container at 11/29/20 1012  . levETIRAcetam (KEPPRA) tablet 250 mg  250 mg Oral Daily Lyda Perone M, DO   250 mg at 11/29/20 1009  . megestrol (MEGACE) 400 MG/10ML suspension 400 mg  400 mg Oral BID Leander Rams, RPH   400 mg at 11/28/20 2130  . OLANZapine (ZYPREXA) tablet 5 mg  5 mg Oral QHS Antony Madura, PA-C   5 mg at 11/28/20 2131  . ondansetron (ZOFRAN) tablet 4 mg  4 mg Oral Q6H PRN Hillary Bow, DO       Or  . ondansetron [facility]) injection 4 mg  4 mg Intravenous Q6H PRN Hillary Bow, DO      . polyethylene glycol (MIRALAX / GLYCOLAX) packet 17 g  17 g Oral Daily Lyda Perone M, DO   17 g at 11/26/20 0913  . sertraline (ZOLOFT) tablet 50 mg  50 mg Oral Daily Lyda Perone M, DO   50 mg at 11/29/20 1010     Discharge Medications: Please see discharge summary for a list of discharge medications.  Relevant Imaging Results:  Relevant Lab Results:   Additional Information ss#[id number]  Cristobal Goldmann, LCSW

## 2020-12-18 ENCOUNTER — Non-Acute Institutional Stay: Payer: Self-pay | Admitting: Hospice

## 2020-12-18 ENCOUNTER — Other Ambulatory Visit: Payer: Self-pay

## 2020-12-18 DIAGNOSIS — E440 Moderate protein-calorie malnutrition: Secondary | ICD-10-CM

## 2020-12-18 DIAGNOSIS — Z515 Encounter for palliative care: Secondary | ICD-10-CM

## 2020-12-18 DIAGNOSIS — F0391 Unspecified dementia with behavioral disturbance: Secondary | ICD-10-CM

## 2020-12-18 DIAGNOSIS — R531 Weakness: Secondary | ICD-10-CM

## 2020-12-18 NOTE — Progress Notes (Signed)
PATIENT NAME: Tammy [name] [place] [address] [phone] (home)  DOB: 06/21/1941 MRN: [id number]  PRIMARY CARE PROVIDER:    Laurann Montana, MD,  [address] [place] [address] [phone]  REFERRING PROVIDER:   Kathe Becton, NP  RESPONSIBLE PARTY: Son: Rejina Odle [phone]  CHIEF COMPLAIN: Initial palliative care visit/weakness  Visit is to build trust and highlight Palliative Medicine as specialized medical care for people living with serious illness, aimed at facilitating better quality of life through symptoms relief, assisting with advance care plan and establishing goals of care. NP called son- could not leave voicemail because voicemail not set up. Discussion on the difference between Palliative and Hospice care.  Visit consisted of counseling and education dealing with the complex and emotionally intense issues of symptom management and palliative care in the setting of serious and potentially life-threatening illness. Palliative care team will continue to support patient, patient's family, and medical team.  RECOMMENDATIONS/PLAN:   Advance Care Planning/CODE STATUS:Patient is a Do Not Resuscitate  GOALS OF CARE: Goals of care include to maximize quality of life and symptom management.  I spent 46 minutes providing this initial consultation. More than 50% of the time in this consultation was spent on counseling patient and coordinating communication. -------------------------------------------------------------------------------------------------------------------------------------- Symptom Management/Plan:  Weakness: Continue ongoing PT OT.  Energy conservation. Balance of rest, and performance activity.  Poor oral intake: assist with feeding to ensure adequate oral intake. Continue enriched meals, mechanical soft texture. Boost plus TID with meals. Aspiration precautions. Dietary consult as needed.  Palliative will  continue to monitor for symptom management/decline and make recommendations as needed. Return 6 weeks or prn. Encouraged to call provider sooner with any concerns.   PPS: 40%  Family /Caregiver/Community Supports: Patient in SNF for acute rehab    HISTORY OF PRESENT ILLNESS:  Tammy [name] is a 80 y.o. female with multiple medical problems including DEMENTIA- FAST 7a, chronic CHF, hypertension, seizures, generalized anxiety disorder, protein caloric malnutrition, dysphagia.  Recent hospitalization 2/18-2/24/2022 for acute metabolic encephalopathy in the context of chronic dementia and possible dehydration/acute kidney injury; patient treated and discharged to SNF for acute rehab.  Weakness is acute on chronic, exacerbated by recent hospitalization; it impairs patient's independence and activities of daily living, worsened by patient refusing to eat/care/medications, per nursing. Ongoing physical therapy is helpful; patient not always cooperative, per nursing. History obtained from review of EMR, discussion with primary nurse/patient.   Review and summarization of Epic records shows history from other than patient. Rest of 10 point ROS asked and negative.  Palliative Care was asked to follow this patient by consultation request of Kathe Becton, NP to help address complex decision making in the context of advance care planning and goals of care clarification.    HOSPICE ELIGIBILITY/DIAGNOSIS: TBD  PAST MEDICAL HISTORY:  Past Medical History:  Diagnosis Date  . CHF (congestive heart failure) (HCC)   . CKD (chronic kidney disease)   . COPD (chronic obstructive pulmonary disease) (HCC)   . Dementia (HCC)   . Hypertension   . Insomnia      SOCIAL HX:  Social History   Tobacco Use  . Smoking status: Current Every Day Smoker    Packs/day: 2.00    Years: 50.00    Pack years: 100.00    Types: Cigarettes  . Smokeless tobacco: Never Used  Substance Use Topics  . Alcohol use: No     FAMILY HX:  Family  History  Problem Relation Age of Onset  . Congestive Heart Failure Son 55    Review of lab tests/diagnostics   No results for input(s): WBC, HGB, HCT, PLT, MCV in the last 168 hours. No results for input(s): NA, K, CL, CO2, BUN, CREATININE, GLUCOSE in the last 168 hours. Latest GFR by Cockcroft Gault (not valid in AKI or ESRD) CrCl cannot be calculated (Patient's most recent lab result is older than the maximum 21 days allowed.). No results for input(s): AST, ALT, ALKPHOS, GGT in the last 168 hours.  Invalid input(s): TBILI, CONJBILI, ALB, TOTALPROTEIN No components found for: ALB No results for input(s): APTT, INR in the last 168 hours.  Invalid input(s): PTPATIENT No results for input(s): BNP, PROBNP in the last 168 hours.  ALLERGIES: No Known Allergies    PERTINENT MEDICATIONS:  Outpatient Encounter Medications as of 12/18/2020  Medication Sig  . acetaminophen (TYLENOL) 325 MG tablet Take 1 tablet (325 mg total) by mouth every 6 (six) hours as needed for fever. (Patient taking differently: Take 650 mg by mouth See admin instructions. Take 2 tablets (650 mg) by mouth three times daily, may also take 2 tablets (650 mg) every 6 hours as needed for fever)  . acetaminophen (TYLENOL) 500 MG tablet Take 500 mg by mouth at bedtime as needed (pain).  . carvedilol (COREG) 3.125 MG tablet Take 1 tablet (3.125 mg total) by mouth 2 (two) times daily with a meal. (Patient taking differently: Take 3.125 mg by mouth every morning.)  . diclofenac Sodium (VOLTAREN) 1 % GEL Apply 1 application topically in the morning and at bedtime. Right ankle pain  . divalproex (DEPAKOTE SPRINKLE) 125 MG capsule Take 125 mg by mouth See admin instructions. Take one capsule (125 mg) by mouth twice daily - 10am and 6pm; take two capsules (250 mg) daily at 2pm  . levETIRAcetam (KEPPRA) 250 MG tablet Take 1 tablet (250 mg total) by mouth every morning.  . megestrol (MEGACE) 40 MG/ML suspension  Take 400 mg by mouth in the morning and at bedtime.  . Nutritional Supplements (RESOURCE 2.0 PO) Take 120 mLs by mouth 3 (three) times daily.  Marland Kitchen OLANZapine (ZYPREXA) 5 MG tablet Take 1 tablet (5 mg total) by mouth at bedtime.  . polyethylene glycol (MIRALAX / GLYCOLAX) 17 g packet Take 17 g by mouth every morning.  . sertraline (ZOLOFT) 50 MG tablet Take 50 mg by mouth every morning.   No facility-administered encounter medications on file as of 12/18/2020.   ROS  General: NAD EYES: No vision changes ENMT: No dysphagia no xerostomia Cardiovascular: No chest pain Pulmonary: No cough, SOB  Abdomen:no constipation or diarrhea GU: No dysuria or urinary frequency MSK:   ROM limitations, no falls reported Skin: No rashes or wounds Neurological: weakness Psych:  positive mood Heme/lymph/immuno: No bruises or abnormal bleeding   PHYSICAL EXAM Height: 5 feet 3 inches  Weight:112.4 Ibs BMI 19.84 Constitutional: In no acute distress, well developed and well nourished Cardiovascular: regular rate and rhythm; no edema in BLE Pulmonary: no cough, no increased work of breathing, normal respiratory effort Abdomen: soft, non tender, positive bowel sounds in all quadrants GU:  no suprapubic tenderness Eyes: Normal lids, no discharge, sclera anicteric ENMT: Moist mucous membranes Musculoskeletal:  Moderate sarcopenia Skin: no rash to visible skin, dry skin,warm without cyanosis Psych: non-anxious affect Neurological: Weakness but otherwise non focal, memory loss Heme/lymph/immuno: no bruises, no bleeding  Thank you for the opportunity to participate in the care of [place]  T Kothari Please call our office at [phone] if we can be of additional assistance.  Note: Portions of this note were generated with Scientist, clinical (histocompatibility and immunogenetics). Dictation errors may occur despite best attempts at proofreading.  Rosaura Carpenter, NP

## 2021-02-22 ENCOUNTER — Non-Acute Institutional Stay: Payer: Medicare Other | Admitting: Hospice

## 2021-02-22 ENCOUNTER — Other Ambulatory Visit: Payer: Self-pay

## 2021-02-22 DIAGNOSIS — R531 Weakness: Secondary | ICD-10-CM

## 2021-02-22 DIAGNOSIS — F0391 Unspecified dementia with behavioral disturbance: Secondary | ICD-10-CM

## 2021-02-22 DIAGNOSIS — Z515 Encounter for palliative care: Secondary | ICD-10-CM

## 2021-02-22 DIAGNOSIS — R451 Restlessness and agitation: Secondary | ICD-10-CM

## 2021-02-22 NOTE — Progress Notes (Signed)
Therapist, nutritional Palliative Care Consult Note Telephone: [phone]  Fax: [phone]  PATIENT NAME: Tammy [name] DOB: Mar 10, 1941 MRN: [id number]  PRIMARY CARE PROVIDER:   Laurann Montana, MD Laurann Montana, MD [phone] [address] [place],  [place] [address]  REFERRING PROVIDER:   Kathe Becton, NP  RESPONSIBLE PARTY: Son: Joelyn Lover [phone]  CHIEF COMPLAIN: follow-up visit/weakness  Visit is to build trust and highlight Palliative Medicine as specialized medical care for people living with serious illness, aimed at facilitating better quality of life through symptoms relief, assisting with advance care plan and establishing goals of care.   RECOMMENDATIONS/PLAN:   Advance Care Planning/CODE STATUS:Patient is a Do Not Resuscitate  GOALS OF CARE: Goals of care include to maximize quality of life and symptom management.  Symptom Management/Plan:  Weakness:  PT OT is ongoing.  Energy conservation. Balance of rest, and performance activity.  Poor appetite: Improving; assist with feeding to ensure adequate oral intake. Continue enriched meals, mechanical soft texture. Boost plus TID with meals. Aspiration precautions. Dietary consult as needed.  Depression: Continue Zoloft as ordered.  Psych consults as planned/needed. Agitation related to dementia managed with quetiapine and Depakote sprinkles.  DEMENTIA- FAST 7a Palliative will continue to monitor for symptom management/decline and make recommendations as needed. Return6weeks or prn. Encouraged to call provider sooner with any concerns.   PPS: 40%  Family /Caregiver/Community Supports: Patient in SNF for acute rehab    HISTORY OF PRESENT ILLNESS:  [name] is a 80 y.o. female with multiple medical problems including weakness, DEMENTIA- FAST 7a, chronic CHF, hypertension, seizures, generalized anxiety disorder, protein caloric malnutrition, dysphagia. Weakness is  improving; patient now able to.  Ongoing physical/occupational therapy is helpful  History obtained from review of EMR, discussion with primary nurse/patient.History obtained from review of EMR, discussion with primary team, family and/or patient. Records reviewed and summarized above. All 10 point systems reviewed and are negative except as documented in history of present illness above  Review and summarization of Epic records shows history from other than patient.   Palliative Care was asked to follow this patient by consultation request of Laurann Montana, MD to help address complex decision making in the context of advance care planning and goals of care clarification.   PPS: 40%  ROS  General: NAD EYES: No vision changes ENMT: No dysphagia no xerostomia Cardiovascular: No chest pain Pulmonary: No cough, SOB  Abdomen:no constipation or diarrhea GU: No dysuria or urinary frequency MSK:  ROM limitations, no falls reported Skin: No rashes or wounds Neurological: weakness Psych:  positive mood Heme/lymph/immuno: No bruises or abnormal bleeding   PHYSICAL EXAM Constitutional: In no acute distress Cardiovascular: regular rate and rhythm; no edema in BLE Pulmonary: no cough, no increased work of breathing, normal respiratory effort Abdomen: soft, non tender, positive bowel sounds in all quadrants GU:  no suprapubic tenderness Eyes: Normal lids, no discharge, sclera anicteric ENMT: Moist mucous membranes Musculoskeletal:  Moderate sarcopenia Skin: no rash to visible skin, dry skin,warm without cyanosis Psych: non-anxious affect Neurological: Weakness but otherwise non focal, memory loss Heme/lymph/immuno: no bruises, no bleeding   PERTINENT MEDICATIONS:  Outpatient Encounter Medications as of 02/22/2021  Medication Sig  . acetaminophen (TYLENOL) 325 MG tablet Take 1 tablet (325 mg total) by mouth every 6 (six) hours as needed for fever. (Patient taking differently: Take 650 mg  by mouth See admin instructions. Take 2 tablets (650 mg) by mouth three times daily, may  also take 2 tablets (650 mg) every 6 hours as needed for fever)  . acetaminophen (TYLENOL) 500 MG tablet Take 500 mg by mouth at bedtime as needed (pain).  . carvedilol (COREG) 3.125 MG tablet Take 1 tablet (3.125 mg total) by mouth 2 (two) times daily with a meal. (Patient taking differently: Take 3.125 mg by mouth every morning.)  . diclofenac Sodium (VOLTAREN) 1 % GEL Apply 1 application topically in the morning and at bedtime. Right ankle pain  . divalproex (DEPAKOTE SPRINKLE) 125 MG capsule Take 125 mg by mouth See admin instructions. Take one capsule (125 mg) by mouth twice daily - 10am and 6pm; take two capsules (250 mg) daily at 2pm  . levETIRAcetam (KEPPRA) 250 MG tablet Take 1 tablet (250 mg total) by mouth every morning.  . megestrol (MEGACE) 40 MG/ML suspension Take 400 mg by mouth in the morning and at bedtime.  . Nutritional Supplements (RESOURCE 2.0 PO) Take 120 mLs by mouth 3 (three) times daily.  Marland Kitchen OLANZapine (ZYPREXA) 5 MG tablet Take 1 tablet (5 mg total) by mouth at bedtime.  . polyethylene glycol (MIRALAX / GLYCOLAX) 17 g packet Take 17 g by mouth every morning.  . sertraline (ZOLOFT) 50 MG tablet Take 50 mg by mouth every morning.   No facility-administered encounter medications on file as of 02/22/2021.    HOSPICE ELIGIBILITY/DIAGNOSIS: TBD  PAST MEDICAL HISTORY:  Past Medical History:  Diagnosis Date  . CHF (congestive heart failure) (HCC)   . CKD (chronic kidney disease)   . COPD (chronic obstructive pulmonary disease) (HCC)   . Dementia (HCC)   . Hypertension   . Insomnia      SOCIAL HX: @SOCX  Patient lives at SNF for ongoing care  FAMILY HX:  Family History  Problem Relation Age of Onset  . Congestive Heart Failure Son 55    ALLERGIES: No Known Allergies    I spent 50  minutes providing this consultation; this includes time spent with patient/family, chart review  and documentation. More than 50% of the time in this consultation was spent on counseling and coordinating communication   Thank you for the opportunity to participate in the care of T Goettl Please call our office at [phone] if we can be of additional assistance.  Note: Portions of this note were generated with [phone]. Dictation errors may occur despite best attempts at proofreading.  Scientist, clinical (histocompatibility and immunogenetics), NP

## 2021-04-30 ENCOUNTER — Other Ambulatory Visit: Payer: Self-pay

## 2021-04-30 ENCOUNTER — Non-Acute Institutional Stay: Payer: Medicare Other | Admitting: Hospice

## 2021-04-30 DIAGNOSIS — F0391 Unspecified dementia with behavioral disturbance: Secondary | ICD-10-CM

## 2021-04-30 DIAGNOSIS — R451 Restlessness and agitation: Secondary | ICD-10-CM

## 2021-04-30 DIAGNOSIS — Z515 Encounter for palliative care: Secondary | ICD-10-CM

## 2021-04-30 DIAGNOSIS — R531 Weakness: Secondary | ICD-10-CM

## 2021-04-30 NOTE — Progress Notes (Signed)
Therapist, nutritional Palliative Care Consult Note Telephone: [phone]  Fax: [phone]  PATIENT NAME: Tammy [name] DOB: Mar 30, 1941 MRN: [id number]  PRIMARY CARE PROVIDER:   Laurann Montana, MD Laurann Montana, MD [phone] [address] [place],  [place] [address]  REFERRING PROVIDER:  Kathe Becton, NP  RESPONSIBLE PARTY:  Son: Renate Danh [phone] [id number] Information     Name Relation Home Work Mobile   Kotowski,Michael Debby Bud) Son  [phone]   Messer,Victoria Daughter   [phone]       Visit is to build trust and highlight Palliative Medicine as specialized medical care for people living with serious illness, aimed at facilitating better quality of life through symptoms relief, assisting with advance care planning and complex medical decision making. This is a follow up visit.  RECOMMENDATIONS/PLAN:   Advance Care Planning/Code Status:Patient is a Do Not Resuscitate  Goals of Care: Goals of care include to maximize quality of life and symptom management.  Visit consisted of counseling and education dealing with the complex and emotionally intense issues of symptom management and palliative care in the setting of serious and potentially life-threatening illness. Palliative care team will continue to support patient, patient's family, and medical team.  Symptom management/Plan:  Weakness:  Improved. Completed PT OT.  Energy conservation. Balance of rest, and performance activity.  Poor appetite: Improved with some weight gain. Continue to assist with feeding to ensure adequate oral intake. Continue enriched meals, mechanical soft texture. Boost plus TID with meals. Aspiration precautions. Dietary consult as needed.  Depression: Continue Zoloft as ordered.  Psych consults as planned/needed. Agitation related to dementia managed with quetiapine and Depakote sprinkles.  DEMENTIA- FAST 7a  Follow up: Palliative care  will continue to follow for complex medical decision making, advance care planning, and clarification of goals. Return 6 weeks or prn. Encouraged to call provider sooner with any concerns.  CHIEF COMPLAINT: Palliative follow up  HISTORY OF PRESENT ILLNESS:  Tammy [name] a 80 y.o. female with multiple medical problems including weakness which improved after completion of PT/OT, Dementia, chronic CHF, hypertension, seizures, generalized anxiety disorder, protein caloric malnutrition, dysphagia. History obtained from review of EMR, discussion with primary team, family and/or patient. Records reviewed and summarized above. All 10 point systems reviewed and are negative except as documented in history of present illness above Review and summarization of Epic records shows history from other than patient.  Palliative Care was asked to follow this patient o help address complex decision making in the context of advance care planning and goals of care clarification.   PHYSICAL EXAM  Height/weight: 5 feet 3 inches/113.8 Ibs up frpm 106 2 months ago.  General: In no acute distress, appropriately dressed Cardiovascular: regular rate and rhythm; no edema in BLE Pulmonary: no cough, no increased work of breathing, normal respiratory effort Abdomen: soft, non tender, no guarding, positive bowel sounds in all quadrants GU:  no suprapubic tenderness Eyes: Normal lids, no discharge ENMT: Moist mucous membranes Musculoskeletal:  ambulatory with assistive device, gets around on wheelchair mostly, moderate sarcopenia Skin: no rash to visible skin, warm without cyanosis,  Psych: non-anxious affect Neurological: Weakness but otherwise non focal Heme/lymph/immuno: no bruises, no bleeding  PERTINENT MEDICATIONS:  Outpatient Encounter Medications as of 04/30/2021  Medication Sig   acetaminophen (TYLENOL) 325 MG tablet Take 1 tablet (325 mg total) by mouth every 6 (six) hours as needed for fever. (Patient taking  differently: Take 650 mg by mouth See  admin instructions. Take 2 tablets (650 mg) by mouth three times daily, may also take 2 tablets (650 mg) every 6 hours as needed for fever)   acetaminophen (TYLENOL) 500 MG tablet Take 500 mg by mouth at bedtime as needed (pain).   carvedilol (COREG) 3.125 MG tablet Take 1 tablet (3.125 mg total) by mouth 2 (two) times daily with a meal. (Patient taking differently: Take 3.125 mg by mouth every morning.)   diclofenac Sodium (VOLTAREN) 1 % GEL Apply 1 application topically in the morning and at bedtime. Right ankle pain   divalproex (DEPAKOTE SPRINKLE) 125 MG capsule Take 125 mg by mouth See admin instructions. Take one capsule (125 mg) by mouth twice daily - 10am and 6pm; take two capsules (250 mg) daily at 2pm   levETIRAcetam (KEPPRA) 250 MG tablet Take 1 tablet (250 mg total) by mouth every morning.   megestrol (MEGACE) 40 MG/ML suspension Take 400 mg by mouth in the morning and at bedtime.   Nutritional Supplements (RESOURCE 2.0 PO) Take 120 mLs by mouth 3 (three) times daily.   OLANZapine (ZYPREXA) 5 MG tablet Take 1 tablet (5 mg total) by mouth at bedtime.   polyethylene glycol (MIRALAX / GLYCOLAX) 17 g packet Take 17 g by mouth every morning.   sertraline (ZOLOFT) 50 MG tablet Take 50 mg by mouth every morning.   No facility-administered encounter medications on file as of 04/30/2021.    HOSPICE ELIGIBILITY/DIAGNOSIS: TBD  PAST MEDICAL HISTORY:  Past Medical History:  Diagnosis Date   CHF (congestive heart failure) (HCC)    CKD (chronic kidney disease)    COPD (chronic obstructive pulmonary disease) (HCC)    Dementia (HCC)    Hypertension    Insomnia      ALLERGIES: No Known Allergies    I spent 50 minutes providing this consultation; this includes time spent with patient/family, chart review and documentation. More than 50% of the time in this consultation was spent on counseling and coordinating communication   Thank you for the opportunity  to participate in the care of Tammy T Aure Please call our office at [phone] if we can be of additional assistance.  Note: Portions of this note were generated with Scientist, clinical (histocompatibility and immunogenetics). Dictation errors may occur despite best attempts at proofreading.  Rosaura Carpenter, NP

## 2021-08-16 ENCOUNTER — Other Ambulatory Visit: Payer: Self-pay

## 2021-08-16 ENCOUNTER — Ambulatory Visit: Payer: Self-pay

## 2021-08-16 ENCOUNTER — Ambulatory Visit (INDEPENDENT_AMBULATORY_CARE_PROVIDER_SITE_OTHER): Payer: Medicare Other | Admitting: Orthopaedic Surgery

## 2021-08-16 DIAGNOSIS — M25571 Pain in right ankle and joints of right foot: Secondary | ICD-10-CM

## 2021-08-16 IMAGING — CT CT HEAD W/O CM
4 of 5 series · 14 of 47 positions shown, 16 images · non-contrast
Comparison: 11/17/2018

CLINICAL DATA: Altered level of consciousness

EXAM:
CT HEAD WITHOUT CONTRAST
TECHNIQUE: Contiguous axial images were obtained from the base of the skull
through the vertex without intravenous contrast.

[Series 3: head without · axial · non-contrast · 0.42mm/px · z∈[+1165,+1225]mm · 3 of 31 slices shown]
[im 7/31  brain]
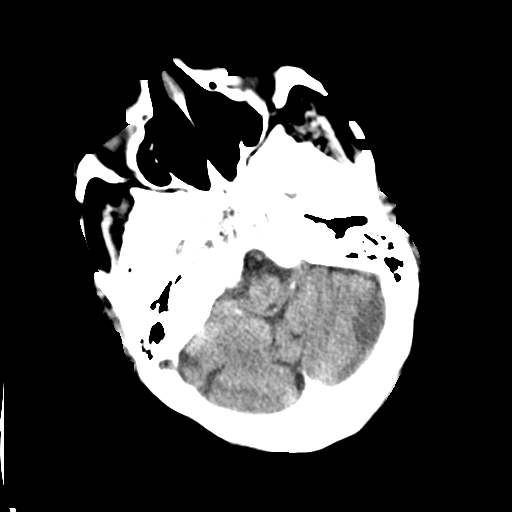
[im 13/31  brain]
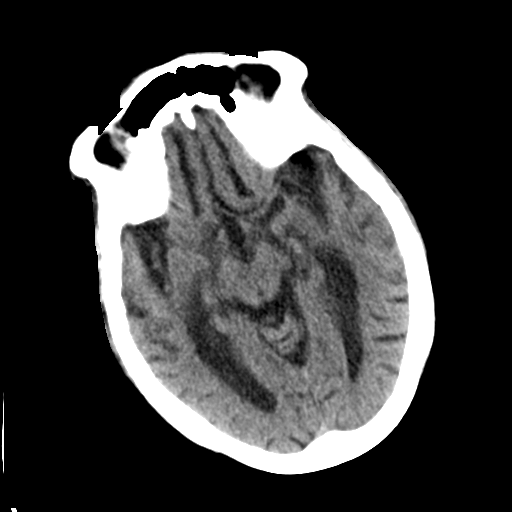
[im 19/31  brain]
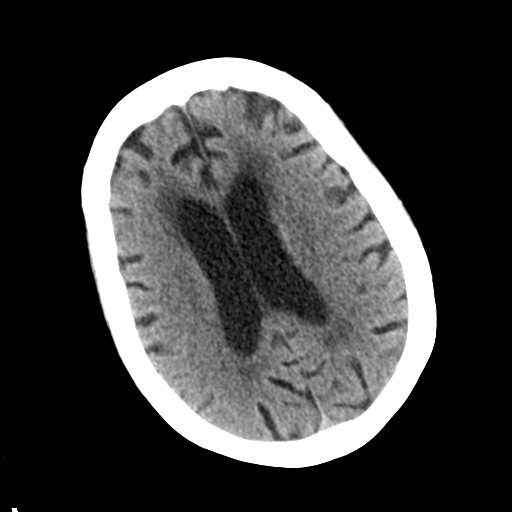

[Series 5: head without cor · coronal · non-contrast · 0.32mm/px · 3 of 67 slices shown]
[im 23/67  brain]
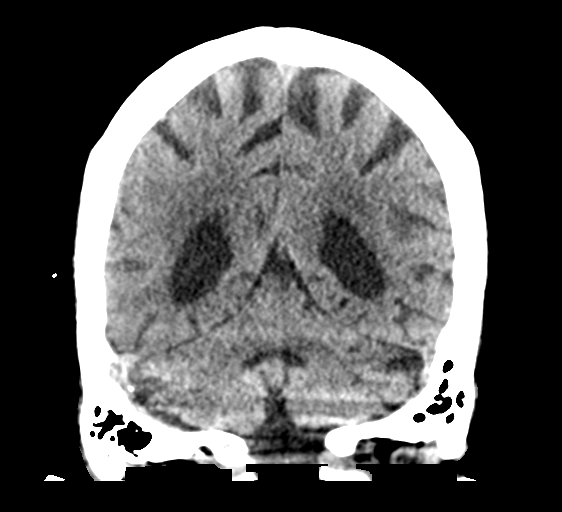
[im 30/67  brain]
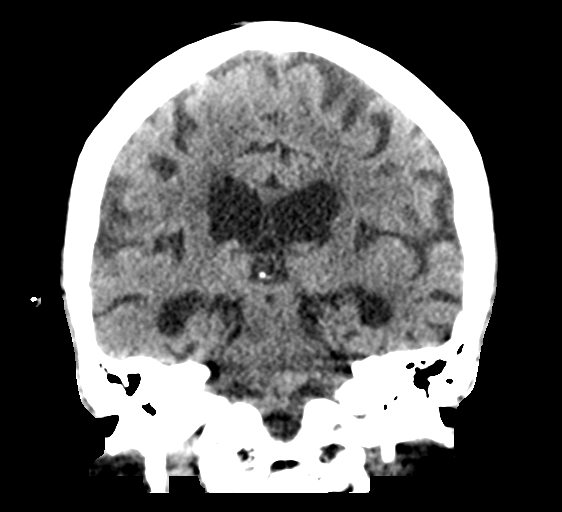
[im 37/67  brain]
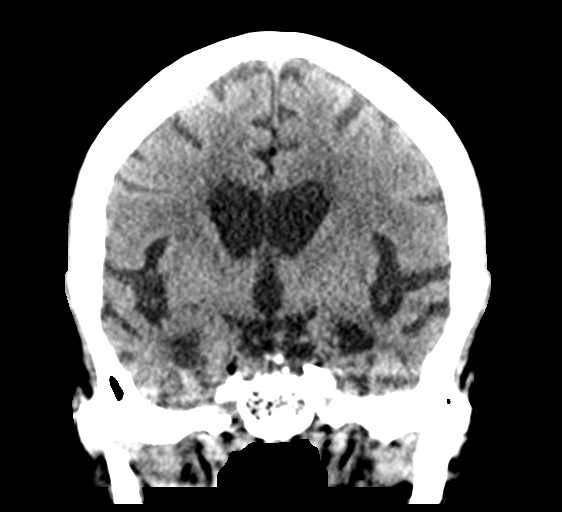

[Series 6: head without sag · sagittal · non-contrast · 0.33mm/px · 3 of 58 slices shown]
[im 20/58  brain]
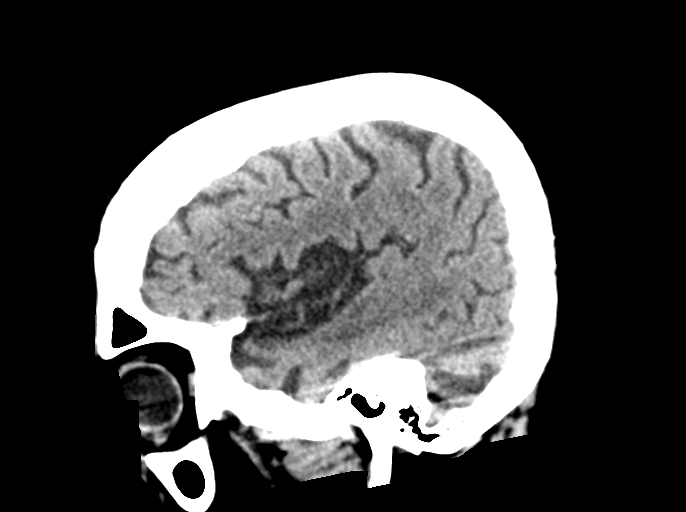
[im 29/58  brain]
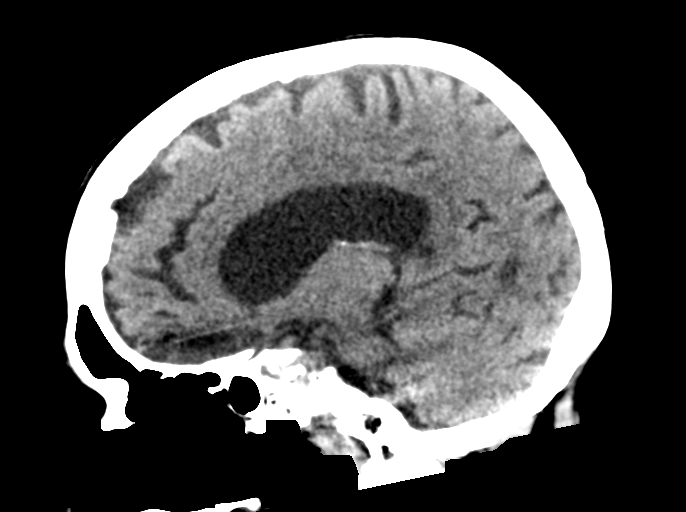
[im 39/58  brain]
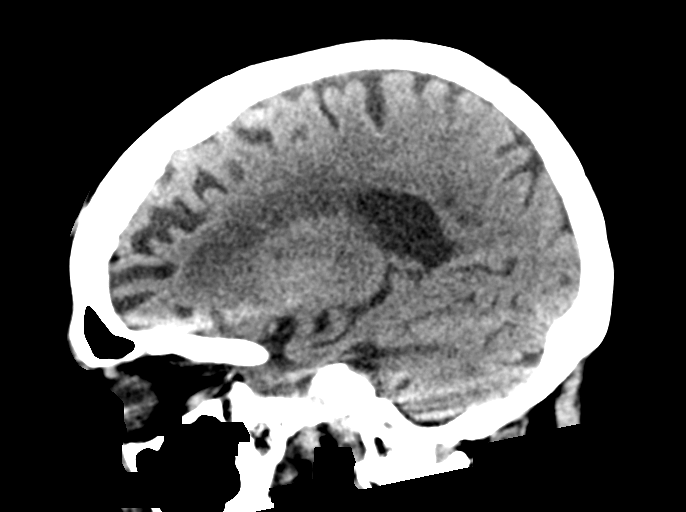

[Series 7: head without ax · axial · non-contrast · 0.34mm/px · z∈[+1190,+1288]mm · 5 of 32 slices shown, 7 images]
[im 6/32  brain]
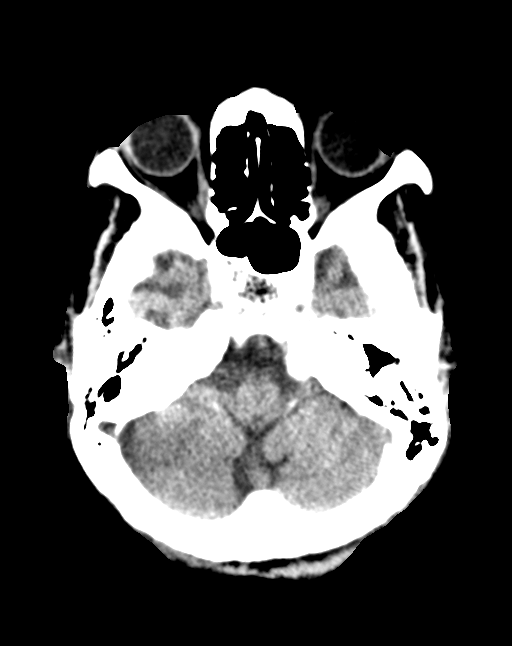
[im 6/32  bone]
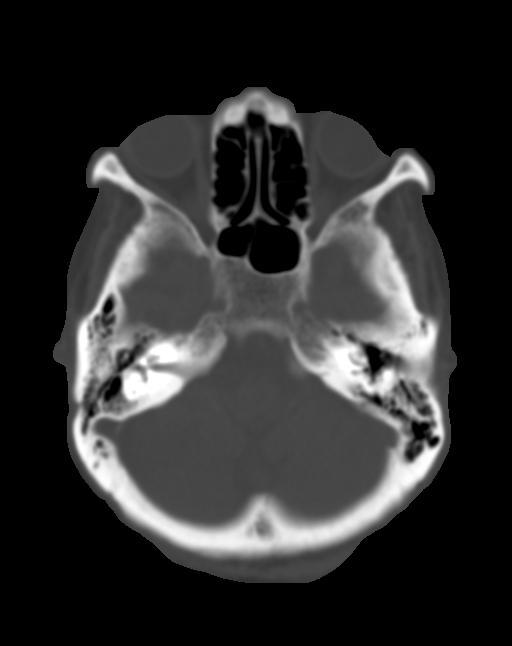
[im 11/32  brain]
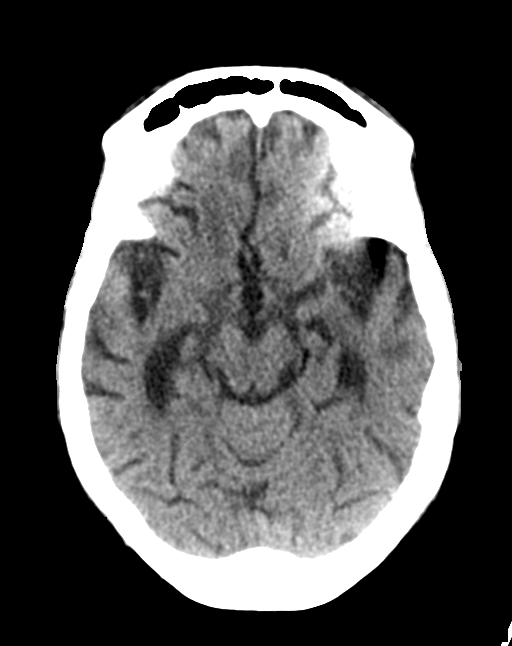
[im 16/32  brain]
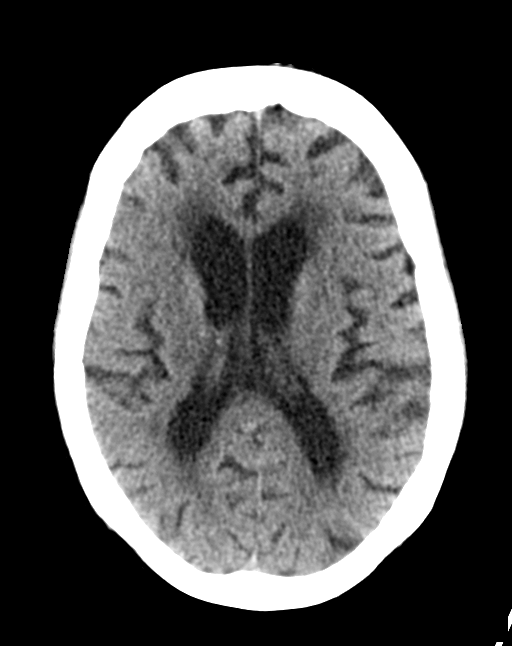
[im 21/32  brain]
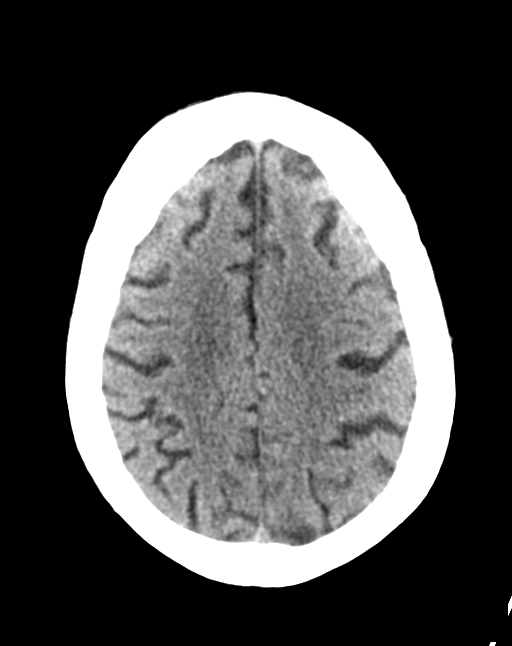
[im 26/32  brain]
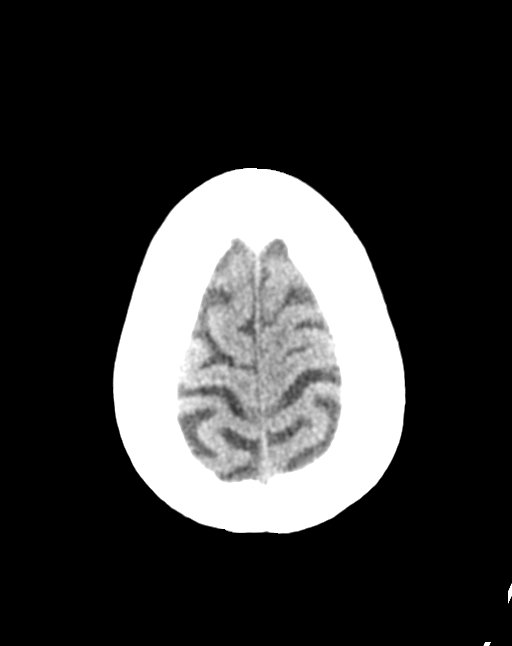
[im 26/32  bone]
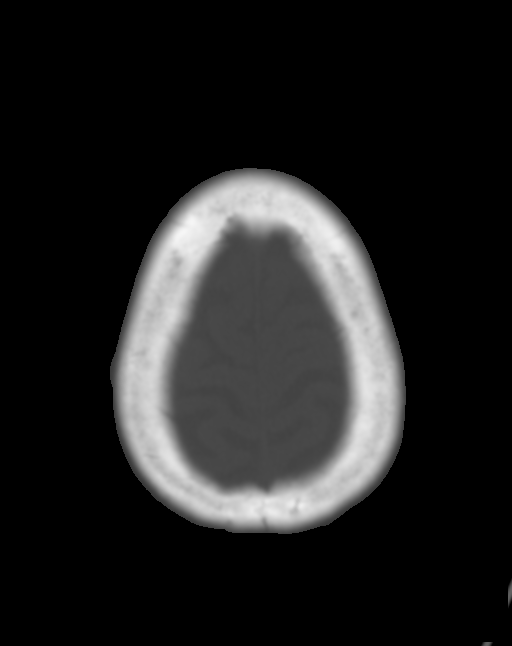

[14 of 47 positions shown; findings below may reference images not displayed]

FINDINGS: Brain: Confluent hypodensities in the periventricular white matter
are most consistent with chronic small vessel ischemic changes. No
signs of acute infarct or hemorrhage. Lateral ventricles and
remaining midline structures are unremarkable. No acute extra-axial
fluid collections. No mass effect.

Vascular: No hyperdense vessel or unexpected calcification.

Skull: Normal. Negative for fracture or focal lesion.

Sinuses/Orbits: No acute finding.

Other: None.
IMPRESSION: 1. Chronic small-vessel ischemic changes in the white matter. No
acute intracranial process.

## 2021-08-16 NOTE — Progress Notes (Signed)
Office Visit Note   Patient: Tammy [name]           Date of Birth: [birth year]           MRN: [id number] Visit Date: 08/16/2021              Requested by: Laurann Montana, MD [phone] [address] [place],  [place] [address] PCP: Laurann Montana, MD   Assessment & Plan: Visit Diagnoses:  1. Pain in right ankle and joints of right foot     Plan: Patient has albumin 3.7 on recent labs.  WBC 11,000.  Hemoglobin 11.2.  Patient will require hardware removal of lateral malleolar plate and screws.  Plain radiographs show no evidence of osteomyelitis.  The interfrag screw that is placed from anterior proximal to distal posterior may not need to be removed.  We will set this up next week and I discussed with her son Casimiro Needle who has power of attorney planned hardware removal with overnight stay and he agrees with outlined plan.  Questions were elicited and answered.  Discussed procedure with patient as well who has dementia.  Discussed applying an incisional VAC and the potential that with peripheral arterial disease, age past smoking history that incision may not heal and at worst case she could end up with an amputation.  She basically just stands to transfer spends most of her time in a wheelchair used ambulate but now has been nonambulatory.  Surgical plan is removal of right distal fibular plate and screws with overnight stay and then return to [facility] the following day.  Follow-Up Instructions: No follow-ups on file.   Orders:  Orders Placed This Encounter  Procedures   XR Ankle Complete Right   No orders of the defined types were placed in this encounter.     Procedures: No procedures performed   Clinical Data: No additional findings.   Subjective: Chief Complaint  Patient presents with   Right Ankle - Pain    HPI 80 year old female stays at [facility] with multiple medical problems including dementia, COPD, heart failure with listed ejection fraction when last  checked on the 25 to 30% range.  She has exposed hardware right lateral ankle from likely fixation done by Dr. Carole Binning I can tell from records possibly in 2001.  Patient has dementia she is here with someone from transportation.  She has a son Casimiro Needle cell phone [phone] and also her daughter Benetta Spar [phone] both of whom are not available when I called them today.  Apparently they did a specially telemed with the vascular and wound consultant Dr. Ron Agee who states with exposed hardware she would need to have hardware removed which is the obvious answer.  Review of Systems former smoker positive for cardiomyopathy.  History of heart failure hypertension seizure disorder on medication.  Previous CVA.  Positive for dementia.   Objective: Vital Signs: There were no vitals taken for this visit.  Physical Exam Constitutional:      Appearance: She is well-developed.     Comments: Pleasant conversant poor memory.  HENT:     Head: Normocephalic.     Right Ear: External ear normal.     Left Ear: External ear normal. There is no impacted cerumen.  Eyes:     Pupils: Pupils are equal, round, and reactive to light.  Neck:     Thyroid: No thyromegaly.     Trachea: No tracheal deviation.  Cardiovascular:     Rate and Rhythm: Normal  rate.  Pulmonary:     Effort: Pulmonary effort is normal.  Abdominal:     Palpations: Abdomen is soft.  Musculoskeletal:     Cervical back: No rigidity.  Skin:    General: Skin is warm and dry.  Neurological:     Mental Status: She is oriented to person, place, and time.  Psychiatric:        Behavior: Behavior normal.    Ortho Exam right lateral ankle shows exposed Synthes plate and screw head.  2 cm opening.  No cellulitis.  Specialty Comments:  No specialty comments available.  Imaging: No results found.   PMFS History: Patient Active Problem List   Diagnosis Date Noted   Pressure injury of skin 11/25/2020   Adult failure to thrive     Palliative care by specialist    DNR (do not resuscitate)    Weakness generalized    Respiratory failure ([place])    Seizure ([place])    Seizure disorder ([place]) 06/04/2018   Status epilepticus ([place])    Acute metabolic encephalopathy    Acute hypercapnic respiratory failure ([place]) 04/01/2018   Acute CHF (congestive heart failure) ([place]) 03/29/2018   CKD (chronic kidney disease) stage 2, GFR 60-89 ml/min [id number]   Complicated UTI (urinary tract infection)    Acute confusional state 09/17/2016   Rhabdomyolysis 07/02/2016   Fall at home 07/01/2016   Lactic acid acidosis 99991111   Metabolic acidosis 99991111   Protein-calorie malnutrition, severe ([place]) 12/01/2015   AKI (acute kidney injury) ([place])    Hyponatremia    Hypothermia    Tachycardia    Hyperkalemia 11/29/2015   Acute renal failure ([place]) 11/29/2015   Chronic combined systolic and diastolic CHF, NYHA class 2 ([place]) 11/29/2015   Mitral regurgitation 11/29/2015   Elevated lactic acid level 11/29/2015   Agitation 11/29/2015   COPD (chronic obstructive pulmonary disease) ([place]) 11/29/2015   Dementia ([place]) 11/29/2015   Confusion    Acute blood loss anemia 08/21/2015   Postoperative anemia due to acute blood loss 08/20/2015   Displaced intertrochanteric fracture of left femur ([place]) 08/19/2015   Closed left hip fracture ([place]) 08/18/2015   Secondary cardiomyopathy ([place])    Acute systolic congestive heart failure, NYHA class 3 ([place]) 03/02/2015   COPD exacerbation ([place]) 03/02/2015   Tobacco abuse 03/02/2015   Severe protein-calorie malnutrition ([place]) 03/02/2015   HTN (hypertension) 03/02/2015   Congestive dilated cardiomyopathy (HCC)    CVA (cerebral infarction) 02/28/2015   Stroke [facility])    Past Medical History:  Diagnosis Date   CHF (congestive heart failure) (HCC)    CKD (chronic kidney disease)    COPD (chronic obstructive pulmonary disease) (HCC)    Dementia ([place])    Hypertension    Insomnia     Family History  Problem  Relation Age of Onset   Congestive Heart Failure Son 3    Past Surgical History:  Procedure Laterality Date   INTRAMEDULLARY (IM) NAIL INTERTROCHANTERIC Left 08/19/2015   Procedure: INTRAMEDULLARY (IM) NAIL INTERTROCHANTRIC;  Surgeon: Dorna Leitz, MD;  Location: [place];  Service: Orthopedics;  Laterality: Left;   Social History   Occupational History   Occupation: retired  Tobacco Use   Smoking status: Every Day    Packs/day: 2.00    Years: 50.00    Pack years: 100.00    Types: Cigarettes   Smokeless tobacco: Never  Vaping Use   Vaping Use: Never used  Substance and Sexual Activity   Alcohol use: No   Drug use: No  Sexual activity: Not on file

## 2021-08-19 NOTE — Progress Notes (Signed)
Anesthesia Chart Review:  Case: [id number] Date/Time: 08/21/21 1651   Procedure: REMOVAL EXPOSED RIGHT ANKLE DISTAL FIBULA PLATE AND SCREWS, POSSIBLE INCISIONAL VAC PREVENA (Right)   Anesthesia type: Choice   Pre-op diagnosis: right ankle exposed hardware   [facility] ROOM 03 / [facility]   Surgeons: [name], MD       DISCUSSION: Patient is an 80 year old female scheduled for the above procedure.  Case was posted 08/19/21 following 08/16/21 office visit with Dr. Ophelia Charter showing exposed right lateral ankle hardware, likely from fixation done in 2001. Per notes, xrays did not show evidence of osteomyelitis. She has dementia and resides at [facility].   History includes smoking, COPD, HTN, CKD (with AKI 11/2015 d/t dehydration; 09/2016 is setting of UTI), CVA (left ACA infarct 02/28/15, s/p tPA, embolic versus large vessel atherosclerosis, patient refused MRI, felt not to be a candidate for anticoagulation due to noncompliance/memory issues), CHF (diagnosed compensated BiV cardiomyopathy, 03/04/15 during CVA admission, EF 30% with diffuse LV hypokinesis, moderate RV dysfunction; acute exacerbation 03/2018), dementia, right ankle fracture (s/p ORIF in [place], s/p revisions/replacement of screws 01/07/00 & 02/04/00), left hip fracture (s/p ORIF with IM nail 08/19/15), seizure (06/04/18, possibly related to hypoglycemia 06/2018).   [place] admission 11/23/20-11/29/20 from SNF due to worsening lethargy and confusion with acute metabolic encephalopathy in setting of chronic dementia. Valproic acid low at 27, WBC 13, Cr 1.5, UA large leukocytes (multiple species on urine culture). Labs with some improvement after IVF and antibiotics, but intermittently agitated with confusion and refusing medications and/or labs at time. Psychiatry and palliative care consults done. Intermittent confusion and agitations known to be at her baseline, so psychiatry did not recommend in-patient psychiatric  hospitalization. Goals of care discussed with patient and son Brantley Naser, "If she suffered cardiac or respiratory arrest she would not want to be resuscitated.  However he does want her well cared for an appropriately treated if she has not arrested." Echo done and showed estimated LVEF 60-65%, no regional wall motion abnormalities, normal RVSF, trivial MR/TR, AV not well visualized.   Patient with exposed right ankle hardware and need surgery. History of CHF dating back to 2016 that is not followed by cardiology, notes suggest non-compliance and memory issues likely a contributing factor. She is on Coreg by med list. EF normalized by 11/2020 echo. It appears she has been a resident in a SNF since ~ 06/2018. Discussed case with anesthesiologist Lowella Petties, DO. Patient will be be evaluated on the day of surgery.     VS: 11/28/20 WT 47.4 KG, BP 159/64, HR 70    PROVIDERS: Laurann Montana, MD is listed as PCP - She is not currently followed by a cardiologist. Last visit 12/02/17 by Lorine Bears, MD for PAD with minor bilateral toe ulcerations but with palpable distal pulses. LE arterial Doppler and referral to wound clinic recommended, as well as smoking cessation. As needed PV cardiology follow-up recommended. Previous visits with Nona Dell, MD on 03/04/15 and Arvilla Meres, MD on 03/02/15 after she was diagnosed with severe LV dysfunction during admission for acute CVA.  Diffuse hypokinesis raised possibility of nonischemic etiology, although could not rule out CAD with certainty and had risk factors.  She was well-compensated at the time. Medical therapy recommended, although initially refused to take medications, at that time with plan for HF Clinic outpatient follow-up.  If EF did not improve would consider ischemic work-up such as cardiac cath. Loop recorder and TEE  not pursued since she was deemed a poor candidate for anticoagulation.    LABS: For day of surgery as indicated.  Currently, last available results include: Lab Results  Component Value Date   WBC 13.1 (H) 11/25/2020   HGB 12.0 11/25/2020   HCT 36.0 11/25/2020   PLT 222 11/25/2020   GLUCOSE 94 11/26/2020   ALT 27 11/23/2020   AST 21 11/23/2020   NA 138 11/26/2020   K 4.9 11/26/2020   CL 110 11/26/2020   CREATININE 1.15 (H) 11/26/2020   BUN 26 (H) 11/26/2020   CO2 19 (L) 11/26/2020   TSH 1.503 11/23/2020     IMAGES: 1V PCXR 11/23/20: FINDINGS: Single frontal view of the chest was obtained with the patient rotated to the right. The cardiac silhouette is unremarkable. No airspace disease, effusion, or pneumothorax. No acute bony abnormalities. IMPRESSION: 1. No acute intrathoracic process.     EKG: 11/23/20: Sinus rhythm Anteroseptal infarct, age indeterminate When compared with ECG of 11/17/2020, No significant change was found Confirmed by Delora Fuel ([id number]) on 11/23/2020 11:53:10 PM   CV: Echo 11/27/20: - Sonographer Comments: Technically difficult study due to poor echo  windows. Image acquisition challenging due to uncooperative patient and  Image acquisition challenging due to respiratory motion.   IMPRESSIONS   1. Left ventricular ejection fraction, by estimation, is 60 to 65%. The  left ventricle has normal function. The left ventricle has no regional  wall motion abnormalities. There is not well visualized left ventricular  hypertrophy. Left ventricular  diastolic function could not be evaluated.   2. Right ventricular systolic function is normal. The right ventricular  size is normal.   3. The mitral valve is grossly normal. Trivial mitral valve  regurgitation. No evidence of mitral stenosis.   4. The aortic valve was not well visualized. Aortic valve regurgitation  is not visualized.   5. The inferior vena cava is normal in size with greater than 50%  respiratory variability, suggesting right atrial pressure of 3 mmHg.   - Comparison(s): Changes from prior study  are noted.  [03/30/18: LVEF 25-30%, grade 2 DD, moderate MR/TR, mild-moderate reduced RVF, PA peak pressure 59 mmHg; 04/02/15: LVEF 40-45%, moderate diffuse LV hypokinesis, grade 2 DD, mild-moderate MR; 02/28/15: LVEF 30%, moderate-severe MR, moderately reduced RV]  - Conclusion(s)/Recommendation(s): Technically challening study due to limited acoustic windows and patient cooperation. Grossly normal LVEF, improved from prior. No focal wall motion abnormalities noted but reduced sensitivity for this based on available images.    Past Medical History:  Diagnosis Date   CHF (congestive heart failure) (HCC)    CKD (chronic kidney disease)    COPD (chronic obstructive pulmonary disease) ([place])    Dementia ([place])    Hypertension    Insomnia    Seizure ([place]) 06/04/2018   possibly related to hypoglycemic event   Stroke ([place]) 02/28/2015    Past Surgical History:  Procedure Laterality Date   INTRAMEDULLARY (IM) NAIL INTERTROCHANTERIC Left 08/19/2015   Procedure: INTRAMEDULLARY (IM) NAIL INTERTROCHANTRIC;  Surgeon: Dorna Leitz, MD;  Location: [place];  Service: Orthopedics;  Laterality: Left;    MEDICATIONS: No current facility-administered medications for this encounter.    acetaminophen (TYLENOL) 325 MG tablet   acetaminophen (TYLENOL) 500 MG tablet   carvedilol (COREG) 3.125 MG tablet   diclofenac Sodium (VOLTAREN) 1 % GEL   divalproex (DEPAKOTE SPRINKLE) 125 MG capsule   levETIRAcetam (KEPPRA) 250 MG tablet   megestrol (MEGACE) 40 MG/ML suspension   Nutritional Supplements (RESOURCE 2.0  PO)   OLANZapine (ZYPREXA) 5 MG tablet   polyethylene glycol (MIRALAX / GLYCOLAX) 17 g packet   sertraline (ZOLOFT) 50 MG tablet    Myra Gianotti, PA-C Surgical Short Stay/Anesthesiology [facility] [place] Phone [phone] [facility] Phone [phone] 08/20/2021 10:38 AM

## 2021-08-20 ENCOUNTER — Other Ambulatory Visit: Payer: Self-pay

## 2021-08-20 ENCOUNTER — Encounter (HOSPITAL_COMMUNITY): Payer: Self-pay | Admitting: Orthopaedic Surgery

## 2021-08-20 NOTE — Anesthesia Preprocedure Evaluation (Addendum)
Anesthesia Evaluation  Patient identified by MRN, date of birth, ID band Patient confused    Reviewed: Allergy & Precautions, NPO status , Patient's Chart, lab work & pertinent test results, reviewed documented beta blocker date and time   Airway Mallampati: III  TM Distance: >3 FB Neck ROM: Full    Dental  (+) Edentulous Upper, Edentulous Lower   Pulmonary COPD, Current Smoker,  100 pack year history    Pulmonary exam normal breath sounds clear to auscultation       Cardiovascular hypertension, Pt. on medications and Pt. on home beta blockers +CHF  Normal cardiovascular exam Rhythm:Regular Rate:Normal  Echo 11/27/20:  IMPRESSIONS  1. Left ventricular ejection fraction, by estimation, is 60 to 65%. The left ventricle has normal function. The left ventricle has no regional wall motion abnormalities. There is not well visualized left ventricular  hypertrophy. Left ventricular diastolic function could not be evaluated.  2. Right ventricular systolic function is normal. The right ventricular size is normal.  3. The mitral valve is grossly normal. Trivial mitral valve  regurgitation. No evidence of mitral stenosis.  4. The aortic valve was not well visualized. Aortic valve regurgitation is not visualized.  5. The inferior vena cava is normal in size with greater than 50% respiratory variability, suggesting right atrial pressure of 3 mmHg.    Neuro/Psych Seizures -, Well Controlled,  PSYCHIATRIC DISORDERS Dementia CVA (2016), Residual Symptoms    GI/Hepatic negative GI ROS, Neg liver ROS,   Endo/Other  negative endocrine ROS  Renal/GU Renal disease  negative genitourinary   Musculoskeletal negative musculoskeletal ROS (+)   Abdominal   Peds  Hematology negative hematology ROS (+)   Anesthesia Other Findings   Reproductive/Obstetrics negative OB ROS                          Anesthesia  Physical Anesthesia Plan  ASA: 3  Anesthesia Plan: General   Post-op Pain Management:    Induction: Intravenous  PONV Risk Score and Plan: 2 and Ondansetron, Dexamethasone and Treatment may vary due to age or medical condition  Airway Management Planned: LMA  Additional Equipment: None  Intra-op Plan:   Post-operative Plan: Extubation in OR  Informed Consent: I have reviewed the patients History and Physical, chart, labs and discussed the procedure including the risks, benefits and alternatives for the proposed anesthesia with the patient or authorized representative who has indicated his/her understanding and acceptance.   Patient has DNR.  Discussed DNR with power of attorney and Continue DNR.   Dental advisory given  Plan Discussed with: CRNA  Anesthesia Plan Comments: (Pt w/ severe dementia. D/w son Hiram Gash- wishes to suspend DNR except continue to withhold chest compression. Okay to intubate, give IV meds and defibrillate. D/w POA possibility of worsening postop dementia )      Anesthesia Quick Evaluation

## 2021-08-20 NOTE — Progress Notes (Signed)
PRE OP INSTRUCTIONS: please review and follow:   Your procedure is scheduled on Wednesday 08/21/21. Please report to [facility] Main Entrance "A" at  2:00PM., and check in at the Admitting office. Call this number if you have problems the morning of surgery: [phone]   Remember: Do not EAT after midnight the night before your surgery  You may drink clear liquids until 2PM the day of surgery Clear liquids allowed are: Water, Non-Citrus Juices (without pulp), Carbonated Beverages, Clear Tea, Black Coffee Only, and Gatorade   Medications to take morning of surgery with a sip of water include: acetaminophen (TYLENOL) - as needed carvedilol (COREG)  divalproex (DEPAKOTE SPRINKLE)  levETIRAcetam (KEPPRA)  sertraline (ZOLOFT)   As of today, STOP taking any Aspirin (unless otherwise instructed by your surgeon), Aleve, Naproxen, Ibuprofen, Motrin, Advil, Goody's, BC's, all herbal medications, fish oil, and all vitamins.    The Morning of Surgery Do not wear jewelry, make-up or nail polish. Do not wear lotions, powders, or perfumes, or deodorant  Do not bring valuables to the hospital. [facility] is not responsible for any belongings or valuables.  If you are a smoker, DO NOT Smoke 24 hours prior to surgery  If you wear a CPAP at night please bring your mask the morning of surgery   Remember that you must have someone to transport you home after your surgery, and remain with you for 24 hours if you are discharged the same day.  Please bring cases for contacts, glasses, hearing aids, dentures or bridgework because it cannot be worn into surgery.   Patients discharged the day of surgery will not be allowed to drive home.   Please shower the NIGHT BEFORE/MORNING OF SURGERY (use antibacterial soap like DIAL soap if possible). Wear comfortable clothes the morning of surgery. Oral Hygiene is also important to reduce your risk of infection.  Remember - BRUSH YOUR TEETH THE MORNING OF  SURGERY WITH YOUR REGULAR TOOTHPASTE  Patient denies shortness of breath, fever, cough and chest pain.

## 2021-08-21 ENCOUNTER — Encounter (HOSPITAL_COMMUNITY): Payer: Self-pay | Admitting: Orthopaedic Surgery

## 2021-08-21 ENCOUNTER — Ambulatory Visit (HOSPITAL_COMMUNITY): Payer: Medicare Other | Admitting: Vascular Surgery

## 2021-08-21 ENCOUNTER — Observation Stay (HOSPITAL_COMMUNITY)
Admission: RE | Admit: 2021-08-21 | Discharge: 2021-08-23 | Disposition: A | Discharge status: Home / Self Care | Payer: Medicare Other | Attending: Orthopaedic Surgery | Admitting: Orthopaedic Surgery

## 2021-08-21 ENCOUNTER — Encounter (HOSPITAL_COMMUNITY)
Admission: RE | Disposition: A | Discharge status: Home / Self Care | Payer: Self-pay | Source: Home / Self Care | Attending: Orthopaedic Surgery

## 2021-08-21 ENCOUNTER — Other Ambulatory Visit: Payer: Self-pay

## 2021-08-21 DIAGNOSIS — M25571 Pain in right ankle and joints of right foot: Principal | ICD-10-CM

## 2021-08-21 DIAGNOSIS — J449 Chronic obstructive pulmonary disease, unspecified: Secondary | ICD-10-CM | POA: Insufficient documentation

## 2021-08-21 DIAGNOSIS — F17210 Nicotine dependence, cigarettes, uncomplicated: Secondary | ICD-10-CM | POA: Insufficient documentation

## 2021-08-21 DIAGNOSIS — I5042 Chronic combined systolic (congestive) and diastolic (congestive) heart failure: Secondary | ICD-10-CM | POA: Diagnosis not present

## 2021-08-21 DIAGNOSIS — Z20822 Contact with and (suspected) exposure to covid-19: Secondary | ICD-10-CM | POA: Diagnosis not present

## 2021-08-21 DIAGNOSIS — I130 Hypertensive heart and chronic kidney disease with heart failure and stage 1 through stage 4 chronic kidney disease, or unspecified chronic kidney disease: Secondary | ICD-10-CM | POA: Insufficient documentation

## 2021-08-21 DIAGNOSIS — N182 Chronic kidney disease, stage 2 (mild): Secondary | ICD-10-CM | POA: Diagnosis not present

## 2021-08-21 DIAGNOSIS — Z9889 Other specified postprocedural states: Secondary | ICD-10-CM

## 2021-08-21 DIAGNOSIS — Z01818 Encounter for other preprocedural examination: Secondary | ICD-10-CM

## 2021-08-21 HISTORY — PX: APPLICATION OF WOUND VAC: SHX5189

## 2021-08-21 HISTORY — PX: HARDWARE REMOVAL: SHX979

## 2021-08-21 LAB — COMPREHENSIVE METABOLIC PANEL
ALT: 16 U/L (ref 0–44)
AST: 25 U/L (ref 15–41)
Albumin: 3 g/dL — ABNORMAL LOW (ref 3.5–5.0)
Alkaline Phosphatase: 55 U/L (ref 38–126)
Anion gap: 8 (ref 5–15)
BUN: 39 mg/dL — ABNORMAL HIGH (ref 8–23)
CO2: 25 mmol/L (ref 22–32)
Calcium: 9.5 mg/dL (ref 8.9–10.3)
Chloride: 114 mmol/L — ABNORMAL HIGH (ref 98–111)
Creatinine, Ser: 1.13 mg/dL — ABNORMAL HIGH (ref 0.44–1.00)
GFR, Estimated: 49 mL/min — ABNORMAL LOW (ref >60)
Glucose, Bld: 90 mg/dL (ref 70–99)
Potassium: 5.2 mmol/L — ABNORMAL HIGH (ref 3.5–5.1)
Sodium: 147 mmol/L — ABNORMAL HIGH (ref 135–145)
Total Bilirubin: 0.4 mg/dL (ref 0.3–1.2)
Total Protein: 7.7 g/dL (ref 6.5–8.1)

## 2021-08-21 LAB — CBC
HCT: 39.7 % (ref 36.0–46.0)
Hemoglobin: 12 g/dL (ref 12.0–15.0)
MCH: 30.3 pg (ref 26.0–34.0)
MCHC: 30.2 g/dL (ref 30.0–36.0)
MCV: 100.3 fL — ABNORMAL HIGH (ref 80.0–100.0)
Platelets: 262 10*3/uL (ref 150–400)
RBC: 3.96 MIL/uL (ref 3.87–5.11)
RDW: 13.6 % (ref 11.5–15.5)
WBC: 12.2 10*3/uL — ABNORMAL HIGH (ref 4.0–10.5)
nRBC: 0 % (ref 0.0–0.2)

## 2021-08-21 LAB — SURGICAL PCR SCREEN
MRSA, PCR: NEGATIVE
Staphylococcus aureus: NEGATIVE

## 2021-08-21 LAB — SARS CORONAVIRUS 2 BY RT PCR (HOSPITAL ORDER, PERFORMED IN ~~LOC~~ HOSPITAL LAB): SARS Coronavirus 2: NEGATIVE

## 2021-08-21 SURGERY — REMOVAL, HARDWARE
Anesthesia: General | Laterality: Right

## 2021-08-21 MED ORDER — POVIDONE-IODINE 10 % EX SWAB
2.0000 "application " | Freq: Once | CUTANEOUS | Status: AC
  Administered 2021-08-21: 2 via TOPICAL

## 2021-08-21 MED ORDER — FENTANYL CITRATE (PF) 250 MCG/5ML IJ SOLN
INTRAMUSCULAR | Status: DC | PRN
  Administered 2021-08-21 (×3): 50 ug via INTRAVENOUS

## 2021-08-21 MED ORDER — METOCLOPRAMIDE HCL 5 MG PO TABS: 5.0000 mg | ORAL_TABLET | Freq: Three times a day (TID) | ORAL | Status: DC | PRN

## 2021-08-21 MED ORDER — PHENYLEPHRINE 40 MCG/ML (10ML) SYRINGE FOR IV PUSH (FOR BLOOD PRESSURE SUPPORT)
PREFILLED_SYRINGE | INTRAVENOUS | Status: AC
  Filled 2021-08-21: qty 10

## 2021-08-21 MED ORDER — MIRTAZAPINE 15 MG PO TABS
7.5000 mg | ORAL_TABLET | Freq: Every day | ORAL | Status: DC
  Administered 2021-08-21 – 2021-08-22 (×2): 7.5 mg via ORAL
  Filled 2021-08-21 (×2): qty 1

## 2021-08-21 MED ORDER — TRAMADOL HCL 50 MG PO TABS: 50.0000 mg | ORAL_TABLET | Freq: Four times a day (QID) | ORAL | Status: DC | PRN

## 2021-08-21 MED ORDER — LIDOCAINE 2% (20 MG/ML) 5 ML SYRINGE
INTRAMUSCULAR | Status: DC | PRN
  Administered 2021-08-21: 60 mg via INTRAVENOUS

## 2021-08-21 MED ORDER — CEFAZOLIN SODIUM-DEXTROSE 2-4 GM/100ML-% IV SOLN
2.0000 g | INTRAVENOUS | Status: AC
  Administered 2021-08-21: 2 g via INTRAVENOUS
  Filled 2021-08-21: qty 100

## 2021-08-21 MED ORDER — DOCUSATE SODIUM 100 MG PO CAPS
100.0000 mg | ORAL_CAPSULE | Freq: Two times a day (BID) | ORAL | Status: DC
  Administered 2021-08-21 – 2021-08-22 (×2): 100 mg via ORAL
  Filled 2021-08-21 (×3): qty 1

## 2021-08-21 MED ORDER — CEFAZOLIN SODIUM-DEXTROSE 2-4 GM/100ML-% IV SOLN: 2.0000 g | INTRAVENOUS | Status: DC

## 2021-08-21 MED ORDER — PHENYLEPHRINE 40 MCG/ML (10ML) SYRINGE FOR IV PUSH (FOR BLOOD PRESSURE SUPPORT)
PREFILLED_SYRINGE | INTRAVENOUS | Status: DC | PRN
  Administered 2021-08-21 (×2): 80 ug via INTRAVENOUS
  Administered 2021-08-21: 120 ug via INTRAVENOUS

## 2021-08-21 MED ORDER — CHLORHEXIDINE GLUCONATE 0.12 % MT SOLN
15.0000 mL | Freq: Once | OROMUCOSAL | Status: AC
  Administered 2021-08-21: 15 mL via OROMUCOSAL
  Filled 2021-08-21: qty 15

## 2021-08-21 MED ORDER — CARVEDILOL 3.125 MG PO TABS: 3.1250 mg | ORAL_TABLET | Freq: Every morning | ORAL | Status: DC

## 2021-08-21 MED ORDER — DEXAMETHASONE SODIUM PHOSPHATE 10 MG/ML IJ SOLN
INTRAMUSCULAR | Status: AC
  Filled 2021-08-21: qty 1

## 2021-08-21 MED ORDER — ONDANSETRON HCL 4 MG/2ML IJ SOLN
INTRAMUSCULAR | Status: AC
  Filled 2021-08-21: qty 2

## 2021-08-21 MED ORDER — ONDANSETRON HCL 4 MG PO TABS: 4.0000 mg | ORAL_TABLET | Freq: Four times a day (QID) | ORAL | Status: DC | PRN

## 2021-08-21 MED ORDER — SODIUM CHLORIDE 0.9 % IV SOLN: INTRAVENOUS | Status: DC

## 2021-08-21 MED ORDER — EPHEDRINE SULFATE-NACL 50-0.9 MG/10ML-% IV SOSY
PREFILLED_SYRINGE | INTRAVENOUS | Status: DC | PRN
  Administered 2021-08-21: 5 mg via INTRAVENOUS
  Administered 2021-08-21: 10 mg via INTRAVENOUS
  Administered 2021-08-21: 5 mg via INTRAVENOUS

## 2021-08-21 MED ORDER — ONDANSETRON HCL 4 MG/2ML IJ SOLN: 4.0000 mg | Freq: Four times a day (QID) | INTRAMUSCULAR | Status: DC | PRN

## 2021-08-21 MED ORDER — ACETAMINOPHEN 500 MG PO TABS
1000.0000 mg | ORAL_TABLET | Freq: Once | ORAL | Status: AC
  Administered 2021-08-21: 1000 mg via ORAL
  Filled 2021-08-21: qty 2

## 2021-08-21 MED ORDER — DIVALPROEX SODIUM 125 MG PO CSDR: 125.0000 mg | DELAYED_RELEASE_CAPSULE | ORAL | Status: DC

## 2021-08-21 MED ORDER — PROSOURCE PLUS PO LIQD
30.0000 mL | Freq: Two times a day (BID) | ORAL | Status: DC
  Administered 2021-08-21 – 2021-08-22 (×2): 30 mL via ORAL
  Filled 2021-08-21 (×5): qty 30

## 2021-08-21 MED ORDER — FENTANYL CITRATE (PF) 100 MCG/2ML IJ SOLN: 25.0000 ug | INTRAMUSCULAR | Status: DC | PRN

## 2021-08-21 MED ORDER — ONDANSETRON HCL 4 MG/2ML IJ SOLN
INTRAMUSCULAR | Status: DC | PRN
  Administered 2021-08-21: 4 mg via INTRAVENOUS

## 2021-08-21 MED ORDER — ORAL CARE MOUTH RINSE: 15.0000 mL | Freq: Once | OROMUCOSAL | Status: AC

## 2021-08-21 MED ORDER — PHENYLEPHRINE HCL-NACL 20-0.9 MG/250ML-% IV SOLN
INTRAVENOUS | Status: DC | PRN
  Administered 2021-08-21: 40 ug/min via INTRAVENOUS

## 2021-08-21 MED ORDER — ONDANSETRON HCL 4 MG/2ML IJ SOLN: 4.0000 mg | Freq: Once | INTRAMUSCULAR | Status: DC | PRN

## 2021-08-21 MED ORDER — POLYETHYLENE GLYCOL 3350 17 G PO PACK
17.0000 g | PACK | Freq: Every morning | ORAL | Status: DC
  Administered 2021-08-22: 09:00:00 17 g via ORAL
  Filled 2021-08-21 (×2): qty 1

## 2021-08-21 MED ORDER — OLANZAPINE 2.5 MG PO TABS
2.5000 mg | ORAL_TABLET | Freq: Every day | ORAL | Status: DC
  Administered 2021-08-21: 2.5 mg via ORAL
  Filled 2021-08-21 (×2): qty 1

## 2021-08-21 MED ORDER — FENTANYL CITRATE (PF) 250 MCG/5ML IJ SOLN
INTRAMUSCULAR | Status: AC
  Filled 2021-08-21: qty 5

## 2021-08-21 MED ORDER — CHLORHEXIDINE GLUCONATE 4 % EX LIQD: 60.0000 mL | Freq: Once | CUTANEOUS | Status: DC

## 2021-08-21 MED ORDER — MAGNESIUM HYDROXIDE 400 MG/5ML PO SUSP
30.0000 mL | Freq: Every day | ORAL | Status: DC | PRN
  Filled 2021-08-21: qty 30

## 2021-08-21 MED ORDER — PROPOFOL 10 MG/ML IV BOLUS
INTRAVENOUS | Status: DC | PRN
  Administered 2021-08-21: 140 mg via INTRAVENOUS

## 2021-08-21 MED ORDER — DEXAMETHASONE SODIUM PHOSPHATE 10 MG/ML IJ SOLN
INTRAMUSCULAR | Status: DC | PRN
  Administered 2021-08-21: 5 mg via INTRAVENOUS

## 2021-08-21 MED ORDER — METOCLOPRAMIDE HCL 5 MG/ML IJ SOLN: 5.0000 mg | Freq: Three times a day (TID) | INTRAMUSCULAR | Status: DC | PRN

## 2021-08-21 MED ORDER — LIDOCAINE 2% (20 MG/ML) 5 ML SYRINGE
INTRAMUSCULAR | Status: AC
  Filled 2021-08-21: qty 5

## 2021-08-21 MED ORDER — CEFAZOLIN SODIUM-DEXTROSE 1-4 GM/50ML-% IV SOLN
1.0000 g | Freq: Three times a day (TID) | INTRAVENOUS | Status: AC
  Administered 2021-08-21 – 2021-08-22 (×2): 1 g via INTRAVENOUS
  Filled 2021-08-21 (×2): qty 50

## 2021-08-21 MED ORDER — LACTATED RINGERS IV SOLN: INTRAVENOUS | Status: DC

## 2021-08-21 MED ORDER — LEVETIRACETAM 250 MG PO TABS
250.0000 mg | ORAL_TABLET | Freq: Every morning | ORAL | Status: DC
  Filled 2021-08-21 (×2): qty 1

## 2021-08-21 MED ORDER — TRAMADOL HCL 50 MG PO TABS
50.0000 mg | ORAL_TABLET | Freq: Two times a day (BID) | ORAL | Status: DC | PRN
  Administered 2021-08-22 – 2021-08-23 (×3): 50 mg via ORAL
  Filled 2021-08-21 (×3): qty 1

## 2021-08-21 MED ORDER — SERTRALINE HCL 50 MG PO TABS
50.0000 mg | ORAL_TABLET | Freq: Every morning | ORAL | Status: DC
  Administered 2021-08-22: 09:00:00 50 mg via ORAL
  Filled 2021-08-21 (×2): qty 1

## 2021-08-21 MED ORDER — DIVALPROEX SODIUM 125 MG PO CSDR
125.0000 mg | DELAYED_RELEASE_CAPSULE | ORAL | Status: DC
  Administered 2021-08-21: 125 mg via ORAL
  Filled 2021-08-21 (×4): qty 1

## 2021-08-21 MED ORDER — DIVALPROEX SODIUM 125 MG PO CSDR
250.0000 mg | DELAYED_RELEASE_CAPSULE | Freq: Every day | ORAL | Status: DC
  Administered 2021-08-22: 16:00:00 250 mg via ORAL
  Filled 2021-08-21 (×2): qty 2

## 2021-08-21 MED ORDER — 0.9 % SODIUM CHLORIDE (POUR BTL) OPTIME
TOPICAL | Status: DC | PRN
  Administered 2021-08-21: 1000 mL

## 2021-08-21 SURGICAL SUPPLY — 54 items
APL SKNCLS STERI-STRIP NONHPOA (GAUZE/BANDAGES/DRESSINGS) ×1
BAG COUNTER SPONGE SURGICOUNT (BAG) ×2 IMPLANT
BAG SPNG CNTER NS LX DISP (BAG) ×1
BANDAGE ESMARK 6X9 LF (GAUZE/BANDAGES/DRESSINGS) IMPLANT
BENZOIN TINCTURE PRP APPL 2/3 (GAUZE/BANDAGES/DRESSINGS) ×1 IMPLANT
BNDG CMPR 9X6 STRL LF SNTH (GAUZE/BANDAGES/DRESSINGS)
BNDG COHESIVE 4X5 TAN ST LF (GAUZE/BANDAGES/DRESSINGS) ×1 IMPLANT
BNDG COHESIVE 4X5 TAN STRL (GAUZE/BANDAGES/DRESSINGS) IMPLANT
BNDG ELASTIC 4X5.8 VLCR STR LF (GAUZE/BANDAGES/DRESSINGS) IMPLANT
BNDG ELASTIC 6X5.8 VLCR STR LF (GAUZE/BANDAGES/DRESSINGS) IMPLANT
BNDG ESMARK 6X9 LF (GAUZE/BANDAGES/DRESSINGS)
BNDG GAUZE ELAST 4 BULKY (GAUZE/BANDAGES/DRESSINGS) ×1 IMPLANT
COVER SURGICAL LIGHT HANDLE (MISCELLANEOUS) ×2 IMPLANT
DRAPE C-ARM 42X72 X-RAY (DRAPES) IMPLANT
DRAPE HALF SHEET 40X57 (DRAPES) IMPLANT
DRAPE INCISE IOBAN 66X45 STRL (DRAPES) IMPLANT
DRAPE ORTHO SPLIT 77X108 STRL (DRAPES)
DRAPE SURG ORHT 6 SPLT 77X108 (DRAPES) IMPLANT
DRESSING PEEL AND PLC PRVNA 13 (GAUZE/BANDAGES/DRESSINGS) IMPLANT
DRSG EMULSION OIL 3X3 NADH (GAUZE/BANDAGES/DRESSINGS) ×1 IMPLANT
DRSG PAD ABDOMINAL 8X10 ST (GAUZE/BANDAGES/DRESSINGS) ×2 IMPLANT
DRSG PEEL AND PLACE PREVENA 13 (GAUZE/BANDAGES/DRESSINGS) ×2
ELECT REM PT RETURN 9FT ADLT (ELECTROSURGICAL) ×2
ELECTRODE REM PT RTRN 9FT ADLT (ELECTROSURGICAL) ×1 IMPLANT
GAUZE SPONGE 4X4 12PLY STRL (GAUZE/BANDAGES/DRESSINGS) ×1 IMPLANT
GLOVE SRG 8 PF TXTR STRL LF DI (GLOVE) ×2 IMPLANT
GLOVE SURG ORTHO LTX SZ7.5 (GLOVE) ×4 IMPLANT
GLOVE SURG UNDER POLY LF SZ8 (GLOVE) ×4
GOWN STRL REUS W/ TWL LRG LVL3 (GOWN DISPOSABLE) ×1 IMPLANT
GOWN STRL REUS W/ TWL XL LVL3 (GOWN DISPOSABLE) ×1 IMPLANT
GOWN STRL REUS W/TWL 2XL LVL3 (GOWN DISPOSABLE) ×2 IMPLANT
GOWN STRL REUS W/TWL LRG LVL3 (GOWN DISPOSABLE) ×2
GOWN STRL REUS W/TWL XL LVL3 (GOWN DISPOSABLE) ×2
KIT BASIN OR (CUSTOM PROCEDURE TRAY) ×2 IMPLANT
KIT DRSG PREVENA PLUS 7DAY 125 (MISCELLANEOUS) ×1 IMPLANT
KIT TURNOVER KIT B (KITS) ×2 IMPLANT
MANIFOLD NEPTUNE II (INSTRUMENTS) ×2 IMPLANT
NS IRRIG 1000ML POUR BTL (IV SOLUTION) ×2 IMPLANT
PACK GENERAL/GYN (CUSTOM PROCEDURE TRAY) ×2 IMPLANT
PAD ARMBOARD 7.5X6 YLW CONV (MISCELLANEOUS) ×4 IMPLANT
PAD CAST 4YDX4 CTTN HI CHSV (CAST SUPPLIES) ×1 IMPLANT
PADDING CAST COTTON 4X4 STRL (CAST SUPPLIES)
SPONGE T-LAP 18X18 ~~LOC~~+RFID (SPONGE) ×1 IMPLANT
STAPLER VISISTAT 35W (STAPLE) ×1 IMPLANT
STOCKINETTE IMPERVIOUS 9X36 MD (GAUZE/BANDAGES/DRESSINGS) IMPLANT
SUT ETHILON 2 0 FS 18 (SUTURE) ×2 IMPLANT
SUT ETHILON 4 0 FS 1 (SUTURE) IMPLANT
SUT VIC AB 0 CT1 27 (SUTURE)
SUT VIC AB 0 CT1 27XBRD ANBCTR (SUTURE) IMPLANT
SUT VIC AB 2-0 CT1 27 (SUTURE)
SUT VIC AB 2-0 CT1 TAPERPNT 27 (SUTURE) IMPLANT
TOWEL GREEN STERILE (TOWEL DISPOSABLE) ×2 IMPLANT
TOWEL GREEN STERILE FF (TOWEL DISPOSABLE) ×2 IMPLANT
WATER STERILE IRR 1000ML POUR (IV SOLUTION) ×1 IMPLANT

## 2021-08-21 NOTE — Op Note (Signed)
Pre and postop diagnosis :exposed right ankle hardware  Procedure: Removal of right lateral malleolar hardware.  Surgeon: Ophelia Charter MD  Anesthesia is LMA.  Tourniquet time less than 30 minutes.  80 year old female with severe dementia stays at [facility] and had fixation of her ankle by Dr. Ollen Gross sometime at the turn of the Century approximately 2001.  She is nonambulatory keeps her leg in external rotation and has skin breakdown with exposed plate and screws without purulent drainage.  She presented to my office on 08/16/2021 and is brought in for hardware removal.  X-ray showed complete healed fibular fracture and Synthes small fragment set with 7 screws.  Procedure: After induction of Ancef prophylaxis standard prepping and draping extremity sheets and drapes were applied timeout procedure was completed calf tourniquet was used.  Patient was hypotensive and despite adequate anesthesia continued to move about.  Some of this may be related to the medications that she takes.  Plate was exposed Synthes screwdriver was used to remove the screws 1 interfrag screw from anterior proximal distal posterior was removed and then the plate was removed.  No obvious purulent material was there.  Aerobic and anaerobic cultures were taken and in the aerobic I used small amount of tissue adjacent to the area that was open.  No osteomyelitis of the bone cortex was hard complete healing.  Copious irrigation and then closure with 2-0 nylon using far near near far stitch in the area of where the skin and spread in the plate it been exposed.  A Prevena small wound VAC was applied and then edges were sealed with Betadine Steri-Drape and then a large piece applied over the top to hopefully help prevent the patient from removing it.  ABD and Coban was wrapped over the top and then extra tape applied to the tubing Prevena VAC had good seal.  Patient transfer the care room will be discharged back to [facility] tomorrow.  She  can follow-up with me in the office next week.  Also follow-up next week Tuesday or Wednesday in my office in [place].  Office phone number is [phone].  Cultures were obtained and will be followed up by me.  If Prevena VAC stops working then it can be turned off and if there is drainage from the wound simple overwrapped with Kerlix and an Ace wrap can be performed until she returns to the office.

## 2021-08-21 NOTE — Transfer of Care (Signed)
Immediate Anesthesia Transfer of Care Note  Patient: Tammy [name]  Procedure(s) Performed: REMOVAL EXPOSED RIGHT ANKLE DISTAL FIBULA PLATE AND SCREWS, INCISIONAL VAC PREVENA (Right) APPLICATION OF WOUND VAC (Right)  Patient Location: PACU  Anesthesia Type:General  Level of Consciousness: sedated  Airway & Oxygen Therapy: Patient connected to face mask oxygen  Post-op Assessment: Report given to RN and Post -op Vital signs reviewed and stable  Post vital signs: Reviewed and stable  Last Vitals:  Vitals Value Taken Time  BP 129/44 08/21/21 1905  Temp    Pulse 84 08/21/21 1904  Resp 11 08/21/21 1906  SpO2 100 % 08/21/21 1904  Vitals shown include unvalidated device data.  Last Pain:  Vitals:   08/21/21 1512  TempSrc: Oral  PainSc:          Complications: No notable events documented.

## 2021-08-21 NOTE — Anesthesia Procedure Notes (Signed)
Procedure Name: LMA Insertion Date/Time: 08/21/2021 5:52 PM Performed by: Arville Lime, CRNA Pre-anesthesia Checklist: Patient identified, Emergency Drugs available, Suction available and Patient being monitored Patient Re-evaluated:Patient Re-evaluated prior to induction Oxygen Delivery Method: Circle System Utilized Preoxygenation: Pre-oxygenation with 100% oxygen Induction Type: IV induction Ventilation: Mask ventilation without difficulty LMA: LMA inserted LMA Size: 4.0 Number of attempts: 1 Airway Equipment and Method: Bite block Placement Confirmation: positive ETCO2 Tube secured with: Tape Dental Injury: Teeth and Oropharynx as per pre-operative assessment

## 2021-08-21 NOTE — H&P (Signed)
Patient: Tammy [name]                                        Date of Birth: 1940-12-19                                                    MRN: [id number] Visit Date: 08/16/2021                                                                     Requested by: Tammy Stains, MD [address],  [place] [address] PCP: Tammy Stains, MD     Assessment & Plan: Visit Diagnoses:  1. Pain in right ankle and joints of right foot       Plan: Patient has albumin 3.7 on recent labs.  WBC 11,000.  Hemoglobin 11.2.  Patient will require hardware removal of lateral malleolar plate and screws.  Plain radiographs show no evidence of osteomyelitis.  The interfrag screw that is placed from anterior proximal to distal posterior may not need to be removed.  We will set this up next week and I discussed with her son Tammy [name] who has power of attorney planned hardware removal with overnight stay and he agrees with outlined plan.  Questions were elicited and answered.  Discussed procedure with patient as well who has dementia.  Discussed applying an incisional VAC and the potential that with peripheral arterial disease, age past smoking history that incision may not heal and at worst case she could end up with an amputation.  She basically just stands to transfer spends most of her time in a wheelchair used ambulate but now has been nonambulatory.  Surgical plan is removal of right distal fibular plate and screws with overnight stay and then return to [facility] the following day.   Follow-Up Instructions: No follow-ups on file.    Orders:     Orders Placed This Encounter  Procedures   XR Ankle Complete Right    No orders of the defined types were placed in this encounter.        Procedures: No procedures performed     Clinical Data: No additional findings.     Subjective:    Chief Complaint  Patient presents with   Right Ankle - Pain      HPI 80 year old  female stays at [facility] with multiple medical problems including dementia, COPD, heart failure with listed ejection fraction when last checked on the 25 to 30% range.  She has exposed hardware right lateral ankle from likely fixation done by Dr. Murvin [name] I can tell from records possibly in 2001.  Patient has dementia she is here with someone from transportation.  She has a son Tammy [name] cell phone [phone] and also her daughter Tammy [name] [phone] both of whom are not available when I called them today.  Apparently they did a specially telemed with the vascular and wound consultant Dr. Vernon [name] who states with exposed hardware she would need to have  hardware removed which is the obvious answer.   Review of Systems former smoker positive for cardiomyopathy.  History of heart failure hypertension seizure disorder on medication.  Previous CVA.  Positive for dementia.     Objective: Vital Signs: There were no vitals taken for this visit.   Physical Exam Constitutional:      Appearance: She is well-developed.     Comments: Pleasant conversant poor memory.  HENT:     Head: Normocephalic.     Right Ear: External ear normal.     Left Ear: External ear normal. There is no impacted cerumen.  Eyes:     Pupils: Pupils are equal, round, and reactive to light.  Neck:     Thyroid: No thyromegaly.     Trachea: No tracheal deviation.  Cardiovascular:     Rate and Rhythm: Normal rate.  Pulmonary:     Effort: Pulmonary effort is normal.  Abdominal:     Palpations: Abdomen is soft.  Musculoskeletal:     Cervical back: No rigidity.  Skin:    General: Skin is warm and dry.  Neurological:     Mental Status: She is oriented to person, place, and time.  Psychiatric:        Behavior: Behavior normal.      Ortho Exam right lateral ankle shows exposed Synthes plate and screw head.  2 cm opening.  No cellulitis.   Specialty Comments:  No specialty comments available.   Imaging: No results  found.     PMFS History:     Patient Active Problem List    Diagnosis Date Noted   Pressure injury of skin 11/25/2020   Adult failure to thrive     Palliative care by specialist     DNR (do not resuscitate)     Weakness generalized     Respiratory failure ([place])     Seizure ([place])     Seizure disorder ([place]) 06/04/2018   Status epilepticus ([place])     Acute metabolic encephalopathy     Acute hypercapnic respiratory failure ([place]) 04/01/2018   Acute CHF (congestive heart failure) ([place]) 03/29/2018   CKD (chronic kidney disease) stage 2, GFR 60-89 ml/min [id number]   Complicated UTI (urinary tract infection)     Acute confusional state 09/17/2016   Rhabdomyolysis 07/02/2016   Fall at home 07/01/2016   Lactic acid acidosis [id number]   Metabolic acidosis 99991111   Protein-calorie malnutrition, severe ([place]) 12/01/2015   AKI (acute kidney injury) ([place])     Hyponatremia     Hypothermia     Tachycardia     Hyperkalemia 11/29/2015   Acute renal failure ([place]) 11/29/2015   Chronic combined systolic and diastolic CHF, NYHA class 2 ([place]) 11/29/2015   Mitral regurgitation 11/29/2015   Elevated lactic acid level 11/29/2015   Agitation 11/29/2015   COPD (chronic obstructive pulmonary disease) ([place]) 11/29/2015   Dementia ([place]) 11/29/2015   Confusion     Acute blood loss anemia 08/21/2015   Postoperative anemia due to acute blood loss 08/20/2015   Displaced intertrochanteric fracture of left femur ([place]) 08/19/2015   Closed left hip fracture ([place]) 08/18/2015   Secondary cardiomyopathy ([place])     Acute systolic congestive heart failure, NYHA class 3 ([place]) 03/02/2015   COPD exacerbation ([place]) 03/02/2015   Tobacco abuse 03/02/2015   Severe protein-calorie malnutrition ([place]) 03/02/2015   HTN (hypertension) 03/02/2015   Congestive dilated cardiomyopathy ([place])     CVA (cerebral infarction) 02/28/2015   Stroke ([place])  Past Medical History:  Diagnosis Date   CHF (congestive heart  failure) (HCC)     CKD (chronic kidney disease)     COPD (chronic obstructive pulmonary disease) (HCC)     Dementia (HCC)     Hypertension     Insomnia           Family History  Problem Relation Age of Onset   Congestive Heart Failure Son 78         Past Surgical History:  Procedure Laterality Date   INTRAMEDULLARY (IM) NAIL INTERTROCHANTERIC Left 08/19/2015    Procedure: INTRAMEDULLARY (IM) NAIL INTERTROCHANTRIC;  Surgeon: Jodi Geralds, MD;  Location: [facility] OR;  Service: Orthopedics;  Laterality: Left;    Social History         Occupational History   Occupation: retired  Tobacco Use   Smoking status: Every Day      Packs/day: 2.00      Years: 50.00      Pack years: 100.00      Types: Cigarettes   Smokeless tobacco: Never  Vaping Use   Vaping Use: Never used  Substance and Sexual Activity   Alcohol use: No   Drug use: No   Sexual activity: Not on file

## 2021-08-21 NOTE — Interval H&P Note (Signed)
History and Physical Interval Note:  08/21/2021 3:49 PM  Tammy [name]  has presented today for surgery, with the diagnosis of right ankle exposed hardware.  The various methods of treatment have been discussed with the patient and family. After consideration of risks, benefits and other options for treatment, the patient has consented to  Procedure(s): REMOVAL EXPOSED RIGHT ANKLE DISTAL FIBULA PLATE AND SCREWS, POSSIBLE INCISIONAL VAC PREVENA (Right) as a surgical intervention.  The patient's history has been reviewed, patient examined, no change in status, stable for surgery.  I have reviewed the patient's chart and labs.  Questions were answered to the patient's satisfaction.     [name]

## 2021-08-21 NOTE — Progress Notes (Signed)
Pt arrived to unit, assessed, informed of POC 

## 2021-08-22 ENCOUNTER — Encounter (HOSPITAL_COMMUNITY): Payer: Self-pay | Admitting: Orthopaedic Surgery

## 2021-08-22 DIAGNOSIS — M25571 Pain in right ankle and joints of right foot: Secondary | ICD-10-CM | POA: Diagnosis not present

## 2021-08-22 LAB — CBC
HCT: 39.6 % (ref 36.0–46.0)
Hemoglobin: 11.8 g/dL — ABNORMAL LOW (ref 12.0–15.0)
MCH: 29.4 pg (ref 26.0–34.0)
MCHC: 29.8 g/dL — ABNORMAL LOW (ref 30.0–36.0)
MCV: 98.8 fL (ref 80.0–100.0)
Platelets: 257 10*3/uL (ref 150–400)
RBC: 4.01 MIL/uL (ref 3.87–5.11)
RDW: 13.5 % (ref 11.5–15.5)
WBC: 11 10*3/uL — ABNORMAL HIGH (ref 4.0–10.5)
nRBC: 0 % (ref 0.0–0.2)

## 2021-08-22 LAB — BASIC METABOLIC PANEL
Anion gap: 9 (ref 5–15)
BUN: 43 mg/dL — ABNORMAL HIGH (ref 8–23)
CO2: 21 mmol/L — ABNORMAL LOW (ref 22–32)
Calcium: 9 mg/dL (ref 8.9–10.3)
Chloride: 112 mmol/L — ABNORMAL HIGH (ref 98–111)
Creatinine, Ser: 1.26 mg/dL — ABNORMAL HIGH (ref 0.44–1.00)
GFR, Estimated: 43 mL/min — ABNORMAL LOW (ref >60)
Glucose, Bld: 155 mg/dL — ABNORMAL HIGH (ref 70–99)
Potassium: 5.4 mmol/L — ABNORMAL HIGH (ref 3.5–5.1)
Sodium: 142 mmol/L (ref 135–145)

## 2021-08-22 MED ORDER — TRAMADOL HCL 50 MG PO TABS: 50.0000 mg | ORAL_TABLET | Freq: Four times a day (QID) | ORAL | 0 refills | Status: AC | PRN

## 2021-08-22 NOTE — NC FL2 (Signed)
[place] MEDICAID FL2 LEVEL OF CARE SCREENING TOOL     IDENTIFICATION  Patient Name: Tammy [name] Birthdate: Mar 11, 1941 Sex: female Admission Date (Current Location): 08/21/2021  [place] and [place] Number:  Haynes Bast [id number] Facility and Address:  [facility], [address] [place], [place] [address]      Provider Number: [id number]  Attending Physician Name and Address:  [name], MD  Relative Name and Phone Number:  Dymphna, Wadley Debby Bud) Son  [phone]    Current Level of Care: Hospital Recommended Level of Care: Skilled Nursing Facility Prior Approval Number:    Date Approved/Denied:   PASRR Number: [id number]  Discharge Plan: SNF    Current Diagnoses: Patient Active Problem List   Diagnosis Date Noted   S/P hardware removal 08/21/2021   Pressure injury of skin 11/25/2020   Adult failure to thrive    Palliative care by specialist    DNR (do not resuscitate)    Weakness generalized    Respiratory failure (HCC)    Seizure (HCC)    Seizure disorder (HCC) 06/04/2018   Status epilepticus (HCC)    Acute metabolic encephalopathy    Acute hypercapnic respiratory failure (HCC) 04/01/2018   Acute CHF (congestive heart failure) (HCC) 03/29/2018   CKD (chronic kidney disease) stage 2, GFR 60-89 ml/min 03/29/2018   Complicated UTI (urinary tract infection)    Acute confusional state 09/17/2016   Rhabdomyolysis 07/02/2016   Fall at home 07/01/2016   Lactic acid acidosis 12/01/2015   Metabolic acidosis 12/01/2015   Protein-calorie malnutrition, severe (HCC) 12/01/2015   AKI (acute kidney injury) (HCC)    Hyponatremia    Hypothermia    Tachycardia    Hyperkalemia 11/29/2015   Acute renal failure (HCC) 11/29/2015   Chronic combined systolic and diastolic CHF, 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/2015   Mitral regurgitation 11/29/2015   Elevated lactic acid level 11/29/2015   Agitation 11/29/2015   COPD (chronic  obstructive pulmonary disease) (HCC) 11/29/2015   Dementia (HCC) 11/29/2015   Confusion    Acute blood loss anemia 08/21/2015   Postoperative anemia due to acute blood loss 08/20/2015   Displaced intertrochanteric fracture of left femur (HCC) 08/19/2015   Closed left hip fracture (HCC) 08/18/2015   Secondary cardiomyopathy (HCC)    Acute systolic congestive heart failure, NYHA class 3 (HCC) 03/02/2015   COPD exacerbation (HCC) 03/02/2015   Tobacco abuse 03/02/2015   Severe protein-calorie malnutrition (HCC) 03/02/2015   HTN (hypertension) 03/02/2015   Congestive dilated cardiomyopathy (HCC)    CVA (cerebral infarction) 02/28/2015   Stroke (HCC)     Orientation RESPIRATION BLADDER Height & Weight     Self  Normal Incontinent Weight: 102 lb (46.3 kg) Height:  5\' 3"  (160 cm)  BEHAVIORAL SYMPTOMS/MOOD NEUROLOGICAL BOWEL NUTRITION STATUS    Convulsions/Seizures Incontinent Diet (see discharge summary)  AMBULATORY STATUS COMMUNICATION OF NEEDS Skin   Total Care Does not communicate Surgical wounds                       Personal Care Assistance Level of Assistance  Total care       Total Care Assistance: Maximum assistance   Functional Limitations Info  Sight, Hearing, Speech Sight Info: Impaired Hearing Info: Impaired Speech Info: Impaired    SPECIAL CARE FACTORS FREQUENCY                       Contractures Contractures Info: Not present  Additional Factors Info  Code Status, Allergies Code Status Info: full Allergies Info: NKA           Current Medications (08/22/2021):  This is the current hospital active medication list Current Facility-Administered Medications  Medication Dose Route Frequency Provider Last Rate Last Admin   (feeding supplement) PROSource Plus liquid 30 mL  30 mL Oral BID Lanae Crumbly, PA-C   30 mL at 08/21/21 2152   0.9 %  sodium chloride infusion   Intravenous Continuous Lanae Crumbly, PA-C 85 mL/hr at 08/22/21 1313 New Bag  at 08/22/21 1313   carvedilol (COREG) tablet 3.125 mg  3.125 mg Oral q morning Benjiman Core M, PA-C       divalproex (DEPAKOTE SPRINKLE) capsule 125 mg  125 mg Oral 2 times per day Lanae Crumbly, PA-C   125 mg at 08/21/21 2106   And   divalproex (DEPAKOTE SPRINKLE) capsule 250 mg  250 mg Oral Q1400 Lanae Crumbly, PA-C       docusate sodium (COLACE) capsule 100 mg  100 mg Oral BID Lanae Crumbly, PA-C   100 mg at 08/21/21 2103   levETIRAcetam (KEPPRA) tablet 250 mg  250 mg Oral q morning Benjiman Core M, PA-C       magnesium hydroxide (MILK OF MAGNESIA) suspension 30 mL  30 mL Oral Daily PRN Benjiman Core M, PA-C       metoCLOPramide (REGLAN) tablet 5-10 mg  5-10 mg Oral Q8H PRN Benjiman Core M, PA-C       Or   metoCLOPramide (REGLAN) injection 5-10 mg  5-10 mg Intravenous Q8H PRN Benjiman Core M, PA-C       mirtazapine (REMERON) tablet 7.5 mg  7.5 mg Oral QHS Benjiman Core M, PA-C   7.5 mg at 08/21/21 2104   OLANZapine (ZYPREXA) tablet 2.5 mg  2.5 mg Oral [id number] Lanae Crumbly, PA-C   2.5 mg at 08/21/21 2106   ondansetron (ZOFRAN) tablet 4 mg  4 mg Oral Q6H PRN Lanae Crumbly, PA-C       Or   ondansetron [facility]) injection 4 mg  4 mg Intravenous Q6H PRN Benjiman Core M, PA-C       polyethylene glycol (MIRALAX / GLYCOLAX) packet 17 g  17 g Oral q morning Lanae Crumbly, PA-C   17 g at 08/22/21 [id number]   sertraline (ZOLOFT) tablet 50 mg  50 mg Oral q morning Lanae Crumbly, PA-C   50 mg at 08/22/21 [id number]   traMADol (ULTRAM) tablet 50 mg  50 mg Oral Q12H PRN Lanae Crumbly, PA-C   50 mg at 08/22/21 [id number]     Discharge Medications: Please see discharge summary for a list of discharge medications.  Relevant Imaging Results:  Relevant Lab Results:   Additional Information [phone]  Joanne Chars, LCSW

## 2021-08-22 NOTE — Progress Notes (Signed)
Report given to [facility] nurse at [facility]. All questions and concerns were fully answered.

## 2021-08-22 NOTE — Evaluation (Signed)
Physical Therapy Evaluation Patient Details Name: Tammy [name] MRN: [id number] DOB: 1941-08-02 Today's Date: 08/22/2021  History of Present Illness  80 yo female s/p removal of R lateral malleolar hardware due to exposed hardware (previous surgery 2001). PMH includes dementia, HF, CKD, COPD, HTN, L intertrochanteric IM nail 2016, 100 pack year smoking history, seizures.  Clinical Impression   Pt presents with debility, impaired cognition given history of dementia, max difficulty performing all mobility tasks, poor to zero balance, and decreased activity tolerance vs anticipated baseline. Pt to benefit from acute PT to address deficits. Pt requiring total +2 for bed level mobility, followed by pivot to chair. RN staff will need to use lift pad for back to bed, NT notified and pad placed for ease of transfer. PT to progress mobility as tolerated, and will continue to follow acutely.       Recommendations for follow up therapy are one component of a multi-disciplinary discharge planning process, led by the attending physician.  Recommendations may be updated based on patient status, additional functional criteria and insurance authorization.  Follow Up Recommendations Skilled nursing-short term rehab (<3 hours/day) (vs custodial care, unsure of pt current level of services at [facility])    Assistance Recommended at Discharge Frequent or constant Supervision/Assistance  Functional Status Assessment Patient has had a recent decline in their functional status and/or demonstrates limited ability to make significant improvements in function in a reasonable and predictable amount of time  Equipment Recommendations  None recommended by PT    Recommendations for Other Services       Precautions / Restrictions Precautions Precautions: Fall Precaution Comments: wound vac Restrictions Weight Bearing Restrictions: Yes RLE Weight Bearing: Non weight bearing      Mobility  Bed  Mobility Overal bed mobility: Needs Assistance Bed Mobility: Supine to Sit     Supine to sit: Total assist;+2 for physical assistance     General bed mobility comments: total +2 for all aspects, pt resistant to mobility initially but benefits from hand-over-hand for calming    Transfers Overall transfer level: Needs assistance   Transfers: Bed to chair/wheelchair/BSC       Squat pivot transfers: Total assist;+2 physical assistance     General transfer comment: total +2 all aspects, and for maintaining NWB RLE.    Ambulation/Gait                  Stairs            Wheelchair Mobility    Modified Rankin (Stroke Patients Only)       Balance Overall balance assessment: Needs assistance Sitting-balance support: Feet supported Sitting balance-Leahy Scale: Poor Sitting balance - Comments: preference for heavy posterior leaning, cues for leaning forward and benefits from min-mod posterior assist to correct Postural control: Posterior lean   Standing balance-Leahy Scale: Zero                               Pertinent Vitals/Pain Pain Assessment: Faces Faces Pain Scale: Hurts even more Pain Location: RLE Pain Descriptors / Indicators: Discomfort;Moaning;Guarding Pain Intervention(s): Limited activity within patient's tolerance;Monitored during session;Repositioned    Home Living Family/patient expects to be discharged to:: Skilled nursing facility                        Prior Function Prior Level of Function : Needs assist  Cognitive Assist : Mobility (cognitive);ADLs (cognitive) Mobility (Cognitive): Step  by step cues ADLs (Cognitive): Step by step cues Physical Assist : Mobility (physical) Mobility (physical): Bed mobility;Transfers ADLs (physical): Toileting;Dressing;Bathing;Feeding;Grooming Mobility Comments: Pt unable to answer questions about PLOF accurately, assuming lift OOB given knee flexion contractures. Pt's wheelchair  in room ADLs Comments: Assume total care for ADLs     Hand Dominance   Dominant Hand: Right    Extremity/Trunk Assessment   Upper Extremity Assessment Upper Extremity Assessment: Defer to OT evaluation    Lower Extremity Assessment Lower Extremity Assessment: Generalized weakness;RLE deficits/detail;LLE deficits/detail RLE Deficits / Details: knee flexion contracture (vs exacerbated by guarding) 90* RLE: Unable to fully assess due to pain LLE Deficits / Details: knee flexion contracture about 458 LLE: Unable to fully assess due to pain    Cervical / Trunk Assessment Cervical / Trunk Assessment: Normal  Communication   Communication: No difficulties  Cognition Arousal/Alertness: Awake/alert Behavior During Therapy: Restless Overall Cognitive Status: History of cognitive impairments - at baseline                                 General Comments: history of dementia, inconsistently follows commands and becomes resistive during mobility. Pt requesting cigarettes and liquor througout session        General Comments      Exercises     Assessment/Plan    PT Assessment Patient needs continued PT services  PT Problem List Decreased mobility;Decreased strength;Decreased safety awareness;Decreased activity tolerance;Decreased balance;Decreased knowledge of use of DME;Pain       PT Treatment Interventions DME instruction;Therapeutic activities;Therapeutic exercise;Patient/family education;Balance training;Functional mobility training    PT Goals (Current goals can be found in the Care Plan section)  Acute Rehab PT Goals PT Goal Formulation: With patient Time For Goal Achievement: 09/05/21 Potential to Achieve Goals: Fair    Frequency Min 2X/week   Barriers to discharge        Co-evaluation PT/OT/SLP Co-Evaluation/Treatment: Yes Reason for Co-Treatment: Necessary to address cognition/behavior during functional activity;To address functional/ADL  transfers;For patient/therapist safety PT goals addressed during session: Mobility/safety with mobility;Balance         AM-PAC PT "6 Clicks" Mobility  Outcome Measure Help needed turning from your back to your side while in a flat bed without using bedrails?: Total Help needed moving from lying on your back to sitting on the side of a flat bed without using bedrails?: Total Help needed moving to and from a bed to a chair (including a wheelchair)?: Total Help needed standing up from a chair using your arms (e.g., wheelchair or bedside chair)?: Total Help needed to walk in hospital room?: Total Help needed climbing 3-5 steps with a railing? : Total 6 Click Score: 6    End of Session   Activity Tolerance: Patient tolerated treatment well Patient left: in chair;with call bell/phone within reach;with chair alarm set;Other (comment) (lift pad placed) Nurse Communication: Mobility status;Need for lift equipment (secure chat RN, notifed NT in person) PT Visit Diagnosis: Other abnormalities of gait and mobility (R26.89)    Time: 3536-1443 PT Time Calculation (min) (ACUTE ONLY): 17 min   Charges:   PT Evaluation $PT Eval Low Complexity: 1 Low         Sibyl Mikula S, PT DPT Acute Rehabilitation Services Pager [phone]  Office [phone]   Katiria Calame E Christain [place] 08/22/2021, 9:53 AM

## 2021-08-22 NOTE — TOC Initial Note (Signed)
Transition of Care [facility]) - Initial/Assessment Note    Patient Details  Name: Tammy [name] MRN: [id number] Date of Birth: Jul 24, 1941  Transition of Care [facility]) CM/SW Contact:    Joanne Chars, LCSW Phone Number: 08/22/2021, 3:13 PM  Clinical Narrative:   Pt unable to participate in assessment.  CSW  spoke with son [name] by phone.  Son confirms pt is in long term care at [facility] and he is planning for her to return there.  CSW discussed that MD has cleared pt for return, waiting on response from [facility].  CSW sent referral to [facility], also called and left message with  admissions.  Waiting on response.               Expected Discharge Plan: Long Term Nursing Home Barriers to Discharge: No Barriers Identified (waiting for response from [facility])   Patient Goals and CMS Choice   CMS Medicare.gov Compare Post Acute Care list provided to::  (NA-son wants pt to return to [facility])    Expected Discharge Plan and Services Expected Discharge Plan: [place] Acute Care Choice: [place] arrangements for the past 2 months: [place]                                      Prior Living Arrangements/Services Living arrangements for the past 2 months: [place] Lives with:: Facility Resident Patient language and need for interpreter reviewed:: No        Need for Family Participation in Patient Care: Yes (Comment) Care giver support system in place?: Yes (comment) Current home services: Other (comment) (na) Criminal Activity/Legal Involvement Pertinent to Current Situation/Hospitalization: No - Comment as needed  Activities of Daily Living Home Assistive Devices/Equipment: Wheelchair, Blood pressure cuff, Grab bars around toilet, Grab bars in shower ADL Screening (condition at time of admission) Patient's cognitive ability adequate to safely complete daily activities?:  No Is the patient deaf or have difficulty hearing?: No Does the patient have difficulty seeing, even when wearing glasses/contacts?: Yes Does the patient have difficulty concentrating, remembering, or making decisions?: Yes Patient able to express need for assistance with ADLs?: Yes Does the patient have difficulty dressing or bathing?: Yes Independently performs ADLs?: No Communication: Needs assistance Is this a change from baseline?: Pre-admission baseline Dressing (OT): Needs assistance Is this a change from baseline?: Pre-admission baseline Grooming: Needs assistance Is this a change from baseline?: Pre-admission baseline Feeding: Independent Bathing: Needs assistance Is this a change from baseline?: Pre-admission baseline Toileting: Needs assistance Is this a change from baseline?: Pre-admission baseline In/Out Bed: Needs assistance Is this a change from baseline?: Pre-admission baseline Walks in Home: Needs assistance Is this a change from baseline?: Pre-admission baseline Does the patient have difficulty walking or climbing stairs?: Yes Weakness of Legs: Both Weakness of Arms/Hands: Both  Permission Sought/Granted                  Emotional Assessment Appearance:: Appears older than stated age Attitude/Demeanor/Rapport: Unable to Assess Affect (typically observed): Unable to Assess Orientation: : Oriented to Self Alcohol / Substance Use: Other (comment) Psych Involvement: No (comment)  Admission diagnosis:  S/P hardware removal [Z98.890] Patient Active Problem List   Diagnosis Date Noted   S/P hardware removal 08/21/2021   Pressure injury of skin 11/25/2020   Adult failure to thrive  Palliative care by specialist    DNR (do not resuscitate)    Weakness generalized    Respiratory failure (HCC)    Seizure (HCC)    Seizure disorder (HCC) 06/04/2018   Status epilepticus (HCC)    Acute metabolic encephalopathy    Acute hypercapnic respiratory failure (HCC)  04/01/2018   Acute CHF (congestive heart failure) (HCC) 03/29/2018   CKD (chronic kidney disease) stage 2, GFR 60-89 ml/min 03/29/2018   Complicated UTI (urinary tract infection)    Acute confusional state 09/17/2016   Rhabdomyolysis 07/02/2016   Fall at home 07/01/2016   Lactic acid acidosis 12/01/2015   Metabolic acidosis 12/01/2015   Protein-calorie malnutrition, severe (HCC) 12/01/2015   AKI (acute kidney injury) (HCC)    Hyponatremia    Hypothermia    Tachycardia    Hyperkalemia 11/29/2015   Acute renal failure (HCC) 11/29/2015   Chronic combined systolic and diastolic CHF, 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/2015   Mitral regurgitation 11/29/2015   Elevated lactic acid level 11/29/2015   Agitation 11/29/2015   COPD (chronic obstructive pulmonary disease) (HCC) 11/29/2015   Dementia (HCC) 11/29/2015   Confusion    Acute blood loss anemia 08/21/2015   Postoperative anemia due to acute blood loss 08/20/2015   Displaced intertrochanteric fracture of left femur (HCC) 08/19/2015   Closed left hip fracture (HCC) 08/18/2015   Secondary cardiomyopathy (HCC)    Acute systolic congestive heart failure, NYHA class 3 (HCC) 03/02/2015   COPD exacerbation (HCC) 03/02/2015   Tobacco abuse 03/02/2015   Severe protein-calorie malnutrition (HCC) 03/02/2015   HTN (hypertension) 03/02/2015   Congestive dilated cardiomyopathy (HCC)    CVA (cerebral infarction) 02/28/2015   Stroke [facility])    PCP:  Laurann Montana, MD Pharmacy:  No Pharmacies Listed    Social Determinants of Health (SDOH) Interventions    Readmission Risk Interventions No flowsheet data found.

## 2021-08-22 NOTE — Progress Notes (Signed)
Discussed with son Tammy [name] Legal guardian that she is going back to [facility] . Just talked with him.

## 2021-08-22 NOTE — TOC Transition Note (Signed)
Transition of Care [facility]) - CM/SW Discharge Note   Patient Details  Name: Tammy [name] MRN: [id number] Date of Birth: [birth year]  Transition of Care [facility]) CM/SW Contact:  Lorri Frederick, LCSW Phone Number: 08/22/2021, 3:53 PM   Clinical Narrative:   Pt discharging to [facility].  RN call [phone] for report.     Final next level of care: Skilled Nursing Facility Barriers to Discharge: No Barriers Identified (waiting for response from [facility])   Patient Goals and CMS Choice   CMS Medicare.gov Compare Post Acute Care list provided to::  (NA-son wants pt to return to [facility])    Discharge Placement              Patient chooses bed at:  [facility]) Patient to be transferred to facility by: [name] Name of family member notified: son Casimiro Needle Patient and family notified of of transfer: 08/22/21  Discharge Plan and Services     Post Acute Care Choice: Skilled Nursing Facility                               Social Determinants of Health (SDOH) Interventions     Readmission Risk Interventions No flowsheet data found.

## 2021-08-22 NOTE — Discharge Summary (Signed)
Patient ID: Tammy [name] MRN: [id number] DOB/AGE: 1940/12/04 80 y.o.  Admit date: 08/21/2021 Discharge date: 08/22/2021  Admission Diagnoses:  Active Problems:   S/P hardware removal   Discharge Diagnoses:  Active Problems:   S/P hardware removal  status post Procedure(s): REMOVAL EXPOSED RIGHT ANKLE DISTAL FIBULA PLATE AND SCREWS, INCISIONAL VAC PREVENA APPLICATION OF WOUND VAC  Past Medical History:  Diagnosis Date   CHF (congestive heart failure) (HCC)    CKD (chronic kidney disease)    COPD (chronic obstructive pulmonary disease) (HCC)    Dementia (HCC)    Hypertension    Insomnia    Seizure (HCC) 06/04/2018   possibly related to hypoglycemic event   Stroke (HCC) 02/28/2015    Surgeries: Procedure(s): REMOVAL EXPOSED RIGHT ANKLE DISTAL FIBULA PLATE AND SCREWS, INCISIONAL VAC PREVENA APPLICATION OF WOUND VAC on 08/21/2021   Consultants:   Discharged Condition: Improved  Hospital Course: [name] is an 80 y.o. female who was admitted 08/21/2021 for operative treatment of failed hardware right ankle.  Patient failed conservative treatments (please see the history and physical for the specifics) and had severe unremitting pain that affects sleep, daily activities and work/hobbies. After pre-op clearance, the patient was taken to the operating room on 08/21/2021 and underwent  Procedure(s): REMOVAL EXPOSED RIGHT ANKLE DISTAL FIBULA PLATE AND SCREWS, INCISIONAL VAC PREVENA APPLICATION OF WOUND VAC.    Patient was given perioperative antibiotics:  Anti-infectives (From admission, onward)    Start     Dose/Rate Route Frequency Ordered Stop   08/22/21 0600  ceFAZolin (ANCEF) IVPB 2g/100 mL premix        2 g 200 mL/hr over 30 Minutes Intravenous On call to O.R. 08/21/21 1414 08/22/21 0500   08/22/21 0600  ceFAZolin (ANCEF) IVPB 2g/100 mL premix  Status:  Discontinued        2 g 200 mL/hr over 30 Minutes Intravenous On call to O.R. 08/21/21 1414 08/21/21  1424   08/21/21 2200  ceFAZolin (ANCEF) IVPB 1 g/50 mL premix        1 g 100 mL/hr over 30 Minutes Intravenous Every 8 hours 08/21/21 2009 08/22/21 0530        Patient was given sequential compression devices and early ambulation to prevent DVT.   Patient benefited maximally from hospital stay and there were no complications. At the time of discharge, the patient was urinating/moving their bowels without difficulty, tolerating a regular diet, pain is controlled with oral pain medications and they have been cleared by PT/OT.   Recent vital signs: Patient Vitals for the past 24 hrs:  BP Temp Temp src Pulse Resp SpO2 Height Weight  08/22/21 0804 (!) 130/59 99 F (37.2 C) Oral (!) 56 17 (!) 85 % -- --  08/22/21 0406 (!) 120/48 97.8 F (36.6 C) Oral -- 17 -- -- --  08/22/21 0013 (!) 127/58 98 F (36.7 C) Oral (!) 103 14 96 % -- --  08/21/21 2016 (!) 158/65 98 F (36.7 C) Oral (!) 113 16 99 % -- --  08/21/21 1945 (!) 129/56 98.7 F (37.1 C) -- (!) 105 (!) 24 99 % -- --  08/21/21 1930 (!) 145/89 -- -- (!) 102 16 95 % -- --  08/21/21 1915 (!) 162/76 -- -- 91 12 100 % -- --  08/21/21 1905 (!) 129/44 (!) 97 F (36.1 C) -- 84 17 100 % -- --  08/21/21 1512 135/69 97.7 F (36.5 C) Oral 89 18 98 % 5\' 3"  (1.6 m)  46.3 kg     Recent laboratory studies:  Recent Labs    08/21/21 1515 08/22/21 0232  WBC 12.2* 11.0*  HGB 12.0 11.8*  HCT 39.7 39.6  PLT 262 257  NA 147* 142  K 5.2* 5.4*  CL 114* 112*  CO2 25 21*  BUN 39* 43*  CREATININE 1.13* 1.26*  GLUCOSE 90 155*  CALCIUM 9.5 9.0     Discharge Medications:   Allergies as of 08/22/2021   No Known Allergies      Medication List     TAKE these medications    acetaminophen 500 MG tablet Commonly known as: TYLENOL Take 500 mg by mouth in the morning, at noon, and at bedtime. Take 1 tablet (500 mg) by mouth 3 times daily scheduled (0900, 1800 & 2100) & may take an additional tablet (500 mg) by mouth at bedtime if needed for  pain.   carvedilol 3.125 MG tablet Commonly known as: COREG Take 1 tablet (3.125 mg total) by mouth 2 (two) times daily with a meal. What changed:  when to take this additional instructions   divalproex 125 MG capsule Commonly known as: DEPAKOTE SPRINKLE Take 125-250 mg by mouth See admin instructions. Take 1 capsule (125 mg) by mouth at 0900, take 2 capsules (250 mg) by mouth at 1400 & take 1 capsule (125 mg) by mouth at 1800.   feeding supplement (PRO-STAT SUGAR FREE 64) Liqd Take 30 mLs by mouth in the morning and at bedtime. (0930 & 1730)   levETIRAcetam 250 MG tablet Commonly known as: KEPPRA Take 1 tablet (250 mg total) by mouth every morning.   magnesium hydroxide 400 MG/5ML suspension Commonly known as: MILK OF MAGNESIA Take 30 mLs by mouth daily as needed for mild constipation.   mirtazapine 7.5 MG tablet Commonly known as: REMERON Take 7.5 mg by mouth at bedtime.   OLANZapine 2.5 MG tablet Commonly known as: ZYPREXA Take 2.5 mg by mouth daily at 6 PM. (1700)   polyethylene glycol 17 g packet Commonly known as: MIRALAX / GLYCOLAX Take 17 g by mouth every morning.   sertraline 50 MG tablet Commonly known as: ZOLOFT Take 50 mg by mouth every morning.   traMADol 50 MG tablet Commonly known as: ULTRAM Take 1 tablet (50 mg total) by mouth every 6 (six) hours as needed for moderate pain.        Diagnostic Studies: No results found.     Follow-up Information     [name], MD. Schedule an appointment as soon as possible for a visit on 08/28/2021.   Specialty: Orthopedic Surgery Why: call office to schedule appointment with Dr Tammy [name] to be seen next week. Contact information: [address] [place] [address] [phone]                 Discharge Plan:  discharge to [facility]  Disposition:     Signed: Zonia Kief for Tammy Greening MD [facility] [phone] 08/22/2021, 12:52 PM

## 2021-08-22 NOTE — Discharge Instructions (Signed)
Leave Preveena wound vac on until return office visit with Dr Ophelia Charter 28 Aug 2021.    Call office [phone] if any questions.

## 2021-08-22 NOTE — Evaluation (Signed)
Occupational Therapy Evaluation Patient Details Name: Tammy [name] MRN: [id number] DOB: [birth year] Today's Date: 08/22/2021   History of Present Illness 80 yo female s/p removal of R lateral malleolar hardware due to exposed hardware (previous surgery 2001). PMH includes dementia, HF, CKD, COPD, HTN, L intertrochanteric IM nail 2016, 100 pack year smoking history, seizures.   Clinical Impression   Patient with baseline cognitive impairment.  Admitted for the procedure above.  PTA she resides at a local SNF.  OT assumes total care for ADL completion at bedlevel, hoyer to wheelchair and wheelchair for mobility.  No acute skilled OT needs.  Recommend return to SNF for long term care.        Recommendations for follow up therapy are one component of a multi-disciplinary discharge planning process, led by the attending physician.  Recommendations may be updated based on patient status, additional functional criteria and insurance authorization.   Follow Up Recommendations  Long-term institutional care without follow-up therapy    Assistance Recommended at Discharge Frequent or constant Supervision/Assistance  Functional Status Assessment  Patient has not had a recent decline in their functional status  Equipment Recommendations  None recommended by OT    Recommendations for Other Services       Precautions / Restrictions Precautions Precautions: Fall Precaution Comments: wound vac Restrictions Weight Bearing Restrictions: Yes RLE Weight Bearing: Non weight bearing      Mobility Bed Mobility Overal bed mobility: Needs Assistance Bed Mobility: Supine to Sit     Supine to sit: Total assist;+2 for physical assistance     General bed mobility comments: total +2 for all aspects, pt resistant to mobility initially but benefits from hand-over-hand for calming    Transfers Overall transfer level: Needs assistance   Transfers: Bed to chair/wheelchair/BSC     Squat pivot  transfers: Total assist;+2 physical assistance       General transfer comment: total +2 all aspects, and for maintaining NWB RLE.      Balance Overall balance assessment: Needs assistance Sitting-balance support: Feet supported Sitting balance-Leahy Scale: Poor Sitting balance - Comments: preference for heavy posterior leaning, cues for leaning forward and benefits from min-mod posterior assist to correct Postural control: Posterior lean   Standing balance-Leahy Scale: Zero                             ADL either performed or assessed with clinical judgement   ADL Overall ADL's : At baseline                                             Vision   Vision Assessment?: No apparent visual deficits     Perception Perception Perception: Not tested   Praxis Praxis Praxis: Not tested    Pertinent Vitals/Pain Pain Assessment: Faces Faces Pain Scale: Hurts little more Pain Location: RLE Pain Descriptors / Indicators: Discomfort;Moaning;Guarding Pain Intervention(s): Monitored during session     Hand Dominance Right   Extremity/Trunk Assessment Upper Extremity Assessment Upper Extremity Assessment: Overall WFL for tasks assessed   Lower Extremity Assessment Lower Extremity Assessment: Defer to PT evaluation RLE Deficits / Details: knee flexion contracture (vs exacerbated by guarding) 90* RLE: Unable to fully assess due to pain LLE Deficits / Details: knee flexion contracture about 458 LLE: Unable to fully assess due to pain   Cervical /  Trunk Assessment Cervical / Trunk Assessment: Normal   Communication Communication Communication: No difficulties   Cognition Arousal/Alertness: Awake/alert Behavior During Therapy: Restless Overall Cognitive Status: History of cognitive impairments - at baseline                                 General Comments: history of dementia, inconsistently follows commands and becomes resistive  during mobility. Pt requesting cigarettes and liquor througout session                      Home Living Family/patient expects to be discharged to:: Skilled nursing facility                                        Prior Functioning/Environment Prior Level of Function : Needs assist  Cognitive Assist : Mobility (cognitive);ADLs (cognitive) Mobility (Cognitive): Step by step cues ADLs (Cognitive): Step by step cues Physical Assist : Mobility (physical) Mobility (physical): Bed mobility;Transfers ADLs (physical): Toileting;Dressing;Bathing;Feeding;Grooming Mobility Comments: Pt unable to answer questions about PLOF accurately, assuming lift OOB given knee flexion contractures. Pt's wheelchair in room ADLs Comments: Assume total care for ADLs        OT Problem List: Decreased range of motion;Impaired balance (sitting and/or standing);Decreased cognition      OT Treatment/Interventions:      OT Goals(Current goals can be found in the care plan section) Acute Rehab OT Goals Patient Stated Goal: none stated OT Goal Formulation: Patient unable to participate in goal setting Time For Goal Achievement: 08/29/21 Potential to Achieve Goals: Good  OT Frequency:     Barriers to D/C:  None noted          Co-evaluation   Reason for Co-Treatment: Necessary to address cognition/behavior during functional activity;To address functional/ADL transfers;For patient/therapist safety PT goals addressed during session: Mobility/safety with mobility;Balance        AM-PAC OT "6 Clicks" Daily Activity     Outcome Measure Help from another person eating meals?: A Lot Help from another person taking care of personal grooming?: A Lot Help from another person toileting, which includes using toliet, bedpan, or urinal?: Total Help from another person bathing (including washing, rinsing, drying)?: Total Help from another person to put on and taking off regular upper body  clothing?: A Lot Help from another person to put on and taking off regular lower body clothing?: Total 6 Click Score: 9   End of Session Nurse Communication: Need for lift equipment  Activity Tolerance: Other (comment) (limited by cognition) Patient left: in chair;with call bell/phone within reach;with chair alarm set  OT Visit Diagnosis: Pain Pain - Right/Left: Right Pain - part of body: Ankle and joints of foot                Time: 8527-7824 OT Time Calculation (min): 20 min Charges:  OT General Charges $OT Visit: 1 Visit OT Evaluation $OT Eval Moderate Complexity: 1 Mod  08/22/2021  RP, OTR/L  Acute Rehabilitation Services  Office:  [phone]   Tammy [name] 08/22/2021, 10:19 AM

## 2021-08-22 NOTE — Plan of Care (Signed)

## 2021-08-23 DIAGNOSIS — M25571 Pain in right ankle and joints of right foot: Secondary | ICD-10-CM | POA: Diagnosis not present

## 2021-08-23 LAB — CBC
HCT: 30.4 % — ABNORMAL LOW (ref 36.0–46.0)
Hemoglobin: 9.3 g/dL — ABNORMAL LOW (ref 12.0–15.0)
MCH: 29.3 pg (ref 26.0–34.0)
MCHC: 30.6 g/dL (ref 30.0–36.0)
MCV: 95.9 fL (ref 80.0–100.0)
Platelets: 229 10*3/uL (ref 150–400)
RBC: 3.17 MIL/uL — ABNORMAL LOW (ref 3.87–5.11)
RDW: 13.5 % (ref 11.5–15.5)
WBC: 13.3 10*3/uL — ABNORMAL HIGH (ref 4.0–10.5)
nRBC: 0 % (ref 0.0–0.2)

## 2021-08-23 NOTE — Progress Notes (Signed)
[name] has arrived to transport patient to [facility]. Patient is alert to self only (baseline).  She received Tramadol 50 mg PO at 0300 for pain. Tammy [name] was contacted with update report and is expecting patient's arrival.

## 2021-08-23 NOTE — Progress Notes (Signed)
0209 Still waiting for [name] to transport patient.  6195 Patient is on the list for transport.  Still awaiting [name] ([phone]) for pick up.

## 2021-08-26 NOTE — Anesthesia Postprocedure Evaluation (Signed)
Anesthesia Post Note  Patient: [name]  Procedure(s) Performed: REMOVAL EXPOSED RIGHT ANKLE DISTAL FIBULA PLATE AND SCREWS, INCISIONAL VAC PREVENA (Right) APPLICATION OF WOUND VAC (Right)     Patient location during evaluation: PACU Anesthesia Type: General Level of consciousness: awake and alert Pain management: pain level controlled Vital Signs Assessment: post-procedure vital signs reviewed and stable Respiratory status: spontaneous breathing, nonlabored ventilation, respiratory function stable and patient connected to nasal cannula oxygen Cardiovascular status: blood pressure returned to baseline and stable Postop Assessment: no apparent nausea or vomiting Anesthetic complications: no   No notable events documented.  Last Vitals:  Vitals:   08/22/21 1517 08/22/21 2012  BP: (!) 124/57 (!) 125/51  Pulse: 96 (!) 103  Resp: 16   Temp: 37.2 C 37.6 C  SpO2: 94% 93%    Last Pain:  Vitals:   08/22/21 2012  TempSrc: Oral  PainSc:                  Tammy [name] S

## 2021-08-27 ENCOUNTER — Ambulatory Visit (INDEPENDENT_AMBULATORY_CARE_PROVIDER_SITE_OTHER): Payer: Medicare Other | Admitting: Orthopaedic Surgery

## 2021-08-27 ENCOUNTER — Encounter: Payer: Self-pay | Admitting: Orthopaedic Surgery

## 2021-08-27 ENCOUNTER — Other Ambulatory Visit: Payer: Self-pay

## 2021-08-27 VITALS — BP 112/69 | HR 87 | Resp 95

## 2021-08-27 DIAGNOSIS — Z9889 Other specified postprocedural states: Secondary | ICD-10-CM

## 2021-08-27 LAB — AEROBIC/ANAEROBIC CULTURE W GRAM STAIN (SURGICAL/DEEP WOUND)

## 2021-08-27 NOTE — Progress Notes (Signed)
Post-Op Visit Note   Patient: Tammy [name]           Date of Birth: 1940/11/08           MRN: [id number] Visit Date: 08/27/2021 PCP: Laurann Montana, MD   Assessment & Plan:VAC removed ROV 2 wks.   Start Keflex 500mg  po bid based on open wound culture.  Wound remains closed staples are intact.  No skin blistering.  Dry dressing applied with Ace wrap.  Skilled facility can Betadine swab incision daily apply 4 x 4 and then Ace wrap for daily dressing change until return in 2 weeks.  Foot elevation for swelling.  Chief Complaint:  Chief Complaint  Patient presents with   Right Ankle - Post-op Follow-up   Visit Diagnoses:  1. S/P hardware removal     Plan: ROV 2 wks.   Follow-Up Instructions: No follow-ups on file.   Orders:  No orders of the defined types were placed in this encounter.  No orders of the defined types were placed in this encounter.   Imaging: No results found.  PMFS History: Patient Active Problem List   Diagnosis Date Noted   S/P hardware removal 08/21/2021   Pressure injury of skin 11/25/2020   Adult failure to thrive    Palliative care by specialist    DNR (do not resuscitate)    Weakness generalized    Respiratory failure (HCC)    Seizure (HCC)    Seizure disorder (HCC) 06/04/2018   Status epilepticus (HCC)    Acute metabolic encephalopathy    Acute hypercapnic respiratory failure (HCC) 04/01/2018   Acute CHF (congestive heart failure) (HCC) 03/29/2018   CKD (chronic kidney disease) stage 2, GFR 60-89 ml/min 03/29/2018   Complicated UTI (urinary tract infection)    Acute confusional state 09/17/2016   Rhabdomyolysis 07/02/2016   Fall at home 07/01/2016   Lactic acid acidosis 12/01/2015   Metabolic acidosis 12/01/2015   Protein-calorie malnutrition, severe (HCC) 12/01/2015   AKI (acute kidney injury) (HCC)    Hyponatremia    Hypothermia    Tachycardia    Hyperkalemia 11/29/2015   Acute renal failure (HCC) 11/29/2015   Chronic  combined systolic and diastolic CHF, 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/2015   Mitral regurgitation 11/29/2015   Elevated lactic acid level 11/29/2015   Agitation 11/29/2015   COPD (chronic obstructive pulmonary disease) (HCC) 11/29/2015   Dementia (HCC) 11/29/2015   Confusion    Acute blood loss anemia 08/21/2015   Postoperative anemia due to acute blood loss 08/20/2015   Displaced intertrochanteric fracture of left femur (HCC) 08/19/2015   Closed left hip fracture (HCC) 08/18/2015   Secondary cardiomyopathy (HCC)    Acute systolic congestive heart failure, NYHA class 3 (HCC) 03/02/2015   COPD exacerbation (HCC) 03/02/2015   Tobacco abuse 03/02/2015   Severe protein-calorie malnutrition (HCC) 03/02/2015   HTN (hypertension) 03/02/2015   Congestive dilated cardiomyopathy (HCC)    CVA (cerebral infarction) 02/28/2015   Stroke [facility])    Past Medical History:  Diagnosis Date   CHF (congestive heart failure) (HCC)    CKD (chronic kidney disease)    COPD (chronic obstructive pulmonary disease) (HCC)    Dementia (HCC)    Hypertension    Insomnia    Seizure (HCC) 06/04/2018   possibly related to hypoglycemic event   Stroke (HCC) 02/28/2015    Family History  Problem Relation Age of Onset   Congestive Heart Failure Son 64    Past Surgical History:  Procedure Laterality  Date   APPLICATION OF WOUND VAC Right 08/21/2021   Procedure: APPLICATION OF WOUND VAC;  Surgeon: Marybelle Killings, MD;  Location: [place];  Service: Orthopedics;  Laterality: Right;   HARDWARE REMOVAL Right 08/21/2021   Procedure: REMOVAL EXPOSED RIGHT ANKLE DISTAL FIBULA PLATE AND SCREWS, INCISIONAL VAC [place];  Surgeon: Marybelle Killings, MD;  Location: [place];  Service: Orthopedics;  Laterality: Right;   INTRAMEDULLARY (IM) NAIL INTERTROCHANTERIC Left 08/19/2015   Procedure: INTRAMEDULLARY (IM) NAIL INTERTROCHANTRIC;  Surgeon: Dorna Leitz, MD;  Location: [place];  Service: Orthopedics;  Laterality: Left;   Social History    Occupational History   Occupation: retired  Tobacco Use   Smoking status: Every Day    Packs/day: 2.00    Years: 50.00    Pack years: 100.00    Types: Cigarettes   Smokeless tobacco: Never  Vaping Use   Vaping Use: Never used  Substance and Sexual Activity   Alcohol use: No   Drug use: No   Sexual activity: Not on file

## 2021-09-03 ENCOUNTER — Non-Acute Institutional Stay: Payer: Medicaid Other | Admitting: Hospice

## 2021-09-03 ENCOUNTER — Other Ambulatory Visit: Payer: Self-pay

## 2021-09-03 DIAGNOSIS — E440 Moderate protein-calorie malnutrition: Secondary | ICD-10-CM

## 2021-09-03 DIAGNOSIS — F339 Major depressive disorder, recurrent, unspecified: Secondary | ICD-10-CM

## 2021-09-03 DIAGNOSIS — Z515 Encounter for palliative care: Secondary | ICD-10-CM

## 2021-09-03 DIAGNOSIS — F03918 Unspecified dementia, unspecified severity, with other behavioral disturbance: Secondary | ICD-10-CM

## 2021-09-03 DIAGNOSIS — R451 Restlessness and agitation: Secondary | ICD-10-CM

## 2021-09-03 NOTE — Progress Notes (Signed)
Therapist, nutritional Palliative Care Consult Note Telephone: [phone]  Fax: [phone]  PATIENT NAME: Tammy [name] DOB: [birth year] MRN: [id number]  PRIMARY CARE PROVIDER:   Dr Georgann Housekeeper [facility] - Lemmie Evens NP  REFERRING PROVIDER:  Kathe Becton, NP  RESPONSIBLE PARTY:  [name]: Tammy [name] [phone] Contact Information     Name Relation Home Work Mobile   Tammy [name]  [phone]   Tammy [name] Daughter   [phone]       Visit is to build trust and highlight Palliative Medicine as specialized medical care for people living with serious illness, aimed at facilitating better quality of life through symptoms relief, assisting with advance care planning and complex medical decision making. This is a follow up visit.  RECOMMENDATIONS/PLAN:   Advance Care Planning/Code Status: Patient is a Do Not Resuscitate  Goals of Care: Goals of care include to maximize quality of life and symptom management.  Visit consisted of counseling and education dealing with the complex and emotionally intense issues of symptom management and palliative care in the setting of serious and potentially life-threatening illness. Palliative care team will continue to support patient, patient's family, and medical team.  Symptom management/Plan:  Protein caloric malnutrition:  Ongoing weight loss, currently 102 Ibs down from 113 in July/2022. Patient refuses to eat most of the time and also refuses to take her medications sometimes. In person collaboration discussion with her [facility] provider Tammy Lank NP who reports patient subsists on Boost, [name] does not want tube feeding. Her plan is to discuss possible hospice service with patient's [name] and facility as patient continues to decline in her functional status.  Continue to assist with feeding to ensure adequate oral intake. Continue enriched meals, mechanical soft texture. Boost plus  TID with meals. Initiate Mirtazapine 7.5mg  at bedtime to help boost appetite.  Aspiration precautions. Dietary consult as needed.  Depression: Continue Zoloft as ordered.  Psych consults as planned/needed. Dementia: FAST 7a, incontinent of bowel and bladder.Continue ongoing supportive care.  Agitation related to dementia managed with quetiapine and Depakote sprinkles.  Recent surgical incision in right ankle to remove harware at Ortho, follow up 09/10/2021.   Follow up: Palliative care will continue to follow for complex medical decision making, advance care planning, and clarification of goals. Return 6 weeks or prn. Encouraged to call provider sooner with any concerns.  CHIEF COMPLAINT: Palliative follow up  HISTORY OF PRESENT ILLNESS:  Tammy [name] a 80 y.o. female with multiple medical problems Dementia,  protein caloric malnutrition with ongoing weight loss, chronic CHF, hypertension, seizures, generalized anxiety disorder, protein caloric malnutrition, dysphagia. Patient denies pain/discomfort. History obtained from review of EMR, discussion with primary team, family and/or patient. Records reviewed and summarized above. All 10 point systems reviewed and are negative except as documented in history of present illness above Review and summarization of Epic records shows history from other than patient.  Palliative Care was asked to follow this patient o help address complex decision making in the context of advance care planning and goals of care clarification.   PHYSICAL EXAM  Height/weight: 5 feet 3 inches/102 Ibs down from 113 Ibs in July 2022 General: In no acute distress, appropriately dressed Cardiovascular: regular rate and rhythm; no edema in BLE Pulmonary: no cough, no increased work of breathing, normal respiratory effort Abdomen: soft, non tender, no guarding, positive bowel sounds in all quadrants GU:  no suprapubic tenderness Eyes: Normal lids, no discharge ENMT: Moist  mucous  membranes Musculoskeletal:  gets around on wheelchair mostly, moderate sarcopenia Skin: no rash to visible skin, warm without cyanosis, dressing clean dry and intact to R ankle for recent surgical removal of hardware.  Psych: non-anxious affect Neurological: Weakness but otherwise non focal Heme/lymph/immuno: no bruises, no bleeding  PERTINENT MEDICATIONS:  Outpatient Encounter Medications as of 09/03/2021  Medication Sig   acetaminophen (TYLENOL) 500 MG tablet Take 500 mg by mouth in the morning, at noon, and at bedtime. Take 1 tablet (500 mg) by mouth 3 times daily scheduled (0900, 1800 & 2100) & may take an additional tablet (500 mg) by mouth at bedtime if needed for pain.   Amino Acids-Protein Hydrolys (FEEDING SUPPLEMENT, PRO-STAT SUGAR FREE 64,) LIQD Take 30 mLs by mouth in the morning and at bedtime. (0930 & 1730)   carvedilol (COREG) 3.125 MG tablet Take 1 tablet (3.125 mg total) by mouth 2 (two) times daily with a meal. (Patient taking differently: Take 3.125 mg by mouth every morning. (0900 & 1800))   divalproex (DEPAKOTE SPRINKLE) 125 MG capsule Take 125-250 mg by mouth See admin instructions. Take 1 capsule (125 mg) by mouth at 0900, take 2 capsules (250 mg) by mouth at 1400 & take 1 capsule (125 mg) by mouth at 1800.   levETIRAcetam (KEPPRA) 250 MG tablet Take 1 tablet (250 mg total) by mouth every morning.   magnesium hydroxide (MILK OF MAGNESIA) 400 MG/5ML suspension Take 30 mLs by mouth daily as needed for mild constipation.   mirtazapine (REMERON) 7.5 MG tablet Take 7.5 mg by mouth at bedtime.   OLANZapine (ZYPREXA) 2.5 MG tablet Take 2.5 mg by mouth daily at 6 PM. (1700)   polyethylene glycol (MIRALAX / GLYCOLAX) 17 g packet Take 17 g by mouth every morning.   sertraline (ZOLOFT) 50 MG tablet Take 50 mg by mouth every morning.   traMADol (ULTRAM) 50 MG tablet Take 1 tablet (50 mg total) by mouth every 6 (six) hours as needed for moderate pain.   No facility-administered  encounter medications on file as of 09/03/2021.    HOSPICE ELIGIBILITY/DIAGNOSIS: TBD  PAST MEDICAL HISTORY:  Past Medical History:  Diagnosis Date   CHF (congestive heart failure) (HCC)    CKD (chronic kidney disease)    COPD (chronic obstructive pulmonary disease) (HCC)    Dementia (HCC)    Hypertension    Insomnia    Seizure (HCC) 06/04/2018   possibly related to hypoglycemic event   Stroke (HCC) 02/28/2015     ALLERGIES: No Known Allergies    I spent 50 minutes providing this consultation; this includes time spent with patient/family, chart review and documentation. More than 50% of the time in this consultation was spent on counseling and coordinating communication   Thank you for the opportunity to participate in the care of Tammy T Gehres Please call our office at [phone] if we can be of additional assistance.  Note: Portions of this note were generated with Scientist, clinical (histocompatibility and immunogenetics). Dictation errors may occur despite best attempts at proofreading.  Rosaura Carpenter, NP

## 2021-09-10 ENCOUNTER — Other Ambulatory Visit: Payer: Self-pay

## 2021-09-10 ENCOUNTER — Ambulatory Visit (INDEPENDENT_AMBULATORY_CARE_PROVIDER_SITE_OTHER): Payer: Medicare Other | Admitting: Orthopaedic Surgery

## 2021-09-10 DIAGNOSIS — Z9889 Other specified postprocedural states: Secondary | ICD-10-CM

## 2021-09-11 NOTE — Progress Notes (Signed)
Post-Op Visit Note   Patient: Tammy [name]           Date of Birth: 1941/06/13           MRN: [id number] Visit Date: 09/10/2021 PCP: No primary care provider on file.   Assessment & Plan: Postop hardware removal for exposed hardware right ankle.  Incision has no cellulitis no drainage sutures are intact.  She has been seen by palliative care and is getting some nutritional support.  Recheck 2 weeks.  New Ace wrap reapplied and on return in 2 weeks we will consider suture removal.   Chief Complaint:  Chief Complaint  Patient presents with   Right Ankle - Follow-up    08/21/2021 right ankle hardware removal   Visit Diagnoses: No diagnosis found.  Plan: Incision check 2 weeks.  Follow-Up Instructions: No follow-ups on file.   Orders:  No orders of the defined types were placed in this encounter.  No orders of the defined types were placed in this encounter.   Imaging: No results found.  PMFS History: Patient Active Problem List   Diagnosis Date Noted   S/P hardware removal 08/21/2021   Pressure injury of skin 11/25/2020   Adult failure to thrive    Palliative care by specialist    DNR (do not resuscitate)    Weakness generalized    Respiratory failure (HCC)    Seizure (HCC)    Seizure disorder (HCC) 06/04/2018   Status epilepticus (HCC)    Acute metabolic encephalopathy    Acute hypercapnic respiratory failure (HCC) 04/01/2018   Acute CHF (congestive heart failure) (HCC) 03/29/2018   CKD (chronic kidney disease) stage 2, GFR 60-89 ml/min 03/29/2018   Complicated UTI (urinary tract infection)    Acute confusional state 09/17/2016   Rhabdomyolysis 07/02/2016   Fall at home 07/01/2016   Lactic acid acidosis 12/01/2015   Metabolic acidosis 12/01/2015   Protein-calorie malnutrition, severe (HCC) 12/01/2015   AKI (acute kidney injury) (HCC)    Hyponatremia    Hypothermia    Tachycardia    Hyperkalemia 11/29/2015   Acute renal failure (HCC) 11/29/2015    Chronic combined systolic and diastolic CHF, 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/2015   Mitral regurgitation 11/29/2015   Elevated lactic acid level 11/29/2015   Agitation 11/29/2015   COPD (chronic obstructive pulmonary disease) (HCC) 11/29/2015   Dementia (HCC) 11/29/2015   Confusion    Acute blood loss anemia 08/21/2015   Postoperative anemia due to acute blood loss 08/20/2015   Displaced intertrochanteric fracture of left femur (HCC) 08/19/2015   Closed left hip fracture (HCC) 08/18/2015   Secondary cardiomyopathy (HCC)    Acute systolic congestive heart failure, NYHA class 3 (HCC) 03/02/2015   COPD exacerbation (HCC) 03/02/2015   Tobacco abuse 03/02/2015   Severe protein-calorie malnutrition (HCC) 03/02/2015   HTN (hypertension) 03/02/2015   Congestive dilated cardiomyopathy (HCC)    CVA (cerebral infarction) 02/28/2015   Stroke [facility])    Past Medical History:  Diagnosis Date   CHF (congestive heart failure) (HCC)    CKD (chronic kidney disease)    COPD (chronic obstructive pulmonary disease) (HCC)    Dementia (HCC)    Hypertension    Insomnia    Seizure (HCC) 06/04/2018   possibly related to hypoglycemic event   Stroke (HCC) 02/28/2015    Family History  Problem Relation Age of Onset   Congestive Heart Failure Son 64    Past Surgical History:  Procedure Laterality Date   APPLICATION OF WOUND  VAC Right 08/21/2021   Procedure: APPLICATION OF WOUND VAC;  Surgeon: Marybelle Killings, MD;  Location: [place];  Service: Orthopedics;  Laterality: Right;   HARDWARE REMOVAL Right 08/21/2021   Procedure: REMOVAL EXPOSED RIGHT ANKLE DISTAL FIBULA PLATE AND SCREWS, INCISIONAL VAC [place];  Surgeon: Marybelle Killings, MD;  Location: [place];  Service: Orthopedics;  Laterality: Right;   INTRAMEDULLARY (IM) NAIL INTERTROCHANTERIC Left 08/19/2015   Procedure: INTRAMEDULLARY (IM) NAIL INTERTROCHANTRIC;  Surgeon: Dorna Leitz, MD;  Location: [place];  Service: Orthopedics;  Laterality: Left;   Social History    Occupational History   Occupation: retired  Tobacco Use   Smoking status: Every Day    Packs/day: 2.00    Years: 50.00    Pack years: 100.00    Types: Cigarettes   Smokeless tobacco: Never  Vaping Use   Vaping Use: Never used  Substance and Sexual Activity   Alcohol use: No   Drug use: No   Sexual activity: Not on file

## 2021-09-24 ENCOUNTER — Ambulatory Visit (INDEPENDENT_AMBULATORY_CARE_PROVIDER_SITE_OTHER): Payer: Medicare Other | Admitting: Orthopaedic Surgery

## 2021-09-24 ENCOUNTER — Other Ambulatory Visit: Payer: Self-pay

## 2021-09-24 DIAGNOSIS — Z9889 Other specified postprocedural states: Secondary | ICD-10-CM

## 2021-09-24 NOTE — Progress Notes (Signed)
Post-Op Visit Note   Patient: Tammy [name]           Date of Birth: 07/05/1941           MRN: [id number] Visit Date: 09/24/2021 PCP: No primary care provider on file.   Assessment & Plan: Post hardware removal 08/21/2021.  Incision is healed she has had some Betadine and then small dressing applied.  Sutures are harvested today.  Still healing at the inferior end of the incision where small scab is present.  Continue Betadine and daily dressing change to this area.  I will check her back for final visit in 1 month.  Chief Complaint: No chief complaint on file.  Visit Diagnoses:  1. S/P hardware removal     Plan: Sutures removed .return 1 month.  Follow-Up Instructions: No follow-ups on file.   Orders:  No orders of the defined types were placed in this encounter.  No orders of the defined types were placed in this encounter.   Imaging: No results found.  PMFS History: Patient Active Problem List   Diagnosis Date Noted   S/P hardware removal 08/21/2021   Pressure injury of skin 11/25/2020   Adult failure to thrive    Palliative care by specialist    DNR (do not resuscitate)    Weakness generalized    Respiratory failure ([place])    Seizure ([place])    Seizure disorder ([place]) 06/04/2018   Status epilepticus ([place])    Acute metabolic encephalopathy    Acute hypercapnic respiratory failure ([place]) 04/01/2018   Acute CHF (congestive heart failure) ([place]) 03/29/2018   CKD (chronic kidney disease) stage 2, GFR 60-89 ml/min [id number]   Complicated UTI (urinary tract infection)    Acute confusional state 09/17/2016   Rhabdomyolysis 07/02/2016   Fall at home 07/01/2016   Lactic acid acidosis 99991111   Metabolic acidosis 99991111   Protein-calorie malnutrition, severe ([place]) 12/01/2015   AKI (acute kidney injury) ([place])    Hyponatremia    Hypothermia    Tachycardia    Hyperkalemia 11/29/2015   Acute renal failure ([place]) 11/29/2015   Chronic combined systolic and  diastolic CHF, NYHA class 2 ([place]) 11/29/2015   Mitral regurgitation 11/29/2015   Elevated lactic acid level 11/29/2015   Agitation 11/29/2015   COPD (chronic obstructive pulmonary disease) ([place]) 11/29/2015   Dementia ([place]) 11/29/2015   Confusion    Acute blood loss anemia 08/21/2015   Postoperative anemia due to acute blood loss 08/20/2015   Displaced intertrochanteric fracture of left femur ([place]) 08/19/2015   Closed left hip fracture ([place]) 08/18/2015   Secondary cardiomyopathy ([place])    Acute systolic congestive heart failure, NYHA class 3 ([place]) 03/02/2015   COPD exacerbation ([place]) 03/02/2015   Tobacco abuse 03/02/2015   Severe protein-calorie malnutrition ([place]) 03/02/2015   HTN (hypertension) 03/02/2015   Congestive dilated cardiomyopathy (HCC)    CVA (cerebral infarction) 02/28/2015   Stroke [facility])    Past Medical History:  Diagnosis Date   CHF (congestive heart failure) (HCC)    CKD (chronic kidney disease)    COPD (chronic obstructive pulmonary disease) (HCC)    Dementia ([place])    Hypertension    Insomnia    Seizure ([place]) 06/04/2018   possibly related to hypoglycemic event   Stroke ([place]) 02/28/2015    Family History  Problem Relation Age of Onset   Congestive Heart Failure Son 42    Past Surgical History:  Procedure Laterality Date   APPLICATION OF WOUND VAC Right 08/21/2021  Procedure: APPLICATION OF WOUND VAC;  Surgeon: [name], MD;  Location: [facility] OR;  Service: Orthopedics;  Laterality: Right;   HARDWARE REMOVAL Right 08/21/2021   Procedure: REMOVAL EXPOSED RIGHT ANKLE DISTAL FIBULA PLATE AND SCREWS, INCISIONAL VAC PREVENA;  Surgeon: [name], MD;  Location: [facility] OR;  Service: Orthopedics;  Laterality: Right;   INTRAMEDULLARY (IM) NAIL INTERTROCHANTERIC Left 08/19/2015   Procedure: INTRAMEDULLARY (IM) NAIL INTERTROCHANTRIC;  Surgeon: Jodi Geralds, MD;  Location: [facility] OR;  Service: Orthopedics;  Laterality: Left;   Social History   Occupational History    Occupation: retired  Tobacco Use   Smoking status: Every Day    Packs/day: 2.00    Years: 50.00    Pack years: 100.00    Types: Cigarettes   Smokeless tobacco: Never  Vaping Use   Vaping Use: Never used  Substance and Sexual Activity   Alcohol use: No   Drug use: No   Sexual activity: Not on file

## 2021-10-25 ENCOUNTER — Other Ambulatory Visit: Payer: Self-pay

## 2021-10-25 ENCOUNTER — Encounter: Payer: Self-pay | Admitting: Orthopaedic Surgery

## 2021-10-25 ENCOUNTER — Ambulatory Visit (INDEPENDENT_AMBULATORY_CARE_PROVIDER_SITE_OTHER): Payer: Medicare Other | Admitting: Orthopaedic Surgery

## 2021-10-25 DIAGNOSIS — Z9889 Other specified postprocedural states: Secondary | ICD-10-CM

## 2021-10-25 NOTE — Progress Notes (Signed)
Post-Op Visit Note   Patient: Tammy [name]           Date of Birth: 28-May-1941           MRN: [id number] Visit Date: 10/25/2021 PCP: No primary care provider on file.   Assessment & Plan:post op hardware removal right fibula Nov 2022.   Chief Complaint:  Chief Complaint  Patient presents with   Right Ankle - Follow-up    08/21/2021 right ankle hardware removal   Visit Diagnoses:  1. S/P hardware removal     Plan: Post hardware removal right fibula from November.  They have been doing dressing change with Betadine.  Trace drainage on the dressing from yesterday with Betadine spot.  No cellulitis no tenderness small eschar present.  We will release her from care they can change to dry dressing every other day for 2 weeks and then stop.  Patient had plate removed since hardware was protruding through the skin.  She can return as needed.  Follow-Up Instructions: No follow-ups on file.   Orders:  No orders of the defined types were placed in this encounter.  No orders of the defined types were placed in this encounter.   Imaging: No results found.  PMFS History: Patient Active Problem List   Diagnosis Date Noted   S/P hardware removal 08/21/2021   Pressure injury of skin 11/25/2020   Adult failure to thrive    Palliative care by specialist    DNR (do not resuscitate)    Weakness generalized    Respiratory failure ([place])    Seizure ([place])    Seizure disorder ([place]) 06/04/2018   Status epilepticus ([place])    Acute metabolic encephalopathy    Acute hypercapnic respiratory failure ([place]) 04/01/2018   Acute CHF (congestive heart failure) ([place]) 03/29/2018   CKD (chronic kidney disease) stage 2, GFR 60-89 ml/min [id number]   Complicated UTI (urinary tract infection)    Acute confusional state 09/17/2016   Rhabdomyolysis 07/02/2016   Fall at home 07/01/2016   Lactic acid acidosis 99991111   Metabolic acidosis 99991111   Protein-calorie malnutrition, severe ([place])  12/01/2015   AKI (acute kidney injury) ([place])    Hyponatremia    Hypothermia    Tachycardia    Hyperkalemia 11/29/2015   Acute renal failure ([place]) 11/29/2015   Chronic combined systolic and diastolic CHF, NYHA class 2 ([place]) 11/29/2015   Mitral regurgitation 11/29/2015   Elevated lactic acid level 11/29/2015   Agitation 11/29/2015   COPD (chronic obstructive pulmonary disease) ([place]) 11/29/2015   Dementia ([place]) 11/29/2015   Confusion    Acute blood loss anemia 08/21/2015   Postoperative anemia due to acute blood loss 08/20/2015   Displaced intertrochanteric fracture of left femur ([place]) 08/19/2015   Closed left hip fracture ([place]) 08/18/2015   Secondary cardiomyopathy ([place])    Acute systolic congestive heart failure, NYHA class 3 ([place]) 03/02/2015   COPD exacerbation ([place]) 03/02/2015   Tobacco abuse 03/02/2015   Severe protein-calorie malnutrition ([place]) 03/02/2015   HTN (hypertension) 03/02/2015   Congestive dilated cardiomyopathy ([place])    CVA (cerebral infarction) 02/28/2015   Stroke [facility])    Past Medical History:  Diagnosis Date   CHF (congestive heart failure) (HCC)    CKD (chronic kidney disease)    COPD (chronic obstructive pulmonary disease) ([place])    Dementia ([place])    Hypertension    Insomnia    Seizure ([place]) 06/04/2018   possibly related to hypoglycemic event   Stroke ([place]) 02/28/2015  Family History  Problem Relation Age of Onset   Congestive Heart Failure Son 33    Past Surgical History:  Procedure Laterality Date   APPLICATION OF WOUND VAC Right 08/21/2021   Procedure: APPLICATION OF WOUND VAC;  Surgeon: Marybelle Killings, MD;  Location: [place];  Service: Orthopedics;  Laterality: Right;   HARDWARE REMOVAL Right 08/21/2021   Procedure: REMOVAL EXPOSED RIGHT ANKLE DISTAL FIBULA PLATE AND SCREWS, INCISIONAL VAC [place];  Surgeon: Marybelle Killings, MD;  Location: [place];  Service: Orthopedics;  Laterality: Right;   INTRAMEDULLARY (IM) NAIL INTERTROCHANTERIC Left 08/19/2015    Procedure: INTRAMEDULLARY (IM) NAIL INTERTROCHANTRIC;  Surgeon: Dorna Leitz, MD;  Location: [place];  Service: Orthopedics;  Laterality: Left;   Social History   Occupational History   Occupation: retired  Tobacco Use   Smoking status: Every Day    Packs/day: 2.00    Years: 50.00    Pack years: 100.00    Types: Cigarettes   Smokeless tobacco: Never  Vaping Use   Vaping Use: Never used  Substance and Sexual Activity   Alcohol use: No   Drug use: No   Sexual activity: Not on file

## 2021-11-08 ENCOUNTER — Other Ambulatory Visit: Payer: Self-pay

## 2021-11-08 ENCOUNTER — Non-Acute Institutional Stay: Payer: Medicare Other | Admitting: Hospice

## 2021-11-08 DIAGNOSIS — R451 Restlessness and agitation: Secondary | ICD-10-CM

## 2021-11-08 DIAGNOSIS — Z515 Encounter for palliative care: Secondary | ICD-10-CM

## 2021-11-08 DIAGNOSIS — F339 Major depressive disorder, recurrent, unspecified: Secondary | ICD-10-CM

## 2021-11-08 DIAGNOSIS — F03918 Unspecified dementia, unspecified severity, with other behavioral disturbance: Secondary | ICD-10-CM

## 2021-11-08 NOTE — Progress Notes (Signed)
Therapist, nutritional Palliative Care Consult Note Telephone: [phone]  Fax: [phone]  PATIENT NAME: Tammy [name] DOB: 06-26-1941 MRN: [id number]  PRIMARY CARE PROVIDER:   Dr Georgann Housekeeper [facility] - Lemmie Evens NP  REFERRING PROVIDER:  Kathe Becton, NP  RESPONSIBLE PARTY:  Son: Nicoya Friel [phone] Contact Information     Name Relation Home Work Mobile   Lampi,Michael Debby Bud) Son  [phone]   Sarris,Victoria Daughter   [phone]       Visit is to build trust and highlight Palliative Medicine as specialized medical care for people living with serious illness, aimed at facilitating better quality of life through symptoms relief, assisting with advance care planning and complex medical decision making. This is a follow up visit.  RECOMMENDATIONS/PLAN:   Advance Care Planning/Code Status: Patient is a Do Not Resuscitate  Goals of Care: Goals of care include to maximize quality of life and symptom management.  Palliative care team will continue to support patient, patient's family, and medical team.  Symptom management/Plan:  Protein caloric malnutrition: Current weight is 102.2 pounds 10/15/2021 up from 101.5 pounds in December 2022.  Appetite improved since started on mirtazapine continue mirtazapine as ordered.  Continue to offer assistance during meals to ensure adequate oral intake.   Continue enriched meals, mechanical soft texture. Boost plus TID with meals. Aspiration precautions. Dietary consult as needed.  Depression: Managed with Zoloft.  Get out of bed and encourage participation in psych consults as planned/needed. Dementia: FAST 7a, incontinent of bowel and bladder.Continue ongoing supportive care.  Agitation related to dementia managed with quetiapine and Depakote sprinkles.  Recent surgical incision in right ankle to remove harware at Ortho,-successful incision site healing. Follow up: Palliative  care will continue to follow for complex medical decision making, advance care planning, and clarification of goals. Return 6 weeks or prn. Encouraged to call provider sooner with any concerns.  CHIEF COMPLAINT: Palliative follow up  HISTORY OF PRESENT ILLNESS:  Tammy [name] a 81 y.o. female with multiple medical problems Dementia,  protein caloric malnutrition with ongoing weight loss, chronic CHF, hypertension, seizures, generalized anxiety disorder, protein caloric malnutrition, dysphagia. Patient denies pain/discomfort.  [facility] NP with no concerns today.  History obtained from review of EMR, discussion with primary team, family and/or patient. Records reviewed and summarized above. All 10 point systems reviewed and are negative except as documented in history of present illness above Review and summarization of Epic records shows history from other than patient.  Palliative Care was asked to follow this patient o help address complex decision making in the context of advance care planning and goals of care clarification.   PHYSICAL EXAM  Height/weight: 5 feet 3 inches/102.2 Ibs down from 113 Ibs in July 2022 General: In no acute distress, appropriately dressed Cardiovascular: regular rate and rhythm; no edema in BLE Pulmonary: no cough, no increased work of breathing, normal respiratory effort Abdomen: soft, non tender, no guarding, positive bowel sounds in all quadrants GU:  no suprapubic tenderness Eyes: Normal lids, no discharge ENMT: Moist mucous membranes Musculoskeletal:  gets around on wheelchair mostly, moderate sarcopenia Skin: no rash to visible skin, warm without cyanosis Psych: non-anxious affect Neurological: Weakness but otherwise non focal Heme/lymph/immuno: no bruises, no bleeding  PERTINENT MEDICATIONS:  Outpatient Encounter Medications as of 11/08/2021  Medication Sig   acetaminophen (TYLENOL) 500 MG tablet Take 500 mg by mouth in the morning, at noon, and at  bedtime. Take 1 tablet (500 mg)  by mouth 3 times daily scheduled (0900, 1800 & 2100) & may take an additional tablet (500 mg) by mouth at bedtime if needed for pain.   Amino Acids-Protein Hydrolys (FEEDING SUPPLEMENT, PRO-STAT SUGAR FREE 64,) LIQD Take 30 mLs by mouth in the morning and at bedtime. (0930 & 1730)   carvedilol (COREG) 3.125 MG tablet Take 1 tablet (3.125 mg total) by mouth 2 (two) times daily with a meal. (Patient taking differently: Take 3.125 mg by mouth every morning. (0900 & 1800))   divalproex (DEPAKOTE SPRINKLE) 125 MG capsule Take 125-250 mg by mouth See admin instructions. Take 1 capsule (125 mg) by mouth at 0900, take 2 capsules (250 mg) by mouth at 1400 & take 1 capsule (125 mg) by mouth at 1800.   levETIRAcetam (KEPPRA) 250 MG tablet Take 1 tablet (250 mg total) by mouth every morning.   magnesium hydroxide (MILK OF MAGNESIA) 400 MG/5ML suspension Take 30 mLs by mouth daily as needed for mild constipation.   mirtazapine (REMERON) 7.5 MG tablet Take 7.5 mg by mouth at bedtime.   OLANZapine (ZYPREXA) 2.5 MG tablet Take 2.5 mg by mouth daily at 6 PM. (1700)   polyethylene glycol (MIRALAX / GLYCOLAX) 17 g packet Take 17 g by mouth every morning.   sertraline (ZOLOFT) 50 MG tablet Take 50 mg by mouth every morning.   traMADol (ULTRAM) 50 MG tablet Take 1 tablet (50 mg total) by mouth every 6 (six) hours as needed for moderate pain.   No facility-administered encounter medications on file as of 11/08/2021.    HOSPICE ELIGIBILITY/DIAGNOSIS: TBD  PAST MEDICAL HISTORY:  Past Medical History:  Diagnosis Date   CHF (congestive heart failure) (HCC)    CKD (chronic kidney disease)    COPD (chronic obstructive pulmonary disease) (HCC)    Dementia (HCC)    Hypertension    Insomnia    Seizure (HCC) 06/04/2018   possibly related to hypoglycemic event   Stroke (HCC) 02/28/2015     ALLERGIES: No Known Allergies    I spent 45 minutes providing this consultation; this includes  time spent with patient/family, chart review and documentation. More than 50% of the time in this consultation was spent on counseling and coordinating communication   Thank you for the opportunity to participate in the care of Tammy T Bizzarro Please call our office at [phone] if we can be of additional assistance.  Note: Portions of this note were generated with Scientist, clinical (histocompatibility and immunogenetics). Dictation errors may occur despite best attempts at proofreading.  Rosaura Carpenter, NP

## 2022-06-06 DEATH — deceased
